# Patient Record
Sex: Male | Born: 1985 | Race: White | Hispanic: No | Marital: Single | Smoking: Never smoker
Health system: Southern US, Community
[De-identification: ages and names within clinical notes are randomized; demographics above are authoritative.]

## PROBLEM LIST (undated history)

## (undated) DIAGNOSIS — Z8619 Personal history of other infectious and parasitic diseases: Secondary | ICD-10-CM

## (undated) DIAGNOSIS — K921 Melena: Secondary | ICD-10-CM

## (undated) DIAGNOSIS — R1084 Generalized abdominal pain: Secondary | ICD-10-CM

## (undated) DIAGNOSIS — F419 Anxiety disorder, unspecified: Secondary | ICD-10-CM

## (undated) DIAGNOSIS — A0811 Acute gastroenteropathy due to Norwalk agent: Secondary | ICD-10-CM

## (undated) DIAGNOSIS — F329 Major depressive disorder, single episode, unspecified: Secondary | ICD-10-CM

## (undated) DIAGNOSIS — K604 Rectal fistula, unspecified: Secondary | ICD-10-CM

## (undated) DIAGNOSIS — D509 Iron deficiency anemia, unspecified: Secondary | ICD-10-CM

## (undated) DIAGNOSIS — K5090 Crohn's disease, unspecified, without complications: Secondary | ICD-10-CM

## (undated) DIAGNOSIS — A419 Sepsis, unspecified organism: Secondary | ICD-10-CM

## (undated) DIAGNOSIS — K8020 Calculus of gallbladder without cholecystitis without obstruction: Secondary | ICD-10-CM

## (undated) DIAGNOSIS — F32A Depression, unspecified: Secondary | ICD-10-CM

## (undated) DIAGNOSIS — J45909 Unspecified asthma, uncomplicated: Secondary | ICD-10-CM

## (undated) DIAGNOSIS — K824 Cholesterolosis of gallbladder: Secondary | ICD-10-CM

## (undated) DIAGNOSIS — K611 Rectal abscess: Secondary | ICD-10-CM

## (undated) HISTORY — DX: Crohn's disease, unspecified, without complications: K50.90

## (undated) HISTORY — DX: Iron deficiency anemia, unspecified: D50.9

## (undated) HISTORY — DX: Depression, unspecified: F32.A

## (undated) HISTORY — PX: COLON SURGERY: SHX602

## (undated) HISTORY — DX: Generalized abdominal pain: R10.84

## (undated) HISTORY — DX: Cholesterolosis of gallbladder: K82.4

## (undated) HISTORY — DX: Major depressive disorder, single episode, unspecified: F32.9

## (undated) HISTORY — DX: Personal history of other infectious and parasitic diseases: Z86.19

## (undated) HISTORY — DX: Acute gastroenteropathy due to Norwalk agent: A08.11

## (undated) HISTORY — PX: WRIST SURGERY: SHX841

## (undated) HISTORY — DX: Melena: K92.1

## (undated) HISTORY — PX: COLON RESECTION: SHX5231

## (undated) HISTORY — DX: Calculus of gallbladder without cholecystitis without obstruction: K80.20

## (undated) HISTORY — PX: OTHER SURGICAL HISTORY: SHX169

## (undated) HISTORY — DX: Rectal abscess: K61.1

## (undated) HISTORY — DX: Sepsis, unspecified organism: A41.9

---

## 2008-03-03 ENCOUNTER — Inpatient Hospital Stay: Payer: Self-pay | Admitting: Specialist

## 2008-03-20 ENCOUNTER — Ambulatory Visit: Payer: Self-pay | Admitting: Gastroenterology

## 2008-09-08 ENCOUNTER — Ambulatory Visit: Payer: Self-pay | Admitting: Internal Medicine

## 2008-09-13 ENCOUNTER — Ambulatory Visit: Payer: Self-pay | Admitting: Internal Medicine

## 2008-10-09 ENCOUNTER — Ambulatory Visit: Payer: Self-pay | Admitting: Internal Medicine

## 2008-11-08 ENCOUNTER — Ambulatory Visit: Payer: Self-pay | Admitting: Internal Medicine

## 2008-12-09 ENCOUNTER — Ambulatory Visit: Payer: Self-pay | Admitting: Internal Medicine

## 2009-01-14 ENCOUNTER — Ambulatory Visit: Payer: Self-pay | Admitting: Gastroenterology

## 2009-01-15 ENCOUNTER — Ambulatory Visit: Payer: Self-pay | Admitting: Gastroenterology

## 2009-01-27 ENCOUNTER — Ambulatory Visit: Payer: Self-pay | Admitting: Gastroenterology

## 2009-04-15 ENCOUNTER — Emergency Department: Payer: Self-pay | Admitting: Emergency Medicine

## 2009-04-25 ENCOUNTER — Emergency Department: Payer: Self-pay | Admitting: Emergency Medicine

## 2009-09-17 ENCOUNTER — Ambulatory Visit: Payer: Self-pay | Admitting: Gastroenterology

## 2009-09-19 LAB — PATHOLOGY REPORT

## 2009-10-16 ENCOUNTER — Inpatient Hospital Stay: Payer: Self-pay | Admitting: Internal Medicine

## 2009-12-02 ENCOUNTER — Ambulatory Visit: Payer: Self-pay | Admitting: Surgery

## 2009-12-08 ENCOUNTER — Inpatient Hospital Stay: Payer: Self-pay | Admitting: Surgery

## 2010-05-30 ENCOUNTER — Inpatient Hospital Stay: Payer: Self-pay | Admitting: Surgery

## 2010-06-29 ENCOUNTER — Ambulatory Visit: Payer: Self-pay | Admitting: Gastroenterology

## 2010-07-01 LAB — PATHOLOGY REPORT

## 2010-07-19 ENCOUNTER — Inpatient Hospital Stay: Payer: Self-pay | Admitting: Unknown Physician Specialty

## 2011-01-14 ENCOUNTER — Emergency Department: Payer: Self-pay | Admitting: Emergency Medicine

## 2011-04-20 ENCOUNTER — Emergency Department: Payer: Self-pay | Admitting: Emergency Medicine

## 2011-04-20 LAB — COMPREHENSIVE METABOLIC PANEL
Albumin: 4.6 g/dL (ref 3.4–5.0)
Anion Gap: 12 (ref 7–16)
Chloride: 106 mmol/L (ref 98–107)
Co2: 24 mmol/L (ref 21–32)
EGFR (African American): >60
EGFR (Non-African Amer.): >60
Osmolality: 283 (ref 275–301)
Potassium: 4.2 mmol/L (ref 3.5–5.1)
SGOT(AST): 21 U/L (ref 15–37)
SGPT (ALT): 17 U/L
Sodium: 142 mmol/L (ref 136–145)
Total Protein: 7.9 g/dL (ref 6.4–8.2)

## 2011-04-20 LAB — URINALYSIS, COMPLETE
Bacteria: NONE SEEN
Blood: NEGATIVE
Nitrite: NEGATIVE
Ph: 6 (ref 4.5–8.0)
Protein: NEGATIVE
RBC,UR: <1 /HPF (ref 0–5)
Specific Gravity: 1.029 (ref 1.003–1.030)
WBC UR: 1 /HPF (ref 0–5)

## 2011-04-20 LAB — CBC
HCT: 47.3 % (ref 40.0–52.0)
MCH: 31 pg (ref 26.0–34.0)
MCHC: 34.5 g/dL (ref 32.0–36.0)
MCV: 90 fL (ref 80–100)
Platelet: 187 10*3/uL (ref 150–440)
RBC: 5.27 10*6/uL (ref 4.40–5.90)
RDW: 11.9 % (ref 11.5–14.5)
WBC: 8.8 10*3/uL (ref 3.8–10.6)

## 2012-05-26 ENCOUNTER — Emergency Department: Payer: Self-pay | Admitting: Emergency Medicine

## 2012-05-26 LAB — CBC
HGB: 9 g/dL — ABNORMAL LOW (ref 13.0–18.0)
MCV: 69 fL — ABNORMAL LOW (ref 80–100)
RBC: 4.21 10*6/uL — ABNORMAL LOW (ref 4.40–5.90)
RDW: 15.4 % — ABNORMAL HIGH (ref 11.5–14.5)

## 2012-06-29 ENCOUNTER — Other Ambulatory Visit: Payer: Self-pay | Admitting: Family

## 2012-06-29 LAB — CBC WITH DIFFERENTIAL/PLATELET
Basophil #: 0.1 10*3/uL (ref 0.0–0.1)
Basophil %: 1.1 %
Eosinophil #: 0.3 10*3/uL (ref 0.0–0.7)
Eosinophil %: 5.7 %
HGB: 12.3 g/dL — ABNORMAL LOW (ref 13.0–18.0)
MCH: 24.1 pg — ABNORMAL LOW (ref 26.0–34.0)
MCHC: 31.8 g/dL — ABNORMAL LOW (ref 32.0–36.0)
MCV: 76 fL — ABNORMAL LOW (ref 80–100)
Monocyte #: 0.6 x10 3/mm (ref 0.2–1.0)
Neutrophil %: 70.9 %
Platelet: 235 10*3/uL (ref 150–440)
RDW: 26.7 % — ABNORMAL HIGH (ref 11.5–14.5)
WBC: 6 10*3/uL (ref 3.8–10.6)

## 2012-06-29 LAB — IRON AND TIBC
Iron Bind.Cap.(Total): 443 ug/dL (ref 250–450)
Iron Saturation: 62 %
Unbound Iron-Bind.Cap.: 167 ug/dL

## 2012-07-20 ENCOUNTER — Ambulatory Visit: Payer: Self-pay | Admitting: Gastroenterology

## 2013-03-14 ENCOUNTER — Emergency Department: Payer: Self-pay | Admitting: Emergency Medicine

## 2013-03-14 LAB — URINALYSIS, COMPLETE
Bilirubin,UR: NEGATIVE
Blood: NEGATIVE
GLUCOSE, UR: NEGATIVE mg/dL (ref 0–75)
LEUKOCYTE ESTERASE: NEGATIVE
NITRITE: NEGATIVE
PROTEIN: NEGATIVE
Ph: 5 (ref 4.5–8.0)
RBC,UR: 2 /HPF (ref 0–5)
SPECIFIC GRAVITY: 1.019 (ref 1.003–1.030)
Squamous Epithelial: NONE SEEN

## 2013-03-14 LAB — COMPREHENSIVE METABOLIC PANEL
ANION GAP: 4 — AB (ref 7–16)
AST: 27 U/L (ref 15–37)
Albumin: 4.7 g/dL (ref 3.4–5.0)
Alkaline Phosphatase: 107 U/L
BUN: 12 mg/dL (ref 7–18)
Bilirubin,Total: 1.5 mg/dL — ABNORMAL HIGH (ref 0.2–1.0)
CHLORIDE: 106 mmol/L (ref 98–107)
CO2: 26 mmol/L (ref 21–32)
Calcium, Total: 9.6 mg/dL (ref 8.5–10.1)
Creatinine: 0.94 mg/dL (ref 0.60–1.30)
Glucose: 115 mg/dL — ABNORMAL HIGH (ref 65–99)
OSMOLALITY: 273 (ref 275–301)
POTASSIUM: 3.6 mmol/L (ref 3.5–5.1)
SGPT (ALT): 24 U/L (ref 12–78)
Sodium: 136 mmol/L (ref 136–145)
Total Protein: 8.6 g/dL — ABNORMAL HIGH (ref 6.4–8.2)

## 2013-03-14 LAB — CBC WITH DIFFERENTIAL/PLATELET
Basophil #: 0.1 10*3/uL (ref 0.0–0.1)
Basophil %: 0.2 %
Eosinophil #: 0.2 10*3/uL (ref 0.0–0.7)
Eosinophil %: 0.7 %
HCT: 49.2 % (ref 40.0–52.0)
HGB: 17.1 g/dL (ref 13.0–18.0)
LYMPHS ABS: 0.1 10*3/uL — AB (ref 1.0–3.6)
LYMPHS PCT: 0.4 %
MCH: 30.6 pg (ref 26.0–34.0)
MCHC: 34.6 g/dL (ref 32.0–36.0)
MCV: 88 fL (ref 80–100)
Monocyte #: 1.2 x10 3/mm — ABNORMAL HIGH (ref 0.2–1.0)
Monocyte %: 5.2 %
Neutrophil #: 21.4 10*3/uL — ABNORMAL HIGH (ref 1.4–6.5)
Neutrophil %: 93.5 %
Platelet: 231 10*3/uL (ref 150–440)
RBC: 5.57 10*6/uL (ref 4.40–5.90)
RDW: 13.7 % (ref 11.5–14.5)
WBC: 22.9 10*3/uL — AB (ref 3.8–10.6)

## 2013-03-14 LAB — LIPASE, BLOOD: LIPASE: 140 U/L (ref 73–393)

## 2013-06-06 ENCOUNTER — Emergency Department: Payer: Self-pay | Admitting: Emergency Medicine

## 2013-06-08 LAB — BETA STREP CULTURE(ARMC)

## 2013-07-26 ENCOUNTER — Ambulatory Visit: Payer: Self-pay | Admitting: Gastroenterology

## 2013-07-27 LAB — PATHOLOGY REPORT

## 2013-12-24 ENCOUNTER — Emergency Department: Payer: Self-pay | Admitting: Emergency Medicine

## 2013-12-24 LAB — CBC
HCT: 43.3 % (ref 40.0–52.0)
HGB: 14.8 g/dL (ref 13.0–18.0)
MCH: 30.7 pg (ref 26.0–34.0)
MCHC: 34.2 g/dL (ref 32.0–36.0)
MCV: 90 fL (ref 80–100)
Platelet: 205 10*3/uL (ref 150–440)
RBC: 4.82 10*6/uL (ref 4.40–5.90)
RDW: 14.3 % (ref 11.5–14.5)
WBC: 9.2 10*3/uL (ref 3.8–10.6)

## 2013-12-24 LAB — COMPREHENSIVE METABOLIC PANEL
AST: 22 U/L (ref 15–37)
Albumin: 3.9 g/dL (ref 3.4–5.0)
Alkaline Phosphatase: 84 U/L
Anion Gap: 8 (ref 7–16)
BUN: 7 mg/dL (ref 7–18)
Bilirubin,Total: 1.6 mg/dL — ABNORMAL HIGH (ref 0.2–1.0)
CALCIUM: 8.2 mg/dL — AB (ref 8.5–10.1)
CHLORIDE: 106 mmol/L (ref 98–107)
CREATININE: 0.93 mg/dL (ref 0.60–1.30)
Co2: 26 mmol/L (ref 21–32)
EGFR (African American): >60
EGFR (Non-African Amer.): >60
Glucose: 84 mg/dL (ref 65–99)
Osmolality: 277 (ref 275–301)
Potassium: 3.6 mmol/L (ref 3.5–5.1)
SGPT (ALT): 21 U/L
Sodium: 140 mmol/L (ref 136–145)
TOTAL PROTEIN: 7.3 g/dL (ref 6.4–8.2)

## 2014-06-01 NOTE — Consult Note (Signed)
Brief Consult Note: Diagnosis: rt buttock fistula. crohn's disease.   Patient was seen by consultant.   Recommend further assessment or treatment.   Discussed with Attending MD.   Comments: No fluctuance, no erythema, min tenderness rt buttock over old fistula scar. Rec abx for now, may need I&D in 2-3 days if fails to resolve.  Electronic Signatures: Florene Glen (MD)  (Signed [id number] 16:01)  Authored: Brief Consult Note   Last Updated: 16-Nov-15 16:01 by Florene Glen (MD)

## 2014-06-21 ENCOUNTER — Emergency Department
Admission: EM | Admit: 2014-06-21 | Discharge: 2014-06-21 | Disposition: A | Discharge status: Home / Self Care | Payer: BLUE CROSS/BLUE SHIELD | Attending: Emergency Medicine | Admitting: Emergency Medicine

## 2014-06-21 ENCOUNTER — Encounter: Payer: Self-pay | Admitting: Emergency Medicine

## 2014-06-21 DIAGNOSIS — B029 Zoster without complications: Secondary | ICD-10-CM | POA: Diagnosis not present

## 2014-06-21 DIAGNOSIS — R21 Rash and other nonspecific skin eruption: Secondary | ICD-10-CM | POA: Diagnosis present

## 2014-06-21 HISTORY — DX: Crohn's disease, unspecified, without complications: K50.90

## 2014-06-21 MED ORDER — ACYCLOVIR 800 MG PO TABS: 800.0000 mg | ORAL_TABLET | Freq: Every day | ORAL | Status: DC

## 2014-06-21 MED ORDER — TRAMADOL HCL 50 MG PO TABS: 50.0000 mg | ORAL_TABLET | Freq: Three times a day (TID) | ORAL | Status: DC | PRN

## 2014-06-21 MED ORDER — PREDNISONE 10 MG (21) PO TBPK: 10.0000 mg | ORAL_TABLET | Freq: Every day | ORAL | Status: DC

## 2014-06-21 NOTE — ED Provider Notes (Signed)
[facility] Emergency Department Provider Note  ____________________________________________  Time seen: Approximately 1:21 PM  I have reviewed the triage vital signs and the nursing notes.   HISTORY  Chief Complaint Rash  HPI Leroy Hernandez is a 29 y.o. male presents to the ER with complaints of rash to right face. Patient states began approximate 4-5 days ago with small clustered area. States has gradually progressed and spread on face. Patient denies itching but states that it is painful. States pain at 4 out of 10 described as aching.  Described as "feels like a bruise" and it burns. Patient states that he came to the ER today as it has worsened and closer to his eye. States concerned it may be in his eye as right eye "feels irritated." Denies changes in vision, eye pain, eye discharge. Denies changes in lotions, chemicals, medications, detergents or other contacts. Denies potential for foreign body to right eye.   Denies fevers, sore throat, mouth soreness, shortness of breath, chest pain or abdominal pain.  Past Medical History  Diagnosis Date  . Crohn's disease   States no prednisone in last year.  There are no active problems to display for this patient.   History reviewed. No pertinent past surgical history.  No current outpatient medications to display for this patient. Allergies Review of patient's allergies indicates no known allergies.  History reviewed. No pertinent family history.  Social History History  Substance Use Topics  . Smoking status: Never Smoker   . Smokeless tobacco: Not on file  . Alcohol Use: No    Review of Systems Constitutional: No fever/chills Eyes: No visual changes. ENT: No sore throat. Cardiovascular: Denies chest pain. Respiratory: Denies shortness of breath. Gastrointestinal: No abdominal pain.  No nausea, no vomiting.  No diarrhea.  No constipation. Genitourinary: Negative for dysuria. Musculoskeletal:  Negative for back pain. Skin: Positive for rash Neurological: Negative for headaches, focal weakness or numbness.  10-point ROS otherwise negative.  ____________________________________________   PHYSICAL EXAM:  VITAL SIGNS: ED Triage Vitals  Enc Vitals Group     BP 06/21/14 1124 122/73 mmHg     Pulse Rate 06/21/14 1124 93     Resp 06/21/14 1124 18     Temp 06/21/14 1124 98.4 F (36.9 C)     Temp Source 06/21/14 1124 Oral     SpO2 06/21/14 1124 99 %     Weight 06/21/14 1124 130 lb (58.968 kg)     Height 06/21/14 1124 5' 5"  (1.651 m)     Head Cir --      Peak Flow --      Pain Score 06/21/14 1124 4     Pain Loc --      Pain Edu? --      Excl. in [place]? --    Visual acuity completed by RN:  Right 20/100 Left:20/50  Constitutional: Alert and oriented. Well appearing and in no acute distress. Eyes: Conjunctivae are normal. PERRL. EOMI. No drainage or erythema.  Head: Atraumatic. Nose: No congestion/rhinnorhea. Mouth/Throat: Mucous membranes are moist.  Oropharynx non-erythematous. Neck: No stridor.  No cervical spine tenderness to palpation Hematological/Lymphatic/Immunilogical: No cervical lymphadenopathy. Cardiovascular: Normal rate, regular rhythm. Grossly normal heart sounds.  Good peripheral circulation. Respiratory: Normal respiratory effort.  No retractions. Lungs CTAB. Gastrointestinal: Soft and nontender. No distention. No abdominal bruits. No CVA tenderness. Musculoskeletal: No lower extremity tenderness nor edema.  No joint effusions. Neurologic:  Normal speech and language. No gross focal neurologic deficits are appreciated. Speech  is normal. No gait instability. Skin:  Unilateral vesicular mildly erythematous clustered rash present to right forhead with scattered vesicles at right eyebrow. Minimally tender to palpation.   No lesions noted to eyelid or below right eye. No rash present to left face. No other rash noted. No surrounding erythema. No induration or  fluctuance. Psychiatric: Mood and affect are normal. Speech and behavior are normal.  _________________________________________   INITIAL IMPRESSION / ASSESSMENT AND PLAN / ED COURSE  Pertinent labs & imaging results that were available during my care of the patient were reviewed by me and considered in my medical decision making (see chart for details).  Patient presents to the ER with unilateral right facial rash present 4-5 days. Denies changes or recent exposures. Rash appearance consistent with shingles. No signs of secondary infection. Will treat with oral prednisone, acyclovir and tramadol as needed for pain. Discussed keeping area clean. Patient to follow up with his PCP next week, Pt agreed to plan.   Due to concern for potential ophthalmic herpes zoster, Discussed patient's care with Dr. Oval Linsey ophthalmology at 1320 pm. Dr. Oval Linsey said that she will see patient this afternoon and to send patient directly over to her office at this time. Patient agreed to plan and verbalizes he will go directly to ophthalmology office.   Discussed follow up and return parameters.  ___________________________________________   FINAL CLINICAL IMPRESSION(S) / ED DIAGNOSES  Final diagnoses:  Shingles  Right Face   Marylene Land, NP 06/21/14 1556  Harvest Dark, MD 06/23/14 1217

## 2014-06-21 NOTE — Discharge Instructions (Signed)
As discussed take medication as prescribed. Avoid rubbing or scratching as to prevent secondary infection. Keep hands clean and face clean.  Go directly to ophthalmology today see above for address and phone number. This is very important.  Follow-up the primary care physician next week for the above.  Return to the ER for new or worsening concerns.  Shingles Shingles is caused by the same virus that causes chickenpox. The first feelings may be pain or tingling. A rash will follow in a couple days. The rash may occur on any area of the body. Long-lasting pain is more likely in an elderly person. It can last months to years. There are medicines that can help prevent pain if you start taking them early. HOME CARE   Take cool baths or place cool cloths on the rash as told by your doctor.  Take medicine only as told by your doctor.  Rest as told by your doctor.  Keep your rash clean with mild soap and cool water or as told by your doctor.  Do not scratch your rash. You may use calamine lotion to relieve itchy skin as told by your doctor.  Keep your rash covered with a loose bandage (dressing).  Avoid touching:  Babies.  Pregnant women.  Children with inflamed skin (eczema).  People who have gotten organ transplants.  People with chronic illnesses, such as leukemia or AIDS.  Wear loose-fitting clothing.  If the rash is on the face, you may need to see a specialist. Keep all appointments. Shingles must be kept away from the eyes, if possible.  Keep all follow-up visits as told by your doctor. GET HELP RIGHT AWAY IF:   You have any pain on the face or eye.  You lose feeling on one side of your face.  You have ear pain or ringing in your ear.  You cannot taste as well.  Your medicines do not help the pain.  Your redness or puffiness (swelling) spreads.  You feel like you are getting worse.  You have a fever. MAKE SURE YOU:   Understand these instructions.  Will  watch your condition.  Will get help right away if you are not doing well or get worse. Document Released: 07/14/2007 Document Revised: 06/11/2013 Document Reviewed: 07/14/2007 [facility] Patient Information 2015 [place], [place]. This information is not intended to replace advice given to you by your health care provider. Make sure you discuss any questions you have with your health care provider.

## 2014-06-21 NOTE — ED Notes (Signed)
C/O painful raised rash to top of head that is moving down to right side of face, states he has had rash x 4 days, denies any itching

## 2014-09-15 ENCOUNTER — Encounter: Payer: Self-pay | Admitting: Emergency Medicine

## 2014-09-15 ENCOUNTER — Emergency Department
Admission: EM | Admit: 2014-09-15 | Discharge: 2014-09-15 | Disposition: A | Discharge status: Home / Self Care | Payer: BLUE CROSS/BLUE SHIELD | Attending: Emergency Medicine | Admitting: Emergency Medicine

## 2014-09-15 ENCOUNTER — Emergency Department: Payer: BLUE CROSS/BLUE SHIELD

## 2014-09-15 DIAGNOSIS — K5090 Crohn's disease, unspecified, without complications: Secondary | ICD-10-CM | POA: Diagnosis not present

## 2014-09-15 DIAGNOSIS — R195 Other fecal abnormalities: Secondary | ICD-10-CM | POA: Diagnosis present

## 2014-09-15 DIAGNOSIS — K50119 Crohn's disease of large intestine with unspecified complications: Secondary | ICD-10-CM

## 2014-09-15 DIAGNOSIS — Z79899 Other long term (current) drug therapy: Secondary | ICD-10-CM | POA: Insufficient documentation

## 2014-09-15 HISTORY — DX: Rectal fistula, unspecified: K60.40

## 2014-09-15 HISTORY — DX: Unspecified asthma, uncomplicated: J45.909

## 2014-09-15 HISTORY — DX: Rectal fistula: K60.4

## 2014-09-15 LAB — COMPREHENSIVE METABOLIC PANEL
ALBUMIN: 4 g/dL (ref 3.5–5.0)
ALT: 13 U/L — ABNORMAL LOW (ref 17–63)
ANION GAP: 4 — AB (ref 5–15)
AST: 17 U/L (ref 15–41)
Alkaline Phosphatase: 85 U/L (ref 38–126)
BUN: 5 mg/dL — ABNORMAL LOW (ref 6–20)
CO2: 28 mmol/L (ref 22–32)
Calcium: 8.8 mg/dL — ABNORMAL LOW (ref 8.9–10.3)
Chloride: 109 mmol/L (ref 101–111)
Creatinine, Ser: 0.79 mg/dL (ref 0.61–1.24)
GFR calc non Af Amer: >60 mL/min (ref >60)
Glucose, Bld: 84 mg/dL (ref 65–99)
Potassium: 3.8 mmol/L (ref 3.5–5.1)
Sodium: 141 mmol/L (ref 135–145)
Total Bilirubin: 0.6 mg/dL (ref 0.3–1.2)
Total Protein: 7 g/dL (ref 6.5–8.1)

## 2014-09-15 LAB — CBC
HCT: 42.5 % (ref 40.0–52.0)
HEMOGLOBIN: 15 g/dL (ref 13.0–18.0)
MCH: 30.3 pg (ref 26.0–34.0)
MCHC: 35.2 g/dL (ref 32.0–36.0)
MCV: 85.9 fL (ref 80.0–100.0)
PLATELETS: 232 10*3/uL (ref 150–440)
RBC: 4.95 MIL/uL (ref 4.40–5.90)
RDW: 12.4 % (ref 11.5–14.5)
WBC: 6.7 10*3/uL (ref 3.8–10.6)

## 2014-09-15 LAB — TYPE AND SCREEN
ABO/RH(D): O NEG
Antibody Screen: NEGATIVE

## 2014-09-15 LAB — ABO/RH: ABO/RH(D): O NEG

## 2014-09-15 MED ORDER — METRONIDAZOLE 500 MG PO TABS
500.0000 mg | ORAL_TABLET | Freq: Three times a day (TID) | ORAL | Status: DC
Start: 2014-09-15 — End: 2015-06-23

## 2014-09-15 MED ORDER — PREDNISONE 20 MG PO TABS: 40.0000 mg | ORAL_TABLET | Freq: Every day | ORAL | Status: DC

## 2014-09-15 MED ORDER — MORPHINE SULFATE 4 MG/ML IJ SOLN
4.0000 mg | Freq: Once | INTRAMUSCULAR | Status: AC
  Administered 2014-09-15: 4 mg via INTRAVENOUS
  Filled 2014-09-15: qty 1

## 2014-09-15 MED ORDER — ONDANSETRON HCL 4 MG/2ML IJ SOLN
4.0000 mg | Freq: Once | INTRAMUSCULAR | Status: AC
  Administered 2014-09-15: 4 mg via INTRAVENOUS
  Filled 2014-09-15: qty 2

## 2014-09-15 MED ORDER — SODIUM CHLORIDE 0.9 % IV BOLUS (SEPSIS)
1000.0000 mL | Freq: Once | INTRAVENOUS | Status: AC
  Administered 2014-09-15: 1000 mL via INTRAVENOUS

## 2014-09-15 MED ORDER — PREDNISONE 20 MG PO TABS
40.0000 mg | ORAL_TABLET | Freq: Once | ORAL | Status: AC
  Administered 2014-09-15: 40 mg via ORAL
  Filled 2014-09-15: qty 2

## 2014-09-15 MED ORDER — TRAMADOL HCL 50 MG PO TABS: 50.0000 mg | ORAL_TABLET | Freq: Four times a day (QID) | ORAL | Status: DC | PRN

## 2014-09-15 MED ORDER — IOHEXOL 240 MG/ML SOLN
50.0000 mL | Freq: Once | INTRAMUSCULAR | Status: AC | PRN
  Administered 2014-09-15: 50 mL via ORAL

## 2014-09-15 MED ORDER — IOHEXOL 350 MG/ML SOLN
100.0000 mL | Freq: Once | INTRAVENOUS | Status: AC | PRN
  Administered 2014-09-15: 100 mL via INTRAVENOUS

## 2014-09-15 MED ORDER — METRONIDAZOLE 500 MG PO TABS
500.0000 mg | ORAL_TABLET | Freq: Once | ORAL | Status: AC
  Administered 2014-09-15: 500 mg via ORAL
  Filled 2014-09-15: qty 1

## 2014-09-15 NOTE — ED Notes (Addendum)
Pt reports frequent defecation, reports episodes of diarrhea and bloody stools (bright red blood). Pt reports rectal pain. Pt reports abdomen is tender. Pt with hx of chron's disease, last attack was 2 years ago. 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

## 2014-09-15 NOTE — Discharge Instructions (Signed)
Crohn Disease Crohn disease is a long-term (chronic) soreness and redness (inflammation) of the intestines (bowel). It can affect any portion of the digestive tract, from the mouth to the anus. It can also cause problems outside the digestive tract. Crohn disease is closely related to a disease called ulcerative colitis (together, these two diseases are called inflammatory bowel disease).  CAUSES  The cause of Crohn disease is not known. One Link Snuffer is that, in an easily affected person, the immune system is triggered to attack the body's own digestive tissue. Crohn disease runs in families. It seems to be more common in certain geographic areas and amongst certain races. There are no clear-cut dietary causes.  SYMPTOMS  Crohn disease can cause many different symptoms since it can affect many different parts of the body. Symptoms include:  Fatigue.  Weight loss.  Chronic diarrhea, sometime bloody.  Abdominal pain and cramps.  Fever.  Ulcers or canker sores in the mouth or rectum.  Anemia (low red blood cells).  Arthritis, skin problems, and eye problems may occur. Complications of Crohn disease can include:  Series of holes (perforation) of the bowel.  Portions of the intestines sticking to each other (adhesions).  Obstruction of the bowel.  Fistula formation, typically in the rectal area but also in other areas. A fistula is an opening between the bowels and the outside, or between the bowels and another organ.  A painful crack in the mucous membrane of the anus (rectal fissure). DIAGNOSIS  Your caregiver may suspect Crohn disease based on your symptoms and an exam. Blood tests may confirm that there is a problem. You may be asked to submit a stool specimen for examination. X-rays and CT scans may be necessary. Ultimately, the diagnosis is usually made after a procedure that uses a flexible tube that is inserted via your mouth or your anus. This is done under sedation and is called  either an upper endoscopy or colonoscopy. With these tests, the specialist can take tiny tissue samples and remove them from the inside of the bowel (biopsy). Examination of this biopsy tissue under a microscope can reveal Crohn disease as the cause of your symptoms. Due to the many different forms that Crohn disease can take, symptoms may be present for several years before a diagnosis is made. TREATMENT  Medications are often used to decrease inflammation and control the immune system. These include medicines related to aspirin, steroid medications, and newer and stronger medications to slow down the immune system. Some medications may be used as suppositories or enemas. A number of other medications are used or have been studied. Your caregiver will make specific recommendations. HOME CARE INSTRUCTIONS   Symptoms such as diarrhea can be controlled with medications. Avoid foods that have a laxative effect such as fresh fruit, vegetables, and dairy products. During flare-ups, you can rest your bowel by refraining from solid foods. Drink clear liquids frequently during the day. (Electrolyte or rehydrating fluids are best. Your caregiver can help you with suggestions.) Drink often to prevent loss of body fluids (dehydration). When diarrhea has cleared, eat small meals and more frequently. Avoid food additives and stimulants such as caffeine (coffee, tea, or chocolate). Enzyme supplements may help if you develop intolerance to a sugar in dairy products (lactose). Ask your caregiver or dietitian about specific dietary instructions.  Try to maintain a positive attitude. Learn relaxation techniques such as self-hypnosis, mental imaging, and muscle relaxation.  If possible, avoid stresses which can aggravate your condition.  Exercise  regularly.  Follow your diet.  Always get plenty of rest. SEEK MEDICAL CARE IF:   Your symptoms fail to improve after a week or two of new treatment.  You experience  continued weight loss.  You have ongoing cramps or loose bowels.  You develop a new skin rash, skin sores, or eye problems. SEEK IMMEDIATE MEDICAL CARE IF:   You have worsening of your symptoms or develop new symptoms.  You have a fever.  You develop bloody diarrhea.  You develop severe abdominal pain. MAKE SURE YOU:   Understand these instructions.  Will watch your condition.  Will get help right away if you are not doing well or get worse. Document Released: 11/04/2004 Document Revised: 06/11/2013 Document Reviewed: 10/03/2006 [facility] Patient Information 2015 [place], [place]. This information is not intended to replace advice given to you by your health care provider. Make sure you discuss any questions you have with your health care provider.

## 2014-09-15 NOTE — ED Provider Notes (Signed)
[facility] Emergency Department Provider Note  ____________________________________________  Time seen: Approximately 3 PM  I have reviewed the triage vital signs and the nursing notes.   HISTORY  Chief Complaint Blood In Stools and Abscess    HPI Leroy Hernandez is a 29 y.o. male with a history of Crohn's disease with bowel resection presents today with 1 week of worsening abdominal pain with blood in his stool. He says that the abdominal pain is intermittent and cramping and over the umbilicus. He denies any nausea or vomiting. States he is having loose stools which are brown with blood mixed in with them. He also says that on his right buttock near the rectum. He feels like he is having re-collection of an abscess. He says that he has had this abscess drained multiple times. Says that he usually picks at it and drained it in the shower. However, he has not been able to drain at this time.Patient says supposed to be on Humira shots but has not been getting them over the past month. Sees Dr. Candace Cruise.   Past Medical History  Diagnosis Date  . Crohn's disease   . Rectal fistula   . Asthma     There are no active problems to display for this patient.   Past Surgical History  Procedure Laterality Date  . Rectal fistula surgery    . Wrist surgery    . Colon surgery    . Colon resection      Current Outpatient Rx  Name  Route  Sig  Dispense  Refill  . acyclovir (ZOVIRAX) 800 MG tablet   Oral   Take 1 tablet (800 mg total) by mouth 5 (five) times daily. For 10 days   50 tablet   0   . predniSONE (STERAPRED UNI-PAK 21 TAB) 10 MG (21) TBPK tablet   Oral   Take 1 tablet (10 mg total) by mouth daily. 6 pills day one, then taper down by 10 mg daily for 6 days. Day one 6, day two 5, day three 4, day four 3, day five, 2, day six 1   21 tablet   0   . traMADol (ULTRAM) 50 MG tablet   Oral   Take 1 tablet (50 mg total) by mouth every 8 (eight) hours as  needed (Do not drive or operate machinery while taking as can cause drowsiness.).   12 tablet   0     Allergies Review of patient's allergies indicates no known allergies.  No family history on file.  Social History History  Substance Use Topics  . Smoking status: Never Smoker   . Smokeless tobacco: Not on file  . Alcohol Use: No    Review of Systems Constitutional: No fever/chills Eyes: No visual changes. ENT: No sore throat. Cardiovascular: Denies chest pain. Respiratory: Denies shortness of breath. Gastrointestinal: No nausea, no vomiting.   No constipation. Genitourinary: Negative for dysuria. Musculoskeletal: Negative for back pain. Skin: Negative for rash. Neurological: Negative for headaches, focal weakness or numbness.  10-point ROS otherwise negative.  ____________________________________________   PHYSICAL EXAM:  VITAL SIGNS: ED Triage Vitals  Enc Vitals Group     BP 09/15/14 1149 129/76 mmHg     Pulse Rate 09/15/14 1149 94     Resp 09/15/14 1149 16     Temp 09/15/14 1149 98.1 F (36.7 C)     Temp Source 09/15/14 1149 Oral     SpO2 09/15/14 1149 99 %     Weight  09/15/14 1149 120 lb (54.432 kg)     Height 09/15/14 1149 5' 5"  (1.651 m)     Head Cir --      Peak Flow --      Pain Score 09/15/14 1151 4     Pain Loc --      Pain Edu? --      Excl. in [place]? --     Constitutional: Alert and oriented. Well appearing and in no acute distress. Eyes: Conjunctivae are normal. PERRL. EOMI. Head: Atraumatic. Nose: No congestion/rhinnorhea. Mouth/Throat: Mucous membranes are moist.  Oropharynx non-erythematous. Neck: No stridor.   Cardiovascular: Normal rate, regular rhythm. Grossly normal heart sounds.  Good peripheral circulation. Respiratory: Normal respiratory effort.  No retractions. Lungs CTAB. Gastrointestinal: Soft tenderness periumbilically. No rebound or guarding. No distention. No abdominal bruits. No CVA tenderness. Rectal exam with possible  early perianal abscess at 8:00. No induration or pus drainage. Rectal exam with grossly brown stool but weakly heme positive. Musculoskeletal: No lower extremity tenderness nor edema.  No joint effusions. Neurologic:  Normal speech and language. No gross focal neurologic deficits are appreciated. No gait instability. Skin:  Skin is warm, dry and intact. No rash noted. Psychiatric: Mood and affect are normal. Speech and behavior are normal.  ____________________________________________   LABS (all labs ordered are listed, but only abnormal results are displayed)  Labs Reviewed  COMPREHENSIVE METABOLIC PANEL - Abnormal; Notable for the following:    BUN 5 (*)    Calcium 8.8 (*)    ALT 13 (*)    Anion gap 4 (*)    All other components within normal limits  CBC  TYPE AND SCREEN  ABO/RH   ____________________________________________  EKG   ____________________________________________  RADIOLOGY  Prior right hemicolectomy. Bowel wall thickening and minimal adjacent mesenteric infiltration question active Crohn's disease. Fluid track with enhancing margins along the right dorsolateral aspect of the rectum. Sinus tract versus fistula. ____________________________________________   PROCEDURES    ____________________________________________   INITIAL IMPRESSION / ASSESSMENT AND PLAN / ED COURSE  Pertinent labs & imaging results that were available during my care of the patient were reviewed by me and considered in my medical decision making (see chart for details).  ----------------------------------------- 7:18 PM on 09/15/2014 -----------------------------------------  Discussed case with Dr. Gustavo Lah of the GI service. He recommends a steroid course as well as resuming of the patient's Humira. Also recommends Flagyl. Discussed further with the patient and he says that he is too busy to take his Humira. He also says that he is afraid of needles and doesn't like to stick  himself. I discussed this with him as well as his family, emphasizing how important it is that he take his medication to help prevent further flares. The patient says that he will resume taking his Humira. He says that he is a full supply home. He understands that he will also need to call for a follow point with with Dr. Servando Snare discharge to home. Pain controlled after IV medication. Patient resting comfortable upon the reevaluation. Discussed the CAT scan findings as well as lab results with the patient and his family. ____________________________________________   FINAL CLINICAL IMPRESSION(S) / ED DIAGNOSES  Acute Crohn's flare. Initial visit.    Orbie Pyo, MD 09/15/14 [phone]

## 2015-03-21 ENCOUNTER — Emergency Department: Payer: BC Managed Care – PPO

## 2015-03-21 ENCOUNTER — Encounter: Payer: Self-pay | Admitting: Emergency Medicine

## 2015-03-21 ENCOUNTER — Emergency Department
Admission: EM | Admit: 2015-03-21 | Discharge: 2015-03-21 | Disposition: A | Discharge status: Home / Self Care | Payer: BC Managed Care – PPO | Attending: Emergency Medicine | Admitting: Emergency Medicine

## 2015-03-21 DIAGNOSIS — R079 Chest pain, unspecified: Secondary | ICD-10-CM | POA: Diagnosis present

## 2015-03-21 DIAGNOSIS — F419 Anxiety disorder, unspecified: Secondary | ICD-10-CM | POA: Diagnosis not present

## 2015-03-21 DIAGNOSIS — F439 Reaction to severe stress, unspecified: Secondary | ICD-10-CM | POA: Insufficient documentation

## 2015-03-21 HISTORY — DX: Anxiety disorder, unspecified: F41.9

## 2015-03-21 LAB — BASIC METABOLIC PANEL
Anion gap: 6 (ref 5–15)
BUN: 9 mg/dL (ref 6–20)
CO2: 30 mmol/L (ref 22–32)
CREATININE: 1.02 mg/dL (ref 0.61–1.24)
Calcium: 9 mg/dL (ref 8.9–10.3)
Chloride: 104 mmol/L (ref 101–111)
GFR calc Af Amer: >60 mL/min (ref >60)
Glucose, Bld: 101 mg/dL — ABNORMAL HIGH (ref 65–99)
Potassium: 3.8 mmol/L (ref 3.5–5.1)
SODIUM: 140 mmol/L (ref 135–145)

## 2015-03-21 LAB — CBC
HCT: 44.4 % (ref 40.0–52.0)
Hemoglobin: 15.4 g/dL (ref 13.0–18.0)
MCH: 30 pg (ref 26.0–34.0)
MCHC: 34.6 g/dL (ref 32.0–36.0)
MCV: 86.5 fL (ref 80.0–100.0)
Platelets: 211 10*3/uL (ref 150–440)
RBC: 5.13 MIL/uL (ref 4.40–5.90)
RDW: 13.6 % (ref 11.5–14.5)
WBC: 6.5 10*3/uL (ref 3.8–10.6)

## 2015-03-21 LAB — TROPONIN I: Troponin I: <0.03 ng/mL (ref <0.031)

## 2015-03-21 MED ORDER — LORAZEPAM 0.5 MG PO TABS
0.5000 mg | ORAL_TABLET | Freq: Three times a day (TID) | ORAL | Status: DC | PRN
Start: 2015-03-21 — End: 2015-06-23

## 2015-03-21 MED ORDER — FAMOTIDINE 20 MG PO TABS: 20.0000 mg | ORAL_TABLET | Freq: Two times a day (BID) | ORAL | Status: DC

## 2015-03-21 NOTE — Discharge Instructions (Signed)
Nonspecific Chest Pain  Chest pain can be caused by many different conditions. There is always a chance that your pain could be related to something serious, such as a heart attack or a blood clot in your lungs. Chest pain can also be caused by conditions that are not life-threatening. If you have chest pain, it is very important to follow up with your health care provider. CAUSES  Chest pain can be caused by:  Heartburn.  Pneumonia or bronchitis.  Anxiety or stress.  Inflammation around your heart (pericarditis) or lung (pleuritis or pleurisy).  A blood clot in your lung.  A collapsed lung (pneumothorax). It can develop suddenly on its own (spontaneous pneumothorax) or from trauma to the chest.  Shingles infection (varicella-zoster virus).  Heart attack.  Damage to the bones, muscles, and cartilage that make up your chest wall. This can include:  Bruised bones due to injury.  Strained muscles or cartilage due to frequent or repeated coughing or overwork.  Fracture to one or more ribs.  Sore cartilage due to inflammation (costochondritis). RISK FACTORS  Risk factors for chest pain may include:  Activities that increase your risk for trauma or injury to your chest.  Respiratory infections or conditions that cause frequent coughing.  Medical conditions or overeating that can cause heartburn.  Heart disease or family history of heart disease.  Conditions or health behaviors that increase your risk of developing a blood clot.  Having had chicken pox (varicella zoster). SIGNS AND SYMPTOMS Chest pain can feel like:  Burning or tingling on the surface of your chest or deep in your chest.  Crushing, pressure, aching, or squeezing pain.  Dull or sharp pain that is worse when you move, cough, or take a deep breath.  Pain that is also felt in your back, neck, shoulder, or arm, or pain that spreads to any of these areas. Your chest pain may come and go, or it may stay  constant. DIAGNOSIS Lab tests or other studies may be needed to find the cause of your pain. Your health care provider may have you take a test called an ambulatory ECG (electrocardiogram). An ECG records your heartbeat patterns at the time the test is performed. You may also have other tests, such as:  Transthoracic echocardiogram (TTE). During echocardiography, sound waves are used to create a picture of all of the heart structures and to look at how blood flows through your heart.  Transesophageal echocardiogram (TEE).This is a more advanced imaging test that obtains images from inside your body. It allows your health care provider to see your heart in finer detail.  Cardiac monitoring. This allows your health care provider to monitor your heart rate and rhythm in real time.  Holter monitor. This is a portable device that records your heartbeat and can help to diagnose abnormal heartbeats. It allows your health care provider to track your heart activity for several days, if needed.  Stress tests. These can be done through exercise or by taking medicine that makes your heart beat more quickly.  Blood tests.  Imaging tests. TREATMENT  Your treatment depends on what is causing your chest pain. Treatment may include:  Medicines. These may include:  Acid blockers for heartburn.  Anti-inflammatory medicine.  Pain medicine for inflammatory conditions.  Antibiotic medicine, if an infection is present.  Medicines to dissolve blood clots.  Medicines to treat coronary artery disease.  Supportive care for conditions that do not require medicines. This may include:  Resting.  Applying heat  or cold packs to injured areas.  Limiting activities until pain decreases. HOME CARE INSTRUCTIONS  If you were prescribed an antibiotic medicine, finish it all even if you start to feel better.  Avoid any activities that bring on chest pain.  Do not use any tobacco products, including  cigarettes, chewing tobacco, or electronic cigarettes. If you need help quitting, ask your health care provider.  Do not drink alcohol.  Take medicines only as directed by your health care provider.  Keep all follow-up visits as directed by your health care provider. This is important. This includes any further testing if your chest pain does not go away.  If heartburn is the cause for your chest pain, you may be told to keep your head raised (elevated) while sleeping. This reduces the chance that acid will go from your stomach into your esophagus.  Make lifestyle changes as directed by your health care provider. These may include:  Getting regular exercise. Ask your health care provider to suggest some activities that are safe for you.  Eating a heart-healthy diet. A registered dietitian can help you to learn healthy eating options.  Maintaining a healthy weight.  Managing diabetes, if necessary.  Reducing stress. SEEK MEDICAL CARE IF:  Your chest pain does not go away after treatment.  You have a rash with blisters on your chest.  You have a fever. SEEK IMMEDIATE MEDICAL CARE IF:   Your chest pain is worse.  You have an increasing cough, or you cough up blood.  You have severe abdominal pain.  You have severe weakness.  You faint.  You have chills.  You have sudden, unexplained chest discomfort.  You have sudden, unexplained discomfort in your arms, back, neck, or jaw.  You have shortness of breath at any time.  You suddenly start to sweat, or your skin gets clammy.  You feel nauseous or you vomit.  You suddenly feel light-headed or dizzy.  Your heart begins to beat quickly, or it feels like it is skipping beats. These symptoms may represent a serious problem that is an emergency. Do not wait to see if the symptoms will go away. Get medical help right away. Call your local emergency services (911 in the [place] Do not drive yourself to the hospital.   This  information is not intended to replace advice given to you by your health care provider. Make sure you discuss any questions you have with your health care provider.   Document Released: 11/04/2004 Document Revised: 02/15/2014 Document Reviewed: 08/31/2013 Elsevier Interactive Patient Education 2016 Elsevier Inc.  Panic Attacks Panic attacks are sudden, short-livedsurges of severe anxiety, fear, or discomfort. They may occur for no reason when you are relaxed, when you are anxious, or when you are sleeping. Panic attacks may occur for a number of reasons:   Healthy people occasionally have panic attacks in extreme, life-threatening situations, such as war or natural disasters. Normal anxiety is a protective mechanism of the body that helps [place] react to danger (fight or flight response).  Panic attacks are often seen with anxiety disorders, such as panic disorder, social anxiety disorder, generalized anxiety disorder, and phobias. Anxiety disorders cause excessive or uncontrollable anxiety. They may interfere with your relationships or other life activities.  Panic attacks are sometimes seen with other mental illnesses, such as depression and posttraumatic stress disorder.  Certain medical conditions, prescription medicines, and drugs of abuse can cause panic attacks. SYMPTOMS  Panic attacks start suddenly, peak within 20 minutes, and are  accompanied by four or more of the following symptoms:  Pounding heart or fast heart rate (palpitations).  Sweating.  Trembling or shaking.  Shortness of breath or feeling smothered.  Feeling choked.  Chest pain or discomfort.  Nausea or strange feeling in your stomach.  Dizziness, light-headedness, or feeling like you will faint.  Chills or hot flushes.  Numbness or tingling in your lips or hands and feet.  Feeling that things are not real or feeling that you are not yourself.  Fear of losing control or going crazy.  Fear of dying. Some  of these symptoms can mimic serious medical conditions. For example, you may think you are having a heart attack. Although panic attacks can be very scary, they are not life threatening. DIAGNOSIS  Panic attacks are diagnosed through an assessment by your health care provider. Your health care provider will ask questions about your symptoms, such as where and when they occurred. Your health care provider will also ask about your medical history and use of alcohol and drugs, including prescription medicines. Your health care provider may order blood tests or other studies to rule out a serious medical condition. Your health care provider may refer you to a mental health professional for further evaluation. TREATMENT   Most healthy people who have one or two panic attacks in an extreme, life-threatening situation will not require treatment.  The treatment for panic attacks associated with anxiety disorders or other mental illness typically involves counseling with a mental health professional, medicine, or a combination of both. Your health care provider will help determine what treatment is best for you.  Panic attacks due to physical illness usually go away with treatment of the illness. If prescription medicine is causing panic attacks, talk with your health care provider about stopping the medicine, decreasing the dose, or substituting another medicine.  Panic attacks due to alcohol or drug abuse go away with abstinence. Some adults need professional help in order to stop drinking or using drugs. HOME CARE INSTRUCTIONS   Take all medicines as directed by your health care provider.   Schedule and attend follow-up visits as directed by your health care provider. It is important to keep all your appointments. SEEK MEDICAL CARE IF:  You are not able to take your medicines as prescribed.  Your symptoms do not improve or get worse. SEEK IMMEDIATE MEDICAL CARE IF:   You experience panic attack  symptoms that are different than your usual symptoms.  You have serious thoughts about hurting yourself or others.  You are taking medicine for panic attacks and have a serious side effect. MAKE SURE YOU:  Understand these instructions.  Will watch your condition.  Will get help right away if you are not doing well or get worse.   This information is not intended to replace advice given to you by your health care provider. Make sure you discuss any questions you have with your health care provider.   Document Released: 01/25/2005 Document Revised: 01/30/2013 Document Reviewed: 09/08/2012 Elsevier Interactive Patient Education [employer].

## 2015-03-21 NOTE — ED Provider Notes (Signed)
[facility] Emergency Department Provider Note     Time seen: ----------------------------------------- 3:04 PM on 03/21/2015 -----------------------------------------    I have reviewed the triage vital signs and the nursing notes.   HISTORY  Chief Complaint Chest Pain    HPI Leroy Hernandez is a 30 y.o. male who presents ER for left upper chest pain is radiating to his left shoulder and back. He describes pain as intermittent and dull is been going on for several weeks. Patient states she is under a lot of stress due to work and due to a relationship that is pending. Patient states she's had anxiety in the past and is taking medicines which she couldn't tolerate because the side effects.   Past Medical History  Diagnosis Date  . Crohn's disease ([place])   . Rectal fistula   . Asthma   . Anxiety     There are no active problems to display for this patient.   Past Surgical History  Procedure Laterality Date  . Rectal fistula surgery    . Wrist surgery    . Colon surgery    . Colon resection      Allergies Review of patient's allergies indicates no known allergies.  Social History Social History  Substance Use Topics  . Smoking status: Never Smoker   . Smokeless tobacco: None  . Alcohol Use: No    Review of Systems Constitutional: Negative for fever. Eyes: Negative for visual changes. ENT: Negative for sore throat. Cardiovascular: Positive for chest pain Respiratory: Negative for shortness of breath. Gastrointestinal: Negative for abdominal pain, vomiting and diarrhea. Genitourinary: Negative for dysuria. Musculoskeletal: Negative for back pain. Skin: Negative for rash. Neurological: Negative for headaches, focal weakness or numbness.  psychiatric: Positive for anxiety  10-point ROS otherwise negative.  ____________________________________________   PHYSICAL EXAM:  VITAL SIGNS: ED Triage Vitals  Enc Vitals Group     BP  03/21/15 1210 102/55 mmHg     Pulse Rate 03/21/15 1210 69     Resp 03/21/15 1210 18     Temp 03/21/15 1210 98.3 F (36.8 C)     Temp Source 03/21/15 1210 Oral     SpO2 03/21/15 1210 100 %     Weight 03/21/15 1210 128 lb (58.06 kg)     Height 03/21/15 1210 5' 5"  (1.651 m)     Head Cir --      Peak Flow --      Pain Score 03/21/15 1224 0     Pain Loc --      Pain Edu? --      Excl. in [place]? --     Constitutional: Alert and oriented. Well appearing and in no distress. Eyes: Conjunctivae are normal. PERRL. Normal extraocular movements. ENT   Head: Normocephalic and atraumatic.   Nose: No congestion/rhinnorhea.   Mouth/Throat: Mucous membranes are moist.   Neck: No stridor. Cardiovascular: Normal rate, regular rhythm. Normal and symmetric distal pulses are present in all extremities. No murmurs, rubs, or gallops. Respiratory: Normal respiratory effort without tachypnea nor retractions. Breath sounds are clear and equal bilaterally. No wheezes/rales/rhonchi. Gastrointestinal: Soft and nontender. No distention. No abdominal bruits.  Musculoskeletal: Nontender with normal range of motion in all extremities. No joint effusions.  No lower extremity tenderness nor edema. Neurologic:  Normal speech and language. No gross focal neurologic deficits are appreciated. Speech is normal. No gait instability. Skin:  Skin is warm, dry and intact. No rash noted. Psychiatric: Mood and affect are normal. Speech and  behavior are normal. Patient exhibits appropriate insight and judgment. ____________________________________________  EKG: Interpreted by me. Normal sinus rhythm with a rate of 71 bpm, normal PR interval, normal QRS, normal QT interval. Normal axis.  ____________________________________________  ED COURSE:  Pertinent labs & imaging results that were available during my care of the patient were reviewed by me and considered in my medical decision making (see chart for  details). Patient with symptoms likely secondary to anxiety. He is low risk for ACS. ____________________________________________    LABS (pertinent positives/negatives)  Labs Reviewed  BASIC METABOLIC PANEL - Abnormal; Notable for the following:    Glucose, Bld 101 (*)    All other components within normal limits  CBC  TROPONIN I    RADIOLOGY Chest x-ray  IMPRESSION: No active cardiopulmonary disease. ____________________________________________  FINAL ASSESSMENT AND PLAN  Chest pain, anxiety  Plan: Patient with labs and imaging as dictated above. Patient's labs and tests are normal. I will prescribe some Ativan he can take as needed. Otherwise he is stable for outpatient follow-up.   Earleen Newport, MD   Earleen Newport, MD 03/21/15 [phone]

## 2015-03-21 NOTE — ED Notes (Signed)
Pt complains of pain to left upper chest that is radiating to left shoulder and back. Pt describes pain as an intermittent dull pain that has been going on for several weeks. Pt complains of nausea with pain. Pt states that over the last three weeks his anxiety has been worse due to work.

## 2015-04-02 DIAGNOSIS — Z8659 Personal history of other mental and behavioral disorders: Secondary | ICD-10-CM | POA: Insufficient documentation

## 2015-04-02 DIAGNOSIS — F330 Major depressive disorder, recurrent, mild: Secondary | ICD-10-CM | POA: Insufficient documentation

## 2015-04-02 DIAGNOSIS — J4520 Mild intermittent asthma, uncomplicated: Secondary | ICD-10-CM | POA: Insufficient documentation

## 2015-04-03 ENCOUNTER — Encounter
Admission: RE | Disposition: A | Discharge status: Home / Self Care | Payer: Self-pay | Source: Ambulatory Visit | Attending: Gastroenterology

## 2015-04-03 ENCOUNTER — Ambulatory Visit: Payer: BC Managed Care – PPO | Admitting: Anesthesiology

## 2015-04-03 ENCOUNTER — Encounter: Payer: Self-pay | Admitting: *Deleted

## 2015-04-03 ENCOUNTER — Ambulatory Visit
Admission: RE | Admit: 2015-04-03 | Discharge: 2015-04-03 | Disposition: A | Discharge status: Home / Self Care | Payer: BC Managed Care – PPO | Source: Ambulatory Visit | Attending: Gastroenterology | Admitting: Gastroenterology

## 2015-04-03 DIAGNOSIS — K633 Ulcer of intestine: Secondary | ICD-10-CM | POA: Diagnosis not present

## 2015-04-03 DIAGNOSIS — K50819 Crohn's disease of both small and large intestine with unspecified complications: Secondary | ICD-10-CM | POA: Diagnosis present

## 2015-04-03 DIAGNOSIS — Z7952 Long term (current) use of systemic steroids: Secondary | ICD-10-CM | POA: Insufficient documentation

## 2015-04-03 DIAGNOSIS — K5669 Other intestinal obstruction: Secondary | ICD-10-CM | POA: Diagnosis not present

## 2015-04-03 DIAGNOSIS — F419 Anxiety disorder, unspecified: Secondary | ICD-10-CM | POA: Insufficient documentation

## 2015-04-03 DIAGNOSIS — Z9889 Other specified postprocedural states: Secondary | ICD-10-CM | POA: Insufficient documentation

## 2015-04-03 DIAGNOSIS — K529 Noninfective gastroenteritis and colitis, unspecified: Secondary | ICD-10-CM | POA: Diagnosis not present

## 2015-04-03 DIAGNOSIS — Z79899 Other long term (current) drug therapy: Secondary | ICD-10-CM | POA: Insufficient documentation

## 2015-04-03 DIAGNOSIS — J45909 Unspecified asthma, uncomplicated: Secondary | ICD-10-CM | POA: Diagnosis not present

## 2015-04-03 DIAGNOSIS — Z9049 Acquired absence of other specified parts of digestive tract: Secondary | ICD-10-CM | POA: Diagnosis not present

## 2015-04-03 HISTORY — PX: COLONOSCOPY WITH PROPOFOL: SHX5780

## 2015-04-03 SURGERY — COLONOSCOPY WITH PROPOFOL
Anesthesia: General

## 2015-04-03 MED ORDER — PROPOFOL 10 MG/ML IV BOLUS
INTRAVENOUS | Status: DC | PRN
  Administered 2015-04-03: 100 mg via INTRAVENOUS
  Administered 2015-04-03: 50 mg via INTRAVENOUS

## 2015-04-03 MED ORDER — SODIUM CHLORIDE 0.9 % IV SOLN
INTRAVENOUS | Status: DC
  Administered 2015-04-03: 1000 mL via INTRAVENOUS

## 2015-04-03 MED ORDER — PROPOFOL 500 MG/50ML IV EMUL
INTRAVENOUS | Status: DC | PRN
  Administered 2015-04-03: 140 ug/kg/min via INTRAVENOUS

## 2015-04-03 NOTE — Anesthesia Preprocedure Evaluation (Signed)
Anesthesia Evaluation  Patient identified by MRN, date of birth, ID band Patient awake    Reviewed: Allergy & Precautions, NPO status , Patient's Chart, lab work & pertinent test results  Airway Mallampati: II       Dental  (+) Teeth Intact   Pulmonary asthma ,    Pulmonary exam normal        Cardiovascular Exercise Tolerance: Good  Rhythm:Regular Rate:Normal     Neuro/Psych    GI/Hepatic negative GI ROS, Neg liver ROS, Crohn's disease   Endo/Other  negative endocrine ROS  Renal/GU negative Renal ROS     Musculoskeletal   Abdominal Normal abdominal exam  (+)   Peds  Hematology negative hematology ROS (+)   Anesthesia Other Findings   Reproductive/Obstetrics                             Anesthesia Physical Anesthesia Plan  ASA: II  Anesthesia Plan: General   Post-op Pain Management:    Induction: Intravenous  Airway Management Planned: Natural Airway and Nasal Cannula  Additional Equipment:   Intra-op Plan:   Post-operative Plan:   Informed Consent: I have reviewed the patients History and Physical, chart, labs and discussed the procedure including the risks, benefits and alternatives for the proposed anesthesia with the patient or authorized representative who has indicated his/her understanding and acceptance.     Plan Discussed with: CRNA  Anesthesia Plan Comments:         Anesthesia Quick Evaluation

## 2015-04-03 NOTE — Op Note (Signed)
[facility] Gastroenterology Patient Name: Leroy Hernandez Procedure Date: 04/03/2015 2:58 PM MRN: [id number] Account #: [id number] Date of Birth: [birth year] Admit Type: Outpatient Age: 30 Room: [facility] ENDO ROOM 4 Gender: Male Note Status: Finalized Procedure:            Colonoscopy Indications:          Crohn's disease of the small bowel and colon, Hx of                        anastomotic stricture. Providers:            Lupita Dawn. Candace Cruise, MD Referring MD:         [name]. Ouida Sills, MD (Referring MD) Medicines:            Monitored Anesthesia Care Complications:        No immediate complications. Procedure:            Pre-Anesthesia Assessment:                       - Prior to the procedure, a History and Physical was                        performed, and patient medications, allergies and                        sensitivities were reviewed. The patient's tolerance of                        previous anesthesia was reviewed.                       - The risks and benefits of the procedure and the                        sedation options and risks were discussed with the                        patient. All questions were answered and informed                        consent was obtained.                       - After reviewing the risks and benefits, the patient                        was deemed in satisfactory condition to undergo the                        procedure.                       After obtaining informed consent, the colonoscope was                        passed under direct vision. Throughout the procedure,                        the patient's blood pressure, pulse, and oxygen  saturations were monitored continuously. The Olympus                        CF-Q160AL colonoscope (S#. [id number]) was introduced                        through the anus and advanced to the the ileocolonic                        anastomosis. The colonoscopy was performed  without                        difficulty. The patient tolerated the procedure well.                        The quality of the bowel preparation was fair. Findings:      Multiple ulcers were found in the sigmoid colon. No bleeding was       present. Biopsies were taken with a cold forceps for histology. They       looked to be aphthous ulcers. Tight stricture again at anastomosis.       Unable to get scope t hrough. Dilated with 12 mm TTS balloon.      The terminal ileum appeared normal.      The exam was otherwise without abnormality. Impression:           - Preparation of the colon was fair.                       - Multiple ulcers in the sigmoid colon. Biopsied.                       - The examined portion of the ileum was normal.                       - The examination was otherwise normal. Recommendation:       - Discharge patient to home.                       - Await pathology results.                       - The findings and recommendations were discussed with                        the patient.                       - Continue humira injections and imuran. Procedure Code(s):    --- Professional ---                       [phone], Colonoscopy, flexible; with biopsy, single or                        multiple Diagnosis Code(s):    --- Professional ---                       K63.3, Ulcer of intestine                       K50.80, Crohn's disease of both small and large  intestine without complications CPT copyright 2016 American Medical Association. All rights reserved. The codes documented in this report are preliminary and upon coder review may  be revised to meet current compliance requirements. Hulen Luster, MD 04/03/2015 3:17:45 PM This report has been signed electronically. Number of Addenda: 0 Note Initiated On: 04/03/2015 2:58 PM Scope Withdrawal Time: 0 hours 6 minutes 44 seconds  Total Procedure Duration: 0 hours 9 minutes 49 seconds       [facility]

## 2015-04-03 NOTE — Transfer of Care (Signed)
Immediate Anesthesia Transfer of Care Note  Patient: Leroy Hernandez  Procedure(s) Performed: Procedure(s): COLONOSCOPY WITH PROPOFOL (N/A)  Patient Location: PACU  Anesthesia Type:General  Level of Consciousness: awake, alert  and oriented  Airway & Oxygen Therapy: Patient Spontanous Breathing and Patient connected to nasal cannula oxygen  Post-op Assessment: Report given to RN and Post -op Vital signs reviewed and stable  Post vital signs: Reviewed and stable  Last Vitals:  Filed Vitals:   04/03/15 1425  BP: 120/73  Pulse: 97  Temp: 74.0 C    Complications: No apparent anesthesia complications

## 2015-04-03 NOTE — H&P (Signed)
    Primary Care Physician:  [name]., MD Primary Gastroenterologist:  Dr. Candace Cruise  Pre-Procedure History & Physical: HPI:  Leroy Hernandez is a 30 y.o. male is here for an colonoscopy.   Past Medical History  Diagnosis Date  . Crohn's disease ([place])   . Rectal fistula   . Asthma   . Anxiety     Past Surgical History  Procedure Laterality Date  . Rectal fistula surgery    . Wrist surgery    . Colon surgery    . Colon resection      Prior to Admission medications   Medication Sig Start Date End Date Taking? Authorizing Provider  acyclovir (ZOVIRAX) 800 MG tablet Take 1 tablet (800 mg total) by mouth 5 (five) times daily. For 10 days 06/21/14   Marylene Land, NP  famotidine (PEPCID) 20 MG tablet Take 1 tablet (20 mg total) by mouth 2 (two) times daily. 03/21/15 03/20/16  Earleen Newport, MD  LORazepam (ATIVAN) 0.5 MG tablet Take 1 tablet (0.5 mg total) by mouth every 8 (eight) hours as needed for anxiety. 03/21/15 03/20/16  Earleen Newport, MD  metroNIDAZOLE (FLAGYL) 500 MG tablet Take 1 tablet (500 mg total) by mouth 3 (three) times daily. 09/15/14   Orbie Pyo, MD  predniSONE (DELTASONE) 20 MG tablet Take 2 tablets (40 mg total) by mouth daily. 09/15/14 09/15/15  Orbie Pyo, MD  traMADol (ULTRAM) 50 MG tablet Take 1 tablet (50 mg total) by mouth every 6 (six) hours as needed. 09/15/14 09/15/15  Orbie Pyo, MD    Allergies as of 04/01/2015  . (No Known Allergies)    History reviewed. No pertinent family history.  Social History   Social History  . Marital Status: Married    Spouse Name: N/A  . Number of Children: N/A  . Years of Education: N/A   Occupational History  . Not on file.   Social History Main Topics  . Smoking status: Never Smoker   . Smokeless tobacco: Former Systems developer  . Alcohol Use: Not on file  . Drug Use: Not on file  . Sexual Activity: Not on file   Other Topics Concern  . Not on file   Social History  Narrative    Review of Systems: See HPI, otherwise negative ROS  Physical Exam: BP 120/73 mmHg  Pulse 97  Temp(Src) 96.4 F (35.8 C) (Oral)  SpO2 98% General:   Alert,  pleasant and cooperative in NAD Head:  Normocephalic and atraumatic. Neck:  Supple; no masses or thyromegaly. Lungs:  Clear throughout to auscultation.    Heart:  Regular rate and rhythm. Abdomen:  Soft, nontender and nondistended. Normal bowel sounds, without guarding, and without rebound.   Neurologic:  Alert and  oriented x4;  grossly normal neurologically.  Impression/Plan: TARL CEPHAS is here for an colonoscopy to be performed for hx of Crohn's.  Risks, benefits, limitations, and alternatives regarding colonoscopy have been reviewed with the patient.  Questions have been answered.  All parties agreeable.   Semiyah Newgent, Lupita Dawn, MD  04/03/2015, 2:51 PM

## 2015-04-04 NOTE — Anesthesia Postprocedure Evaluation (Signed)
Anesthesia Post Note  Patient: Leroy Hernandez  Procedure(s) Performed: Procedure(s) (LRB): COLONOSCOPY WITH PROPOFOL (N/A)  Patient location during evaluation: PACU Anesthesia Type: General Level of consciousness: awake Pain management: pain level controlled Vital Signs Assessment: post-procedure vital signs reviewed and stable Respiratory status: spontaneous breathing Cardiovascular status: blood pressure returned to baseline Anesthetic complications: no    Last Vitals:  Filed Vitals:   04/03/15 1536 04/03/15 1546  BP: 113/65 103/51  Pulse: 87 75  Temp:    Resp: 16 17    Last Pain:  Filed Vitals:   04/04/15 0742  PainSc: 0-No pain                 VAN STAVEREN,Auden Tatar

## 2015-04-07 LAB — SURGICAL PATHOLOGY

## 2015-04-08 ENCOUNTER — Encounter: Payer: Self-pay | Admitting: Gastroenterology

## 2015-06-23 ENCOUNTER — Encounter: Payer: Self-pay | Admitting: Emergency Medicine

## 2015-06-23 ENCOUNTER — Emergency Department
Admission: EM | Admit: 2015-06-23 | Discharge: 2015-06-24 | Disposition: A | Discharge status: Home / Self Care | Payer: BC Managed Care – PPO | Attending: Emergency Medicine | Admitting: Emergency Medicine

## 2015-06-23 DIAGNOSIS — K5090 Crohn's disease, unspecified, without complications: Secondary | ICD-10-CM | POA: Diagnosis not present

## 2015-06-23 DIAGNOSIS — K625 Hemorrhage of anus and rectum: Secondary | ICD-10-CM | POA: Diagnosis present

## 2015-06-23 DIAGNOSIS — J45909 Unspecified asthma, uncomplicated: Secondary | ICD-10-CM | POA: Diagnosis not present

## 2015-06-23 DIAGNOSIS — Z79899 Other long term (current) drug therapy: Secondary | ICD-10-CM | POA: Diagnosis not present

## 2015-06-23 DIAGNOSIS — Z87891 Personal history of nicotine dependence: Secondary | ICD-10-CM | POA: Diagnosis not present

## 2015-06-23 LAB — URINALYSIS COMPLETE WITH MICROSCOPIC (ARMC ONLY)
BACTERIA UA: NONE SEEN
Bilirubin Urine: NEGATIVE
Glucose, UA: 50 mg/dL — AB
HGB URINE DIPSTICK: NEGATIVE
LEUKOCYTES UA: NEGATIVE
NITRITE: NEGATIVE
PH: 5 (ref 5.0–8.0)
PROTEIN: NEGATIVE mg/dL
SPECIFIC GRAVITY, URINE: 1.029 (ref 1.005–1.030)
Squamous Epithelial / LPF: NONE SEEN

## 2015-06-23 LAB — CBC
HEMATOCRIT: 44.4 % (ref 40.0–52.0)
HEMOGLOBIN: 15.7 g/dL (ref 13.0–18.0)
MCH: 30.8 pg (ref 26.0–34.0)
MCHC: 35.3 g/dL (ref 32.0–36.0)
MCV: 87.2 fL (ref 80.0–100.0)
Platelets: 200 10*3/uL (ref 150–440)
RBC: 5.1 MIL/uL (ref 4.40–5.90)
RDW: 13.4 % (ref 11.5–14.5)
WBC: 7.8 10*3/uL (ref 3.8–10.6)

## 2015-06-23 LAB — COMPREHENSIVE METABOLIC PANEL
ALBUMIN: 4.4 g/dL (ref 3.5–5.0)
ALT: 13 U/L — ABNORMAL LOW (ref 17–63)
ANION GAP: 6 (ref 5–15)
AST: 19 U/L (ref 15–41)
Alkaline Phosphatase: 87 U/L (ref 38–126)
BILIRUBIN TOTAL: 1.3 mg/dL — AB (ref 0.3–1.2)
BUN: 17 mg/dL (ref 6–20)
CO2: 27 mmol/L (ref 22–32)
Calcium: 9.2 mg/dL (ref 8.9–10.3)
Chloride: 105 mmol/L (ref 101–111)
Creatinine, Ser: 0.98 mg/dL (ref 0.61–1.24)
GFR calc Af Amer: >60 mL/min (ref >60)
GFR calc non Af Amer: >60 mL/min (ref >60)
GLUCOSE: 136 mg/dL — AB (ref 65–99)
POTASSIUM: 3.3 mmol/L — AB (ref 3.5–5.1)
Sodium: 138 mmol/L (ref 135–145)
TOTAL PROTEIN: 7.3 g/dL (ref 6.5–8.1)

## 2015-06-23 LAB — LIPASE, BLOOD: Lipase: 38 U/L (ref 11–51)

## 2015-06-23 MED ORDER — DICYCLOMINE HCL 10 MG/ML IM SOLN
20.0000 mg | Freq: Once | INTRAMUSCULAR | Status: AC
  Administered 2015-06-23: 20 mg via INTRAMUSCULAR
  Filled 2015-06-23: qty 2

## 2015-06-23 MED ORDER — PREDNISONE 20 MG PO TABS
60.0000 mg | ORAL_TABLET | Freq: Once | ORAL | Status: AC
  Administered 2015-06-23: 60 mg via ORAL
  Filled 2015-06-23: qty 3

## 2015-06-23 MED ORDER — PREDNISONE 20 MG PO TABS: 40.0000 mg | ORAL_TABLET | Freq: Every day | ORAL | Status: DC

## 2015-06-23 MED ORDER — DICYCLOMINE HCL 20 MG PO TABS: 20.0000 mg | ORAL_TABLET | Freq: Three times a day (TID) | ORAL | Status: DC | PRN

## 2015-06-23 NOTE — ED Provider Notes (Signed)
[facility] Emergency Department Provider Note    ____________________________________________  Time seen: ~2230  I have reviewed the triage vital signs and the nursing notes.   HISTORY  Chief Complaint Abdominal Pain and Rectal Bleeding   History limited by: Not Limited   HPI Leroy Hernandez is a 30 y.o. male with history of Crohn's disease status post right hemicolectomy who presents to the emergency department today because of concerns for abdominal cramping and rectal bleeding. He states that the symptoms started yesterday. He describes it as being red blood mixed in with stool. He has had 4-5 bloody bowel movements. He states this has been accompanied by some abdominal cramping. He describes it as being located in the upper abdomen. He denies any fevers. States he last had a Crohn's flare 2-3 years ago. Had a colonoscopy performed earlier this year which was concerning for ulcers.   Past Medical History  Diagnosis Date  . Crohn's disease ([place])   . Rectal fistula   . Asthma   . Anxiety     There are no active problems to display for this patient.   Past Surgical History  Procedure Laterality Date  . Rectal fistula surgery    . Wrist surgery    . Colon surgery    . Colon resection    . Colonoscopy with propofol N/A 04/03/2015    Procedure: COLONOSCOPY WITH PROPOFOL;  Surgeon: Hulen Luster, MD;  Location: [facility] ENDOSCOPY;  Service: Endoscopy;  Laterality: N/A;    Current Outpatient Rx  Name  Route  Sig  Dispense  Refill  . acyclovir (ZOVIRAX) 800 MG tablet   Oral   Take 1 tablet (800 mg total) by mouth 5 (five) times daily. For 10 days   50 tablet   0   . famotidine (PEPCID) 20 MG tablet   Oral   Take 1 tablet (20 mg total) by mouth 2 (two) times daily.   60 tablet   1   . LORazepam (ATIVAN) 0.5 MG tablet   Oral   Take 1 tablet (0.5 mg total) by mouth every 8 (eight) hours as needed for anxiety.   20 tablet   0   . metroNIDAZOLE  (FLAGYL) 500 MG tablet   Oral   Take 1 tablet (500 mg total) by mouth 3 (three) times daily.   30 tablet   0   . predniSONE (DELTASONE) 20 MG tablet   Oral   Take 2 tablets (40 mg total) by mouth daily.   10 tablet   0   . traMADol (ULTRAM) 50 MG tablet   Oral   Take 1 tablet (50 mg total) by mouth every 6 (six) hours as needed.   20 tablet   0     Allergies Review of patient's allergies indicates no known allergies.  No family history on file.  Social History Social History  Substance Use Topics  . Smoking status: Never Smoker   . Smokeless tobacco: Former Systems developer  . Alcohol Use: No    Review of Systems  Constitutional: Negative for fever. Cardiovascular: Negative for chest pain. Respiratory: Negative for shortness of breath. Gastrointestinal: Positive for abdominal pain. Positive for GI bleeding. Neurological: Negative for headaches, focal weakness or numbness.  10-point ROS otherwise negative.  ____________________________________________   PHYSICAL EXAM:  VITAL SIGNS: ED Triage Vitals  Enc Vitals Group     BP 06/23/15 2018 132/75 mmHg     Pulse Rate 06/23/15 2018 71     Resp  06/23/15 2018 18     Temp 06/23/15 2018 98.2 F (36.8 C)     Temp Source 06/23/15 2018 Oral     SpO2 06/23/15 2018 99 %     Weight 06/23/15 2018 135 lb (61.236 kg)     Height 06/23/15 2018 5' 5"  (1.651 m)     Head Cir --      Peak Flow --      Pain Score 06/23/15 2022 4   Constitutional: Alert and oriented. Well appearing and in no distress. Eyes: Conjunctivae are normal. PERRL. Normal extraocular movements. ENT   Head: Normocephalic and atraumatic.   Nose: No congestion/rhinnorhea.   Mouth/Throat: Mucous membranes are moist.   Neck: No stridor. Hematological/Lymphatic/Immunilogical: No cervical lymphadenopathy. Cardiovascular: Normal rate, regular rhythm.  No murmurs, rubs, or gallops. Respiratory: Normal respiratory effort without tachypnea nor retractions.  Breath sounds are clear and equal bilaterally. No wheezes/rales/rhonchi. Gastrointestinal: Soft and minimally tender to palpation in the upper abdomen. Genitourinary: Deferred Musculoskeletal: Normal range of motion in all extremities. No joint effusions.  No lower extremity tenderness nor edema. Neurologic:  Normal speech and language. No gross focal neurologic deficits are appreciated.  Skin:  Skin is warm, dry and intact. No rash noted. Psychiatric: Mood and affect are normal. Speech and behavior are normal. Patient exhibits appropriate insight and judgment.  ____________________________________________    LABS (pertinent positives/negatives)  Labs Reviewed  COMPREHENSIVE METABOLIC PANEL - Abnormal; Notable for the following:    Potassium 3.3 (*)    Glucose, Bld 136 (*)    ALT 13 (*)    Total Bilirubin 1.3 (*)    All other components within normal limits  URINALYSIS COMPLETEWITH MICROSCOPIC ([facility] ONLY) - Abnormal; Notable for the following:    Color, Urine YELLOW (*)    APPearance CLEAR (*)    Glucose, UA 50 (*)    Ketones, ur TRACE (*)    All other components within normal limits  LIPASE, BLOOD  CBC     ____________________________________________   EKG  None  ____________________________________________    RADIOLOGY  None  ____________________________________________   PROCEDURES  Procedure(s) performed: None  Critical Care performed: No  ____________________________________________   INITIAL IMPRESSION / ASSESSMENT AND PLAN / ED COURSE  Pertinent labs & imaging results that were available during my care of the patient were reviewed by me and considered in my medical decision making (see chart for details).  Patient presented to the emergency department today because of concerns for abdominal pain and GI bleeding. Blood work without any leukocytosis. On exam patient does appear to be any discomfort. Patient had minimal tenderness to the upper  abdomen however no rebound or guarding. I did discuss possibility of CT with patient however he would like to try symptomatic treatment initially.  ----------------------------------------- 11:52 PM on 06/23/2015 -----------------------------------------  Patient states he does feel better after medication. This point will discharge home with prescriptions for Bentyl and prednisone. Will have patient follow-up with GI doctors.  ____________________________________________   FINAL CLINICAL IMPRESSION(S) / ED DIAGNOSES  Final diagnoses:  Crohn's disease without complication, unspecified gastrointestinal tract location [facility])     Nance Pear, MD 06/23/15 [id number]

## 2015-06-23 NOTE — ED Notes (Signed)
Patient ambulatory to triage with steady gait, without difficulty or distress noted; pt reports hx chron's; st since yesterday having mid abd pain accomp by rectal bleeding

## 2015-06-23 NOTE — Discharge Instructions (Signed)
Please seek medical attention for any high fevers, chest pain, shortness of breath, change in behavior, persistent vomiting, bloody stool or any other new or concerning symptoms.   Crohn Disease Crohn disease is a long-lasting (chronic) disease that affects your gastrointestinal (GI) tract. It often causes irritation and swelling (inflammation) in your small intestine and the beginning of your large intestine. However, it can affect any part of your GI tract. Crohn disease is part of a group of illnesses that are known as inflammatory bowel disease (IBD). Crohn disease may start slowly and get worse over time. Symptoms may come and go. They may also disappear for months or even years at a time (remission). CAUSES The exact cause of Crohn disease is not known. It may be a response that causes your body's defense system (immune system) to mistakenly attack healthy cells and tissues (autoimmune response). Your genes and your environment may also play a role. RISK FACTORS You may be at greater risk for Crohn disease if you:  Have other family members with Crohn disease or another IBD.  Use any tobacco products, including cigarettes, chewing tobacco, or electronic cigarettes.  Are in your 48s.  Have [place] European ancestry. SIGNS AND SYMPTOMS The main signs and symptoms of Crohn disease involve your GI tract. These include:  Diarrhea.  Rectal bleeding.  An urgent need to move your bowels.  The feeling that you are not finished having a bowel movement.  Abdominal pain or cramping.  Constipation. General signs and symptoms of Crohn disease may also include:  Unexplained weight loss.  Fatigue.  Fever.  Nausea.  Loss of appetite.  Joint pain  Changes in vision.  Red bumps on your skin. DIAGNOSIS Your health care provider may suspect Crohn disease based on your symptoms and your medical history. Your health care provider will do a physical exam. You may need to see a health  care provider who specializes in diseases of the digestive tract (gastroenterologist). You may also have tests to help your health care providers make a diagnosis. These may include:  Blood tests.  Stool sample tests.  Imaging tests, such as X-rays and CT scans.  Tests to examine the inside of your intestines using a long, flexible tube that has a light and a camera on the end (endoscopy or colonoscopy).  A procedure to take tissue samples from inside your bowel (biopsy) to be examined under a microscope. TREATMENT  There is no cure for Crohn disease. Treatment will focus on managing your symptoms. Crohn disease affects each person differently. Your treatment may include:  Resting your bowels. Drinking only clear liquids or getting nutrition through an IV for a period of time gives your bowels a chance to heal because they are not passing stools.  Medicines. These may be used alone or in combination (combination therapy). These may include antibiotic medicines. You may be given medicines that help to:  Reduce inflammation.  Control your immune system activity.  Fight infections.  Relieve cramps and prevent diarrhea.  Control your pain.  Surgery. You may need surgery if:  Medicines and other treatments are no longer working.  You develop complications from severe Crohn disease.  A section of your intestine becomes so damaged that it needs to be removed. HOME CARE INSTRUCTIONS  Take medicines only as directed by your health care provider.  If you were prescribed an antibiotic medicine, finish it all even if you start to feel better.  Keep all follow-up visits as directed by your health  care provider. This is important.  Talk with your health care provider about changing your diet. This may help your symptoms. Your health care provide may recommend changes, such as:  Drinking more fluids.  Avoiding milk and other foods that contain lactose.  Eating a low-fat  diet.  Avoiding high-fiber foods, such as popcorn and nuts.  Avoiding carbonated beverages, such as soda.  Eating smaller meals more often rather than eating large meals.  Keeping a food diary to identify foods that make your symptoms better or worse.  Do not use any tobacco products, including cigarettes, chewing tobacco, or electronic cigarettes. If you need help quitting, ask your health care provider.  Limit alcohol intake to no more than 1 drink per day for nonpregnant women and 2 drinks per day for men. One drink equals 12 ounces of beer, 5 ounces of wine, or 1 ounces of hard liquor.  Exercise daily or as directed by your health care provider. SEEK MEDICAL CARE IF:  You have diarrhea, abdominal cramps, and other gastrointestinal problems that are present almost all of the time.  Your symptoms do not improve with treatment.  You continue to lose weight.  You develop a rash or sores on your skin.  You develop eye problems.  You have a fever.   Your symptoms get worse.  You develop new symptoms. SEEK IMMEDIATE MEDICAL CARE IF:  You have bloody diarrhea.  You develop severe abdominal pain.  You cannot pass stools.   This information is not intended to replace advice given to you by your health care provider. Make sure you discuss any questions you have with your health care provider.   Document Released: 11/04/2004 Document Revised: 02/15/2014 Document Reviewed: 09/12/2013 Elsevier Interactive Patient Education [employer].

## 2015-06-30 ENCOUNTER — Emergency Department
Admission: EM | Admit: 2015-06-30 | Discharge: 2015-07-01 | Disposition: A | Discharge status: Home / Self Care | Payer: BC Managed Care – PPO | Attending: Emergency Medicine | Admitting: Emergency Medicine

## 2015-06-30 ENCOUNTER — Emergency Department: Payer: BC Managed Care – PPO

## 2015-06-30 ENCOUNTER — Encounter: Payer: Self-pay | Admitting: Emergency Medicine

## 2015-06-30 DIAGNOSIS — R1030 Lower abdominal pain, unspecified: Secondary | ICD-10-CM

## 2015-06-30 DIAGNOSIS — R1031 Right lower quadrant pain: Secondary | ICD-10-CM | POA: Diagnosis present

## 2015-06-30 DIAGNOSIS — K5090 Crohn's disease, unspecified, without complications: Secondary | ICD-10-CM | POA: Insufficient documentation

## 2015-06-30 DIAGNOSIS — Z79899 Other long term (current) drug therapy: Secondary | ICD-10-CM | POA: Insufficient documentation

## 2015-06-30 DIAGNOSIS — Z720 Tobacco use: Secondary | ICD-10-CM | POA: Insufficient documentation

## 2015-06-30 DIAGNOSIS — J45909 Unspecified asthma, uncomplicated: Secondary | ICD-10-CM | POA: Insufficient documentation

## 2015-06-30 LAB — COMPREHENSIVE METABOLIC PANEL
ALBUMIN: 4 g/dL (ref 3.5–5.0)
ALK PHOS: 68 U/L (ref 38–126)
ALT: 15 U/L — ABNORMAL LOW (ref 17–63)
ANION GAP: 6 (ref 5–15)
AST: 16 U/L (ref 15–41)
BILIRUBIN TOTAL: 0.9 mg/dL (ref 0.3–1.2)
BUN: 11 mg/dL (ref 6–20)
CALCIUM: 8.4 mg/dL — AB (ref 8.9–10.3)
CO2: 25 mmol/L (ref 22–32)
Chloride: 107 mmol/L (ref 101–111)
Creatinine, Ser: 0.78 mg/dL (ref 0.61–1.24)
GFR calc Af Amer: >60 mL/min (ref >60)
GLUCOSE: 91 mg/dL (ref 65–99)
POTASSIUM: 3.5 mmol/L (ref 3.5–5.1)
Sodium: 138 mmol/L (ref 135–145)
TOTAL PROTEIN: 6.8 g/dL (ref 6.5–8.1)

## 2015-06-30 LAB — TYPE AND SCREEN
ABO/RH(D): O NEG
ANTIBODY SCREEN: NEGATIVE

## 2015-06-30 LAB — CBC
HEMATOCRIT: 44.2 % (ref 40.0–52.0)
HEMOGLOBIN: 15.2 g/dL (ref 13.0–18.0)
MCH: 30.9 pg (ref 26.0–34.0)
MCHC: 34.5 g/dL (ref 32.0–36.0)
MCV: 89.6 fL (ref 80.0–100.0)
Platelets: 197 10*3/uL (ref 150–440)
RBC: 4.93 MIL/uL (ref 4.40–5.90)
RDW: 12.9 % (ref 11.5–14.5)
WBC: 9.8 10*3/uL (ref 3.8–10.6)

## 2015-06-30 MED ORDER — OXYCODONE-ACETAMINOPHEN 5-325 MG PO TABS: 1.0000 | ORAL_TABLET | Freq: Four times a day (QID) | ORAL | Status: DC | PRN

## 2015-06-30 MED ORDER — PREDNISONE 10 MG PO TABS: 10.0000 mg | ORAL_TABLET | Freq: Every day | ORAL | Status: DC

## 2015-06-30 MED ORDER — ONDANSETRON HCL 4 MG/2ML IJ SOLN
4.0000 mg | Freq: Once | INTRAMUSCULAR | Status: AC
  Administered 2015-06-30: 4 mg via INTRAVENOUS
  Filled 2015-06-30: qty 2

## 2015-06-30 MED ORDER — MORPHINE SULFATE (PF) 4 MG/ML IV SOLN
INTRAVENOUS | Status: AC
  Administered 2015-06-30: 4 mg via INTRAVENOUS
  Filled 2015-06-30: qty 1

## 2015-06-30 MED ORDER — IOPAMIDOL (ISOVUE-300) INJECTION 61%
100.0000 mL | Freq: Once | INTRAVENOUS | Status: AC | PRN
  Administered 2015-06-30: 100 mL via INTRAVENOUS
  Filled 2015-06-30: qty 100

## 2015-06-30 MED ORDER — MORPHINE SULFATE (PF) 4 MG/ML IV SOLN
4.0000 mg | Freq: Once | INTRAVENOUS | Status: AC
  Administered 2015-06-30: 4 mg via INTRAVENOUS

## 2015-06-30 MED ORDER — SODIUM CHLORIDE 0.9 % IV BOLUS (SEPSIS)
1000.0000 mL | Freq: Once | INTRAVENOUS | Status: AC
  Administered 2015-06-30: 1000 mL via INTRAVENOUS

## 2015-06-30 MED ORDER — MORPHINE SULFATE (PF) 4 MG/ML IV SOLN
4.0000 mg | Freq: Once | INTRAVENOUS | Status: AC
  Administered 2015-06-30: 4 mg via INTRAVENOUS
  Filled 2015-06-30: qty 1

## 2015-06-30 MED ORDER — DIATRIZOATE MEGLUMINE & SODIUM 66-10 % PO SOLN
15.0000 mL | ORAL | Status: AC
  Administered 2015-06-30: 15 mL via ORAL

## 2015-06-30 NOTE — ED Notes (Signed)
Pt reports pain is "coming back." Pt states pain is 3/10. MD notified.

## 2015-06-30 NOTE — Discharge Instructions (Signed)

## 2015-06-30 NOTE — ED Notes (Signed)
Informed pt MD is waiting for CT results. Pt verbalized understanding. Pt comfortable laying in stretcher. Family at bedside.

## 2015-06-30 NOTE — ED Notes (Signed)
Pt presents to ED with c/o abdominal pain and rectal bleeding x 2 weeks. Reports was seen here last week for similar symptoms, states was prescribed "steroid which helped me but once I was out of them I started hurting again." Reports has an appointment with GI next week but reports is unable to wait. Pt alert and oriented, denies chest pain, nausea, vomiting. No increased work in breathing noted.

## 2015-06-30 NOTE — ED Provider Notes (Signed)
[facility] Emergency Department Provider Note  Time seen: 11:15 PM  I have reviewed the triage vital signs and the nursing notes.   HISTORY  Chief Complaint Rectal Bleeding    HPI Leroy Hernandez is a 30 y.o. male with a past medical history of Crohn's disease who presents the emergency department with lower abdominal discomfort and intermittent rectal bleeding. According to the patient for the past 2-3 weeks she has been having intermittent rectal bleeding which happens occasionally with his Crohn's disease per patient. He also states for the past 2 weeks he has been expressing lower abdominal pain. He states he cannot get in with his GI doctor until next week but states he came to the emergency department. The symptoms began worsening. Patient was seen in the ER last week and placed on prednisone. Patient states significant improvement with prednisone however after the prednisone ran out he states symptoms began to recur once again. Denies any nausea, vomiting or fever. Describes his abdominal pain is mild to moderate cramping located across the entire lower abdomen.     Past Medical History  Diagnosis Date  . Crohn's disease ([place])   . Rectal fistula   . Asthma   . Anxiety     There are no active problems to display for this patient.   Past Surgical History  Procedure Laterality Date  . Rectal fistula surgery    . Wrist surgery    . Colon surgery    . Colon resection    . Colonoscopy with propofol N/A 04/03/2015    Procedure: COLONOSCOPY WITH PROPOFOL;  Surgeon: Hulen Luster, MD;  Location: [facility] ENDOSCOPY;  Service: Endoscopy;  Laterality: N/A;    Current Outpatient Rx  Name  Route  Sig  Dispense  Refill  . azaTHIOprine (IMURAN) 50 MG tablet   Oral   Take 1 tablet by mouth daily.         Marland Kitchen dicyclomine (BENTYL) 20 MG tablet   Oral   Take 1 tablet (20 mg total) by mouth 3 (three) times daily as needed (abdominal pain).   30 tablet   0   .  escitalopram (LEXAPRO) 5 MG tablet   Oral   Take 1 tablet by mouth daily.         Marland Kitchen HUMIRA PEN 40 MG/0.8ML PNKT   Injection   Inject 1 Syringe as directed every 28 (twenty-eight) days.           Dispense as written.   Marland Kitchen oxyCODONE-acetaminophen (ROXICET) 5-325 MG tablet   Oral   Take 1 tablet by mouth every 6 (six) hours as needed.   20 tablet   0   . predniSONE (DELTASONE) 10 MG tablet   Oral   Take 1 tablet (10 mg total) by mouth daily.   22 tablet   0     Allergies Review of patient's allergies indicates no known allergies.  History reviewed. No pertinent family history.  Social History Social History  Substance Use Topics  . Smoking status: Never Smoker   . Smokeless tobacco: Former Systems developer  . Alcohol Use: No    Review of Systems Constitutional: Negative for fever. Cardiovascular: Negative for chest pain. Respiratory: Negative for shortness of breath. Gastrointestinal: Lower abdominal pain. Negative for nausea and vomiting. Positive for loose stool. Positive for intermittent bloody stool. Genitourinary: Negative for dysuria. Musculoskeletal: Negative for back pain Neurological: Negative for headache 10-point ROS otherwise negative.  ____________________________________________   PHYSICAL EXAM:  VITAL SIGNS: ED  Triage Vitals  Enc Vitals Group     BP 06/30/15 1856 117/72 mmHg     Pulse Rate 06/30/15 1856 79     Resp 06/30/15 1856 16     Temp 06/30/15 1856 98.4 F (36.9 C)     Temp Source 06/30/15 1856 Oral     SpO2 06/30/15 1856 99 %     Weight 06/30/15 1856 130 lb (58.968 kg)     Height 06/30/15 1856 5' 5"  (1.651 m)     Head Cir --      Peak Flow --      Pain Score 06/30/15 1856 5     Pain Loc --      Pain Edu? --      Excl. in [place]? --     Constitutional: Alert and oriented. Well appearing and in no distress. Eyes: Normal exam ENT   Head: Normocephalic and atraumatic.   Mouth/Throat: Mucous membranes are moist. Cardiovascular:  Normal rate, regular rhythm. No murmur Respiratory: Normal respiratory effort without tachypnea nor retractions. Breath sounds are clear  Gastrointestinal: Soft, mild to moderate lower abdominal tenderness to palpation. No rebound or guarding. No distention. Rectal exam shows a small fistula to the right of the patient's anus, mild tenderness to palpation of this area, no obvious erythema, drainage, or signs of abscess. Musculoskeletal: Nontender with normal range of motion in all extremities. Neurologic:  Normal speech and language. No gross focal neurologic deficits  Skin:  Skin is warm, dry and intact.  Psychiatric: Mood and affect are normal.  ____________________________________________   RADIOLOGY  CT pending  ____________________________________________   INITIAL IMPRESSION / ASSESSMENT AND PLAN / ED COURSE  Pertinent labs & imaging results that were available during my care of the patient were reviewed by me and considered in my medical decision making (see chart for details).  Patient presents with return of lower abdominal pain after finishing a course of prednisone. Has follow-up with GI medicine but until next week. Patient's labs are largely within normal limits. Hemoglobin is normal. White count is normal. Given the patient is moderate lower abdominal tenderness to palpation we'll obtain a CT abdomen/pelvis to rule out intra-abdominal pathology. We will likely place the patient on a taper of prednisone, and Percocet as needed for pain control.  CT pending, patient care signed out to oncoming physician.  ____________________________________________   FINAL CLINICAL IMPRESSION(S) / ED DIAGNOSES  Lower abdominal pain Crohn's disease   Harvest Dark, MD 07/01/15 1505

## 2015-06-30 NOTE — ED Notes (Signed)
Pt presents to ED c/o recurrent rectal bleeding. Pt was seen here last Tuesday for similar complaint. Per pt, PCP "can't get him in until next week".

## 2015-07-01 NOTE — ED Notes (Signed)

## 2015-07-01 NOTE — ED Provider Notes (Signed)
-----------------------------------------   12:24 AM on 07/01/2015 -----------------------------------------   Blood pressure 106/74, pulse 72, temperature 98.4 F (36.9 C), temperature source Oral, resp. rate 16, height 5' 5"  (1.651 m), weight 130 lb (58.968 kg), SpO2 97 %.  Assuming care from Dr. Kerman Passey.  In short, Leroy Hernandez is a 30 y.o. male with a chief complaint of Rectal Bleeding .  Refer to the original H&P for additional details.  The current plan of care is to follow up the results of the CT scan.   CT abdomen and pelvis: Fluid tract from the right posterior lateral rectum is again seen with minimal increase in size from exam 9 months prior. This may be a sinus tract or fistula. Edema distally appears to extend to skin surface. Equivocal mild bowel wall thickening at the ileocolonic anastomosis with surrounding edema. Suspect this is normal postoperative appearance less likely minimal active Crohn's. Otherwise no evidence to suggest active bowel inflammation. Stable enlarged mesenteric lymph node in the right lower quadrant.  As the patient does have some mild bowel wall thickening I feel that he is still having a mild Crohn's flare. He did have some medication written and ordered by Dr. Kerman Passey. The patient will be discharged home to follow-up with his GI physician.Loney Hering, MD 07/01/15 [phone]

## 2015-07-01 NOTE — ED Notes (Signed)
Dr. Dahlia Client in room to discuss results. Pt verbalized understanding of results, pt had no further questions.

## 2015-07-03 ENCOUNTER — Encounter: Payer: Self-pay | Admitting: Emergency Medicine

## 2015-07-03 ENCOUNTER — Emergency Department
Admission: EM | Admit: 2015-07-03 | Discharge: 2015-07-04 | Payer: BC Managed Care – PPO | Attending: Emergency Medicine | Admitting: Emergency Medicine

## 2015-07-03 DIAGNOSIS — Z87891 Personal history of nicotine dependence: Secondary | ICD-10-CM | POA: Insufficient documentation

## 2015-07-03 DIAGNOSIS — J45909 Unspecified asthma, uncomplicated: Secondary | ICD-10-CM | POA: Insufficient documentation

## 2015-07-03 DIAGNOSIS — K611 Rectal abscess: Secondary | ICD-10-CM | POA: Insufficient documentation

## 2015-07-03 LAB — CBC WITH DIFFERENTIAL/PLATELET
Basophils Absolute: 0 10*3/uL (ref 0–0.1)
Basophils Relative: 0 %
Eosinophils Absolute: 0 10*3/uL (ref 0–0.7)
HCT: 40.4 % (ref 40.0–52.0)
Hemoglobin: 14 g/dL (ref 13.0–18.0)
LYMPHS ABS: 0.6 10*3/uL — AB (ref 1.0–3.6)
MCH: 30.3 pg (ref 26.0–34.0)
MCHC: 34.7 g/dL (ref 32.0–36.0)
MCV: 87.4 fL (ref 80.0–100.0)
MONO ABS: 1 10*3/uL (ref 0.2–1.0)
Monocytes Relative: 8 %
NEUTROS ABS: 11.3 10*3/uL — AB (ref 1.4–6.5)
Neutrophils Relative %: 87 %
PLATELETS: 183 10*3/uL (ref 150–440)
RBC: 4.62 MIL/uL (ref 4.40–5.90)
RDW: 12.9 % (ref 11.5–14.5)
WBC: 13 10*3/uL — AB (ref 3.8–10.6)

## 2015-07-03 LAB — LIPASE, BLOOD: LIPASE: 40 U/L (ref 11–51)

## 2015-07-03 LAB — COMPREHENSIVE METABOLIC PANEL
ALT: 15 U/L — ABNORMAL LOW (ref 17–63)
AST: 19 U/L (ref 15–41)
Albumin: 3.8 g/dL (ref 3.5–5.0)
Alkaline Phosphatase: 60 U/L (ref 38–126)
Anion gap: 9 (ref 5–15)
BILIRUBIN TOTAL: 1 mg/dL (ref 0.3–1.2)
BUN: 11 mg/dL (ref 6–20)
CHLORIDE: 105 mmol/L (ref 101–111)
CO2: 24 mmol/L (ref 22–32)
CREATININE: 0.85 mg/dL (ref 0.61–1.24)
Calcium: 8.5 mg/dL — ABNORMAL LOW (ref 8.9–10.3)
Glucose, Bld: 157 mg/dL — ABNORMAL HIGH (ref 65–99)
POTASSIUM: 3.1 mmol/L — AB (ref 3.5–5.1)
Sodium: 138 mmol/L (ref 135–145)
TOTAL PROTEIN: 6.6 g/dL (ref 6.5–8.1)

## 2015-07-03 LAB — LACTIC ACID, PLASMA: Lactic Acid, Venous: 2.3 mmol/L (ref 0.5–2.0)

## 2015-07-03 MED ORDER — ONDANSETRON HCL 4 MG/2ML IJ SOLN
INTRAMUSCULAR | Status: AC
  Administered 2015-07-03: 4 mg via INTRAVENOUS
  Filled 2015-07-03: qty 2

## 2015-07-03 MED ORDER — ONDANSETRON HCL 4 MG/2ML IJ SOLN
4.0000 mg | Freq: Once | INTRAMUSCULAR | Status: AC
  Administered 2015-07-03: 4 mg via INTRAVENOUS

## 2015-07-03 MED ORDER — SODIUM CHLORIDE 0.9 % IV BOLUS (SEPSIS)
1000.0000 mL | Freq: Once | INTRAVENOUS | Status: AC
  Administered 2015-07-03: 1000 mL via INTRAVENOUS

## 2015-07-03 MED ORDER — MORPHINE SULFATE (PF) 4 MG/ML IV SOLN
4.0000 mg | Freq: Once | INTRAVENOUS | Status: AC
  Administered 2015-07-03: 4 mg via INTRAVENOUS

## 2015-07-03 MED ORDER — MORPHINE SULFATE (PF) 4 MG/ML IV SOLN
INTRAVENOUS | Status: AC
  Administered 2015-07-03: 4 mg via INTRAVENOUS
  Filled 2015-07-03: qty 1

## 2015-07-03 NOTE — ED Notes (Signed)
Awaiting room assignment

## 2015-07-03 NOTE — ED Notes (Signed)
Charge nurse made aware of lactic acid, awaiting room assignment.

## 2015-07-03 NOTE — ED Notes (Signed)
Pt presents to ED with c/o rectal pain x 2 days. 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 Pt reports vomiting x 1 and nauseous yesterday but denies symptoms today. Pt reports painful with ambulation, and sitting down. Pt alert and oriented x 4, respirations even and unlabored. Skin warm and dry.

## 2015-07-04 DIAGNOSIS — K611 Rectal abscess: Secondary | ICD-10-CM

## 2015-07-04 HISTORY — DX: Rectal abscess: K61.1

## 2015-07-04 MED ORDER — VANCOMYCIN HCL IN DEXTROSE 1-5 GM/200ML-% IV SOLN
1000.0000 mg | Freq: Once | INTRAVENOUS | Status: AC
  Administered 2015-07-04: 1000 mg via INTRAVENOUS
  Filled 2015-07-04: qty 200

## 2015-07-04 MED ORDER — MORPHINE SULFATE (PF) 4 MG/ML IV SOLN
4.0000 mg | Freq: Once | INTRAVENOUS | Status: AC
  Administered 2015-07-04: 4 mg via INTRAVENOUS
  Filled 2015-07-04: qty 1

## 2015-07-04 NOTE — ED Notes (Signed)
Received call from [facility] stating pt will be taken to [facility] by [facility] ETA 1 hr

## 2015-07-04 NOTE — ED Provider Notes (Signed)
[facility] Emergency Department Provider Note   ____________________________________________  Time seen: Approximately 2323 PM  I have reviewed the triage vital signs and the nursing notes.   HISTORY  Chief Complaint Rectal Pain    HPI Leroy Hernandez is a 30 y.o. male who comes into the hospital today with an abscess on his bottom. He reports that his fistula seems to have gotten bigger. He also reports that it feels worse and he is unable to walk. The patient had surgery done on this area a few years ago but reports that it never fully went away. The patient had some chills and vomiting yesterday and feels that he may have had a fever today. The patient did not check his temperature at home. He reports that he's had cold sweats and feels like he did not pass out. He has been taking his steroids and his pain medicine but has not taken any today. The patient rates his pain a 7 out of 10 in intensity. The patient is concerned so he decided to come into the hospital for evaluation.The patient is not having any abdominal pain nor is he having any diarrhea at this time.   Past Medical History  Diagnosis Date  . Crohn's disease ([place])   . Rectal fistula   . Asthma   . Anxiety     There are no active problems to display for this patient.   Past Surgical History  Procedure Laterality Date  . Rectal fistula surgery    . Wrist surgery    . Colon surgery    . Colon resection    . Colonoscopy with propofol N/A 04/03/2015    Procedure: COLONOSCOPY WITH PROPOFOL;  Surgeon: Hulen Luster, MD;  Location: [facility] ENDOSCOPY;  Service: Endoscopy;  Laterality: N/A;    Current Outpatient Rx  Name  Route  Sig  Dispense  Refill  . azaTHIOprine (IMURAN) 50 MG tablet   Oral   Take 1 tablet by mouth daily.         Marland Kitchen escitalopram (LEXAPRO) 5 MG tablet   Oral   Take 1 tablet by mouth daily.         Marland Kitchen HUMIRA PEN 40 MG/0.8ML PNKT   Injection   Inject 1 Syringe as  directed every 7 (seven) days.            Dispense as written.   Marland Kitchen oxyCODONE-acetaminophen (ROXICET) 5-325 MG tablet   Oral   Take 1 tablet by mouth every 6 (six) hours as needed.   20 tablet   0   . predniSONE (DELTASONE) 10 MG tablet   Oral   Take 1 tablet (10 mg total) by mouth daily. Day 1-3: take 5 tablets PO daily Day 4-6: Take 3 tablets PO daily Day 7-10: take 1 tablet PO daily Day 11: STOP   27 tablet   0   . dicyclomine (BENTYL) 20 MG tablet   Oral   Take 1 tablet (20 mg total) by mouth 3 (three) times daily as needed (abdominal pain).   30 tablet   0     Allergies Review of patient's allergies indicates no known allergies.  No family history on file.  Social History Social History  Substance Use Topics  . Smoking status: Never Smoker   . Smokeless tobacco: Former Systems developer  . Alcohol Use: No    Review of Systems Constitutional:  fever/chills Eyes: No visual changes. ENT: No sore throat. Cardiovascular: Denies chest pain. Respiratory: Denies  shortness of breath. Gastrointestinal: Vomiting with No abdominal pain  No diarrhea.  No constipation. Genitourinary: Negative for dysuria. Musculoskeletal: Negative for back pain. Skin: Rectal abscess Neurological: Negative for headaches, focal weakness or numbness.  10-point ROS otherwise negative.  ____________________________________________   PHYSICAL EXAM:  VITAL SIGNS: ED Triage Vitals  Enc Vitals Group     BP 07/03/15 2103 131/77 mmHg     Pulse Rate 07/03/15 2103 103     Resp 07/03/15 2103 18     Temp 07/03/15 2103 98.4 F (36.9 C)     Temp Source 07/03/15 2103 Oral     SpO2 07/03/15 2103 100 %     Weight 07/03/15 2103 135 lb (61.236 kg)     Height 07/03/15 2103 5' 5"  (1.651 m)     Head Cir --      Peak Flow --      Pain Score 07/03/15 2103 7     Pain Loc --      Pain Edu? --      Excl. in [place]? --     Constitutional: Alert and oriented. Well appearing and in Moderate distress Eyes:  Conjunctivae are normal. PERRL. EOMI. Head: Atraumatic. Nose: No congestion/rhinnorhea. Mouth/Throat: Mucous membranes are moist.  Oropharynx non-erythematous. Cardiovascular: Normal rate, regular rhythm. Grossly normal heart sounds.  Good peripheral circulation. Respiratory: Normal respiratory effort.  No retractions. Lungs CTAB. Gastrointestinal: Soft and nontender. No distention. Positive bowel sounds Rectal: Tenderness to palpation along the right gluteal fold extending to the rectum with some erythema and fluctuance. Musculoskeletal: No lower extremity tenderness nor edema.   Neurologic:  Normal speech and language.  Skin:  Skin is warm, dry and intact. Perirectal abscess noted Psychiatric: Mood and affect are normal.   ____________________________________________   LABS (all labs ordered are listed, but only abnormal results are displayed)  Labs Reviewed  COMPREHENSIVE METABOLIC PANEL - Abnormal; Notable for the following:    Potassium 3.1 (*)    Glucose, Bld 157 (*)    Calcium 8.5 (*)    ALT 15 (*)    All other components within normal limits  CBC WITH DIFFERENTIAL/PLATELET - Abnormal; Notable for the following:    WBC 13.0 (*)    Neutro Abs 11.3 (*)    Lymphs Abs 0.6 (*)    All other components within normal limits  LACTIC ACID, PLASMA - Abnormal; Notable for the following:    Lactic Acid, Venous 2.3 (*)    All other components within normal limits  CULTURE, BLOOD (ROUTINE X 2)  CULTURE, BLOOD (ROUTINE X 2)  LIPASE, BLOOD   ____________________________________________  EKG  None ____________________________________________  RADIOLOGY  None ____________________________________________   PROCEDURES  Procedure(s) performed: None  Critical Care performed: No  ____________________________________________   INITIAL IMPRESSION / ASSESSMENT AND PLAN / ED COURSE  Pertinent labs & imaging results that were available during my care of the patient were  reviewed by me and considered in my medical decision making (see chart for details).  This is a 30 year old male with a history of Crohn's disease who has a fistula that extends to his right rectum. The patient has some increased pain and swelling in this area. I feel that the patient has an abscess as well as a fistula. The patient has an increase in his white blood cell count to 13. He also has a mildly elevated lactic acid. I did contact the surgeon on-call Dr.Pabon who felt that this patient's case was too complicated to be performed here. He  feels that the patient needs to be transferred for colorectal surgery. I will contact [facility] to arrange transfer. The patient receive a liter of normal saline as well as vancomycin.  I contacted [facility] but they are unable to accept the transfer as they are on diversion at this time. I then contacted the acute care surgeon at [facility] and he accepted the patient to the emergency department for further evaluation of this perirectal abscess and fistula. The patient did receive a second dose of morphine due to pain. He will be transferred to [facility]. ____________________________________________   FINAL CLINICAL IMPRESSION(S) / ED DIAGNOSES  Final diagnoses:  Perirectal abscess      NEW MEDICATIONS STARTED DURING THIS VISIT:  New Prescriptions   No medications on file     Note:  This document was prepared using Dragon voice recognition software and may include unintentional dictation errors.    Loney Hering, MD 07/04/15 [phone]

## 2015-07-08 LAB — CULTURE, BLOOD (ROUTINE X 2)
CULTURE: NO GROWTH
Culture: NO GROWTH

## 2015-07-22 ENCOUNTER — Ambulatory Visit: Payer: Self-pay | Admitting: Gastroenterology

## 2015-08-13 ENCOUNTER — Encounter: Payer: Self-pay | Admitting: Gastroenterology

## 2015-08-13 ENCOUNTER — Ambulatory Visit (INDEPENDENT_AMBULATORY_CARE_PROVIDER_SITE_OTHER): Payer: BC Managed Care – PPO | Admitting: Gastroenterology

## 2015-08-13 ENCOUNTER — Other Ambulatory Visit: Payer: Self-pay

## 2015-08-13 VITALS — BP 132/75 | HR 67 | Temp 98.1°F | Ht 65.0 in | Wt 139.0 lb

## 2015-08-13 DIAGNOSIS — K50119 Crohn's disease of large intestine with unspecified complications: Secondary | ICD-10-CM

## 2015-08-13 DIAGNOSIS — K50819 Crohn's disease of both small and large intestine with unspecified complications: Secondary | ICD-10-CM

## 2015-08-13 NOTE — Progress Notes (Signed)
Gastroenterology Consultation  Referring Provider:     [name], MD Primary Care Physician:  [name]., MD Primary Gastroenterologist:  [name]     Reason for Consultation:     Crohn's disease        HPI:   Leroy Hernandez is a 30 y.o. y/o male referred for consultation & management of Crohn's disease by Dr. [name]., MD.  This patient comes here to establish care with me after his gastroenterologist has left town. The patient appears to see me back in 2010 when he was diagnosed with Crohn's disease. The patient has been followed with Dr. Candace Cruise since then. The patient had a colonoscopy in February that showed chronic active colitis. The patient also had a stricture of his anastomosis from his colonic resection. This was dilated with a 12 mm balloon. The patient is now on Imuran and Humira. He states he is under good control without any symptoms at the present time. He denies any joint pains nausea vomiting fevers chills black stools or bloody stools. He denies any medication was added or changed when he was found to have chronic active colitis at his last colonoscopy.  Past Medical History  Diagnosis Date  . Crohn's disease ([place])   . Rectal fistula   . Asthma   . Anxiety     Past Surgical History  Procedure Laterality Date  . Rectal fistula surgery    . Wrist surgery    . Colon surgery    . Colon resection    . Colonoscopy with propofol N/A 04/03/2015    Procedure: COLONOSCOPY WITH PROPOFOL;  Surgeon: Hulen Luster, MD;  Location: [facility] ENDOSCOPY;  Service: Endoscopy;  Laterality: N/A;    Prior to Admission medications   Medication Sig Start Date End Date Taking? Authorizing Provider  azaTHIOprine (IMURAN) 50 MG tablet Take 1 tablet by mouth daily. 06/23/15  Yes Historical Provider, MD  escitalopram (LEXAPRO) 5 MG tablet Take 1 tablet by mouth daily. 04/02/15  Yes Historical Provider, MD  HUMIRA PEN 40 MG/0.8ML PNKT Inject 1 Syringe as directed every 7  (seven) days.  05/29/15  Yes Historical Provider, MD  dicyclomine (BENTYL) 20 MG tablet Take 1 tablet (20 mg total) by mouth 3 (three) times daily as needed (abdominal pain). Patient not taking: Reported on 08/13/2015 06/23/15   Nance Pear, MD  oxyCODONE-acetaminophen (ROXICET) 5-325 MG tablet Take 1 tablet by mouth every 6 (six) hours as needed. Patient not taking: Reported on 08/13/2015 06/30/15   Harvest Dark, MD  predniSONE (DELTASONE) 10 MG tablet Take 1 tablet (10 mg total) by mouth daily. Day 1-3: take 5 tablets PO daily Day 4-6: Take 3 tablets PO daily Day 7-10: take 1 tablet PO daily Day 11: STOP Patient not taking: Reported on 08/13/2015 06/30/15   Harvest Dark, MD    Family History  Problem Relation Age of Onset  . Diabetes Paternal Grandfather   . Heart disease Paternal Grandfather      Social History  Substance Use Topics  . Smoking status: Never Smoker   . Smokeless tobacco: Former Systems developer  . Alcohol Use: No    Allergies as of 08/13/2015  . (No Known Allergies)    Review of Systems:    All systems reviewed and negative except where noted in HPI.   Physical Exam:  BP 132/75 mmHg  Pulse 67  Temp(Src) 98.1 F (36.7 C) (Oral)  Ht 5' 5"  (1.651 m)  Wt 139 lb (63.05 kg)  BMI 23.13 kg/m2 No LMP for male patient. Psych:  Alert and cooperative. Normal mood and affect. General:   Alert,  Well-developed, well-nourished, pleasant and cooperative in NAD Head:  Normocephalic and atraumatic. Eyes:  Sclera clear, no icterus.   Conjunctiva pink. Ears:  Normal auditory acuity. Nose:  No deformity, discharge, or lesions. Mouth:  No deformity or lesions,oropharynx pink & moist. Neck:  Supple; no masses or thyromegaly. Lungs:  Respirations even and unlabored.  Clear throughout to auscultation.   No wheezes, crackles, or rhonchi. No acute distress. Heart:  Regular rate and rhythm; no murmurs, clicks, rubs, or gallops. Abdomen:  Normal bowel sounds.  No bruits.  Soft,  non-tender and non-distended without masses, hepatosplenomegaly or hernias noted.  No guarding or rebound tenderness.  Negative Carnett sign.   Rectal:  Deferred.  Msk:  Symmetrical without gross deformities.  Good, equal movement & strength bilaterally. Pulses:  Normal pulses noted. Extremities:  No clubbing or edema.  No cyanosis. Neurologic:  Alert and oriented x3;  grossly normal neurologically. Skin:  Intact without significant lesions or rashes.  No jaundice. Lymph Nodes:  No significant cervical adenopathy. Psych:  Alert and cooperative. Normal mood and affect.  Imaging Studies: No results found.  Assessment and Plan:   Leroy Hernandez is a 30 y.o. y/o male with a history of Crohn's disease. The patient is now here to establish care with me after his gastrologist left town. The patient had chronic active colitis on his last colonoscopy. The patient will have his blood sent off for CRP and a CBC. The patient has had fistula disease in the past and has had surgery on this. If his CRP is elevated he may need to be started on  daily mesalamine. Otherwise he has been told to follow-up with ease having any further problems. The patient has been explained the plan and agrees with it.   Note: This dictation was prepared with Dragon dictation along with smaller phrase technology. Any transcriptional errors that result from this process are unintentional.

## 2015-08-14 LAB — CBC WITH DIFFERENTIAL/PLATELET
BASOS ABS: 0.1 10*3/uL (ref 0.0–0.2)
BASOS: 2 %
EOS (ABSOLUTE): 0.3 10*3/uL (ref 0.0–0.4)
Eos: 4 %
Hematocrit: 45.6 % (ref 37.5–51.0)
Hemoglobin: 15.6 g/dL (ref 12.6–17.7)
Immature Grans (Abs): 0 10*3/uL (ref 0.0–0.1)
Immature Granulocytes: 0 %
Lymphocytes Absolute: 1 10*3/uL (ref 0.7–3.1)
Lymphs: 17 %
MCH: 29.5 pg (ref 26.6–33.0)
MCHC: 34.2 g/dL (ref 31.5–35.7)
MCV: 86 fL (ref 79–97)
MONOS ABS: 0.5 10*3/uL (ref 0.1–0.9)
Monocytes: 9 %
NEUTROS ABS: 4 10*3/uL (ref 1.4–7.0)
NEUTROS PCT: 68 %
PLATELETS: 290 10*3/uL (ref 150–379)
RBC: 5.28 x10E6/uL (ref 4.14–5.80)
RDW: 14 % (ref 12.3–15.4)
WBC: 6 10*3/uL (ref 3.4–10.8)

## 2015-08-14 LAB — C-REACTIVE PROTEIN: CRP: 1.4 mg/L (ref 0.0–4.9)

## 2015-08-15 ENCOUNTER — Telehealth: Payer: Self-pay

## 2015-08-15 NOTE — Telephone Encounter (Signed)
-----   Message from [name], MD sent at 08/14/2015  2:49 PM EDT ----- Let the patient know that his labs are normal as well as his marker for active inflammation.

## 2015-08-15 NOTE — Telephone Encounter (Signed)
Tried contacting but vm was not set up yet.

## 2015-08-19 NOTE — Telephone Encounter (Signed)
Tried contacting pt again but vm not set up and can not leave message. Left vm on Elmyra Ricks (emergency contact) for her to have pt to return my call.

## 2015-08-20 NOTE — Telephone Encounter (Signed)
Pt notified of lab results

## 2015-08-22 ENCOUNTER — Other Ambulatory Visit: Payer: Self-pay

## 2015-08-22 DIAGNOSIS — K50919 Crohn's disease, unspecified, with unspecified complications: Secondary | ICD-10-CM

## 2015-08-22 MED ORDER — AZATHIOPRINE 50 MG PO TABS: ORAL_TABLET | ORAL | Status: DC

## 2015-10-06 ENCOUNTER — Encounter: Payer: Self-pay | Admitting: Emergency Medicine

## 2015-10-06 ENCOUNTER — Emergency Department
Admission: EM | Admit: 2015-10-06 | Discharge: 2015-10-06 | Disposition: A | Discharge status: Home / Self Care | Payer: BC Managed Care – PPO | Attending: Emergency Medicine | Admitting: Emergency Medicine

## 2015-10-06 DIAGNOSIS — R1032 Left lower quadrant pain: Secondary | ICD-10-CM

## 2015-10-06 DIAGNOSIS — J45909 Unspecified asthma, uncomplicated: Secondary | ICD-10-CM | POA: Insufficient documentation

## 2015-10-06 LAB — LIPASE, BLOOD: LIPASE: 41 U/L (ref 11–51)

## 2015-10-06 LAB — CBC
HEMATOCRIT: 46.3 % (ref 40.0–52.0)
HEMOGLOBIN: 15.8 g/dL (ref 13.0–18.0)
MCH: 30.1 pg (ref 26.0–34.0)
MCHC: 34.2 g/dL (ref 32.0–36.0)
MCV: 87.9 fL (ref 80.0–100.0)
Platelets: 238 10*3/uL (ref 150–440)
RBC: 5.26 MIL/uL (ref 4.40–5.90)
RDW: 13 % (ref 11.5–14.5)
WBC: 6.8 10*3/uL (ref 3.8–10.6)

## 2015-10-06 LAB — COMPREHENSIVE METABOLIC PANEL
ALBUMIN: 4.4 g/dL (ref 3.5–5.0)
ALT: 13 U/L — ABNORMAL LOW (ref 17–63)
ANION GAP: 9 (ref 5–15)
AST: 19 U/L (ref 15–41)
Alkaline Phosphatase: 68 U/L (ref 38–126)
BILIRUBIN TOTAL: 1.3 mg/dL — AB (ref 0.3–1.2)
BUN: 14 mg/dL (ref 6–20)
CHLORIDE: 108 mmol/L (ref 101–111)
CO2: 24 mmol/L (ref 22–32)
Calcium: 9.1 mg/dL (ref 8.9–10.3)
Creatinine, Ser: 0.79 mg/dL (ref 0.61–1.24)
GFR calc Af Amer: >60 mL/min (ref >60)
Glucose, Bld: 95 mg/dL (ref 65–99)
POTASSIUM: 3.5 mmol/L (ref 3.5–5.1)
Sodium: 141 mmol/L (ref 135–145)
TOTAL PROTEIN: 7.5 g/dL (ref 6.5–8.1)

## 2015-10-06 MED ORDER — OXYCODONE-ACETAMINOPHEN 5-325 MG PO TABS: 1.0000 | ORAL_TABLET | Freq: Four times a day (QID) | ORAL | 0 refills | Status: DC | PRN

## 2015-10-06 NOTE — Discharge Instructions (Signed)
Please follow-up with GI medicine tomorrow as scheduled. Please take your pain medication as needed, as written. Please return to the emergency department for any increased abdominal pain, increased bleeding, or any fever.

## 2015-10-06 NOTE — ED Provider Notes (Signed)
[facility] Emergency Department Provider Note  Time seen: 4:37 PM  I have reviewed the triage vital signs and the nursing notes.   HISTORY  Chief Complaint Abdominal Pain and Rectal Bleeding    HPI Leroy Hernandez is a 30 y.o. male with a past medical history of anxiety, Crohn's disease, who presents the emergency department for abdominal pain and rectal bleeding. According to the patient the past 2 months he has been having intermittent abdominal pain with intermittent rectal bleeding. Patient has an appointment with his GI doctor tomorrow, but states he had to stay home from work today so he needed to come to the hospital for evaluation for a work note. States mild abdominal pain currently states it comes and goes mostly located in left lower quadrant describes as a dull type pain. Denies any nausea, vomiting. States intermittent rectal bleeding but denies any today. Patient states he is taking his medications as prescribed by his GI doctor. The patient has a appointment with the GI doctor tomorrow morning (Dr. Roselyn Reef).    Past Medical History:  Diagnosis Date  . Anxiety   . Asthma   . Crohn's disease ([place])   . Rectal fistula     Patient Active Problem List   Diagnosis Date Noted  . Crohn's disease ([place]) 08/22/2015  . Mild episode of recurrent major depressive disorder ([place]) 04/02/2015  . Asthma, mild intermittent 04/02/2015  . Crohn's disease of colon ([place]) 02/06/2015    Past Surgical History:  Procedure Laterality Date  . COLON RESECTION    . COLON SURGERY    . COLONOSCOPY WITH PROPOFOL N/A 04/03/2015   Procedure: COLONOSCOPY WITH PROPOFOL;  Surgeon: Hulen Luster, MD;  Location: [facility] ENDOSCOPY;  Service: Endoscopy;  Laterality: N/A;  . rectal fistula surgery    . WRIST SURGERY      Prior to Admission medications   Medication Sig Start Date End Date Taking? Authorizing Provider  azaTHIOprine (IMURAN) 50 MG tablet Take three tablets by mouth daily  08/22/15   [name], MD  dicyclomine (BENTYL) 20 MG tablet Take 1 tablet (20 mg total) by mouth 3 (three) times daily as needed (abdominal pain). Patient not taking: Reported on 08/13/2015 06/23/15   Nance Pear, MD  escitalopram (LEXAPRO) 5 MG tablet Take 1 tablet by mouth daily. 04/02/15   Historical Provider, MD  HUMIRA PEN 40 MG/0.8ML PNKT Inject 1 Syringe as directed every 7 (seven) days.  05/29/15   Historical Provider, MD  oxyCODONE-acetaminophen (ROXICET) 5-325 MG tablet Take 1 tablet by mouth every 6 (six) hours as needed. Patient not taking: Reported on 08/13/2015 06/30/15   Harvest Dark, MD  predniSONE (DELTASONE) 10 MG tablet Take 1 tablet (10 mg total) by mouth daily. Day 1-3: take 5 tablets PO daily Day 4-6: Take 3 tablets PO daily Day 7-10: take 1 tablet PO daily Day 11: STOP Patient not taking: Reported on 08/13/2015 06/30/15   Harvest Dark, MD    No Known Allergies  Family History  Problem Relation Age of Onset  . Diabetes Paternal Grandfather   . Heart disease Paternal Grandfather     Social History Social History  Substance Use Topics  . Smoking status: Never Smoker  . Smokeless tobacco: Former Systems developer  . Alcohol use No    Review of Systems Constitutional: Negative for fever. Cardiovascular: Negative for chest pain. Respiratory: Negative for shortness of breath. Gastrointestinal: Intermittent left lower quadrant abdominal pain. Genitourinary: Negative for dysuria. Musculoskeletal: Negative for back pain. Neurological:  Negative for headache 10-point ROS otherwise negative.  ____________________________________________   PHYSICAL EXAM:  VITAL SIGNS: ED Triage Vitals  Enc Vitals Group     BP 10/06/15 1439 121/67     Pulse Rate 10/06/15 1439 79     Resp 10/06/15 1439 18     Temp 10/06/15 1439 98 F (36.7 C)     Temp Source 10/06/15 1439 Oral     SpO2 10/06/15 1439 100 %     Weight 10/06/15 1439 130 lb (59 kg)     Height 10/06/15 1439 5' 4"   (1.626 m)     Head Circumference --      Peak Flow --      Pain Score 10/06/15 1440 4     Pain Loc --      Pain Edu? --      Excl. in [place]? --     Constitutional: Alert and oriented. Well appearing and in no distress. Eyes: Normal exam ENT   Head: Normocephalic and atraumatic.   Mouth/Throat: Mucous membranes are moist. Cardiovascular: Normal rate, regular rhythm. No murmur Respiratory: Normal respiratory effort without tachypnea nor retractions. Breath sounds are clear Gastrointestinal: Soft and nontender. No distention.  Rectal exam shows no active fistulas, no drainage, no tender perianal locations. Musculoskeletal: Nontender with normal range of motion in all extremities. Neurologic:  Normal speech and language. No gross focal neurologic deficits Skin:  Skin is warm, dry and intact.  Psychiatric: Mood and affect are normal. Speech and behavior are normal.   ____________________________________________   INITIAL IMPRESSION / ASSESSMENT AND PLAN / ED COURSE  Pertinent labs & imaging results that were available during my care of the patient were reviewed by me and considered in my medical decision making (see chart for details).  The patient presents the emergency department with intermittent left lower quadrant abdominal pain with intermittent rectal bleeding over the past 2 months. Patient has a nontender abdominal exam. Has a history of rectal fistulas, no apparent active rectal fistula today. States intermittent bloody stools over the past 2 months which she states is somewhat normal for him. States his abdominal pain was worse this morning and he stayed home from work so he had to come in for evaluation. The patient states he has a follow up appointment with his GI doctor tomorrow morning. Patient is currently taking Humira as well as Imuran. The patient has normal labs including a normal white blood cell count, normal H&H. Nontender abdominal exam with no active rectal  fistulas. I do not believe the patient requires another abdominal CT at this time. As the patient has a follow up appointment tomorrow morning I will not make changes to his immune modulators, instead I will prescribe the patient short course of pain medication, and have him follow-up tomorrow morning. The patient is agreeable to this plan. I discussed return precautions, the patient is agreeable.  ____________________________________________   FINAL CLINICAL IMPRESSION(S) / ED DIAGNOSES  Abdominal pain   Harvest Dark, MD 10/06/15 1642

## 2015-10-06 NOTE — ED Triage Notes (Signed)
Pt presents with abd pain and rectal bleeding for one mth.  Pt hx of crohns disease and has appt with GI tomorrow.

## 2015-10-06 NOTE — ED Notes (Signed)
Pt comes in to ED w/ c/o L. Medial abdominal pain and rectal bleeding. Per pt rectal bleeding has been ongoing x1 month. Pt does have a hx of [place] and thinks it may be a flare up. Abdomen soft, tender, bowel sounds active. Pt AOx4. NAD noted.

## 2015-10-06 NOTE — ED Notes (Signed)
MD at bedside. 

## 2015-10-07 ENCOUNTER — Ambulatory Visit (INDEPENDENT_AMBULATORY_CARE_PROVIDER_SITE_OTHER): Payer: BC Managed Care – PPO | Admitting: Gastroenterology

## 2015-10-07 ENCOUNTER — Other Ambulatory Visit
Admission: RE | Admit: 2015-10-07 | Discharge: 2015-10-07 | Disposition: A | Discharge status: Home / Self Care | Payer: BC Managed Care – PPO | Source: Ambulatory Visit | Attending: Gastroenterology | Admitting: Gastroenterology

## 2015-10-07 ENCOUNTER — Other Ambulatory Visit: Payer: Self-pay

## 2015-10-07 ENCOUNTER — Encounter: Payer: Self-pay | Admitting: Gastroenterology

## 2015-10-07 VITALS — BP 105/67 | HR 86 | Temp 97.7°F | Ht 65.0 in | Wt 138.6 lb

## 2015-10-07 DIAGNOSIS — K50919 Crohn's disease, unspecified, with unspecified complications: Secondary | ICD-10-CM

## 2015-10-07 LAB — C-REACTIVE PROTEIN: CRP: 0.7 mg/dL (ref <1.0)

## 2015-10-07 NOTE — Progress Notes (Signed)
Primary Care Physician: [name]., MD  Primary Gastroenterologist:  Dr. [name]  Chief Complaint  Patient presents with  . Crohn's Disease    HPI: Leroy Hernandez is a 30 y.o. male here for follow-up as his Crohn's disease. The patient has a history of Crohn's disease with a resection done in the past. The patient also had a stricture at his anastomosis. The patient's last colonoscopy was back in February by Dr. Candace Cruise. The patient states he has been having some bright red blood per rectum and he reports he has to go to the bathroom right after eating. There is no report of any unexplained weight loss. The patient also reports that he has been having diarrhea. The patient has been on Humira but states that he has been taking it every week which has made him run out of it quicker. The patient is unaware that the dosing should be every other week. He denies any abdominal cramping or pain.  Current Outpatient Prescriptions  Medication Sig Dispense Refill  . LORazepam (ATIVAN) 0.5 MG tablet Take by mouth.    . azaTHIOprine (IMURAN) 50 MG tablet Take three tablets by mouth daily 90 tablet 11  . dicyclomine (BENTYL) 20 MG tablet Take 1 tablet (20 mg total) by mouth 3 (three) times daily as needed (abdominal pain). (Patient not taking: Reported on 08/13/2015) 30 tablet 0  . escitalopram (LEXAPRO) 5 MG tablet Take 1 tablet by mouth daily.    Marland Kitchen HUMIRA PEN 40 MG/0.8ML PNKT Inject 1 Syringe as directed every 7 (seven) days.     Marland Kitchen oxyCODONE-acetaminophen (ROXICET) 5-325 MG tablet Take 1 tablet by mouth every 6 (six) hours as needed. 10 tablet 0  . predniSONE (DELTASONE) 10 MG tablet Take 1 tablet (10 mg total) by mouth daily. Day 1-3: take 5 tablets PO daily Day 4-6: Take 3 tablets PO daily Day 7-10: take 1 tablet PO daily Day 11: STOP (Patient not taking: Reported on 08/13/2015) 27 tablet 0   No current facility-administered medications for this visit.     Allergies as of 10/07/2015  .  (No Known Allergies)    ROS:  General: Negative for anorexia, weight loss, fever, chills, fatigue, weakness. ENT: Negative for hoarseness, difficulty swallowing , nasal congestion. CV: Negative for chest pain, angina, palpitations, dyspnea on exertion, peripheral edema.  Respiratory: Negative for dyspnea at rest, dyspnea on exertion, cough, sputum, wheezing.  GI: See history of present illness. GU:  Negative for dysuria, hematuria, urinary incontinence, urinary frequency, nocturnal urination.  Endo: Negative for unusual weight change.    Physical Examination:   BP 105/67   Pulse 86   Temp 97.7 F (36.5 C) (Oral)   Ht 5' 5"  (1.651 m)   Wt 138 lb 9.6 oz (62.9 kg)   BMI 23.06 kg/m   General: Well-nourished, well-developed in no acute distress.  Eyes: No icterus. Conjunctivae pink. Mouth: Oropharyngeal mucosa moist and pink , no lesions erythema or exudate. Lungs: Clear to auscultation bilaterally. Non-labored. Heart: Regular rate and rhythm, no murmurs rubs or gallops.  Abdomen: Bowel sounds are normal, nontender, nondistended, no hepatosplenomegaly or masses, no abdominal bruits or hernia , no rebound or guarding.   Extremities: No lower extremity edema. No clubbing or deformities. Neuro: Alert and oriented x 3.  Grossly intact. Skin: Warm and dry, no jaundice.   Psych: Alert and cooperative, normal mood and affect.  Labs:    Imaging Studies: No results found.  Assessment and Plan:   Leroy Hernandez  Leroy Hernandez is a 30 y.o. y/o male who has a history of Crohn's disease with a history of intestinal resection surgery. The patient also has a history of a anastomotic stricture status post dilation in the past. He is now having rectal bleeding that he reports to be bright red blood per rectum which may be hemorrhoidal or may be a flare of his Crohn's. The patient also states that he has postprandial urgency. The patient has also been told that irritable bowel syndrome is not uncommon in  patients with inflammatory bowel disease. The patient will have his blood sent off for a CRP. The patient has been told to take his Humira every 2 weeks and his prescription has been refilled. If the patient's CRP is elevated then we may elect to start him on a tapering dose of steroids versus waiting for his Humira to start working in checking his CRP again in the future. The patient has been explained the plan and agrees with it.   Note: This dictation was prepared with Dragon dictation along with smaller phrase technology. Any transcriptional errors that result from this process are unintentional.

## 2015-10-08 ENCOUNTER — Telehealth: Payer: Self-pay

## 2015-10-08 NOTE — Telephone Encounter (Signed)
Pt notified of CRP results.

## 2015-10-08 NOTE — Telephone Encounter (Signed)
-----   Message from [name], MD sent at 10/08/2015  8:18 AM EDT ----- Let the patient know the CRP was normal. He should go back on his humira and see how he does.

## 2015-10-09 ENCOUNTER — Other Ambulatory Visit: Payer: Self-pay

## 2015-10-09 DIAGNOSIS — K50919 Crohn's disease, unspecified, with unspecified complications: Secondary | ICD-10-CM

## 2015-10-09 MED ORDER — HUMIRA PEN 40 MG/0.8ML ~~LOC~~ PNKT: 1.0000 | PEN_INJECTOR | SUBCUTANEOUS | 11 refills | Status: DC

## 2015-10-24 ENCOUNTER — Telehealth: Payer: Self-pay | Admitting: Gastroenterology

## 2015-10-24 NOTE — Telephone Encounter (Signed)
Patient is wondering when he will be starting the [place] shots?

## 2015-10-24 NOTE — Telephone Encounter (Signed)
Left vm letting pt know I was under the assumption from his ov with [name] he was already taking the Humira injection as I contacted his specialty pharmacy and was notified he had refills left. I advised him of this and he was suppose to call and schedule his delivery. Left this information on his voicemail.

## 2015-11-09 ENCOUNTER — Encounter: Payer: Self-pay | Admitting: Emergency Medicine

## 2015-11-09 ENCOUNTER — Emergency Department: Payer: BC Managed Care – PPO

## 2015-11-09 ENCOUNTER — Emergency Department
Admission: EM | Admit: 2015-11-09 | Discharge: 2015-11-09 | Disposition: A | Discharge status: Home / Self Care | Payer: BC Managed Care – PPO | Attending: Student | Admitting: Student

## 2015-11-09 DIAGNOSIS — S6992XA Unspecified injury of left wrist, hand and finger(s), initial encounter: Secondary | ICD-10-CM | POA: Diagnosis present

## 2015-11-09 DIAGNOSIS — Z79899 Other long term (current) drug therapy: Secondary | ICD-10-CM | POA: Insufficient documentation

## 2015-11-09 DIAGNOSIS — Y999 Unspecified external cause status: Secondary | ICD-10-CM | POA: Insufficient documentation

## 2015-11-09 DIAGNOSIS — J45909 Unspecified asthma, uncomplicated: Secondary | ICD-10-CM | POA: Insufficient documentation

## 2015-11-09 DIAGNOSIS — Y92410 Unspecified street and highway as the place of occurrence of the external cause: Secondary | ICD-10-CM | POA: Diagnosis not present

## 2015-11-09 DIAGNOSIS — S63502A Unspecified sprain of left wrist, initial encounter: Secondary | ICD-10-CM

## 2015-11-09 DIAGNOSIS — Y9389 Activity, other specified: Secondary | ICD-10-CM | POA: Insufficient documentation

## 2015-11-09 MED ORDER — NAPROXEN 500 MG PO TABS: 500.0000 mg | ORAL_TABLET | Freq: Two times a day (BID) | ORAL | 0 refills | Status: DC
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
## 2015-12-11 ENCOUNTER — Ambulatory Visit: Payer: BC Managed Care – PPO | Admitting: Gastroenterology

## 2015-12-16 ENCOUNTER — Encounter: Payer: Self-pay | Admitting: Gastroenterology

## 2015-12-16 ENCOUNTER — Ambulatory Visit (INDEPENDENT_AMBULATORY_CARE_PROVIDER_SITE_OTHER): Payer: BC Managed Care – PPO | Admitting: Gastroenterology

## 2015-12-16 ENCOUNTER — Other Ambulatory Visit
Admit: 2015-12-16 | Discharge: 2015-12-16 | Disposition: A | Discharge status: Home / Self Care | Payer: BC Managed Care – PPO | Attending: Gastroenterology | Admitting: Gastroenterology

## 2015-12-16 ENCOUNTER — Other Ambulatory Visit: Payer: Self-pay

## 2015-12-16 VITALS — BP 107/73 | HR 76 | Temp 98.0°F | Ht 65.0 in | Wt 133.4 lb

## 2015-12-16 DIAGNOSIS — K50911 Crohn's disease, unspecified, with rectal bleeding: Secondary | ICD-10-CM | POA: Diagnosis not present

## 2015-12-16 LAB — IRON AND TIBC
IRON: 66 ug/dL (ref 45–182)
SATURATION RATIOS: 16 % — AB (ref 17.9–39.5)
TIBC: 422 ug/dL (ref 250–450)
UIBC: 356 ug/dL

## 2015-12-16 LAB — FERRITIN: Ferritin: 15 ng/mL — ABNORMAL LOW (ref 24–336)

## 2015-12-16 NOTE — Progress Notes (Signed)
Primary Care Physician: [name]., MD  Primary Gastroenterologist:  Dr. [name]  Chief Complaint  Patient presents with  . Blood In Stools  . Diarrhea    HPI: Leroy Hernandez is a 30 y.o. male here for follow-up of his Crohn's disease.  The patient denies having a Crohn's flare at the present time but he states that he is having a lot of diarrhea with up to 8 bowel movements during the day and he reports that he has at least one bowel movement during the night.  The patient reports that when he has the symptoms he has urgency and has to run to the bathroom.  There is no report of any blood in the stools. He also states that he has been under a lot of stress recently at work.  Current Outpatient Prescriptions  Medication Sig Dispense Refill  . azaTHIOprine (IMURAN) 50 MG tablet Take three tablets by mouth daily 90 tablet 11  . escitalopram (LEXAPRO) 5 MG tablet Take 1 tablet by mouth daily.    Marland Kitchen HUMIRA PEN 40 MG/0.8ML PNKT Inject 1 Syringe as directed every 14 (fourteen) days. 2 each 11  . LORazepam (ATIVAN) 0.5 MG tablet Take by mouth.    . naproxen (NAPROSYN) 500 MG tablet Take 1 tablet (500 mg total) by mouth 2 (two) times daily with a meal. (Patient not taking: Reported on 12/16/2015) 14 tablet 0  . oxyCODONE-acetaminophen (ROXICET) 5-325 MG tablet Take 1 tablet by mouth every 6 (six) hours as needed. (Patient not taking: Reported on 12/16/2015) 10 tablet 0   No current facility-administered medications for this visit.     Allergies as of 12/16/2015  . (No Known Allergies)    ROS:  General: Negative for anorexia, weight loss, fever, chills, fatigue, weakness. ENT: Negative for hoarseness, difficulty swallowing , nasal congestion. CV: Negative for chest pain, angina, palpitations, dyspnea on exertion, peripheral edema.  Respiratory: Negative for dyspnea at rest, dyspnea on exertion, cough, sputum, wheezing.  GI: See history of present illness. GU:  Negative for  dysuria, hematuria, urinary incontinence, urinary frequency, nocturnal urination.  Endo: Negative for unusual weight change.    Physical Examination:   BP 107/73   Pulse 76   Temp 98 F (36.7 C) (Oral)   Ht 5' 5"  (1.651 m)   Wt 133 lb 6.4 oz (60.5 kg)   BMI 22.20 kg/m   General: Well-nourished, well-developed in no acute distress.  Eyes: No icterus. Conjunctivae pink. Mouth: Oropharyngeal mucosa moist and pink , no lesions erythema or exudate. Lungs: Clear to auscultation bilaterally. Non-labored. Heart: Regular rate and rhythm, no murmurs rubs or gallops.  Abdomen: Bowel sounds are normal, nontender, nondistended, no hepatosplenomegaly or masses, no abdominal bruits or hernia , no rebound or guarding.   Extremities: No lower extremity edema. No clubbing or deformities. Neuro: Alert and oriented x 3.  Grossly intact. Skin: Warm and dry, no jaundice.   Psych: Alert and cooperative, normal mood and affect.  Labs:    Imaging Studies: No results found.  Assessment and Plan:   Leroy Hernandez is a 30 y.o. y/o male with a history of Crohn's disease.  The patient has had rectal bleeding in the past with a negative CRP.  The patient now reports that he has some small amount of bleeding but his main concern is his chronic diarrhea.  The patient has been under a lot of stress and he may also have irritable bowel syndrome associated with his inflammatory bowel disease.  The patient will be set up for a colonoscopy.  The patient will also have his lead sent off for iron and ferritin with a hemochromatosis gene because the patient's wife states that this has been high in the past.  The patient has been explained the plan and agrees with it.    [name], MD. Marval Regal   Note: This dictation was prepared with Dragon dictation along with smaller phrase technology. Any transcriptional errors that result from this process are unintentional.

## 2015-12-19 ENCOUNTER — Other Ambulatory Visit: Payer: Self-pay

## 2015-12-19 ENCOUNTER — Encounter: Payer: Self-pay | Admitting: *Deleted

## 2015-12-19 MED ORDER — PEG 3350-KCL-NABCB-NACL-NASULF 236 G PO SOLR: ORAL | 0 refills | Status: DC

## 2015-12-22 ENCOUNTER — Ambulatory Visit
Admission: RE | Admit: 2015-12-22 | Discharge: 2015-12-22 | Disposition: A | Discharge status: Home / Self Care | Payer: BC Managed Care – PPO | Source: Ambulatory Visit | Attending: Gastroenterology | Admitting: Gastroenterology

## 2015-12-22 ENCOUNTER — Ambulatory Visit: Payer: BC Managed Care – PPO | Admitting: Anesthesiology

## 2015-12-22 ENCOUNTER — Encounter
Admission: RE | Disposition: A | Discharge status: Home / Self Care | Payer: Self-pay | Source: Ambulatory Visit | Attending: Gastroenterology

## 2015-12-22 DIAGNOSIS — Z980 Intestinal bypass and anastomosis status: Secondary | ICD-10-CM | POA: Diagnosis not present

## 2015-12-22 DIAGNOSIS — F419 Anxiety disorder, unspecified: Secondary | ICD-10-CM | POA: Insufficient documentation

## 2015-12-22 DIAGNOSIS — K50811 Crohn's disease of both small and large intestine with rectal bleeding: Secondary | ICD-10-CM | POA: Diagnosis not present

## 2015-12-22 DIAGNOSIS — K56609 Unspecified intestinal obstruction, unspecified as to partial versus complete obstruction: Secondary | ICD-10-CM | POA: Diagnosis not present

## 2015-12-22 DIAGNOSIS — Z8249 Family history of ischemic heart disease and other diseases of the circulatory system: Secondary | ICD-10-CM | POA: Insufficient documentation

## 2015-12-22 DIAGNOSIS — Z833 Family history of diabetes mellitus: Secondary | ICD-10-CM | POA: Diagnosis not present

## 2015-12-22 DIAGNOSIS — Z79899 Other long term (current) drug therapy: Secondary | ICD-10-CM | POA: Insufficient documentation

## 2015-12-22 DIAGNOSIS — Z87891 Personal history of nicotine dependence: Secondary | ICD-10-CM | POA: Diagnosis not present

## 2015-12-22 DIAGNOSIS — K921 Melena: Secondary | ICD-10-CM | POA: Diagnosis not present

## 2015-12-22 DIAGNOSIS — K5090 Crohn's disease, unspecified, without complications: Secondary | ICD-10-CM | POA: Diagnosis not present

## 2015-12-22 DIAGNOSIS — J45909 Unspecified asthma, uncomplicated: Secondary | ICD-10-CM | POA: Diagnosis not present

## 2015-12-22 HISTORY — PX: COLONOSCOPY WITH PROPOFOL: SHX5780

## 2015-12-22 LAB — HEMOCHROMATOSIS DNA-PCR(C282Y,H63D)

## 2015-12-22 SURGERY — COLONOSCOPY WITH PROPOFOL
Anesthesia: Monitor Anesthesia Care | Site: Rectum | Wound class: Contaminated

## 2015-12-22 MED ORDER — SODIUM CHLORIDE 0.9 % IV SOLN: INTRAVENOUS | Status: DC

## 2015-12-22 MED ORDER — PROPOFOL 10 MG/ML IV BOLUS
INTRAVENOUS | Status: DC | PRN
  Administered 2015-12-22 (×2): 20 mg via INTRAVENOUS
  Administered 2015-12-22: 30 mg via INTRAVENOUS
  Administered 2015-12-22: 40 mg via INTRAVENOUS
  Administered 2015-12-22: 20 mg via INTRAVENOUS
  Administered 2015-12-22: 30 mg via INTRAVENOUS
  Administered 2015-12-22: 60 mg via INTRAVENOUS
  Administered 2015-12-22: 30 mg via INTRAVENOUS
  Administered 2015-12-22: 20 mg via INTRAVENOUS
  Administered 2015-12-22: 30 mg via INTRAVENOUS

## 2015-12-22 MED ORDER — STERILE WATER FOR IRRIGATION IR SOLN
Status: DC | PRN
  Administered 2015-12-22: 11:00:00

## 2015-12-22 MED ORDER — LACTATED RINGERS IV SOLN
INTRAVENOUS | Status: DC
  Administered 2015-12-22: 11:00:00 via INTRAVENOUS

## 2015-12-22 MED ORDER — LIDOCAINE HCL (CARDIAC) 20 MG/ML IV SOLN
INTRAVENOUS | Status: DC | PRN
  Administered 2015-12-22: 40 mg via INTRAVENOUS

## 2015-12-22 SURGICAL SUPPLY — 23 items
CANISTER SUCT 1200ML W/VALVE (MISCELLANEOUS) ×2 IMPLANT
CLIP HMST 235XBRD CATH ROT (MISCELLANEOUS) IMPLANT
CLIP RESOLUTION 360 11X235 (MISCELLANEOUS)
FCP ESCP3.2XJMB 240X2.8X (MISCELLANEOUS)
FORCEPS BIOP RAD 4 LRG CAP 4 (CUTTING FORCEPS) ×2 IMPLANT
FORCEPS BIOP RJ4 240 W/NDL (MISCELLANEOUS)
FORCEPS ESCP3.2XJMB 240X2.8X (MISCELLANEOUS) IMPLANT
GOWN CVR UNV OPN BCK APRN NK (MISCELLANEOUS) ×2 IMPLANT
GOWN ISOL THUMB LOOP REG UNIV (MISCELLANEOUS) ×2
INJECTOR VARIJECT VIN23 (MISCELLANEOUS) IMPLANT
KIT DEFENDO VALVE AND CONN (KITS) IMPLANT
KIT ENDO PROCEDURE OLY (KITS) ×2 IMPLANT
MARKER SPOT ENDO TATTOO 5ML (MISCELLANEOUS) IMPLANT
PAD GROUND ADULT SPLIT (MISCELLANEOUS) IMPLANT
PROBE APC STR FIRE (PROBE) IMPLANT
RETRIEVER NET ROTH 2.5X230 LF (MISCELLANEOUS) IMPLANT
SNARE SHORT THROW 13M SML OVAL (MISCELLANEOUS) IMPLANT
SNARE SHORT THROW 30M LRG OVAL (MISCELLANEOUS) IMPLANT
SNARE SNG USE RND 15MM (INSTRUMENTS) IMPLANT
SPOT EX ENDOSCOPIC TATTOO (MISCELLANEOUS)
TRAP ETRAP POLY (MISCELLANEOUS) IMPLANT
VARIJECT INJECTOR VIN23 (MISCELLANEOUS)
WATER STERILE IRR 250ML POUR (IV SOLUTION) ×2 IMPLANT

## 2015-12-22 NOTE — Transfer of Care (Signed)
Immediate Anesthesia Transfer of Care Note  Patient: Leroy Hernandez  Procedure(s) Performed: Procedure(s): COLONOSCOPY WITH PROPOFOL (N/A)  Patient Location: PACU  Anesthesia Type: MAC  Level of Consciousness: awake, alert  and patient cooperative  Airway and Oxygen Therapy: Patient Spontanous Breathing and Patient connected to supplemental oxygen  Post-op Assessment: Post-op Vital signs reviewed, Patient's Cardiovascular Status Stable, Respiratory Function Stable, Patent Airway and No signs of Nausea or vomiting  Post-op Vital Signs: Reviewed and stable  Complications: No apparent anesthesia complications

## 2015-12-22 NOTE — H&P (Signed)
[name], MD [address] [place], [place] [address] Phone: [phone] Fax : [phone]  Primary Care Physician:  [name]., MD Primary Gastroenterologist:  [name]  Pre-Procedure History & Physical: HPI:  Leroy Hernandez is a 30 y.o. male is here for an colonoscopy.   Past Medical History:  Diagnosis Date  . Anxiety   . Asthma   . Crohn's disease ([place])   . Rectal fistula     Past Surgical History:  Procedure Laterality Date  . COLON RESECTION    . COLON SURGERY    . COLONOSCOPY WITH PROPOFOL N/A 04/03/2015   Procedure: COLONOSCOPY WITH PROPOFOL;  Surgeon: Hulen Luster, MD;  Location: [facility] ENDOSCOPY;  Service: Endoscopy;  Laterality: N/A;  . rectal fistula surgery    . WRIST SURGERY      Prior to Admission medications   Medication Sig Start Date End Date Taking? Authorizing Provider  escitalopram (LEXAPRO) 5 MG tablet Take 1 tablet by mouth daily. 04/02/15  Yes Historical Provider, MD  HUMIRA PEN 40 MG/0.8ML PNKT Inject 1 Syringe as directed every 14 (fourteen) days. 10/09/15  Yes [name], MD  LORazepam (ATIVAN) 0.5 MG tablet Take by mouth. 03/21/15 03/20/16 Yes Historical Provider, MD  polyethylene glycol (GOLYTELY) 236 g solution Drink one 8 oz glass every 20 mins until stools are clear. 12/19/15  Yes [name], MD  azaTHIOprine (IMURAN) 50 MG tablet Take three tablets by mouth daily Patient not taking: Reported on 12/19/2015 08/22/15   [name], MD  oxyCODONE-acetaminophen (ROXICET) 5-325 MG tablet Take 1 tablet by mouth every 6 (six) hours as needed. Patient not taking: Reported on 12/19/2015 10/06/15   Harvest Dark, MD    Allergies as of 12/16/2015  . (No Known Allergies)    Family History  Problem Relation Age of Onset  . Diabetes Paternal Grandfather   . Heart disease Paternal Grandfather     Social History   Social History  . Marital status: Married    Spouse name: N/A  . Number of children: N/A  . Years of  education: N/A   Occupational History  . Not on file.   Social History Main Topics  . Smoking status: Never Smoker  . Smokeless tobacco: Former Systems developer  . Alcohol use 1.2 oz/week    2 Cans of beer per week  . Drug use: No  . Sexual activity: Not on file   Other Topics Concern  . Not on file   Social History Narrative  . No narrative on file    Review of Systems: See HPI, otherwise negative ROS  Physical Exam: BP 113/79   Pulse 88   Temp 97.8 F (36.6 C) (Tympanic)   Resp 16   Ht 5' 5"  (1.651 m)   Wt 128 lb (58.1 kg)   SpO2 100%   BMI 21.30 kg/m  General:   Alert,  pleasant and cooperative in NAD Head:  Normocephalic and atraumatic. Neck:  Supple; no masses or thyromegaly. Lungs:  Clear throughout to auscultation.    Heart:  Regular rate and rhythm. Abdomen:  Soft, nontender and nondistended. Normal bowel sounds, without guarding, and without rebound.   Neurologic:  Alert and  oriented x4;  grossly normal neurologically.  Impression/Plan: Leroy Hernandez is here for an colonoscopy to be performed for hematochezia and crohn's  Risks, benefits, limitations, and alternatives regarding  colonoscopy have been reviewed with the patient.  Questions have been answered.  All parties agreeable.   [name],  MD  12/22/2015, 10:56 AM

## 2015-12-22 NOTE — Anesthesia Procedure Notes (Signed)
Procedure Name: MAC Performed by: Debby Clyne Pre-anesthesia Checklist: Patient identified, Emergency Drugs available, Suction available, Timeout performed and Patient being monitored Patient Re-evaluated:Patient Re-evaluated prior to inductionOxygen Delivery Method: Nasal cannula Placement Confirmation: positive ETCO2       

## 2015-12-22 NOTE — Discharge Instructions (Signed)

## 2015-12-22 NOTE — Op Note (Signed)
[facility] Patient Name: Leroy Hernandez Procedure Date: 12/22/2015 11:13 AM MRN: [id number] Account #: [id number] Date of Birth: 1985/06/23 Admit Type: Outpatient Age: 30 Room: [facility] OR ROOM 01 Gender: Male Note Status: Finalized Procedure:            Colonoscopy Indications:          Hematochezia, Personal history of Crohn's disease Providers:            [name] MD, MD Referring MD:         [name]. Ouida Sills MD, MD (Referring MD) Medicines:            Propofol per Anesthesia Complications:        No immediate complications. Procedure:            Pre-Anesthesia Assessment:                       - Prior to the procedure, a History and Physical was                        performed, and patient medications and allergies were                        reviewed. The patient's tolerance of previous                        anesthesia was also reviewed. The risks and benefits of                        the procedure and the sedation options and risks were                        discussed with the patient. All questions were                        answered, and informed consent was obtained. Prior                        Anticoagulants: The patient has taken no previous                        anticoagulant or antiplatelet agents. ASA Grade                        Assessment: II - A patient with mild systemic disease.                        After reviewing the risks and benefits, the patient was                        deemed in satisfactory condition to undergo the                        procedure.                       After obtaining informed consent, the colonoscope was                        passed under direct vision. Throughout the procedure,  the patient's blood pressure, pulse, and oxygen                        saturations were monitored continuously. The was                        introduced through the anus and advanced to  the the                        ileocolonic anastomosis. The colonoscopy was performed                        without difficulty. The patient tolerated the procedure                        well. The quality of the bowel preparation was                        excellent. Findings:      There was evidence of a prior end-to-end ileo-colonic anastomosis in the       transverse colon. This was characterized by mild stenosis. The       anastomosis was traversed.      Scattered inflammation, moderate in severity and characterized by       shallow ulcerations was found in the proximal ileum. Biopsies were taken       with a cold forceps for histology.      Inflammation characterized by shallow ulcerations was found. No sites       were spared. This was moderate in severity. Biopsies were taken with a       cold forceps for histology.      The perianal exam findings include perianal fistula. Impression:           - End-to-end ileo-colonic anastomosis, characterized by                        mild stenosis.                       - Crohn's disease. Biopsied.                       - Crohn's disease. Inflammation was found. This was                        moderate in severity. Biopsied.                       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 Recommendation:       - Await pathology results.                       - Discharge patient to home.                       - Resume previous diet.                       - Continue present medications. 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 All rights reserved. The codes documented in this report are preliminary and upon coder review may  be revised to meet current compliance requirements. [name] MD, MD 12/22/2015 11:38:03 AM This report has been signed electronically. Number of Addenda: 0 Note Initiated On: 12/22/2015 11:13 AM Total Procedure Duration: 0 hours 7 minutes 24 seconds       [facility]

## 2015-12-22 NOTE — Anesthesia Preprocedure Evaluation (Signed)
Anesthesia Evaluation  Patient identified by MRN, date of birth, ID band Patient awake    Reviewed: Allergy & Precautions, H&P , NPO status , Patient's Chart, lab work & pertinent test results, reviewed documented beta blocker date and time   Airway Mallampati: II  TM Distance: >3 FB Neck ROM: full    Dental no notable dental hx.    Pulmonary asthma ,    Pulmonary exam normal breath sounds clear to auscultation       Cardiovascular Exercise Tolerance: Good negative cardio ROS   Rhythm:regular Rate:Normal     Neuro/Psych PSYCHIATRIC DISORDERS (anxiety) negative neurological ROS     GI/Hepatic Neg liver ROS, Crohn's disease   Endo/Other  negative endocrine ROS  Renal/GU negative Renal ROS  negative genitourinary   Musculoskeletal   Abdominal   Peds  Hematology negative hematology ROS (+)   Anesthesia Other Findings   Reproductive/Obstetrics negative OB ROS                             Anesthesia Physical Anesthesia Plan  ASA: II  Anesthesia Plan: MAC   Post-op Pain Management:    Induction:   Airway Management Planned:   Additional Equipment:   Intra-op Plan:   Post-operative Plan:   Informed Consent: I have reviewed the patients History and Physical, chart, labs and discussed the procedure including the risks, benefits and alternatives for the proposed anesthesia with the patient or authorized representative who has indicated his/her understanding and acceptance.   Dental Advisory Given  Plan Discussed with: CRNA  Anesthesia Plan Comments:         Anesthesia Quick Evaluation

## 2015-12-22 NOTE — Anesthesia Postprocedure Evaluation (Signed)
Anesthesia Post Note  Patient: Leroy Hernandez  Procedure(s) Performed: Procedure(s) (LRB): COLONOSCOPY WITH PROPOFOL (N/A)  Patient location during evaluation: PACU Anesthesia Type: MAC Level of consciousness: awake and alert Pain management: pain level controlled Vital Signs Assessment: post-procedure vital signs reviewed and stable Respiratory status: spontaneous breathing, nonlabored ventilation, respiratory function stable and patient connected to nasal cannula oxygen Cardiovascular status: stable and blood pressure returned to baseline Anesthetic complications: no    Alisa Graff

## 2015-12-23 ENCOUNTER — Encounter: Payer: Self-pay | Admitting: *Deleted

## 2015-12-23 DIAGNOSIS — Z87891 Personal history of nicotine dependence: Secondary | ICD-10-CM | POA: Diagnosis not present

## 2015-12-23 DIAGNOSIS — Z79899 Other long term (current) drug therapy: Secondary | ICD-10-CM | POA: Diagnosis not present

## 2015-12-23 DIAGNOSIS — K8020 Calculus of gallbladder without cholecystitis without obstruction: Secondary | ICD-10-CM | POA: Insufficient documentation

## 2015-12-23 DIAGNOSIS — E860 Dehydration: Secondary | ICD-10-CM | POA: Insufficient documentation

## 2015-12-23 DIAGNOSIS — R1011 Right upper quadrant pain: Secondary | ICD-10-CM | POA: Diagnosis present

## 2015-12-23 DIAGNOSIS — J4520 Mild intermittent asthma, uncomplicated: Secondary | ICD-10-CM | POA: Insufficient documentation

## 2015-12-23 LAB — CBC
HCT: 43 % (ref 40.0–52.0)
Hemoglobin: 14.9 g/dL (ref 13.0–18.0)
MCH: 29.8 pg (ref 26.0–34.0)
MCHC: 34.6 g/dL (ref 32.0–36.0)
MCV: 86.3 fL (ref 80.0–100.0)
PLATELETS: 244 10*3/uL (ref 150–440)
RBC: 4.99 MIL/uL (ref 4.40–5.90)
RDW: 13.1 % (ref 11.5–14.5)
WBC: 6.4 10*3/uL (ref 3.8–10.6)

## 2015-12-23 LAB — URINALYSIS COMPLETE WITH MICROSCOPIC (ARMC ONLY)
Bilirubin Urine: NEGATIVE
GLUCOSE, UA: NEGATIVE mg/dL
HGB URINE DIPSTICK: NEGATIVE
Leukocytes, UA: NEGATIVE
Nitrite: NEGATIVE
PH: 5 (ref 5.0–8.0)
Protein, ur: 30 mg/dL — AB
Specific Gravity, Urine: 1.03 (ref 1.005–1.030)

## 2015-12-23 LAB — COMPREHENSIVE METABOLIC PANEL
ALBUMIN: 3.9 g/dL (ref 3.5–5.0)
ALT: 17 U/L (ref 17–63)
AST: 23 U/L (ref 15–41)
Alkaline Phosphatase: 84 U/L (ref 38–126)
Anion gap: 7 (ref 5–15)
BUN: 7 mg/dL (ref 6–20)
CHLORIDE: 101 mmol/L (ref 101–111)
CO2: 28 mmol/L (ref 22–32)
CREATININE: 1.12 mg/dL (ref 0.61–1.24)
Calcium: 8.7 mg/dL — ABNORMAL LOW (ref 8.9–10.3)
GFR calc non Af Amer: >60 mL/min (ref >60)
GLUCOSE: 169 mg/dL — AB (ref 65–99)
Potassium: 3.2 mmol/L — ABNORMAL LOW (ref 3.5–5.1)
SODIUM: 136 mmol/L (ref 135–145)
Total Bilirubin: 1.3 mg/dL — ABNORMAL HIGH (ref 0.3–1.2)
Total Protein: 7.2 g/dL (ref 6.5–8.1)

## 2015-12-23 LAB — LIPASE, BLOOD: LIPASE: 25 U/L (ref 11–51)

## 2015-12-23 NOTE — ED Triage Notes (Addendum)
Pt had a colonoscopy yesterday, today pt is having dizziness, chills and abd pain.  Diarrhea today.  No vomiting  Pt has crohn's disease.

## 2015-12-24 ENCOUNTER — Encounter: Payer: Self-pay | Admitting: Gastroenterology

## 2015-12-24 ENCOUNTER — Emergency Department
Admission: EM | Admit: 2015-12-24 | Discharge: 2015-12-24 | Disposition: A | Discharge status: Home / Self Care | Payer: BC Managed Care – PPO | Attending: Emergency Medicine | Admitting: Emergency Medicine

## 2015-12-24 ENCOUNTER — Emergency Department: Payer: BC Managed Care – PPO

## 2015-12-24 DIAGNOSIS — E860 Dehydration: Secondary | ICD-10-CM

## 2015-12-24 DIAGNOSIS — R109 Unspecified abdominal pain: Secondary | ICD-10-CM

## 2015-12-24 DIAGNOSIS — R42 Dizziness and giddiness: Secondary | ICD-10-CM

## 2015-12-24 DIAGNOSIS — K8020 Calculus of gallbladder without cholecystitis without obstruction: Secondary | ICD-10-CM

## 2015-12-24 MED ORDER — SODIUM CHLORIDE 0.9 % IV BOLUS (SEPSIS)
1000.0000 mL | Freq: Once | INTRAVENOUS | Status: AC
  Administered 2015-12-24: 1000 mL via INTRAVENOUS

## 2015-12-24 MED ORDER — OXYCODONE-ACETAMINOPHEN 5-325 MG PO TABS
ORAL_TABLET | ORAL | Status: AC
  Administered 2015-12-24: 1 via ORAL
  Filled 2015-12-24: qty 1

## 2015-12-24 MED ORDER — TRAMADOL HCL 50 MG PO TABS: 50.0000 mg | ORAL_TABLET | Freq: Four times a day (QID) | ORAL | 0 refills | Status: DC | PRN

## 2015-12-24 MED ORDER — OXYCODONE-ACETAMINOPHEN 5-325 MG PO TABS
1.0000 | ORAL_TABLET | Freq: Once | ORAL | Status: AC
  Administered 2015-12-24: 1 via ORAL

## 2015-12-24 MED ORDER — ONDANSETRON HCL 4 MG/2ML IJ SOLN
4.0000 mg | Freq: Once | INTRAMUSCULAR | Status: AC
  Administered 2015-12-24: 4 mg via INTRAVENOUS
  Filled 2015-12-24: qty 2

## 2015-12-24 MED ORDER — MORPHINE SULFATE (PF) 4 MG/ML IV SOLN
4.0000 mg | Freq: Once | INTRAVENOUS | Status: AC
  Administered 2015-12-24: 4 mg via INTRAVENOUS
  Filled 2015-12-24: qty 1

## 2015-12-24 NOTE — ED Provider Notes (Signed)
[facility] Emergency Department Provider Note   ____________________________________________   First MD Initiated Contact with Patient 12/24/15 0010     (approximate)  I have reviewed the triage vital signs and the nursing notes.   HISTORY  Chief Complaint Dizziness and Abdominal Pain    HPI Leroy Hernandez is a 30 y.o. male who comes into the hospital today with dizziness and abdominal pain. The patient has a history of Crohn's disease. He had a colonoscopy yesterday and reports that he went back to work today. He reports that all day he felt lightheaded and weak in his legs. The patient is also had some right upper quadrant abdominal pain. He's had intense rectal pain but reports that that is not abnormal due to his Crohn's. The patient reports he has not had much to drink today and hasn't eaten much either. He reports that anytime he eats he feels as though it goes right through him. He reports that his dizziness is worse when he stands. He's had 8 episodes of bloody bowel movement and he reports it is a little more than it had been. The patient rates his pain a 5 out of 10 in intensity. He's had some chills with no fevers. The patient did feel hot at home. He reports he took some slight on and off chest pain but denies any right now. The patient has also had nausea which is also gone at this time but no vomiting. He denies problems with urination. The patient is here for evaluation today.   Past Medical History:  Diagnosis Date  . Anxiety   . Asthma   . Crohn's disease ([place])   . Rectal fistula     Patient Active Problem List   Diagnosis Date Noted  . Blood in stool   . Intestinal bypass or anastomosis status   . Crohn's disease of both small and large intestine with rectal bleeding ([place])   . Crohn's disease ([place]) 08/22/2015  . Mild episode of recurrent major depressive disorder ([place]) 04/02/2015  . Asthma, mild intermittent 04/02/2015  . Crohn's  disease of colon ([place]) 02/06/2015    Past Surgical History:  Procedure Laterality Date  . COLON RESECTION    . COLON SURGERY    . COLONOSCOPY WITH PROPOFOL N/A 04/03/2015   Procedure: COLONOSCOPY WITH PROPOFOL;  Surgeon: Hulen Luster, MD;  Location: [facility] ENDOSCOPY;  Service: Endoscopy;  Laterality: N/A;  . rectal fistula surgery    . WRIST SURGERY      Prior to Admission medications   Medication Sig Start Date End Date Taking? Authorizing Provider  azaTHIOprine (IMURAN) 50 MG tablet Take three tablets by mouth daily Patient not taking: Reported on 12/19/2015 08/22/15   [name], MD  escitalopram (LEXAPRO) 5 MG tablet Take 1 tablet by mouth daily. 04/02/15   Historical Provider, MD  HUMIRA PEN 40 MG/0.8ML PNKT Inject 1 Syringe as directed every 14 (fourteen) days. 10/09/15   [name], MD  LORazepam (ATIVAN) 0.5 MG tablet Take by mouth. 03/21/15 03/20/16  Historical Provider, MD  oxyCODONE-acetaminophen (ROXICET) 5-325 MG tablet Take 1 tablet by mouth every 6 (six) hours as needed. Patient not taking: Reported on 12/19/2015 10/06/15   Harvest Dark, MD  polyethylene glycol (GOLYTELY) 236 g solution Drink one 8 oz glass every 20 mins until stools are clear. 12/19/15   [name], MD  traMADol (ULTRAM) 50 MG tablet Take 1 tablet (50 mg total) by mouth every 6 (six) hours as needed. 12/24/15  Loney Hering, MD    Allergies Patient has no known allergies.  Family History  Problem Relation Age of Onset  . Diabetes Paternal Grandfather   . Heart disease Paternal Grandfather     Social History Social History  Substance Use Topics  . Smoking status: Never Smoker  . Smokeless tobacco: Former Systems developer  . Alcohol use 1.2 oz/week    2 Cans of beer per week    Review of Systems Constitutional: chills Eyes: No visual changes. ENT: No sore throat. Cardiovascular: Denies chest pain. Respiratory: Denies shortness of breath. Gastrointestinal:  abdominal pain,  nausea, no vomiting.  No  diarrhea.  No constipation. Genitourinary: Negative for dysuria. Musculoskeletal: Negative for back pain. Skin: Negative for rash. Neurological: dizziness  10-point ROS otherwise negative.  ____________________________________________   PHYSICAL EXAM:  VITAL SIGNS: ED Triage Vitals  Enc Vitals Group     BP 12/23/15 2232 120/70     Pulse Rate 12/23/15 2232 95     Resp 12/23/15 2232 20     Temp 12/23/15 2232 98.5 F (36.9 C)     Temp Source 12/23/15 2232 Oral     SpO2 12/23/15 2232 99 %     Weight 12/23/15 2233 135 lb (61.2 kg)     Height 12/23/15 2233 5' 5"  (1.651 m)     Head Circumference --      Peak Flow --      Pain Score 12/23/15 2233 5     Pain Loc --      Pain Edu? --      Excl. in [place]? --     Constitutional: Alert and oriented. Well appearing and in moderate distress. Eyes: Conjunctivae are normal. PERRL. EOMI. Head: Atraumatic. Nose: No congestion/rhinnorhea. Mouth/Throat: Mucous membranes are moist.  Oropharynx non-erythematous. Cardiovascular: Normal rate, regular rhythm. Grossly normal heart sounds.  Good peripheral circulation. Respiratory: Normal respiratory effort.  No retractions. Lungs CTAB. Gastrointestinal: Soft with some right upper quadrant tenderness to palpation. No distention. Positive bowel sounds Musculoskeletal: No lower extremity tenderness nor edema.   Neurologic:  Normal speech and language.  Skin:  Skin is warm, dry and intact.  Psychiatric: Mood and affect are normal.   ____________________________________________   LABS (all labs ordered are listed, but only abnormal results are displayed)  Labs Reviewed  COMPREHENSIVE METABOLIC PANEL - Abnormal; Notable for the following:       Result Value   Potassium 3.2 (*)    Glucose, Bld 169 (*)    Calcium 8.7 (*)    Total Bilirubin 1.3 (*)    All other components within normal limits  URINALYSIS COMPLETEWITH MICROSCOPIC ([facility] ONLY) - Abnormal; Notable for the following:    Color, Urine  AMBER (*)    APPearance CLEAR (*)    Ketones, ur TRACE (*)    Protein, ur 30 (*)    Bacteria, UA RARE (*)    Squamous Epithelial / LPF 0-5 (*)    All other components within normal limits  LIPASE, BLOOD  CBC   ____________________________________________  EKG  none ____________________________________________  RADIOLOGY  KUB [place] abd ruq ____________________________________________   PROCEDURES  Procedure(s) performed: None  Procedures  Critical Care performed: No  ____________________________________________   INITIAL IMPRESSION / ASSESSMENT AND PLAN / ED COURSE  Pertinent labs & imaging results that were available during my care of the patient were reviewed by me and considered in my medical decision making (see chart for details).  30 30 year old male who comes into the hospital today  with some abdominal pain. The patient had a colonoscopy yesterday and has been feeling dizzy with this pain. The concern is that the patient has some perforated viscus. I will perform an upright KUB looking for free air. The patient reports though that his pain is in the right upper quadrant so I will also send him for a right upper quadrant ultrasound. I will check some orthostatics and determine the patient's having low blood pressure causing his dizziness. I will give the patient a liter of normal saline. The patient will be reassessed.  Clinical Course as of Dec 24 303  Wed Dec 24, 2015  0115 1. No free air identified status post colonoscopy. 2. Nonobstructive bowel gas pattern. No radiographic evidence for acute intra-abdominal process.   DG Abdomen 1 View [AW]  0145 Gallbladder polyps. Probable gallbladder calculi measuring up to 4 mm. No sonographic evidence of acute cholecystitis. Liver and bile ducts appear normal.   US Abdomen Limited RUQ [AW]    Clinical Course User Index [AW] Loney Hering, MD   The patient's pain is improved at this Time. His ultrasound shows  some gallbladder polyps as well as up probable calculi. His x-ray does not show any free air. His blood work is also unremarkable and his pain is in his right upper quadrant. I do not feel that a CT scan is indicated at this time especially given the patient's history of multiple CT scans. I informed him that he should follow-up with surgery and should return to the ED with worsened pain or symptoms. He understands the plan and will follow up as discussed.   ____________________________________________   FINAL CLINICAL IMPRESSION(S) / ED DIAGNOSES  Final diagnoses:  Abdominal pain  Calculus of gallbladder without cholecystitis without obstruction  Dehydration  Dizziness      NEW MEDICATIONS STARTED DURING THIS VISIT:  New Prescriptions   TRAMADOL (ULTRAM) 50 MG TABLET    Take 1 tablet (50 mg total) by mouth every 6 (six) hours as needed.     Note:  This document was prepared using Dragon voice recognition software and may include unintentional dictation errors.    Loney Hering, MD 12/24/15 [phone]

## 2015-12-24 NOTE — ED Notes (Signed)
Pt has abd pain with diarrhea.  Pt had colonoscopy yesterday and feels weak today.  No vomiting.  Pt alert.

## 2015-12-25 ENCOUNTER — Encounter: Payer: Self-pay | Admitting: Gastroenterology

## 2015-12-25 ENCOUNTER — Ambulatory Visit (INDEPENDENT_AMBULATORY_CARE_PROVIDER_SITE_OTHER): Payer: BC Managed Care – PPO | Admitting: Surgery

## 2015-12-25 ENCOUNTER — Encounter: Payer: Self-pay | Admitting: Surgery

## 2015-12-25 VITALS — BP 131/76 | HR 76 | Temp 98.3°F | Ht 65.0 in | Wt 133.0 lb

## 2015-12-25 DIAGNOSIS — K824 Cholesterolosis of gallbladder: Secondary | ICD-10-CM

## 2015-12-25 DIAGNOSIS — K8020 Calculus of gallbladder without cholecystitis without obstruction: Secondary | ICD-10-CM

## 2015-12-25 HISTORY — DX: Calculus of gallbladder without cholecystitis without obstruction: K80.20

## 2015-12-25 HISTORY — DX: Cholesterolosis of gallbladder: K82.4

## 2015-12-25 NOTE — Patient Instructions (Signed)
Please keep a diary of your symptoms and bring back with you to your follow up appointment listed below. We have provided a Low fat diet information.    Low-Fat Diet for Pancreatitis or Gallbladder Conditions A low-fat diet can be helpful if you have pancreatitis or a gallbladder condition. With these conditions, your pancreas and gallbladder have trouble digesting fats. A healthy eating plan with less fat will help rest your pancreas and gallbladder and reduce your symptoms. What do I need to know about this diet?  Eat a low-fat diet.  Reduce your fat intake to less than 20-30% of your total daily calories. This is less than 50-60 g of fat per day.  Remember that you need some fat in your diet. Ask your dietician what your daily goal should be.  Choose nonfat and low-fat healthy foods. Look for the words "nonfat," "low fat," or "fat free."  As a guide, look on the label and choose foods with less than 3 g of fat per serving. Eat only one serving.  Avoid alcohol.  Do not smoke. If you need help quitting, talk with your health care provider.  Eat small frequent meals instead of three large heavy meals. What foods can I eat? Grains  Include healthy grains and starches such as potatoes, wheat bread, fiber-rich cereal, and brown rice. Choose whole grain options whenever possible. In adults, whole grains should account for 45-65% of your daily calories. Fruits and Vegetables  Eat plenty of fruits and vegetables. Fresh fruits and vegetables add fiber to your diet. Meats and Other Protein Sources  Eat lean meat such as chicken and pork. Trim any fat off of meat before cooking it. Eggs, fish, and beans are other sources of protein. In adults, these foods should account for 10-35% of your daily calories. Dairy  Choose low-fat milk and dairy options. Dairy includes fat and protein, as well as calcium. Fats and Oils  Limit high-fat foods such as fried foods, sweets, baked goods, sugary  drinks. Other  Creamy sauces and condiments, such as mayonnaise, can add extra fat. Think about whether or not you need to use them, or use smaller amounts or low fat options. What foods are not recommended?  High fat foods, such as:  [employer].  Ice cream.  [place] toast.  Sweet rolls.  Pizza.  Cheese bread.  Foods covered with batter, butter, creamy sauces, or cheese.  Fried foods.  Sugary drinks and desserts.  Foods that cause gas or bloating This information is not intended to replace advice given to you by your health care provider. Make sure you discuss any questions you have with your health care provider. Document Released: 01/30/2013 Document Revised: 07/03/2015 Document Reviewed: 01/08/2013 Elsevier Interactive Patient Education  2017 [employer].

## 2015-12-25 NOTE — Progress Notes (Signed)
12/25/2015  Reason for Visit:  Cholelithiasis and gallbladder polyps  History of Present Illness: Leroy Hernandez is a 30 y.o. male with a history of Crohn's disease who presented to the emergency room on 11/15 with abdominal pain and dizziness. He had a colonoscopy on 11/13 with [name]. The colonoscopy showed mild stenosis of his ileocolonic anastomosis with moderate scattered inflammation of the colon consistent with his Crohn's disease.  In the emergency department he reported feeling dizzy and weak and having abdominal pain on the right upper side. He had laboratory workup which showed a white blood cell count 6.4 a total bilirubin of 1.3 which was stable from 3 months ago, normal LFTs and mildly elevated creatinine of 1.12 compared to his baseline of 0.79.  Ultrasound of the abdomen showed a gallbladder with very small stones measuring up to 4 mm and gallbladder polyps measuring up to 2.5 mm. There was no gallbladder wall thickening or pericholecystic fluid and patient had a negative Murphy sign. He presents today for further evaluation  Today he reports that he has been having abdominal pain for a very long time and he is used to the pain from his Crohn's disease so he is unsure as to how long and how often he has episodes of pain. He is unable to discern where the pain truly is when he has these episodes. He is also not sure of whether there is any association of the pain with food. He does describe that after he eats he has to go to the bathroom for a bowel movement soon after and has multiple bowel movements a day. He denies any radiation of the pain, fevers, chills, nausea, vomiting.  Of note he has not been very compliant with his Crohn's medications and is awaiting biopsy results for possible steroid taper.  Past Medical History: Past Medical History:  Diagnosis Date  . Anxiety   . Asthma   . Crohn's disease ([place])   . Rectal fistula      Past Surgical History: Past Surgical  History:  Procedure Laterality Date  . COLON RESECTION    . COLON SURGERY    . COLONOSCOPY WITH PROPOFOL N/A 04/03/2015   Procedure: COLONOSCOPY WITH PROPOFOL;  Surgeon: Hulen Luster, MD;  Location: [facility] ENDOSCOPY;  Service: Endoscopy;  Laterality: N/A;  . COLONOSCOPY WITH PROPOFOL N/A 12/22/2015   Procedure: COLONOSCOPY WITH PROPOFOL;  Surgeon: [name], MD;  Location: [place];  Service: Endoscopy;  Laterality: N/A;  . rectal fistula surgery    . WRIST SURGERY      Home Medications: Prior to Admission medications   Medication Sig Start Date End Date Taking? Authorizing Provider  azaTHIOprine (IMURAN) 50 MG tablet Take three tablets by mouth daily 08/22/15  Yes [name], MD  escitalopram (LEXAPRO) 5 MG tablet Take 1 tablet by mouth daily. 04/02/15  Yes Historical Provider, MD  HUMIRA PEN 40 MG/0.8ML PNKT Inject 1 Syringe as directed every 14 (fourteen) days. 10/09/15  Yes [name], MD  LORazepam (ATIVAN) 0.5 MG tablet Take by mouth. 03/21/15 03/20/16 Yes Historical Provider, MD  polyethylene glycol (GOLYTELY) 236 g solution Drink one 8 oz glass every 20 mins until stools are clear. 12/19/15  Yes [name], MD  traMADol (ULTRAM) 50 MG tablet Take 1 tablet (50 mg total) by mouth every 6 (six) hours as needed. 12/24/15  Yes Loney Hering, MD    Allergies: No Known Allergies  Social History:  reports that he has never smoked. He  has quit using smokeless tobacco. He reports that he drinks about 1.2 oz of alcohol per week . He reports that he does not use drugs.   Family History: Family History  Problem Relation Age of Onset  . Diabetes Paternal Grandfather   . Heart disease Paternal Grandfather     Review of Systems: Review of Systems  Constitutional: Negative for chills and fever.  HENT: Negative for hearing loss.   Eyes: Negative for blurred vision.  Respiratory: Negative for cough and shortness of breath.   Cardiovascular: Negative for chest pain and leg  swelling.  Gastrointestinal: Positive for abdominal pain, blood in stool and diarrhea. Negative for constipation, heartburn, nausea and vomiting.  Genitourinary: Negative for dysuria and hematuria.  Musculoskeletal: Negative for myalgias.  Skin: Negative for rash.  Neurological: Negative for dizziness.  Psychiatric/Behavioral: Negative for depression.  All other systems reviewed and are negative.   Physical Exam BP 131/76   Pulse 76   Temp 98.3 F (36.8 C) (Oral)   Ht 5' 5"  (1.651 m)   Wt 60.3 kg (133 lb)   BMI 22.13 kg/m  CONSTITUTIONAL: No acute distress HEENT:  Normocephalic, atraumatic, extraocular motion intact. NECK: Trachea is midline, and there is no jugular venous distension.  RESPIRATORY:  Lungs are clear, and breath sounds are equal bilaterally. Normal respiratory effort without pathologic use of accessory muscles. CARDIOVASCULAR: Heart is regular without murmurs, gallops, or rubs. GI: The abdomen is soft, nondistended, with only mild discomfort to palpation to the right upper quadrant, specifically to the right of midline in the upper quadrant. Negative Murphy sign. There were no palpable masses. Patient has a previous midline incision that has healed well MUSCULOSKELETAL:  Normal muscle strength and tone in all four extremities.  No peripheral edema or cyanosis. SKIN: Skin turgor is normal. There are no pathologic skin lesions.  NEUROLOGIC:  Motor and sensation is grossly normal.  Cranial nerves are grossly intact. PSYCH:  Alert and oriented to person, place and time. Affect is normal.  Laboratory Analysis: WBC 6.4, HCT 43, Cr 1.12, total bilirubin 1.3, ALT 17, AST 23, alkaline phosphatase 84, lipase 25.  Imaging: Ultrasound showing probable gallbladder calculi measuring up to 4 mm and polyps measuring up to 2.5 mm. No gallbladder wall thickening or pericholecystic fluid.  Assessment and Plan: This is a 30 y.o. male who presents with ultrasound findings of gallstones  and polyps which are very small in size in the setting of abdominal pain with dizziness following colonoscopy for Crohn's disease.  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 Looking back at previous records he also had on a CT scan in 2013 antral and proximal duodenal inflammation which could also contribute to his pain symptoms. -Have discussed with the patient that given that his symptoms are not fully clear, there is no urgency for cholecystectomy at this point. In order to better distinguish his symptoms, first he needs to be more compliant with his Crohn's medication regimen in order to help improve the inflammation noted on colonoscopy. This may contribute to some pain relief. He should also drink more fluids to stay better hydrated given his diarrhea and multiple bowel movements per day so there is no further dizziness or possible nausea. He will also start keeping track of the pain  episodes or symptoms that he's having so that we can better correlate them to timing during the day and any association with food or radiation to other parts of the body. -Patient will follow-up in one month so that we can discuss his symptoms in more detail to further elucidate if this is truly gallbladder pathology. In the meantime the patient understands that if he were to have worsening right upper quadrant pain that is not improving and persistent associated with any nausea or vomiting or fevers that he should present to the emergency room for further evaluation.  Face-to-face time spent with the patient and care providers was 45 minutes, with more than 50% of the time spent counseling, educating, and coordinating care of the patient.     Melvyn Neth, [place]

## 2015-12-29 ENCOUNTER — Telehealth: Payer: Self-pay

## 2015-12-29 ENCOUNTER — Ambulatory Visit: Payer: BC Managed Care – PPO | Admitting: Gastroenterology

## 2015-12-29 MED ORDER — PREDNISONE 20 MG PO TABS: ORAL_TABLET | ORAL | 0 refills | Status: DC

## 2015-12-29 NOTE — Telephone Encounter (Signed)
Pt notified of colonoscopy results, lab results and rx that was sent to pt's pharmacy. Pt has a follow up appt scheduled with [name] on Dec 18th.

## 2015-12-29 NOTE — Telephone Encounter (Signed)
-----   Message from [name], MD sent at 12/28/2015  3:46 PM EST ----- This patient needs to be started on a steroid taper with 40 mg per day for 2 weeks then decrease by 10.  The patient should also contact the office to discuss further treatment of his Crohn's disease.

## 2016-01-26 ENCOUNTER — Encounter: Payer: Self-pay | Admitting: Gastroenterology

## 2016-01-26 ENCOUNTER — Ambulatory Visit: Payer: Self-pay | Admitting: Surgery

## 2016-01-26 ENCOUNTER — Ambulatory Visit (INDEPENDENT_AMBULATORY_CARE_PROVIDER_SITE_OTHER): Payer: BC Managed Care – PPO | Admitting: Gastroenterology

## 2016-01-26 ENCOUNTER — Other Ambulatory Visit: Payer: Self-pay

## 2016-01-26 VITALS — BP 120/70 | HR 111 | Temp 98.0°F | Ht 65.0 in | Wt 138.0 lb

## 2016-01-26 DIAGNOSIS — K50919 Crohn's disease, unspecified, with unspecified complications: Secondary | ICD-10-CM

## 2016-01-26 NOTE — Progress Notes (Signed)
   Primary Care Physician: [name]., MD  Primary Gastroenterologist:  Dr. [name]  Chief Complaint  Patient presents with  . Crohn's Disease    HPI: Leroy Hernandez is a 30 y.o. male here For follow-up of his Crohn's disease. The patient was on Humira and continued to have flares. The patient also has been on steroids recently and states he feels better when he is on steroids. The patient was having diarrhea prior to starting the steroids. He also reports that he has been feeling weak.  Current Outpatient Prescriptions  Medication Sig Dispense Refill  . azaTHIOprine (IMURAN) 50 MG tablet Take three tablets by mouth daily 90 tablet 11  . escitalopram (LEXAPRO) 5 MG tablet Take 1 tablet by mouth daily.    Marland Kitchen HUMIRA PEN 40 MG/0.8ML PNKT Inject 1 Syringe as directed every 14 (fourteen) days. 2 each 11  . LORazepam (ATIVAN) 0.5 MG tablet Take by mouth.    . polyethylene glycol (GOLYTELY) 236 g solution Drink one 8 oz glass every 20 mins until stools are clear. 4000 mL 0  . predniSONE (DELTASONE) 20 MG tablet Take 75m daily x 2 weeks, then 397mdaily x 2 weeks, then 2047maily x 2 weeks, then 92m20mily x 2 weeks. 1 tablet 0  . traMADol (ULTRAM) 50 MG tablet Take 1 tablet (50 mg total) by mouth every 6 (six) hours as needed. 12 tablet 0   No current facility-administered medications for this visit.     Allergies as of 01/26/2016  . (No Known Allergies)    ROS:  General: Negative for anorexia, weight loss, fever, chills, fatigue, weakness. ENT: Negative for hoarseness, difficulty swallowing , nasal congestion. CV: Negative for chest pain, angina, palpitations, dyspnea on exertion, peripheral edema.  Respiratory: Negative for dyspnea at rest, dyspnea on exertion, cough, sputum, wheezing.  GI: See history of present illness. GU:  Negative for dysuria, hematuria, urinary incontinence, urinary frequency, nocturnal urination.  Endo: Negative for unusual weight change.      Physical Examination:   BP 120/70   Pulse (!) 111   Temp 98 F (36.7 C) (Oral)   Ht 5' 5"  (1.651 m)   Wt 138 lb (62.6 kg)   BMI 22.96 kg/m   General: Well-nourished, well-developed in no acute distress.  Neuro: Alert and oriented x 3.  Grossly intact. Skin: Warm and dry, no jaundice.   Psych: Alert and cooperative, normal mood and affect.  Labs:    Imaging Studies: No results found.  Assessment and Plan:   [name] 30 y52. y/[age] male comes in today with a history of Crohn's disease. The patient has not responded to his Humira and is now having a flare. The patient was started on steroids and states he is feeling better. The patient will be set up to start Entyvio. The patient will also have his blood sent off for TPMT with small starting of Imuran if this comes back to be normal.    DarrLucilla Lame. FACGMarval Regalote: This dictation was prepared with Dragon dictation along with smaller phrase technology. Any transcriptional errors that result from this process are unintentional.

## 2016-02-01 LAB — THIOPURINE METHYLTRANSFERASE (TPMT), RBC: TPMT ACTIVITY: 25.4 U/mL{RBCs}

## 2016-02-04 ENCOUNTER — Telehealth: Payer: Self-pay

## 2016-02-04 NOTE — Telephone Encounter (Signed)
Left vm with normal results. Advised we will send new rx request to specialty pharmacy.

## 2016-02-04 NOTE — Telephone Encounter (Signed)
-----   Message from [name], MD sent at 02/03/2016  7:55 AM EST ----- Let the patient know that his blood test showed him to have a normal level of the enzyme so that if we need to weaken use Imuran to treat him in the future

## 2016-02-12 ENCOUNTER — Telehealth: Payer: Self-pay

## 2016-02-12 NOTE — Telephone Encounter (Signed)
Elmyra Ricks (patient's girlfriend) called stating that her boyfriend started to take Humira for his Crohn's disease. However, according to [place], he needs to take another medication.  I told Elmyra Ricks that I didn't see an additional medication on his chart needing another medication but that I would ask Dr. Dorothey Baseman nurse to check. Elmyra Ricks agreed and had no further questions.

## 2016-02-13 NOTE — Telephone Encounter (Signed)
Spoke with pt regarding his new medication. Advised pt we are waiting on approval of medication.

## 2016-02-13 NOTE — Telephone Encounter (Signed)
LVM for pt to return my call.

## 2016-03-15 ENCOUNTER — Emergency Department: Payer: BC Managed Care – PPO

## 2016-03-15 ENCOUNTER — Emergency Department
Admission: EM | Admit: 2016-03-15 | Discharge: 2016-03-15 | Disposition: A | Discharge status: Home / Self Care | Payer: BC Managed Care – PPO | Attending: Emergency Medicine | Admitting: Emergency Medicine

## 2016-03-15 DIAGNOSIS — J4520 Mild intermittent asthma, uncomplicated: Secondary | ICD-10-CM | POA: Diagnosis not present

## 2016-03-15 DIAGNOSIS — S6991XA Unspecified injury of right wrist, hand and finger(s), initial encounter: Secondary | ICD-10-CM | POA: Diagnosis present

## 2016-03-15 DIAGNOSIS — Y9389 Activity, other specified: Secondary | ICD-10-CM | POA: Insufficient documentation

## 2016-03-15 DIAGNOSIS — Y929 Unspecified place or not applicable: Secondary | ICD-10-CM | POA: Insufficient documentation

## 2016-03-15 DIAGNOSIS — W109XXA Fall (on) (from) unspecified stairs and steps, initial encounter: Secondary | ICD-10-CM | POA: Insufficient documentation

## 2016-03-15 DIAGNOSIS — S60221A Contusion of right hand, initial encounter: Secondary | ICD-10-CM | POA: Diagnosis not present

## 2016-03-15 DIAGNOSIS — Y999 Unspecified external cause status: Secondary | ICD-10-CM | POA: Diagnosis not present

## 2016-03-15 MED ORDER — NAPROXEN 500 MG PO TABS: 500.0000 mg | ORAL_TABLET | Freq: Two times a day (BID) | ORAL | 0 refills | Status: DC

## 2016-03-15 NOTE — ED Provider Notes (Signed)
[facility] Emergency Department Provider Note ____________________________________________  Time seen: Approximately 7:42 PM  I have reviewed the triage vital signs and the nursing notes.   HISTORY  Chief Complaint Hand Injury    HPI Leroy Hernandez is a 31 y.o. male presents to the emergency department for evaluation of right hand pain. He had a mechanical, non-syncopal fall last night and fell on his right outstretched hand. He states that it wasn't very painful initially, however overnight developed a large bruise and tenderness. He has not taken anything for pain.  Past Medical History:  Diagnosis Date  . Anxiety   . Asthma   . Crohn's disease ([place])   . Rectal fistula     Patient Active Problem List   Diagnosis Date Noted  . Calculus of gallbladder without cholecystitis without obstruction 12/25/2015  . Gallbladder polyp 12/25/2015  . Blood in stool   . Intestinal bypass or anastomosis status   . Crohn's disease of both small and large intestine with rectal bleeding ([place])   . Crohn's disease ([place]) 08/22/2015  . Mild episode of recurrent major depressive disorder ([place]) 04/02/2015  . Asthma, mild intermittent 04/02/2015  . Crohn's disease of colon ([place]) 02/06/2015    Past Surgical History:  Procedure Laterality Date  . COLON RESECTION    . COLON SURGERY    . COLONOSCOPY WITH PROPOFOL N/A 04/03/2015   Procedure: COLONOSCOPY WITH PROPOFOL;  Surgeon: Hulen Luster, MD;  Location[facility] ENDOSCOPY;  Service: Endoscopy;  Laterality: N/A;  . COLONOSCOPY WITH PROPOFOL N/A 12/22/2015   Procedure: COLONOSCOPY WITH PROPOFOL;  Surgeon: [name], MD;  Location: [place];  Service: Endoscopy;  Laterality: N/A;  . rectal fistula surgery    . WRIST SURGERY      Prior to Admission medications   Medication Sig Start Date End Date Taking? Authorizing Provider  azaTHIOprine (IMURAN) 50 MG tablet Take three tablets by mouth daily 08/22/15   [name], MD  escitalopram (LEXAPRO) 5 MG tablet Take 1 tablet by mouth daily. 04/02/15   Historical Provider, MD  HUMIRA PEN 40 MG/0.8ML PNKT Inject 1 Syringe as directed every 14 (fourteen) days. 10/09/15   [name], MD  LORazepam (ATIVAN) 0.5 MG tablet Take by mouth. 03/21/15 03/20/16  Historical Provider, MD  naproxen (NAPROSYN) 500 MG tablet Take 1 tablet (500 mg total) by mouth 2 (two) times daily with a meal. 03/15/16   Sherby Moncayo B Monita Swier, FNP  polyethylene glycol (GOLYTELY) 236 g solution Drink one 8 oz glass every 20 mins until stools are clear. 12/19/15   [name], MD  predniSONE (DELTASONE) 20 MG tablet Take 54m daily x 2 weeks, then 356mdaily x 2 weeks, then 2011maily x 2 weeks, then 53m64mily x 2 weeks. 12/29/15   DarrLucilla Lame  traMADol (ULTRAM) 50 MG tablet Take 1 tablet (50 mg total) by mouth every 6 (six) hours as needed. 12/24/15   AlliLoney Hering    Allergies Patient has no known allergies.  Family History  Problem Relation Age of Onset  . Diabetes Paternal Grandfather   . Heart disease Paternal Grandfather     Social History Social History  Substance Use Topics  . Smoking status: Never Smoker  . Smokeless tobacco: Former UserSystems developerAlcohol use 1.2 oz/week    2 Cans of beer per week    Review of Systems Constitutional: No recent illness. Cardiovascular: Denies chest pain or palpitations. Respiratory: Denies shortness of breath. Musculoskeletal:  Pain in The hand Skin: Positive for superficial abrasions to the right hand  Neurological: Negative for focal weakness or numbness.  ____________________________________________   PHYSICAL EXAM:  VITAL SIGNS: ED Triage Vitals  Enc Vitals Group     BP 03/15/16 1807 130/81     Pulse Rate 03/15/16 1807 85     Resp 03/15/16 1807 16     Temp 03/15/16 1807 98.1 F (36.7 C)     Temp Source 03/15/16 1807 Oral     SpO2 03/15/16 1807 100 %     Weight 03/15/16 1807 135 lb (61.2 kg)     Height 03/15/16 1807 5' 5"   (1.651 m)     Head Circumference --      Peak Flow --      Pain Score 03/15/16 1821 5     Pain Loc --      Pain Edu? --      Excl. in [place]? --     Constitutional: Alert and oriented. Well appearing and in no acute distress. Eyes: Conjunctivae are normal. EOMI. Head: Atraumatic. Neck: No stridor.  Respiratory: Normal respiratory effort.   Musculoskeletal: Palmar aspect of the right hand with mild edema and contusion. Full range of motion of the fingers of the right hand. No snuffbox tenderness noted on exam. No tenderness noted over the radial head. Neurologic:  Normal speech and language. No gross focal neurologic deficits are appreciated. Speech is normal. No gait instability. Skin:  Superficial abrasions noted scattered on the right hand. Psychiatric: Mood and affect are normal. Speech and behavior are normal.  ____________________________________________   LABS (all labs ordered are listed, but only abnormal results are displayed)  Labs Reviewed - No data to display ____________________________________________  RADIOLOGY  Right hand negative for acute bony abnormality per radiology. ____________________________________________   PROCEDURES  Procedure(s) performed: Velcro splint applied for protection of the right hand   ____________________________________________   INITIAL IMPRESSION / ASSESSMENT AND PLAN / ED COURSE     Pertinent labs & imaging results that were available during my care of the patient were reviewed by me and considered in my medical decision making (see chart for details).  31 year old male presenting to the emergency department for evaluation of right hand pain. X-ray and exam consistent with contusion. Patient is to follow-up with the orthopedist for symptoms that are not improving over the week. He was given a prescription for Naprosyn. He was advised to return to the emergency department for symptoms that change or worsen if he is unable  schedule an appointment for primary care provider or orthopedist. ____________________________________________   FINAL CLINICAL IMPRESSION(S) / ED DIAGNOSES  Final diagnoses:  Contusion of right hand, initial encounter       Victorino Dike, FNP 03/15/16 1946    Nance Pear, MD 03/15/16 2033

## 2016-03-15 NOTE — ED Notes (Signed)
Patient presents to the ED with right hand pain.  Patient was outside going down steps after drinking, "a few beers" and he fell and used his right hand to break his fall.  Hand is now bruised and slightly swollen.  Patient is in no obvious distress at this time.

## 2016-03-15 NOTE — ED Triage Notes (Signed)
Pt reports falling last night injuring right hand.

## 2016-03-15 NOTE — Discharge Instructions (Signed)
Follow up with the orthopedic doctor for symptoms that are not improving over the week. Return to the ER for symptoms that change or worsen if unable to see the PCP or specialist.

## 2016-03-22 ENCOUNTER — Telehealth: Payer: Self-pay

## 2016-03-22 NOTE — Telephone Encounter (Signed)
Patient needs instructions on how to take his medication. It's a syringe method. Please call patient and advice.

## 2016-03-29 NOTE — Telephone Encounter (Signed)
Tried contacting pt but no vm set up. 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 I advised her to make sure he checks to see if he has any missed calls as the nurse will be calling him to schedule a time for injection training.

## 2016-04-29 ENCOUNTER — Emergency Department
Admission: EM | Admit: 2016-04-29 | Discharge: 2016-04-30 | Disposition: A | Discharge status: Home / Self Care | Payer: BC Managed Care – PPO | Attending: Student in an Organized Health Care Education/Training Program | Admitting: Student in an Organized Health Care Education/Training Program

## 2016-04-29 ENCOUNTER — Encounter: Payer: Self-pay | Admitting: Emergency Medicine

## 2016-04-29 DIAGNOSIS — J45909 Unspecified asthma, uncomplicated: Secondary | ICD-10-CM | POA: Insufficient documentation

## 2016-04-29 DIAGNOSIS — R1084 Generalized abdominal pain: Secondary | ICD-10-CM

## 2016-04-29 DIAGNOSIS — Z87891 Personal history of nicotine dependence: Secondary | ICD-10-CM | POA: Diagnosis not present

## 2016-04-29 DIAGNOSIS — K921 Melena: Secondary | ICD-10-CM

## 2016-04-29 DIAGNOSIS — Z79899 Other long term (current) drug therapy: Secondary | ICD-10-CM | POA: Diagnosis not present

## 2016-04-29 DIAGNOSIS — K50111 Crohn's disease of large intestine with rectal bleeding: Secondary | ICD-10-CM | POA: Insufficient documentation

## 2016-04-29 LAB — COMPREHENSIVE METABOLIC PANEL
ALBUMIN: 4.1 g/dL (ref 3.5–5.0)
ALT: 11 U/L — ABNORMAL LOW (ref 17–63)
ANION GAP: 8 (ref 5–15)
AST: 17 U/L (ref 15–41)
Alkaline Phosphatase: 79 U/L (ref 38–126)
BUN: 10 mg/dL (ref 6–20)
CHLORIDE: 108 mmol/L (ref 101–111)
CO2: 24 mmol/L (ref 22–32)
Calcium: 8.9 mg/dL (ref 8.9–10.3)
Creatinine, Ser: 0.88 mg/dL (ref 0.61–1.24)
GFR calc Af Amer: >60 mL/min (ref >60)
GFR calc non Af Amer: >60 mL/min (ref >60)
GLUCOSE: 143 mg/dL — AB (ref 65–99)
POTASSIUM: 3.4 mmol/L — AB (ref 3.5–5.1)
SODIUM: 140 mmol/L (ref 135–145)
Total Bilirubin: 0.8 mg/dL (ref 0.3–1.2)
Total Protein: 7.1 g/dL (ref 6.5–8.1)

## 2016-04-29 LAB — CBC
HEMATOCRIT: 43.6 % (ref 40.0–52.0)
HEMOGLOBIN: 15 g/dL (ref 13.0–18.0)
MCH: 28.9 pg (ref 26.0–34.0)
MCHC: 34.3 g/dL (ref 32.0–36.0)
MCV: 84.5 fL (ref 80.0–100.0)
Platelets: 256 10*3/uL (ref 150–440)
RBC: 5.17 MIL/uL (ref 4.40–5.90)
RDW: 16.4 % — ABNORMAL HIGH (ref 11.5–14.5)
WBC: 5.2 10*3/uL (ref 3.8–10.6)

## 2016-04-29 LAB — LIPASE, BLOOD: Lipase: 32 U/L (ref 11–51)

## 2016-04-29 LAB — URINALYSIS, COMPLETE (UACMP) WITH MICROSCOPIC
BACTERIA UA: NONE SEEN
Bilirubin Urine: NEGATIVE
Glucose, UA: NEGATIVE mg/dL
HGB URINE DIPSTICK: NEGATIVE
Ketones, ur: NEGATIVE mg/dL
Leukocytes, UA: NEGATIVE
NITRITE: NEGATIVE
PROTEIN: NEGATIVE mg/dL
RBC / HPF: NONE SEEN RBC/hpf (ref 0–5)
Specific Gravity, Urine: 1.025 (ref 1.005–1.030)
pH: 5 (ref 5.0–8.0)

## 2016-04-29 MED ORDER — SODIUM CHLORIDE 0.9 % IV BOLUS (SEPSIS)
1000.0000 mL | Freq: Once | INTRAVENOUS | Status: AC
  Administered 2016-04-29: 1000 mL via INTRAVENOUS

## 2016-04-29 MED ORDER — KETOROLAC TROMETHAMINE 30 MG/ML IJ SOLN
10.0000 mg | Freq: Once | INTRAMUSCULAR | Status: AC
  Administered 2016-04-29: 9.9 mg via INTRAVENOUS
  Filled 2016-04-29: qty 1

## 2016-04-29 MED ORDER — PREDNISONE 20 MG PO TABS
60.0000 mg | ORAL_TABLET | Freq: Once | ORAL | Status: AC
  Administered 2016-04-29: 60 mg via ORAL
  Filled 2016-04-29: qty 3

## 2016-04-29 MED ORDER — PREDNISONE 10 MG PO TABS: ORAL_TABLET | ORAL | 0 refills | Status: DC

## 2016-04-29 MED ORDER — MORPHINE SULFATE (PF) 4 MG/ML IV SOLN
4.0000 mg | Freq: Once | INTRAVENOUS | Status: AC
  Administered 2016-04-29: 4 mg via INTRAVENOUS
  Filled 2016-04-29: qty 1

## 2016-04-29 MED ORDER — IOPAMIDOL (ISOVUE-300) INJECTION 61%
30.0000 mL | Freq: Once | INTRAVENOUS | Status: AC
  Administered 2016-04-29: 30 mL via ORAL

## 2016-04-29 NOTE — ED Triage Notes (Signed)
Patient presents to ED via POV with c/o abdominal pain x 1 week. Hx of crohn's disease.

## 2016-04-29 NOTE — ED Provider Notes (Signed)
[facility] Emergency Department Provider Note    First MD Initiated Contact with Patient 04/29/16 2245     (approximate)  I have reviewed the triage vital signs and the nursing notes.   HISTORY  Chief Complaint Abdominal Pain    HPI Leroy Hernandez is a 31 y.o. male with history of Crohn's disease with recent treatmentchanged to sell tach from Humira. Not currently on any prednisone. Patient started having crampy abdominal pain as diffuse as well as large amounts of bloody diarrhea for the past 5 days. No measured fevers. Denies any nausea or vomiting.   Past Medical History:  Diagnosis Date  . Anxiety   . Asthma   . Crohn's disease ([place])   . Rectal fistula    Family History  Problem Relation Age of Onset  . Diabetes Paternal Grandfather   . Heart disease Paternal Grandfather    Past Surgical History:  Procedure Laterality Date  . COLON RESECTION    . COLON SURGERY    . COLONOSCOPY WITH PROPOFOL N/A 04/03/2015   Procedure: COLONOSCOPY WITH PROPOFOL;  Surgeon: Hulen Luster, MD;  Location: [facility] ENDOSCOPY;  Service: Endoscopy;  Laterality: N/A;  . COLONOSCOPY WITH PROPOFOL N/A 12/22/2015   Procedure: COLONOSCOPY WITH PROPOFOL;  Surgeon: [name], MD;  Location: [place];  Service: Endoscopy;  Laterality: N/A;  . rectal fistula surgery    . WRIST SURGERY     Patient Active Problem List   Diagnosis Date Noted  . Calculus of gallbladder without cholecystitis without obstruction 12/25/2015  . Gallbladder polyp 12/25/2015  . Blood in stool   . Intestinal bypass or anastomosis status   . Crohn's disease of both small and large intestine with rectal bleeding ([place])   . Crohn's disease ([place]) 08/22/2015  . Mild episode of recurrent major depressive disorder ([place]) 04/02/2015  . Asthma, mild intermittent 04/02/2015  . Crohn's disease of colon ([place]) 02/06/2015      Prior to Admission medications   Medication Sig Start Date End Date  Taking? Authorizing Provider  azaTHIOprine (IMURAN) 50 MG tablet Take three tablets by mouth daily 08/22/15   [name], MD  escitalopram (LEXAPRO) 5 MG tablet Take 1 tablet by mouth daily. 04/02/15   Historical Provider, MD  HUMIRA PEN 40 MG/0.8ML PNKT Inject 1 Syringe as directed every 14 (fourteen) days. 10/09/15   [name], MD  naproxen (NAPROSYN) 500 MG tablet Take 1 tablet (500 mg total) by mouth 2 (two) times daily with a meal. 03/15/16   Cari B Triplett, FNP  polyethylene glycol (GOLYTELY) 236 g solution Drink one 8 oz glass every 20 mins until stools are clear. 12/19/15   [name], MD  predniSONE (DELTASONE) 10 MG tablet Take 18m daily x 2 weeks, then 379mdaily x 2 weeks, then 2066maily x 2 weeks, then 62m27mily x 2 weeks. 04/29/16   PatrMerlyn Lot  traMADol (ULTRAM) 50 MG tablet Take 1 tablet (50 mg total) by mouth every 6 (six) hours as needed. 12/24/15   AlliLoney Hering    Allergies Patient has no known allergies.    Social History Social History  Substance Use Topics  . Smoking status: Never Smoker  . Smokeless tobacco: Former UserSystems developerAlcohol use 1.2 oz/week    2 Cans of beer per week    Review of Systems Patient denies headaches, rhinorrhea, blurry vision, numbness, shortness of breath, chest pain, edema, cough, abdominal pain, nausea, vomiting, diarrhea, dysuria, fevers,  rashes or hallucinations unless otherwise stated above in HPI. ____________________________________________   PHYSICAL EXAM:  VITAL SIGNS: Vitals:   04/29/16 2110  BP: 133/73  Pulse: 90  Resp: 16  Temp: 98.1 F (36.7 C)    Constitutional: Alert and oriented. Well appearing and in no acute distress. Eyes: Conjunctivae are normal. PERRL. EOMI. Head: Atraumatic. Nose: No congestion/rhinnorhea. Mouth/Throat: Mucous membranes are moist.  Oropharynx non-erythematous. Neck: No stridor. Painless ROM. No cervical spine tenderness to  palpation Hematological/Lymphatic/Immunilogical: No cervical lymphadenopathy. Cardiovascular: Normal rate, regular rhythm. Grossly normal heart sounds.  Good peripheral circulation. Respiratory: Normal respiratory effort.  No retractions. Lungs CTAB. Gastrointestinal: Soft and nontender. No distention. No abdominal bruits. No CVA tenderness. Genitourinary: no perirectal abscess. No proctitis Musculoskeletal: No lower extremity tenderness nor edema.  No joint effusions. Neurologic:  Normal speech and language. No gross focal neurologic deficits are appreciated. No gait instability. Skin:  Skin is warm, dry and intact. No rash noted. Psychiatric: Mood and affect are normal. Speech and behavior are normal.  ____________________________________________   LABS (all labs ordered are listed, but only abnormal results are displayed)  Results for orders placed or performed during the hospital encounter of 04/29/16 (from the past 24 hour(s))  Lipase, blood     Status: None   Collection Time: 04/29/16  9:08 PM  Result Value Ref Range   Lipase 32 11 - 51 U/L  Comprehensive metabolic panel     Status: Abnormal   Collection Time: 04/29/16  9:08 PM  Result Value Ref Range   Sodium 140 135 - 145 mmol/L   Potassium 3.4 (L) 3.5 - 5.1 mmol/L   Chloride 108 101 - 111 mmol/L   CO2 24 22 - 32 mmol/L   Glucose, Bld 143 (H) 65 - 99 mg/dL   BUN 10 6 - 20 mg/dL   Creatinine, Ser 0.88 0.61 - 1.24 mg/dL   Calcium 8.9 8.9 - 10.3 mg/dL   Total Protein 7.1 6.5 - 8.1 g/dL   Albumin 4.1 3.5 - 5.0 g/dL   AST 17 15 - 41 U/L   ALT 11 (L) 17 - 63 U/L   Alkaline Phosphatase 79 38 - 126 U/L   Total Bilirubin 0.8 0.3 - 1.2 mg/dL   GFR calc non Af Amer >60 >60 mL/min   GFR calc Af Amer >60 >60 mL/min   Anion gap 8 5 - 15  CBC     Status: Abnormal   Collection Time: 04/29/16  9:08 PM  Result Value Ref Range   WBC 5.2 3.8 - 10.6 K/uL   RBC 5.17 4.40 - 5.90 MIL/uL   Hemoglobin 15.0 13.0 - 18.0 g/dL   HCT 43.6  40.0 - 52.0 %   MCV 84.5 80.0 - 100.0 fL   MCH 28.9 26.0 - 34.0 pg   MCHC 34.3 32.0 - 36.0 g/dL   RDW 16.4 (H) 11.5 - 14.5 %   Platelets 256 150 - 440 K/uL  Urinalysis, Complete w Microscopic     Status: Abnormal   Collection Time: 04/29/16  9:08 PM  Result Value Ref Range   Color, Urine YELLOW (A) YELLOW   APPearance CLEAR (A) CLEAR   Specific Gravity, Urine 1.025 1.005 - 1.030   pH 5.0 5.0 - 8.0   Glucose, UA NEGATIVE NEGATIVE mg/dL   Hgb urine dipstick NEGATIVE NEGATIVE   Bilirubin Urine NEGATIVE NEGATIVE   Ketones, ur NEGATIVE NEGATIVE mg/dL   Protein, ur NEGATIVE NEGATIVE mg/dL   Nitrite NEGATIVE NEGATIVE   Leukocytes, UA  NEGATIVE NEGATIVE   RBC / HPF NONE SEEN 0 - 5 RBC/hpf   WBC, UA 0-5 0 - 5 WBC/hpf   Bacteria, UA NONE SEEN NONE SEEN   Squamous Epithelial / LPF 0-5 (A) NONE SEEN   Mucous PRESENT    ____________________________________________  EKG____________________________________________  RADIOLOGY  I personally reviewed all radiographic images ordered to evaluate for the above acute complaints and reviewed radiology reports and findings.  These findings were personally discussed with the patient.  Please see medical record for radiology report.  ____________________________________________   PROCEDURES  Procedure(s) performed:  Procedures    Critical Care performed: no ____________________________________________   INITIAL IMPRESSION / ASSESSMENT AND PLAN / ED COURSE  Pertinent labs & imaging results that were available during my care of the patient were reviewed by me and considered in my medical decision making (see chart for details).  DDX: crohns colitis, abscess, fistula, proctitis  Leroy Hernandez is a 31 y.o. who presents to the ED with crampy abdominal pain with hematochezia in the setting of his Crohn's disease. Patient afebrile hemodynamic stable. Blood work is reassuring however the patient is on immunomodulating toward medication. Based  on his presentation I do feel CT imaging clinically indicated at this time. We'll provide IV fluids as well as pain medication. Patient will be signed out Dr. Owens Shark pending results of CT imaging.  I do anticipate the patient will be stable for discharge pending no evidence of surgical complication and the patient is able to tolerate oral hydration.      ____________________________________________   FINAL CLINICAL IMPRESSION(S) / ED DIAGNOSES  Final diagnoses:  Generalized abdominal pain  Hematochezia  Crohn's disease of colon with rectal bleeding ([place])      NEW MEDICATIONS STARTED DURING THIS VISIT:  Current Discharge Medication List       Note:  This document was prepared using Dragon voice recognition software and may include unintentional dictation errors.    Merlyn Lot, MD 04/29/16 2322

## 2016-04-30 ENCOUNTER — Emergency Department: Payer: BC Managed Care – PPO

## 2016-04-30 ENCOUNTER — Encounter: Payer: Self-pay | Admitting: Radiology

## 2016-04-30 MED ORDER — IOPAMIDOL (ISOVUE-300) INJECTION 61%
100.0000 mL | Freq: Once | INTRAVENOUS | Status: AC | PRN
  Administered 2016-04-30: 100 mL via INTRAVENOUS

## 2016-05-26 ENCOUNTER — Emergency Department
Admission: EM | Admit: 2016-05-26 | Discharge: 2016-05-27 | Disposition: A | Discharge status: Home / Self Care | Payer: BC Managed Care – PPO | Attending: Emergency Medicine | Admitting: Emergency Medicine

## 2016-05-26 ENCOUNTER — Encounter: Payer: Self-pay | Admitting: Emergency Medicine

## 2016-05-26 ENCOUNTER — Emergency Department: Payer: BC Managed Care – PPO

## 2016-05-26 DIAGNOSIS — R456 Violent behavior: Secondary | ICD-10-CM | POA: Diagnosis not present

## 2016-05-26 DIAGNOSIS — S6992XA Unspecified injury of left wrist, hand and finger(s), initial encounter: Secondary | ICD-10-CM | POA: Diagnosis present

## 2016-05-26 DIAGNOSIS — Y929 Unspecified place or not applicable: Secondary | ICD-10-CM | POA: Diagnosis not present

## 2016-05-26 DIAGNOSIS — Y999 Unspecified external cause status: Secondary | ICD-10-CM | POA: Diagnosis not present

## 2016-05-26 DIAGNOSIS — F419 Anxiety disorder, unspecified: Secondary | ICD-10-CM

## 2016-05-26 DIAGNOSIS — J45909 Unspecified asthma, uncomplicated: Secondary | ICD-10-CM | POA: Insufficient documentation

## 2016-05-26 DIAGNOSIS — Y9389 Activity, other specified: Secondary | ICD-10-CM | POA: Diagnosis not present

## 2016-05-26 DIAGNOSIS — Z79899 Other long term (current) drug therapy: Secondary | ICD-10-CM | POA: Diagnosis not present

## 2016-05-26 DIAGNOSIS — Z87891 Personal history of nicotine dependence: Secondary | ICD-10-CM | POA: Diagnosis not present

## 2016-05-26 DIAGNOSIS — S62327A Displaced fracture of shaft of fifth metacarpal bone, left hand, initial encounter for closed fracture: Secondary | ICD-10-CM

## 2016-05-26 DIAGNOSIS — W2203XA Walked into furniture, initial encounter: Secondary | ICD-10-CM | POA: Insufficient documentation

## 2016-05-26 LAB — URINE DRUG SCREEN, QUALITATIVE (ARMC ONLY)
AMPHETAMINES, UR SCREEN: NOT DETECTED
Barbiturates, Ur Screen: NOT DETECTED
Benzodiazepine, Ur Scrn: NOT DETECTED
CANNABINOID 50 NG, UR ~~LOC~~: NOT DETECTED
COCAINE METABOLITE, UR ~~LOC~~: NOT DETECTED
MDMA (ECSTASY) UR SCREEN: NOT DETECTED
Methadone Scn, Ur: NOT DETECTED
Opiate, Ur Screen: NOT DETECTED
PHENCYCLIDINE (PCP) UR S: NOT DETECTED
Tricyclic, Ur Screen: NOT DETECTED

## 2016-05-26 LAB — CBC
HEMATOCRIT: 44.4 % (ref 40.0–52.0)
HEMOGLOBIN: 14.7 g/dL (ref 13.0–18.0)
MCH: 28.3 pg (ref 26.0–34.0)
MCHC: 33.2 g/dL (ref 32.0–36.0)
MCV: 85.2 fL (ref 80.0–100.0)
PLATELETS: 219 10*3/uL (ref 150–440)
RBC: 5.21 MIL/uL (ref 4.40–5.90)
RDW: 15.9 % — ABNORMAL HIGH (ref 11.5–14.5)
WBC: 12.7 10*3/uL — AB (ref 3.8–10.6)

## 2016-05-26 LAB — SALICYLATE LEVEL: Salicylate Lvl: <7 mg/dL (ref 2.8–30.0)

## 2016-05-26 LAB — COMPREHENSIVE METABOLIC PANEL
ALBUMIN: 4.3 g/dL (ref 3.5–5.0)
ALT: 22 U/L (ref 17–63)
AST: 26 U/L (ref 15–41)
Alkaline Phosphatase: 65 U/L (ref 38–126)
Anion gap: 9 (ref 5–15)
BUN: 12 mg/dL (ref 6–20)
CHLORIDE: 110 mmol/L (ref 101–111)
CO2: 23 mmol/L (ref 22–32)
CREATININE: 0.79 mg/dL (ref 0.61–1.24)
Calcium: 9.4 mg/dL (ref 8.9–10.3)
GFR calc Af Amer: >60 mL/min (ref >60)
GLUCOSE: 116 mg/dL — AB (ref 65–99)
POTASSIUM: 3.7 mmol/L (ref 3.5–5.1)
Sodium: 142 mmol/L (ref 135–145)
Total Bilirubin: 1 mg/dL (ref 0.3–1.2)
Total Protein: 7.5 g/dL (ref 6.5–8.1)

## 2016-05-26 LAB — ACETAMINOPHEN LEVEL: Acetaminophen (Tylenol), Serum: <10 ug/mL — ABNORMAL LOW (ref 10–30)

## 2016-05-26 LAB — ETHANOL: Alcohol, Ethyl (B): 32 mg/dL — ABNORMAL HIGH (ref <5)
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
## 2016-05-27 MED ORDER — BUSPIRONE HCL 5 MG PO TABS: 5.0000 mg | ORAL_TABLET | Freq: Two times a day (BID) | ORAL | 0 refills | Status: DC

## 2016-05-27 NOTE — Discharge Instructions (Addendum)
You have been seen in the Emergency Department (ED) today for a psychiatric complaint.  You have been evaluated by psychiatry and we believe you are safe to be discharged from the hospital.    Please return to the ED immediately if you have ANY thoughts of hurting yourself or anyone else, so that we may help you.  Please avoid alcohol and drug use.  Follow up with your doctor and/or therapist as soon as possible regarding today's ED visit.   **REFER TO THE INCLUDED INFORMATION ABOUT [facility]**:  [address], [place] [address] Phone:  [phone] or [phone]  Open Access:   Walk-in ASSESSMENT hours, M-W-F, 8:00am - 3:00pm Advanced Acess CRISIS:  M-F, 8:00am - 8:00pm Outpatient Services Office Hours:  M-F, 8:00am - 5:00pm   Additionally, you need to follow up with Dr. Jackqulyn Livings or one of her colleagues in about a week regarding your hand fracture.  Use ice packs on both hands to reduce swelling and use any pain medication you have at home.

## 2016-06-09 DIAGNOSIS — S62309A Unspecified fracture of unspecified metacarpal bone, initial encounter for closed fracture: Secondary | ICD-10-CM | POA: Insufficient documentation

## 2016-06-22 ENCOUNTER — Emergency Department: Payer: BC Managed Care – PPO

## 2016-06-22 ENCOUNTER — Inpatient Hospital Stay: Payer: BC Managed Care – PPO

## 2016-06-22 ENCOUNTER — Inpatient Hospital Stay
Admission: EM | Admit: 2016-06-22 | Discharge: 2016-06-25 | DRG: 872 | Disposition: A | Discharge status: Home / Self Care | Payer: BC Managed Care – PPO | Attending: Internal Medicine | Admitting: Internal Medicine

## 2016-06-22 ENCOUNTER — Encounter: Payer: Self-pay | Admitting: Emergency Medicine

## 2016-06-22 DIAGNOSIS — E876 Hypokalemia: Secondary | ICD-10-CM | POA: Diagnosis present

## 2016-06-22 DIAGNOSIS — F329 Major depressive disorder, single episode, unspecified: Secondary | ICD-10-CM | POA: Diagnosis present

## 2016-06-22 DIAGNOSIS — J45909 Unspecified asthma, uncomplicated: Secondary | ICD-10-CM | POA: Diagnosis present

## 2016-06-22 DIAGNOSIS — R000 Tachycardia, unspecified: Secondary | ICD-10-CM

## 2016-06-22 DIAGNOSIS — K611 Rectal abscess: Secondary | ICD-10-CM | POA: Diagnosis present

## 2016-06-22 DIAGNOSIS — A419 Sepsis, unspecified organism: Secondary | ICD-10-CM | POA: Diagnosis not present

## 2016-06-22 DIAGNOSIS — Z87891 Personal history of nicotine dependence: Secondary | ICD-10-CM | POA: Diagnosis not present

## 2016-06-22 DIAGNOSIS — Z79899 Other long term (current) drug therapy: Secondary | ICD-10-CM

## 2016-06-22 DIAGNOSIS — K5090 Crohn's disease, unspecified, without complications: Secondary | ICD-10-CM | POA: Diagnosis present

## 2016-06-22 DIAGNOSIS — K8020 Calculus of gallbladder without cholecystitis without obstruction: Secondary | ICD-10-CM | POA: Diagnosis present

## 2016-06-22 DIAGNOSIS — R740 Nonspecific elevation of levels of transaminase and lactic acid dehydrogenase [LDH]: Secondary | ICD-10-CM | POA: Diagnosis present

## 2016-06-22 DIAGNOSIS — K50919 Crohn's disease, unspecified, with unspecified complications: Secondary | ICD-10-CM | POA: Diagnosis not present

## 2016-06-22 DIAGNOSIS — K604 Rectal fistula, unspecified: Secondary | ICD-10-CM

## 2016-06-22 DIAGNOSIS — R1084 Generalized abdominal pain: Secondary | ICD-10-CM

## 2016-06-22 DIAGNOSIS — M549 Dorsalgia, unspecified: Secondary | ICD-10-CM | POA: Diagnosis present

## 2016-06-22 DIAGNOSIS — F419 Anxiety disorder, unspecified: Secondary | ICD-10-CM | POA: Diagnosis present

## 2016-06-22 DIAGNOSIS — R7989 Other specified abnormal findings of blood chemistry: Secondary | ICD-10-CM

## 2016-06-22 HISTORY — DX: Sepsis, unspecified organism: A41.9

## 2016-06-22 LAB — CBC WITH DIFFERENTIAL/PLATELET
BASOS ABS: 0 10*3/uL (ref 0–0.1)
Basophils Relative: 0 %
EOS ABS: 0 10*3/uL (ref 0–0.7)
EOS PCT: 0 %
HCT: 44.1 % (ref 40.0–52.0)
Hemoglobin: 14.9 g/dL (ref 13.0–18.0)
LYMPHS ABS: 0.6 10*3/uL — AB (ref 1.0–3.6)
Lymphocytes Relative: 4 %
MCH: 28.6 pg (ref 26.0–34.0)
MCHC: 33.8 g/dL (ref 32.0–36.0)
MCV: 84.5 fL (ref 80.0–100.0)
MONO ABS: 1.6 10*3/uL — AB (ref 0.2–1.0)
Monocytes Relative: 11 %
Neutro Abs: 12.9 10*3/uL — ABNORMAL HIGH (ref 1.4–6.5)
Neutrophils Relative %: 85 %
PLATELETS: 234 10*3/uL (ref 150–440)
RBC: 5.22 MIL/uL (ref 4.40–5.90)
RDW: 14.8 % — AB (ref 11.5–14.5)
WBC: 15.2 10*3/uL — AB (ref 3.8–10.6)

## 2016-06-22 LAB — COMPREHENSIVE METABOLIC PANEL
ALK PHOS: 78 U/L (ref 38–126)
ALT: 15 U/L — ABNORMAL LOW (ref 17–63)
ANION GAP: 10 (ref 5–15)
AST: 30 U/L (ref 15–41)
Albumin: 4.5 g/dL (ref 3.5–5.0)
BILIRUBIN TOTAL: 1.4 mg/dL — AB (ref 0.3–1.2)
BUN: 10 mg/dL (ref 6–20)
CALCIUM: 9.2 mg/dL (ref 8.9–10.3)
CO2: 23 mmol/L (ref 22–32)
CREATININE: 0.96 mg/dL (ref 0.61–1.24)
Chloride: 101 mmol/L (ref 101–111)
GFR calc non Af Amer: >60 mL/min (ref >60)
Glucose, Bld: 125 mg/dL — ABNORMAL HIGH (ref 65–99)
Potassium: 2.9 mmol/L — ABNORMAL LOW (ref 3.5–5.1)
Sodium: 134 mmol/L — ABNORMAL LOW (ref 135–145)
TOTAL PROTEIN: 7.7 g/dL (ref 6.5–8.1)

## 2016-06-22 LAB — LACTIC ACID, PLASMA
LACTIC ACID, VENOUS: 1 mmol/L (ref 0.5–1.9)
Lactic Acid, Venous: 2.8 mmol/L (ref 0.5–1.9)

## 2016-06-22 LAB — URINALYSIS, COMPLETE (UACMP) WITH MICROSCOPIC
Bacteria, UA: NONE SEEN
Bilirubin Urine: NEGATIVE
GLUCOSE, UA: NEGATIVE mg/dL
HGB URINE DIPSTICK: NEGATIVE
Ketones, ur: 5 mg/dL — AB
Leukocytes, UA: NEGATIVE
Nitrite: NEGATIVE
Protein, ur: NEGATIVE mg/dL
RBC / HPF: NONE SEEN RBC/hpf (ref 0–5)
SPECIFIC GRAVITY, URINE: 1.008 (ref 1.005–1.030)
SQUAMOUS EPITHELIAL / LPF: NONE SEEN
WBC, UA: NONE SEEN WBC/hpf (ref 0–5)
pH: 5 (ref 5.0–8.0)

## 2016-06-22 LAB — TYPE AND SCREEN
ABO/RH(D): O NEG
Antibody Screen: NEGATIVE

## 2016-06-22 LAB — PROTIME-INR
INR: 0.96
Prothrombin Time: 12.8 seconds (ref 11.4–15.2)

## 2016-06-22 MED ORDER — PREDNISONE 20 MG PO TABS
40.0000 mg | ORAL_TABLET | Freq: Every day | ORAL | Status: DC
  Administered 2016-06-24 – 2016-06-25 (×2): 40 mg via ORAL
  Filled 2016-06-22 (×2): qty 2

## 2016-06-22 MED ORDER — PIPERACILLIN-TAZOBACTAM 4.5 G IVPB
4.5000 g | Freq: Three times a day (TID) | INTRAVENOUS | Status: DC
Start: 2016-06-23 — End: 2016-06-23
  Administered 2016-06-23: 4.5 g via INTRAVENOUS
  Filled 2016-06-22 (×4): qty 100

## 2016-06-22 MED ORDER — MORPHINE SULFATE (PF) 4 MG/ML IV SOLN
4.0000 mg | Freq: Once | INTRAVENOUS | Status: AC
  Administered 2016-06-22: 4 mg via INTRAVENOUS

## 2016-06-22 MED ORDER — MORPHINE SULFATE (PF) 4 MG/ML IV SOLN
INTRAVENOUS | Status: AC
  Administered 2016-06-22: 4 mg via INTRAVENOUS
  Filled 2016-06-22: qty 1

## 2016-06-22 MED ORDER — IOPAMIDOL (ISOVUE-300) INJECTION 61%
30.0000 mL | Freq: Once | INTRAVENOUS | Status: AC | PRN
  Administered 2016-06-22: 30 mL via ORAL

## 2016-06-22 MED ORDER — VANCOMYCIN HCL IN DEXTROSE 1-5 GM/200ML-% IV SOLN
1000.0000 mg | Freq: Once | INTRAVENOUS | Status: AC
  Administered 2016-06-22: 1000 mg via INTRAVENOUS
  Filled 2016-06-22: qty 200

## 2016-06-22 MED ORDER — BUSPIRONE HCL 10 MG PO TABS
5.0000 mg | ORAL_TABLET | Freq: Two times a day (BID) | ORAL | Status: DC
  Administered 2016-06-23 – 2016-06-25 (×6): 5 mg via ORAL
  Filled 2016-06-22 (×6): qty 1

## 2016-06-22 MED ORDER — ONDANSETRON HCL 4 MG PO TABS: 4.0000 mg | ORAL_TABLET | Freq: Four times a day (QID) | ORAL | Status: DC | PRN

## 2016-06-22 MED ORDER — AZATHIOPRINE 50 MG PO TABS
150.0000 mg | ORAL_TABLET | Freq: Every day | ORAL | Status: DC
  Administered 2016-06-23 – 2016-06-25 (×3): 150 mg via ORAL
  Filled 2016-06-22 (×3): qty 3

## 2016-06-22 MED ORDER — SODIUM CHLORIDE 0.9 % IV SOLN
INTRAVENOUS | Status: DC
  Administered 2016-06-23 – 2016-06-25 (×6): via INTRAVENOUS

## 2016-06-22 MED ORDER — SENNOSIDES-DOCUSATE SODIUM 8.6-50 MG PO TABS: 1.0000 | ORAL_TABLET | Freq: Every evening | ORAL | Status: DC | PRN

## 2016-06-22 MED ORDER — ACETAMINOPHEN 325 MG PO TABS
650.0000 mg | ORAL_TABLET | Freq: Four times a day (QID) | ORAL | Status: DC | PRN
  Administered 2016-06-23 (×2): 650 mg via ORAL
  Filled 2016-06-22 (×2): qty 2

## 2016-06-22 MED ORDER — ONDANSETRON HCL 4 MG/2ML IJ SOLN
4.0000 mg | Freq: Four times a day (QID) | INTRAMUSCULAR | Status: DC | PRN
  Filled 2016-06-22: qty 2

## 2016-06-22 MED ORDER — OXYCODONE HCL 5 MG PO TABS
5.0000 mg | ORAL_TABLET | ORAL | Status: DC | PRN
  Administered 2016-06-23 – 2016-06-25 (×6): 5 mg via ORAL
  Filled 2016-06-22 (×6): qty 1

## 2016-06-22 MED ORDER — IOPAMIDOL (ISOVUE-300) INJECTION 61%
100.0000 mL | Freq: Once | INTRAVENOUS | Status: AC | PRN
  Administered 2016-06-22: 100 mL via INTRAVENOUS

## 2016-06-22 MED ORDER — POTASSIUM CHLORIDE CRYS ER 20 MEQ PO TBCR
40.0000 meq | EXTENDED_RELEASE_TABLET | Freq: Once | ORAL | Status: AC
  Administered 2016-06-22: 40 meq via ORAL
  Filled 2016-06-22: qty 2

## 2016-06-22 MED ORDER — IPRATROPIUM BROMIDE 0.02 % IN SOLN: 0.5000 mg | Freq: Four times a day (QID) | RESPIRATORY_TRACT | Status: DC | PRN

## 2016-06-22 MED ORDER — ONDANSETRON HCL 4 MG/2ML IJ SOLN
4.0000 mg | Freq: Once | INTRAMUSCULAR | Status: AC
  Administered 2016-06-22: 4 mg via INTRAVENOUS

## 2016-06-22 MED ORDER — PIPERACILLIN-TAZOBACTAM 3.375 G IVPB 30 MIN: 3.3750 g | Freq: Once | INTRAVENOUS | Status: DC

## 2016-06-22 MED ORDER — BISACODYL 5 MG PO TBEC: 5.0000 mg | DELAYED_RELEASE_TABLET | Freq: Every day | ORAL | Status: DC | PRN

## 2016-06-22 MED ORDER — MORPHINE SULFATE (PF) 2 MG/ML IV SOLN
1.0000 mg | INTRAVENOUS | Status: DC | PRN
  Administered 2016-06-23 – 2016-06-24 (×6): 1 mg via INTRAVENOUS
  Filled 2016-06-22 (×6): qty 1

## 2016-06-22 MED ORDER — PIPERACILLIN-TAZOBACTAM 3.375 G IVPB 30 MIN
3.3750 g | Freq: Once | INTRAVENOUS | Status: AC
  Administered 2016-06-22: 3.375 g via INTRAVENOUS
  Filled 2016-06-22: qty 50

## 2016-06-22 MED ORDER — ACETAMINOPHEN 650 MG RE SUPP: 650.0000 mg | Freq: Four times a day (QID) | RECTAL | Status: DC | PRN

## 2016-06-22 MED ORDER — MAGNESIUM CITRATE PO SOLN
1.0000 | Freq: Once | ORAL | Status: DC | PRN
  Filled 2016-06-22: qty 296

## 2016-06-22 MED ORDER — ALBUTEROL SULFATE (2.5 MG/3ML) 0.083% IN NEBU: 2.5000 mg | INHALATION_SOLUTION | Freq: Four times a day (QID) | RESPIRATORY_TRACT | Status: DC | PRN

## 2016-06-22 MED ORDER — ESCITALOPRAM OXALATE 10 MG PO TABS
10.0000 mg | ORAL_TABLET | Freq: Every day | ORAL | Status: DC
Start: 2016-06-23 — End: 2016-06-25
  Administered 2016-06-23 – 2016-06-25 (×3): 10 mg via ORAL
  Filled 2016-06-22 (×3): qty 1

## 2016-06-22 MED ORDER — SODIUM CHLORIDE 0.9 % IV BOLUS (SEPSIS)
1000.0000 mL | Freq: Once | INTRAVENOUS | Status: AC
  Administered 2016-06-22: 1000 mL via INTRAVENOUS

## 2016-06-22 MED ORDER — ONDANSETRON HCL 4 MG/2ML IJ SOLN
INTRAMUSCULAR | Status: AC
  Administered 2016-06-22: 4 mg via INTRAVENOUS
  Filled 2016-06-22: qty 2

## 2016-06-22 MED ORDER — ZOLPIDEM TARTRATE 5 MG PO TABS: 5.0000 mg | ORAL_TABLET | Freq: Every evening | ORAL | Status: DC | PRN
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
## 2016-06-22 NOTE — ED Notes (Signed)
Patient transported to CT 

## 2016-06-22 NOTE — ED Provider Notes (Signed)
[facility] Emergency Department Provider Note  ____________________________________________   I have reviewed the triage vital signs and the nursing notes.   HISTORY  Chief Complaint Diarrhea and Back Pain   History limited by: Not Limited   HPI Leroy Hernandez is a 31 y.o. male who presents to the emergency department today because of concerns for right-sided abdominal pain, fever and bloody diarrhea. He states he has been having the diarrhea for the past few days. Has multiple episodes a day. This has been accompanied by lower and right-sided abdominal pain. The patient has a history of Crohn's disease. States it has been fairly well controlled over the past couple of month.   Past Medical History:  Diagnosis Date  . Anxiety   . Asthma   . Crohn's disease ([place])   . Rectal fistula     Patient Active Problem List   Diagnosis Date Noted  . Calculus of gallbladder without cholecystitis without obstruction 12/25/2015  . Gallbladder polyp 12/25/2015  . Blood in stool   . Intestinal bypass or anastomosis status   . Crohn's disease of both small and large intestine with rectal bleeding ([place])   . Crohn's disease ([place]) 08/22/2015  . Mild episode of recurrent major depressive disorder ([place]) 04/02/2015  . Asthma, mild intermittent 04/02/2015  . Crohn's disease of colon ([place]) 02/06/2015    Past Surgical History:  Procedure Laterality Date  . COLON RESECTION    . COLON SURGERY    . COLONOSCOPY WITH PROPOFOL N/A 04/03/2015   Procedure: COLONOSCOPY WITH PROPOFOL;  Surgeon: Hulen Luster, MD;  Location: [facility] ENDOSCOPY;  Service: Endoscopy;  Laterality: N/A;  . COLONOSCOPY WITH PROPOFOL N/A 12/22/2015   Procedure: COLONOSCOPY WITH PROPOFOL;  Surgeon: [name], MD;  Location: [place];  Service: Endoscopy;  Laterality: N/A;  . rectal fistula surgery    . WRIST SURGERY      Prior to Admission medications   Medication Sig Start Date End Date  Taking? Authorizing Provider  azaTHIOprine (IMURAN) 50 MG tablet Take three tablets by mouth daily 08/22/15  Yes [name], MD  busPIRone (BUSPAR) 5 MG tablet Take 1 tablet (5 mg total) by mouth 2 (two) times daily. 05/27/16  Yes Hinda Kehr, MD  PROAIR HFA 108 [phone]) MCG/ACT inhaler Inhale 2 puffs into the lungs every 4 (four) hours as needed. 02/27/16  Yes [provider]  CIMZIA PREFILLED 2 X 200 MG/ML KIT Inject 1 Dose into the muscle every 28 (twenty-eight) days. 05/25/16   [provider]  escitalopram (LEXAPRO) 5 MG tablet Take 10 mg by mouth daily.  04/02/15   [provider]  metaxalone (SKELAXIN) 800 MG tablet Take 1 tablet by mouth 3 (three) times daily. 05/26/16   [provider]  predniSONE (DELTASONE) 10 MG tablet Take 67m daily x 2 weeks, then 343mdaily x 2 weeks, then 2055maily x 2 weeks, then 47m15mily x 2 weeks. 04/29/16   RobiMerlyn Lot  traMADol (ULTRAM) 50 MG tablet Take 1 tablet (50 mg total) by mouth every 6 (six) hours as needed. Patient not taking: Reported on 06/22/2016 12/24/15   WebsLoney Hering    Allergies Patient has no known allergies.  Family History  Problem Relation Age of Onset  . Diabetes Paternal Grandfather   . Heart disease Paternal Grandfather     Social History Social History  Substance Use Topics  . Smoking status: Never Smoker  . 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 Eyes: No visual changes. ENT: No sore throat. Cardiovascular: Denies chest pain. Respiratory: Denies shortness of breath. Gastrointestinal: Positive for abdominal pain and bloody diarrhea.  Genitourinary: Negative for dysuria. Musculoskeletal: Negative for back pain. Skin: Negative for rash. Neurological: Negative for headaches, focal weakness or  numbness.  ____________________________________________   PHYSICAL EXAM:  VITAL SIGNS: ED Triage Vitals  Enc Vitals Group     BP 06/22/16 1638 119/68     Pulse Rate 06/22/16 1638 (!) 133     Resp 06/22/16 1638 20     Temp 06/22/16 1638 (!) 100.7 F (38.2 C)     Temp Source 06/22/16 1638 Oral     SpO2 06/22/16 1638 100 %     Weight 06/22/16 1638 135 lb (61.2 kg)     Height 06/22/16 1638 5' 5"  (1.651 m)     Head Circumference --      Peak Flow --      Pain Score 06/22/16 1637 6   Constitutional: Alert and oriented. Well appearing and in no distress. Eyes: Conjunctivae are normal.  ENT   Head: Normocephalic and atraumatic.   Nose: No congestion/rhinnorhea.   Mouth/Throat: Mucous membranes are moist.   Neck: No stridor. Hematological/Lymphatic/Immunilogical: No cervical lymphadenopathy. Cardiovascular: Tachycardic, regular rhythm.  No murmurs, rubs, or gallops.  Respiratory: Normal respiratory effort without tachypnea nor retractions. Breath sounds are clear and equal bilaterally. No wheezes/rales/rhonchi. Gastrointestinal: Soft and non tender. No rebound. No guarding.  Genitourinary: Deferred Musculoskeletal: Normal range of motion in all extremities. No lower extremity edema. Neurologic:  Normal speech and language. No gross focal neurologic deficits are appreciated.  Skin:  No fluctuance or erythema over the area of the fistula.  Psychiatric: Mood and affect are normal. Speech and behavior are normal. Patient exhibits appropriate insight and judgment.  ____________________________________________    LABS (pertinent positives/negatives)  Labs Reviewed  COMPREHENSIVE METABOLIC PANEL - Abnormal; Notable for the following:       Result Value   Sodium 134 (*)    Potassium 2.9 (*)    Glucose, Bld 125 (*)    ALT 15 (*)    Total Bilirubin 1.4 (*)    All other components within normal limits  LACTIC ACID, PLASMA - Abnormal; Notable for the following:    Lactic  Acid, Venous 2.8 (*)    All other components within normal limits  CBC WITH DIFFERENTIAL/PLATELET - Abnormal; Notable for the following:    WBC 15.2 (*)    RDW 14.8 (*)    Neutro Abs 12.9 (*)    Lymphs Abs 0.6 (*)    Monocytes Absolute 1.6 (*)    All other components within normal limits  URINALYSIS, COMPLETE (UACMP) WITH MICROSCOPIC - Abnormal; Notable for the following:    Color, Urine STRAW (*)    APPearance CLEAR (*)    Ketones, ur 5 (*)    All other components within normal limits  CULTURE, BLOOD (ROUTINE X 2)  CULTURE, BLOOD (ROUTINE X 2)  LACTIC ACID, PLASMA  PROTIME-INR  URINALYSIS, ROUTINE W REFLEX MICROSCOPIC  TYPE AND SCREEN     ____________________________________________   EKG  I, Nance Pear, attending physician, personally viewed and interpreted this EKG  EKG Time: 1948 Rate: 99 Rhythm: sinus rhythm Axis: normal Intervals: qtc 453 QRS: narrow ST changes: no st elevation Impression: normal ekg   ____________________________________________    RADIOLOGY  CT abd/pel IMPRESSION:  1. The fistula between the right lateral aspect of the rectum and  the skin is again identified. The portion of the fistula immediately  beneath the skin is more prominent in caliber in the interval with  central decreased attenuation worrisome for a developing fluid  collection in the distal fistula immediately beneath the cutaneous  surface. A developing abscess cannot be excluded. Recommend clinical  correlation.  2. Fluid throughout the remaining colon consistent with history of  diarrhea. No colonic or small bowel inflammation identified. The  rectum is poorly evaluated due to lack of distention.  3. 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      ______________________________________   PROCEDURES  Procedures  ____________________________________________   INITIAL IMPRESSION / ASSESSMENT AND PLAN / ED  COURSE  Pertinent labs & imaging results that were available during my care of the patient were reviewed by me and considered in my medical decision making (see chart for details).  Patient was admitted to the emergency department today because of concerns for abdominal pain, GI bleeding fevers. Initial vital signs were concerning for possible sepsis given tachycardia and fever. Was called a code sepsis. Patient was given multiple broad spectrum antibiotics after lactic acid was elevated. Patient was tender in the abdomen. CT abdomen and pelvis was performed which shows a possible developing abscess and a known rectal cutaneous fistula. Dizziness with patient he states that he did have some bleeding from that area a few days before the symptoms started. However on exam no obvious abscess. Discussed with medicine team for admission. Also discussed briefly with surgery. This point felt given lack of true abscess patient would have no surgical need. Patient was given multiple broad-spectrum antibiotics.  ____________________________________________   FINAL CLINICAL IMPRESSION(S) / ED DIAGNOSES  Final diagnoses:  Generalized abdominal pain     Note: This dictation was prepared with Dragon dictation. Any transcriptional errors that result from this process are unintentional     Nance Pear, MD 06/22/16 2059

## 2016-06-22 NOTE — H&P (Signed)
History and Physical   [facility] - [place] @ [facility] Admission History and Physical [employer], D.O.    Patient Name: Leroy Hernandez MR#: [id number] Date of Birth: [birth year] Date of Admission: 06/22/2016  Referring MD/NP/PA: Dr. Archie Balboa  Primary Care Physician: [name], MD Patient coming from: Home Outpatient Specialists: [name]   Chief Complaint:  Chief Complaint  Patient presents with  . Diarrhea  . Back Pain    HPI: Leroy Hernandez is a 31 y.o. male with a known history of anxiety/depression, asthma, Crohn's with rectal fistula, cholelithiasis,  presents to the emergency department for evaluation of diarrhea and abdominal pain.  Patient was in a usual state of health until several days ago when he developed RLQ abdominal pain associated with multiple episodes of loose bloody stools per day.  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 He has been on prednisone for the past month or so from his last Crohn's flare..  Patient denies fevers/chills, weakness, dizziness, chest pain, shortness of breath, nausea vomiting, dysuria/frequency, changes in mental status.    Otherwise there has been no change in status. Patient has been taking medication as prescribed and there has been no recent change in medication or diet.  No recent antibiotics.  There has been no recent illness, hospitalizations, travel or sick contacts.    EMS/ED Course: Patient received morphine, Zofran, Zosyn, Vanco, NS, KCL.  Review of Systems:  CONSTITUTIONAL: No fever/chills, fatigue, weakness, weight gain/loss, headache. EYES: No blurry or double vision. ENT: No tinnitus, postnasal drip, redness or soreness of the oropharynx. RESPIRATORY: No cough, dyspnea, wheeze.  No hemoptysis.  CARDIOVASCULAR: No chest pain, palpitations, syncope, orthopnea. No lower extremity edema.  GASTROINTESTINAL:  Positive  abdominal pain, diarrhea, negative constipation.  No hematemesis, melena or hematochezia.No nausea, vomiting, GENITOURINARY: No dysuria, frequency, hematuria. ENDOCRINE: No polyuria or nocturia. No heat or cold intolerance. HEMATOLOGY: No anemia, bruising, bleeding. INTEGUMENTARY: No rashes, ulcers, lesions. MUSCULOSKELETAL: No arthritis, gout, dyspnea. NEUROLOGIC: No numbness, tingling, ataxia, seizure-type activity, weakness. PSYCHIATRIC: No anxiety, depression, insomnia.   Past Medical History:  Diagnosis Date  . Anxiety   . Asthma   . Crohn's disease ([place])   . Rectal fistula     Past Surgical History:  Procedure Laterality Date  . COLON RESECTION    . COLON SURGERY    . COLONOSCOPY WITH PROPOFOL N/A 04/03/2015   Procedure: COLONOSCOPY WITH PROPOFOL;  Surgeon: Hulen Luster, MD;  [facility]: [facility] ENDOSCOPY;  Service: Endoscopy;  Laterality: N/A;  . COLONOSCOPY WITH PROPOFOL N/A 12/22/2015   Procedure: COLONOSCOPY WITH PROPOFOL;  Surgeon: [name], MD;  Location: [facility];  Service: Endoscopy;  Laterality: N/A;  . rectal fistula surgery    . WRIST SURGERY       reports that he has never smoked. He has quit using smokeless tobacco. He reports that he drinks about 1.2 oz of alcohol per week . He reports that he does not use drugs.  No Known Allergies  Family History  Problem Relation Age of Onset  . Diabetes Paternal Grandfather   . Heart disease Paternal Grandfather     Prior to Admission medications   Medication Sig Start Date End Date Taking? Authorizing Provider  azaTHIOprine (IMURAN) 50 MG tablet Take three tablets by mouth daily 08/22/15  Yes [name], MD  busPIRone (BUSPAR) 5 MG tablet Take 1 tablet (5 mg total) by mouth 2 (  two) times daily. 05/27/16  Yes Hinda Kehr, MD  CIMZIA PREFILLED 2 X 200 MG/ML KIT Inject 1 Dose into the muscle every 14 (fourteen) days.  05/25/16  Yes [provider]  escitalopram (LEXAPRO) 10 MG tablet Take 10 mg by  mouth daily.  04/02/15  Yes [provider]  predniSONE (DELTASONE) 10 MG tablet Take 30m daily x 2 weeks, then 315mdaily x 2 weeks, then 2052maily x 2 weeks, then 40m80mily x 2 weeks. 04/29/16  Yes RobiMerlyn Lot  PROAIR HFA 108 ([phone]) MCG/ACT inhaler Inhale 2 puffs into the lungs every 4 (four) hours as needed. 02/27/16  Yes [provider]  traMADol (ULTRAM) 50 MG tablet Take 1 tablet (50 mg total) by mouth every 6 (six) hours as needed. Patient not taking: Reported on 06/22/2016 12/24/15   WebsLoney Hering    Physical Exam: Vitals:   06/22/16 1900 06/22/16 1939 06/22/16 2000 06/22/16 2030  BP: 126/79 129/79 117/78 120/82  Pulse: (!) 107 100 96 93  Resp: 16 18 19 14   Temp:      TempSrc:      SpO2: 100% 100% 98% 100%  Weight:      Height:        GENERAL: 30 y44.-year-old male patient, well-developed, well-nourished lying in the bed in no acute distress.  Pleasant and cooperative.   HEENT: Head atraumatic, normocephalic. Pupils equal, round, reactive to light and accommodation. No scleral icterus. Extraocular muscles intact. Nares are patent. Oropharynx is clear. Mucus membranes moist. NECK: Supple, full range of motion. No JVD, no bruit heard. No thyroid enlargement, no tenderness, no cervical lymphadenopathy. CHEST: Normal breath sounds bilaterally. No wheezing, rales, rhonchi or crackles. No use of accessory muscles of respiration.  No reproducible chest wall tenderness.  CARDIOVASCULAR: S1, S2 normal. No murmurs, rubs, or gallops. Cap refill <2 seconds. Pulses intact distally.  ABDOMEN: Soft, nondistended, nontender. No rebound, guarding, rigidity. Normoactive bowel sounds present in all four quadrants. No organomegaly or mass. EXTREMITIES: No pedal edema, cyanosis, or clubbing. No calf tenderness or Homan's sign.  NEUROLOGIC: The patient is alert and oriented x 3. Cranial nerves II through XII are grossly intact with no focal sensorimotor deficit.  Muscle strength 5/5 in all extremities. Sensation intact. Gait not checked. PSYCHIATRIC:  Normal affect, mood, thought content. SKIN: Warm, dry, and intact without obvious rash, lesion, or ulcer.    Labs on Admission:  CBC:  Recent Labs Lab 06/22/16 1648  WBC 15.2*  NEUTROABS 12.9*  HGB 14.9  HCT 44.1  MCV 84.5  PLT 234 916asic Metabolic Panel:  Recent Labs Lab 06/22/16 1648  NA 134*  K 2.9*  CL 101  CO2 23  GLUCOSE 125*  BUN 10  CREATININE 0.96  CALCIUM 9.2   GFR: Estimated Creatinine Clearance: 96.5 mL/min (by C-G formula based on SCr of 0.96 mg/dL). Liver Function Tests:  Recent Labs Lab 06/22/16 1648  AST 30  ALT 15*  ALKPHOS 78  BILITOT 1.4*  PROT 7.7  ALBUMIN 4.5   No results for input(s): LIPASE, AMYLASE in the last 168 hours. No results for input(s): AMMONIA in the last 168 hours. Coagulation Profile:  Recent Labs Lab 06/22/16 1648  INR 0.96   Cardiac Enzymes: No results for input(s): CKTOTAL, CKMB, CKMBINDEX, TROPONINI in the last 168 hours. BNP (last 3 results) No results for input(s): PROBNP in the last 8760 hours. HbA1C: No results for input(s): HGBA1C in the last 72 hours. CBG:  No results for input(s): GLUCAP in the last 168 hours. Lipid Profile: No results for input(s): CHOL, HDL, LDLCALC, TRIG, CHOLHDL, LDLDIRECT in the last 72 hours. Thyroid Function Tests: No results for input(s): TSH, T4TOTAL, FREET4, T3FREE, THYROIDAB in the last 72 hours. Anemia Panel: No results for input(s): VITAMINB12, FOLATE, FERRITIN, TIBC, IRON, RETICCTPCT in the last 72 hours. Urine analysis:    Component Value Date/Time   COLORURINE STRAW (A) 06/22/2016 1648   APPEARANCEUR CLEAR (A) 06/22/2016 1648   APPEARANCEUR Hazy 03/14/2013 1455   LABSPEC 1.008 06/22/2016 1648   LABSPEC 1.019 03/14/2013 1455   PHURINE 5.0 06/22/2016 1648   GLUCOSEU NEGATIVE 06/22/2016 1648   GLUCOSEU Negative 03/14/2013 1455   HGBUR NEGATIVE 06/22/2016 [place] 06/22/2016 1648   BILIRUBINUR Negative 03/14/2013 1455   KETONESUR 5 (A) 06/22/2016 1648   PROTEINUR NEGATIVE 06/22/2016 1648   NITRITE NEGATIVE 06/22/2016 1648   LEUKOCYTESUR NEGATIVE 06/22/2016 1648   LEUKOCYTESUR Negative 03/14/2013 1455   Sepsis Labs: @LABRCNTIP (procalcitonin:4,lacticidven:4) )No results found for this or any previous visit (from the past 240 hour(s)).   Radiological Exams on Admission: Ct Abdomen Pelvis W Contrast  Result Date: 06/22/2016 CLINICAL DATA:  Bloody diarrhea for 2 or 3 days. History of Crohn's disease. Fever. EXAM: CT ABDOMEN AND PELVIS WITH CONTRAST TECHNIQUE: Multidetector CT imaging of the abdomen and pelvis was performed using the standard protocol following bolus administration of intravenous contrast. CONTRAST:  110m ISOVUE-300 IOPAMIDOL (ISOVUE-300) INJECTION 61% COMPARISON:  Multiple CT scans since September 15, 2014 FINDINGS: Lower chest: No acute abnormality. Hepatobiliary: 2 small calcified stones are seen in the gallbladder with no wall thickening or pericholecystic fluid. The liver and portal vein are normal in appearance. Pancreas: Unremarkable. No pancreatic ductal dilatation or surrounding inflammatory changes. Spleen: Normal in size without focal abnormality. Adrenals/Urinary Tract: The adrenal glands are normal. Probable tiny cysts in the kidneys. The kidneys are otherwise normal. No ureterectasis or ureteral stones. The bladder is within normal limits. Stomach/Bowel: The stomach is normal. The small bowel is normal as well with no inflammation identified. The patient is status post partial colectomy. The rectum is decompressed limiting evaluation but there is no adjacent fat stranding. There is fluid in the remaining colon without wall thickening. Vascular/Lymphatic: No significant vascular findings are present. No enlarged abdominal or pelvic lymph nodes. Reproductive: Prostate is unremarkable. Other: Again noted is a tract  extending from the right lateral aspect of the rectum to the skin surface. The most distal aspect of this a tract appears slightly expanded on today's study compared to the previous with some slight central decreased attenuation. Musculoskeletal: Mild irregularity in the inferior SI joints is identified. No acute bony changes. IMPRESSION: 1. The fistula between the right lateral aspect of the rectum and the skin is again identified. The portion of the fistula immediately beneath the skin is more prominent in caliber in the interval with central decreased attenuation worrisome for a developing fluid collection in the distal fistula immediately beneath the cutaneous surface. A developing abscess cannot be excluded. Recommend clinical correlation. 2. Fluid throughout the remaining colon consistent with history of diarrhea. No colonic or small bowel inflammation identified. The rectum is poorly evaluated due to lack of distention. 3. 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 Electronically Signed   By: DDorise BullionIII M.D   On: 06/22/2016 19:56     Assessment/Plan  This is a 31y.o. male with a history of anxiety/depression, asthma, Crohn's with  rectal fistula, cholelithiasis, now being admitted with:  #. Sepsis - Admit to inpatient with telemetry monitoring - IV antibiotics: Cipro, Flagyl - IV fluid hydration - Follow up blood and stool cultures - Repeat CBC in am.   # Crohn's exacerbation - Cipro, Flagyl - Resume prednisone - Continue Imuran - GI consult requested. Consider surgical consultation regarding rectocutaneous fistula  #. Hypokalemia - Replace IV  #. History of depression - Continue Buspar, Lexapro  #. History of asthma - O2 and mednebs as needed  Admission status: Inpatient IV Fluids: NS Diet/Nutrition: NPO Consults called: GI  DVT Px: SCDs and early ambulation. Code Status: Full Code  Disposition Plan: To home in 1-2 days  All the  records are reviewed and case discussed with ED provider. Management plans discussed with the patient and/or family who express understanding and agree with plan of care.  Sundeep Destin D.O. on 06/22/2016 at 9:07 PM Between 7am to 6pm - Pager - [phone] After 6pm go to [email or web address] - [employer] [facility] Office [phone] CC: Primary care physician; [name], MD   06/22/2016, 9:07 PM

## 2016-06-22 NOTE — Progress Notes (Signed)
Pharmacy Antibiotic Note  JAVONTAY VANDAM is a 31 y.o. male admitted on 06/22/2016 with intra-abdominal infection.  Pharmacy has been consulted for Zosyn dosing.  Plan: Zosyn 4.5 grams q 8 hours ordered for Pseudomonas risk of patient on immunosuppressive therapy.  Height: 5' 5"  (165.1 cm) Weight: 141 lb 4.8 oz (64.1 kg) IBW/kg (Calculated) : 61.5  Temp (24hrs), Avg:99.9 F (37.7 C), Min:99 F (37.2 C), Max:100.7 F (38.2 C)   Recent Labs Lab 06/22/16 1648 06/22/16 1932  WBC 15.2*  --   CREATININE 0.96  --   LATICACIDVEN 2.8* 1.0    Estimated Creatinine Clearance: 97 mL/min (by C-G formula based on SCr of 0.96 mg/dL).    No Known Allergies  Antimicrobials this admission: Vancomycin x1, Zosyn 5/15 >>    >>   Dose adjustments this admission:   Microbiology results: 5/15 BCx: pending       5/15 UA: (-)  Thank you for allowing pharmacy to be a part of this patient's care.  Jemuel Laursen S 06/22/2016 11:55 PM
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
## 2016-06-22 NOTE — ED Notes (Signed)
Code  Sepsis  Called  To  carelink

## 2016-06-23 DIAGNOSIS — K604 Rectal fistula: Secondary | ICD-10-CM

## 2016-06-23 LAB — GASTROINTESTINAL PANEL BY PCR, STOOL (REPLACES STOOL CULTURE)

## 2016-06-23 LAB — COMPREHENSIVE METABOLIC PANEL
ALBUMIN: 3.8 g/dL (ref 3.5–5.0)
ALK PHOS: 68 U/L (ref 38–126)
ALT: 12 U/L — ABNORMAL LOW (ref 17–63)
AST: 26 U/L (ref 15–41)
Anion gap: 6 (ref 5–15)
BILIRUBIN TOTAL: 1.4 mg/dL — AB (ref 0.3–1.2)
BUN: 7 mg/dL (ref 6–20)
CALCIUM: 8.4 mg/dL — AB (ref 8.9–10.3)
CO2: 27 mmol/L (ref 22–32)
Chloride: 105 mmol/L (ref 101–111)
Creatinine, Ser: 0.95 mg/dL (ref 0.61–1.24)
GFR calc Af Amer: >60 mL/min (ref >60)
GLUCOSE: 98 mg/dL (ref 65–99)
Potassium: 3.8 mmol/L (ref 3.5–5.1)
Sodium: 138 mmol/L (ref 135–145)
TOTAL PROTEIN: 7 g/dL (ref 6.5–8.1)

## 2016-06-23 LAB — CBC
HEMATOCRIT: 40.2 % (ref 40.0–52.0)
HEMOGLOBIN: 13.8 g/dL (ref 13.0–18.0)
MCH: 29 pg (ref 26.0–34.0)
MCHC: 34.4 g/dL (ref 32.0–36.0)
MCV: 84.4 fL (ref 80.0–100.0)
Platelets: 213 10*3/uL (ref 150–440)
RBC: 4.77 MIL/uL (ref 4.40–5.90)
RDW: 14.7 % — ABNORMAL HIGH (ref 11.5–14.5)
WBC: 11.4 10*3/uL — AB (ref 3.8–10.6)

## 2016-06-23 LAB — MAGNESIUM: Magnesium: 1.8 mg/dL (ref 1.7–2.4)

## 2016-06-23 LAB — PROTIME-INR
INR: 1.07
Prothrombin Time: 13.9 seconds (ref 11.4–15.2)

## 2016-06-23 LAB — LACTIC ACID, PLASMA: Lactic Acid, Venous: 1.8 mmol/L (ref 0.5–1.9)

## 2016-06-23 LAB — PROCALCITONIN: PROCALCITONIN: 2.52 ng/mL

## 2016-06-23 LAB — APTT: aPTT: 35 seconds (ref 24–36)

## 2016-06-23 LAB — PHOSPHORUS: PHOSPHORUS: 3.7 mg/dL (ref 2.5–4.6)

## 2016-06-23 MED ORDER — PIPERACILLIN-TAZOBACTAM 3.375 G IVPB
3.3750 g | Freq: Three times a day (TID) | INTRAVENOUS | Status: DC
  Administered 2016-06-23 – 2016-06-25 (×6): 3.375 g via INTRAVENOUS
  Filled 2016-06-23 (×9): qty 50

## 2016-06-23 MED ORDER — ENOXAPARIN SODIUM 40 MG/0.4ML ~~LOC~~ SOLN
40.0000 mg | Freq: Every day | SUBCUTANEOUS | Status: DC
  Administered 2016-06-23 – 2016-06-25 (×3): 40 mg via SUBCUTANEOUS
  Filled 2016-06-23 (×3): qty 0.4

## 2016-06-23 NOTE — Progress Notes (Signed)
Pharmacy Antibiotic Note  Leroy Hernandez is a 31 y.o. male admitted on 06/22/2016 with intra-abdominal infection.  Pharmacy has been consulted for Zosyn dosing.  Plan: Patient being treated for intra-abdominal infection, will transition patient to Zosyn 3.375g EI IV Q8hr.    Height: 5' 5"  (165.1 cm) Weight: 141 lb 4.8 oz (64.1 kg) IBW/kg (Calculated) : 61.5  Temp (24hrs), Avg:99.7 F (37.6 C), Min:99 F (37.2 C), Max:100.7 F (38.2 C)   Recent Labs Lab 06/22/16 1648 06/22/16 1932 06/23/16 0029  WBC 15.2*  --  11.4*  CREATININE 0.96  --  0.95  LATICACIDVEN 2.8* 1.0 1.8    Estimated Creatinine Clearance: 98 mL/min (by C-G formula based on SCr of 0.95 mg/dL).    No Known Allergies  Antimicrobials this admission: Vancomycin 5/15 >> 5/15 Zosyn 5/15 >>   Dose adjustments this admission: 4/16 Zosyn transitioned to 3.375g  Microbiology results: 5/15 BCx: pending  Pharmacy will continue to monitor and adjust per consult.   Leroy Hernandez L 06/23/2016 8:55 AM

## 2016-06-23 NOTE — Consult Note (Signed)
Surgical Consultation  06/23/2016  Leroy Hernandez is an 31 y.o. male.   CC: Rt flank pain  HPI: This a patient admitted the hospital with known Crohn's disease and a known perianal fistula who presented to the hospital with signs of sepsis and right flank pain. He points to the right flank and not to his abdomen. He had no nausea vomiting but did have fevers  Concerning his perirectal fistula he has had this drained in the past and he noticed over the last week some slight increase in pain and some drainage that occurred 2 days ago it is better now than it had been.  Patient is known to me from previous admission years ago. He is currently on prednisone for his Crohn's disease.  Past Medical History:  Diagnosis Date  . Anxiety   . Asthma   . Crohn's disease ([place])   . Rectal fistula     Past Surgical History:  Procedure Laterality Date  . COLON RESECTION    . COLON SURGERY    . COLONOSCOPY WITH PROPOFOL N/A 04/03/2015   Procedure: COLONOSCOPY WITH PROPOFOL;  Surgeon: Hulen Luster, MD;  Location: [facility] ENDOSCOPY;  Service: Endoscopy;  Laterality: N/A;  . COLONOSCOPY WITH PROPOFOL N/A 12/22/2015   Procedure: COLONOSCOPY WITH PROPOFOL;  Surgeon: [name], MD;  Location: [facility];  Service: Endoscopy;  Laterality: N/A;  . rectal fistula surgery    . WRIST SURGERY      Family History  Problem Relation Age of Onset  . Diabetes Paternal Grandfather   . Heart disease Paternal Grandfather     Social History:  reports that he has never smoked. He has quit using smokeless tobacco. He reports that he drinks about 1.2 oz of alcohol per week . He reports that he does not use drugs.  Allergies: No Known Allergies  Medications reviewed.   Review of Systems:   Review of Systems  Constitutional: Positive for fever. Negative for chills and weight loss.  HENT: Negative.   Eyes: Negative.   Respiratory: Negative.   Cardiovascular: Negative.   Gastrointestinal: Negative for  abdominal pain, blood in stool, constipation, diarrhea, heartburn, melena, nausea and vomiting.  Genitourinary: Negative.   Musculoskeletal: Negative.   Skin: Negative.   Neurological: Negative.   Endo/Heme/Allergies: Negative.   Psychiatric/Behavioral: Negative.      Physical Exam:  BP (!) 100/54 (BP Location: Left Arm)   Pulse (!) 109   Temp 99.3 F (37.4 C) (Oral)   Resp 16   Ht _0  (1.651 m)   Wt 141 lb 4.8 oz (64.1 kg)   SpO2 97%   BMI 23.51 kg/m   Physical Exam  Constitutional: He is oriented to person, place, and time and well-developed, well-nourished, and in no distress. No distress.  HENT:  Head: Normocephalic and atraumatic.  Eyes: Pupils are equal, round, and reactive to light. Right eye exhibits no discharge. Left eye exhibits no discharge. No scleral icterus.  Neck: Normal range of motion.  Cardiovascular: Normal rate, regular rhythm and normal heart sounds.   Pulmonary/Chest: Effort normal and breath sounds normal. No respiratory distress. He has no wheezes.  Abdominal: Soft. He exhibits no distension. There is no tenderness. There is no rebound and no guarding.  No right flank tenderness no CVA tenderness  Genitourinary:  Genitourinary Comments: Minimal induration without erythema and no tenderness of the right buttock near fistula. No purulent drainage noted  Musculoskeletal: Normal range of motion. He exhibits no edema.  Lymphadenopathy:  He has no cervical adenopathy.  Neurological: He is alert and oriented to person, place, and time.  Skin: Skin is warm and dry. No rash noted. He is not diaphoretic. No erythema.  Psychiatric: Mood and affect normal.  Vitals reviewed.     Results for orders placed or performed during the hospital encounter of 06/22/16 (from the past 48 hour(s))  Comprehensive metabolic panel     Status: Abnormal   Collection Time: 06/22/16  4:48 PM  Result Value Ref Range   Sodium 134 (L) 135 - 145 mmol/L   Potassium 2.9 (L)  3.5 - 5.1 mmol/L   Chloride 101 101 - 111 mmol/L   CO2 23 22 - 32 mmol/L   Glucose, Bld 125 (H) 65 - 99 mg/dL   BUN 10 6 - 20 mg/dL   Creatinine, Ser 0.96 0.61 - 1.24 mg/dL   Calcium 9.2 8.9 - 10.3 mg/dL   Total Protein 7.7 6.5 - 8.1 g/dL   Albumin 4.5 3.5 - 5.0 g/dL   AST 30 15 - 41 U/L   ALT 15 (L) 17 - 63 U/L   Alkaline Phosphatase 78 38 - 126 U/L   Total Bilirubin 1.4 (H) 0.3 - 1.2 mg/dL   GFR calc non Af Amer >60 >60 mL/min   GFR calc Af Amer >60 >60 mL/min    Comment: (NOTE) The eGFR has been calculated using the CKD EPI equation. This calculation has not been validated in all clinical situations. eGFR's persistently <60 mL/min signify possible Chronic Kidney Disease.    Anion gap 10 5 - 15  Lactic acid, plasma     Status: Abnormal   Collection Time: 06/22/16  4:48 PM  Result Value Ref Range   Lactic Acid, Venous 2.8 (HH) 0.5 - 1.9 mmol/L    Comment: CRITICAL RESULT CALLED TO, READ BACK BY AND VERIFIED WITH CASEY ROBERTS 06/22/16 1739 KLW   CBC with Differential     Status: Abnormal   Collection Time: 06/22/16  4:48 PM  Result Value Ref Range   WBC 15.2 (H) 3.8 - 10.6 K/uL   RBC 5.22 4.40 - 5.90 MIL/uL   Hemoglobin 14.9 13.0 - 18.0 g/dL   HCT 44.1 40.0 - 52.0 %   MCV 84.5 80.0 - 100.0 fL   MCH 28.6 26.0 - 34.0 pg   MCHC 33.8 32.0 - 36.0 g/dL   RDW 14.8 (H) 11.5 - 14.5 %   Platelets 234 150 - 440 K/uL   Neutrophils Relative % 85 %   Neutro Abs 12.9 (H) 1.4 - 6.5 K/uL   Lymphocytes Relative 4 %   Lymphs Abs 0.6 (L) 1.0 - 3.6 K/uL   Monocytes Relative 11 %   Monocytes Absolute 1.6 (H) 0.2 - 1.0 K/uL   Eosinophils Relative 0 %   Eosinophils Absolute 0.0 0 - 0.7 K/uL   Basophils Relative 0 %   Basophils Absolute 0.0 0 - 0.1 K/uL  Protime-INR     Status: None   Collection Time: 06/22/16  4:48 PM  Result Value Ref Range   Prothrombin Time 12.8 11.4 - 15.2 seconds   INR 0.96   Culture, blood (Routine x 2)     Status: None (Preliminary result)   Collection Time:  06/22/16  4:48 PM  Result Value Ref Range   Specimen Description BLOOD R FA    Special Requests Blood Culture adequate volume    Culture NO GROWTH < 24 HOURS    Report Status PENDING   Urinalysis, Complete w  Microscopic     Status: Abnormal   Collection Time: 06/22/16  4:48 PM  Result Value Ref Range   Color, Urine STRAW (A) YELLOW   APPearance CLEAR (A) CLEAR   Specific Gravity, Urine 1.008 1.005 - 1.030   pH 5.0 5.0 - 8.0   Glucose, UA NEGATIVE NEGATIVE mg/dL   Hgb urine dipstick NEGATIVE NEGATIVE   Bilirubin Urine NEGATIVE NEGATIVE   Ketones, ur 5 (A) NEGATIVE mg/dL   Protein, ur NEGATIVE NEGATIVE mg/dL   Nitrite NEGATIVE NEGATIVE   Leukocytes, UA NEGATIVE NEGATIVE   RBC / HPF NONE SEEN 0 - 5 RBC/hpf   WBC, UA NONE SEEN 0 - 5 WBC/hpf   Bacteria, UA NONE SEEN NONE SEEN   Squamous Epithelial / LPF NONE SEEN NONE SEEN  Type and screen [facility]     Status: None   Collection Time: 06/22/16  4:48 PM  Result Value Ref Range   ABO/RH(D) O NEG    Antibody Screen NEG    Sample Expiration 06/25/2016   Culture, blood (Routine x 2)     Status: None (Preliminary result)   Collection Time: 06/22/16  5:16 PM  Result Value Ref Range   Specimen Description BLOOD Blood Culture adequate volume    Special Requests      BOTTLES DRAWN AEROBIC AND ANAEROBIC LEFT ANTECUBITAL   Culture NO GROWTH < 24 HOURS    Report Status PENDING   Lactic acid, plasma     Status: None   Collection Time: 06/22/16  7:32 PM  Result Value Ref Range   Lactic Acid, Venous 1.0 0.5 - 1.9 mmol/L  APTT     Status: None   Collection Time: 06/23/16 12:29 AM  Result Value Ref Range   aPTT 35 24 - 36 seconds  Magnesium     Status: None   Collection Time: 06/23/16 12:29 AM  Result Value Ref Range   Magnesium 1.8 1.7 - 2.4 mg/dL  Phosphorus     Status: None   Collection Time: 06/23/16 12:29 AM  Result Value Ref Range   Phosphorus 3.7 2.5 - 4.6 mg/dL  CBC     Status: Abnormal   Collection  Time: 06/23/16 12:29 AM  Result Value Ref Range   WBC 11.4 (H) 3.8 - 10.6 K/uL   RBC 4.77 4.40 - 5.90 MIL/uL   Hemoglobin 13.8 13.0 - 18.0 g/dL   HCT 40.2 40.0 - 52.0 %   MCV 84.4 80.0 - 100.0 fL   MCH 29.0 26.0 - 34.0 pg   MCHC 34.4 32.0 - 36.0 g/dL   RDW 14.7 (H) 11.5 - 14.5 %   Platelets 213 150 - 440 K/uL  Lactic acid, plasma     Status: None   Collection Time: 06/23/16 12:29 AM  Result Value Ref Range   Lactic Acid, Venous 1.8 0.5 - 1.9 mmol/L  Procalcitonin     Status: None   Collection Time: 06/23/16 12:29 AM  Result Value Ref Range   Procalcitonin 2.52 ng/mL    Comment:        Interpretation: PCT > 2 ng/mL: Systemic infection (sepsis) is likely, unless other causes are known. (NOTE)         ICU PCT Algorithm               Non ICU PCT Algorithm    ----------------------------     ------------------------------         PCT < 0.25 ng/mL  PCT < 0.1 ng/mL     Stopping of antibiotics            Stopping of antibiotics       strongly encouraged.               strongly encouraged.    ----------------------------     ------------------------------       PCT level decrease by               PCT < 0.25 ng/mL       >= 80% from peak PCT       OR PCT 0.25 - 0.5 ng/mL          Stopping of antibiotics                                             encouraged.     Stopping of antibiotics           encouraged.    ----------------------------     ------------------------------       PCT level decrease by              PCT >= 0.25 ng/mL       < 80% from peak PCT        AND PCT >= 0.5 ng/mL            Continuing antibiotics                                               encouraged.       Continuing antibiotics            encouraged.    ----------------------------     ------------------------------     PCT level increase compared          PCT > 0.5 ng/mL         with peak PCT AND          PCT >= 0.5 ng/mL             Escalation of antibiotics                                           strongly encouraged.      Escalation of antibiotics        strongly encouraged.   Protime-INR     Status: None   Collection Time: 06/23/16 12:29 AM  Result Value Ref Range   Prothrombin Time 13.9 11.4 - 15.2 seconds   INR 1.07   Comprehensive metabolic panel     Status: Abnormal   Collection Time: 06/23/16 12:29 AM  Result Value Ref Range   Sodium 138 135 - 145 mmol/L   Potassium 3.8 3.5 - 5.1 mmol/L   Chloride 105 101 - 111 mmol/L   CO2 27 22 - 32 mmol/L   Glucose, Bld 98 65 - 99 mg/dL   BUN 7 6 - 20 mg/dL   Creatinine, Ser 0.95 0.61 - 1.24 mg/dL   Calcium 8.4 (L) 8.9 - 10.3 mg/dL   Total Protein 7.0 6.5 - 8.1 g/dL   Albumin 3.8 3.5 - 5.0 g/dL   AST 26 15 -  41 U/L   ALT 12 (L) 17 - 63 U/L   Alkaline Phosphatase 68 38 - 126 U/L   Total Bilirubin 1.4 (H) 0.3 - 1.2 mg/dL   GFR calc non Af Amer >60 >60 mL/min   GFR calc Af Amer >60 >60 mL/min    Comment: (NOTE) The eGFR has been calculated using the CKD EPI equation. This calculation has not been validated in all clinical situations. eGFR's persistently <60 mL/min signify possible Chronic Kidney Disease.    Anion gap 6 5 - 15   Ct Abdomen Pelvis W Contrast  Result Date: 06/22/2016 CLINICAL DATA:  Bloody diarrhea for 2 or 3 days. History of Crohn's disease. Fever. EXAM: CT ABDOMEN AND PELVIS WITH CONTRAST TECHNIQUE: Multidetector CT imaging of the abdomen and pelvis was performed using the standard protocol following bolus administration of intravenous contrast. CONTRAST:  172m ISOVUE-300 IOPAMIDOL (ISOVUE-300) INJECTION 61% COMPARISON:  Multiple CT scans since September 15, 2014 FINDINGS: Lower chest: No acute abnormality. Hepatobiliary: 2 small calcified stones are seen in the gallbladder with no wall thickening or pericholecystic fluid. The liver and portal vein are normal in appearance. Pancreas: Unremarkable. No pancreatic ductal dilatation or surrounding inflammatory changes. Spleen: Normal in size without focal  abnormality. Adrenals/Urinary Tract: The adrenal glands are normal. Probable tiny cysts in the kidneys. The kidneys are otherwise normal. No ureterectasis or ureteral stones. The bladder is within normal limits. Stomach/Bowel: The stomach is normal. The small bowel is normal as well with no inflammation identified. The patient is status post partial colectomy. The rectum is decompressed limiting evaluation but there is no adjacent fat stranding. There is fluid in the remaining colon without wall thickening. Vascular/Lymphatic: No significant vascular findings are present. No enlarged abdominal or pelvic lymph nodes. Reproductive: Prostate is unremarkable. Other: Again noted is a tract extending from the right lateral aspect of the rectum to the skin surface. The most distal aspect of this a tract appears slightly expanded on today's study compared to the previous with some slight central decreased attenuation. Musculoskeletal: Mild irregularity in the inferior SI joints is identified. No acute bony changes. IMPRESSION: 1. The fistula between the right lateral aspect of the rectum and the skin is again identified. The portion of the fistula immediately beneath the skin is more prominent in caliber in the interval with central decreased attenuation worrisome for a developing fluid collection in the distal fistula immediately beneath the cutaneous surface. A developing abscess cannot be excluded. Recommend clinical correlation. 2. Fluid throughout the remaining colon consistent with history of diarrhea. No colonic or small bowel inflammation identified. The rectum is poorly evaluated due to lack of distention. 3. 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 Electronically Signed   By: DDorise BullionIII M.D   On: 06/22/2016 19:56   Dg Chest Port 1 View  Result Date: 06/23/2016 CLINICAL DATA:  Sepsis EXAM: PORTABLE CHEST 1 VIEW COMPARISON:  03/21/2015 FINDINGS: The heart size and  mediastinal contours are within normal limits. Both lungs are clear. The visualized skeletal structures are unremarkable. IMPRESSION: No active disease. Electronically Signed   By: KDonavan FoilM.D.   On: 06/23/2016 00:10    Assessment/Plan:  This patient with known Crohn's disease on prednisone who presents with right flank pain and perianal drainage several days ago. He states his perianal pain is much improved and almost gone and he's had no further drainage. He's had this happened multiple times and has a known fistula. Concerning his right flank  pain it is not his abdomen he points to his kidney area but is completely nontender at this point. A CT scan is reviewed as are her labs. There is no obvious sign of infection that would require drainage in the perianal area. Patient is currently on antibiotics and will be followed but at this point there are no surgical needs in this patient.  Florene Glen, MD, FACS

## 2016-06-23 NOTE — Progress Notes (Signed)
[name] NAME: Leroy Hernandez    MR#:  [id number]  DATE OF BIRTH:  1985/02/16  SUBJECTIVE:   Patient here with rectal bleeding and diarrhea has had abscess in past needing draining  No BM since admission  REVIEW OF SYSTEMS:    Review of Systems  Constitutional: Negative for fever, chills weight loss HENT: Negative for ear pain, nosebleeds, congestion, facial swelling, rhinorrhea, neck pain, neck stiffness and ear discharge.   Respiratory: Negative for cough, shortness of breath, wheezing  Cardiovascular: Negative for chest pain, palpitations and leg swelling.  Gastrointestinal: Negative for heartburn, abdominal pain, vomiting, ++diarrhea NO consitpation Genitourinary: Negative for dysuria, urgency, frequency, hematuria Musculoskeletal: Negative for back pain or joint pain Neurological: Negative for dizziness, seizures, syncope, focal weakness,  numbness and headaches.  Hematological: Does not bruise/bleed easily.  Psychiatric/Behavioral: Negative for hallucinations, confusion, dysphoric mood    Tolerating Diet:clear liquid       DRUG ALLERGIES:  No Known Allergies  VITALS:  Blood pressure (!) 100/54, pulse (!) 109, temperature 99.3 F (37.4 C), temperature source Oral, resp. rate 16, height 5' 5"  (1.651 m), weight 64.1 kg (141 lb 4.8 oz), SpO2 97 %.  PHYSICAL EXAMINATION:  Constitutional: Appears well-developed and well-nourished. No distress. HENT: Normocephalic. Marland Kitchen Oropharynx is clear and moist.  Eyes: Conjunctivae and EOM are normal. PERRLA, no scleral icterus.  Neck: Normal ROM. Neck supple. No JVD. No tracheal deviation. CVS: RRR, S1/S2 +, no murmurs, no gallops, no carotid bruit.  Pulmonary: Effort and breath sounds normal, no stridor, rhonchi, wheezes, rales.  Abdominal: Soft. BS +,  no distension, tenderness, rebound or guarding.  Musculoskeletal: Normal range of motion. No edema and no tenderness.  Neuro:  Alert. CN 2-12 grossly intact. No focal deficits. Skin: Skin is warm and dry. No rash noted. Psychiatric: Normal mood and affect.      LABORATORY PANEL:   CBC  Recent Labs Lab 06/23/16 0029  WBC 11.4*  HGB 13.8  HCT 40.2  PLT 213   ------------------------------------------------------------------------------------------------------------------  Chemistries   Recent Labs Lab 06/23/16 0029  NA 138  K 3.8  CL 105  CO2 27  GLUCOSE 98  BUN 7  CREATININE 0.95  CALCIUM 8.4*  MG 1.8  AST 26  ALT 12*  ALKPHOS 68  BILITOT 1.4*   ------------------------------------------------------------------------------------------------------------------  Cardiac Enzymes No results for input(s): TROPONINI in the last 168 hours. ------------------------------------------------------------------------------------------------------------------  RADIOLOGY:  Ct Abdomen Pelvis W Contrast  Result Date: 06/22/2016 CLINICAL DATA:  Bloody diarrhea for 2 or 3 days. History of Crohn's disease. Fever. EXAM: CT ABDOMEN AND PELVIS WITH CONTRAST TECHNIQUE: Multidetector CT imaging of the abdomen and pelvis was performed using the standard protocol following bolus administration of intravenous contrast. CONTRAST:  17m ISOVUE-300 IOPAMIDOL (ISOVUE-300) INJECTION 61% COMPARISON:  Multiple CT scans since September 15, 2014 FINDINGS: Lower chest: No acute abnormality. Hepatobiliary: 2 small calcified stones are seen in the gallbladder with no wall thickening or pericholecystic fluid. The liver and portal vein are normal in appearance. Pancreas: Unremarkable. No pancreatic ductal dilatation or surrounding inflammatory changes. Spleen: Normal in size without focal abnormality. Adrenals/Urinary Tract: The adrenal glands are normal. Probable tiny cysts in the kidneys. The kidneys are otherwise normal. No ureterectasis or ureteral stones. The bladder is within normal limits. Stomach/Bowel: The stomach is normal. The  small bowel is normal as well with no inflammation identified. The patient is status post partial colectomy. The rectum is decompressed limiting evaluation but there  is no adjacent fat stranding. There is fluid in the remaining colon without wall thickening. Vascular/Lymphatic: No significant vascular findings are present. No enlarged abdominal or pelvic lymph nodes. Reproductive: Prostate is unremarkable. Other: Again noted is a tract extending from the right lateral aspect of the rectum to the skin surface. The most distal aspect of this a tract appears slightly expanded on today's study compared to the previous with some slight central decreased attenuation. Musculoskeletal: Mild irregularity in the inferior SI joints is identified. No acute bony changes. IMPRESSION: 1. The fistula between the right lateral aspect of the rectum and the skin is again identified. The portion of the fistula immediately beneath the skin is more prominent in caliber in the interval with central decreased attenuation worrisome for a developing fluid collection in the distal fistula immediately beneath the cutaneous surface. A developing abscess cannot be excluded. Recommend clinical correlation. 2. Fluid throughout the remaining colon consistent with history of diarrhea. No colonic or small bowel inflammation identified. The rectum is poorly evaluated due to lack of distention. 3. 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 Electronically Signed   By: Dorise Bullion III M.D   On: 06/22/2016 19:56   Dg Chest Port 1 View  Result Date: 06/23/2016 CLINICAL DATA:  Sepsis EXAM: PORTABLE CHEST 1 VIEW COMPARISON:  03/21/2015 FINDINGS: The heart size and mediastinal contours are within normal limits. Both lungs are clear. The visualized skeletal structures are unremarkable. IMPRESSION: No active disease. Electronically Signed   By: Donavan Foil M.D.   On: 06/23/2016 00:10     ASSESSMENT AND PLAN:    31 year old male with a history of Crohn's disease and rectal fistula was admitted for sepsis.   1. Sepsis: Patient admitted with fever, leukocytosis, tachycardia CT showing possible abscess Continue Zosyn Follow up on blood cultures Lactic acid has improved White blood cell improving   2. Crohn's exacerbation: I spoke on the phone with GI there is no GI coverage today. Continue oral prednisone and Imuran  3. Rectocutaneous fistula with abscess: Continue Zosyn Surgical evaluation   4. Hypokalemia: This has been repleted  Management plans discussed with the patient and he is in agreement.  CODE STATUS: full  TOTAL TIME TAKING CARE OF THIS PATIENT: 30 minutes.     POSSIBLE D/C 1-3 days, DEPENDING ON CLINICAL CONDITION.   Rudolf Blizard M.D on 06/23/2016 at 9:15 AM  Between 7am to 6pm - Pager - [phone] After 6pm go to [email or web address] - password [email or web address] [facility]  Office  [phone]  CC: Primary care physician; [name], MD  Note: This dictation was prepared with Dragon dictation along with smaller phrase technology. Any transcriptional errors that result from this process are unintentional.

## 2016-06-24 DIAGNOSIS — R1084 Generalized abdominal pain: Secondary | ICD-10-CM

## 2016-06-24 DIAGNOSIS — K50919 Crohn's disease, unspecified, with unspecified complications: Secondary | ICD-10-CM

## 2016-06-24 LAB — BASIC METABOLIC PANEL
Anion gap: 6 (ref 5–15)
BUN: 7 mg/dL (ref 6–20)
CALCIUM: 8.2 mg/dL — AB (ref 8.9–10.3)
CO2: 25 mmol/L (ref 22–32)
Chloride: 106 mmol/L (ref 101–111)
Creatinine, Ser: 1.04 mg/dL (ref 0.61–1.24)
GFR calc Af Amer: >60 mL/min (ref >60)
GFR calc non Af Amer: >60 mL/min (ref >60)
GLUCOSE: 88 mg/dL (ref 65–99)
Potassium: 3.5 mmol/L (ref 3.5–5.1)
Sodium: 137 mmol/L (ref 135–145)

## 2016-06-24 LAB — CBC
HEMATOCRIT: 38.3 % — AB (ref 40.0–52.0)
HEMOGLOBIN: 13.1 g/dL (ref 13.0–18.0)
MCH: 28.9 pg (ref 26.0–34.0)
MCHC: 34.1 g/dL (ref 32.0–36.0)
MCV: 84.6 fL (ref 80.0–100.0)
Platelets: 187 10*3/uL (ref 150–440)
RBC: 4.53 MIL/uL (ref 4.40–5.90)
RDW: 14.6 % — ABNORMAL HIGH (ref 11.5–14.5)
WBC: 5.7 10*3/uL (ref 3.8–10.6)

## 2016-06-24 LAB — HIV ANTIBODY (ROUTINE TESTING W REFLEX): HIV Screen 4th Generation wRfx: NONREACTIVE

## 2016-06-24 NOTE — Progress Notes (Addendum)
[facility] NAME: Leroy Hernandez    MR#:  [id number]  DATE OF BIRTH:  [birth year]  SUBJECTIVE:   Patient reports had bloody BM this am thinks it was hemorhoids   REVIEW OF SYSTEMS:    Review of Systems  Constitutional: ++ for fever, NO chills  No intentional weight loss HENT: Negative for ear pain, nosebleeds, congestion, facial swelling, rhinorrhea, neck pain, neck stiffness and ear discharge.   Respiratory: Negative for cough, shortness of breath, wheezing  Cardiovascular: Negative for chest pain, palpitations and leg swelling.  Gastrointestinal: Negative for heartburn, abdominal pain, vomiting, no diarrhea NO consitpation   +bloody BM Genitourinary: Negative for dysuria, urgency, frequency, hematuria Musculoskeletal: Negative for back pain or joint pain Neurological: Negative for dizziness, seizures, syncope, focal weakness,  numbness and headaches.  Hematological: Does not bruise/bleed easily.  Psychiatric/Behavioral: Negative for hallucinations, confusion, dysphoric mood    Tolerating Diet:clear liquid       DRUG ALLERGIES:  No Known Allergies  VITALS:  Blood pressure (!) 108/55, pulse 90, temperature 98.2 F (36.8 C), temperature source Oral, resp. rate 16, height 5' 5"  (1.651 m), weight 64.1 kg (141 lb 4.8 oz), SpO2 99 %.  PHYSICAL EXAMINATION:  Constitutional: Appears thin . No distress. HENT: Normocephalic. Marland Kitchen Oropharynx is clear and moist.  Eyes: Conjunctivae and EOM are normal. PERRLA, no scleral icterus.  Neck: Normal ROM. Neck supple. No JVD. No tracheal deviation. CVS: RRR, S1/S2 +, no murmurs, no gallops, no carotid bruit.  Pulmonary: Effort and breath sounds normal, no stridor, rhonchi, wheezes, rales.  Abdominal: Soft. BS +,  no distension, tenderness, rebound or guarding.  Musculoskeletal: Normal range of motion. No edema and no tenderness.  Neuro: Alert. CN 2-12 grossly intact. No focal  deficits. Skin: Skin is warm and dry. No rash noted. Psychiatric: Normal mood and affect.      LABORATORY PANEL:   CBC  Recent Labs Lab 06/24/16 0545  WBC 5.7  HGB 13.1  HCT 38.3*  PLT 187   ------------------------------------------------------------------------------------------------------------------  Chemistries   Recent Labs Lab 06/23/16 0029 06/24/16 0545  NA 138 137  K 3.8 3.5  CL 105 106  CO2 27 25  GLUCOSE 98 88  BUN 7 7  CREATININE 0.95 1.04  CALCIUM 8.4* 8.2*  MG 1.8  --   AST 26  --   ALT 12*  --   ALKPHOS 68  --   BILITOT 1.4*  --    ------------------------------------------------------------------------------------------------------------------  Cardiac Enzymes No results for input(s): TROPONINI in the last 168 hours. ------------------------------------------------------------------------------------------------------------------  RADIOLOGY:  Ct Abdomen Pelvis W Contrast  Result Date: 06/22/2016 CLINICAL DATA:  Bloody diarrhea for 2 or 3 days. History of Crohn's disease. Fever. EXAM: CT ABDOMEN AND PELVIS WITH CONTRAST TECHNIQUE: Multidetector CT imaging of the abdomen and pelvis was performed using the standard protocol following bolus administration of intravenous contrast. CONTRAST:  148m ISOVUE-300 IOPAMIDOL (ISOVUE-300) INJECTION 61% COMPARISON:  Multiple CT scans since September 15, 2014 FINDINGS: Lower chest: No acute abnormality. Hepatobiliary: 2 small calcified stones are seen in the gallbladder with no wall thickening or pericholecystic fluid. The liver and portal vein are normal in appearance. Pancreas: Unremarkable. No pancreatic ductal dilatation or surrounding inflammatory changes. Spleen: Normal in size without focal abnormality. Adrenals/Urinary Tract: The adrenal glands are normal. Probable tiny cysts in the kidneys. The kidneys are otherwise normal. No ureterectasis or ureteral stones. The bladder is within normal limits.  Stomach/Bowel: The stomach is normal. The  small bowel is normal as well with no inflammation identified. The patient is status post partial colectomy. The rectum is decompressed limiting evaluation but there is no adjacent fat stranding. There is fluid in the remaining colon without wall thickening. Vascular/Lymphatic: No significant vascular findings are present. No enlarged abdominal or pelvic lymph nodes. Reproductive: Prostate is unremarkable. Other: Again noted is a tract extending from the right lateral aspect of the rectum to the skin surface. The most distal aspect of this a tract appears slightly expanded on today's study compared to the previous with some slight central decreased attenuation. Musculoskeletal: Mild irregularity in the inferior SI joints is identified. No acute bony changes. IMPRESSION: 1. The fistula between the right lateral aspect of the rectum and the skin is again identified. The portion of the fistula immediately beneath the skin is more prominent in caliber in the interval with central decreased attenuation worrisome for a developing fluid collection in the distal fistula immediately beneath the cutaneous surface. A developing abscess cannot be excluded. Recommend clinical correlation. 2. Fluid throughout the remaining colon consistent with history of diarrhea. No colonic or small bowel inflammation identified. The rectum is poorly evaluated due to lack of distention. 3. 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 Electronically Signed   By: Dorise Bullion III M.D   On: 06/22/2016 19:56   Dg Chest Port 1 View  Result Date: 06/23/2016 CLINICAL DATA:  Sepsis EXAM: PORTABLE CHEST 1 VIEW COMPARISON:  03/21/2015 FINDINGS: The heart size and mediastinal contours are within normal limits. Both lungs are clear. The visualized skeletal structures are unremarkable. IMPRESSION: No active disease. Electronically Signed   By: Donavan Foil M.D.   On: 06/23/2016  00:10     ASSESSMENT AND PLAN:   31 year old male with a history of Crohn's disease and rectal fistula was admitted for sepsis.   1. Sepsis: Patient admitted with fever, leukocytosis, tachycardia Still with fever overnight Continue Zosyn Blood cx are negative and Gi panel negative Lactic acid has improved White blood cell improving   2. Crohn's exacerbation:Continue oral prednisone and Imuran GI consult today Hemoglobin stable Advance diet as per GI recommendations.   3. Rectocutaneous fistula with abscess: Continue Zosyn Surgical evaluation appreciated. As per surgery, perianal pain is improved and no further drainage is needed, however with fevers may need another CT  4. Hypokalemia: This has been repleted  Management plans discussed with the patient and he is in agreement.  CODE STATUS: full  TOTAL TIME TAKING CARE OF THIS PATIENT: 24 minutes.     POSSIBLE D/C 1-2 days DEPENDING ON CLINICAL CONDITION.   Krystelle Prashad M.D on 06/24/2016 at 7:48 AM  Between 7am to 6pm - Pager - [phone] After 6pm go to [email or web address] - password [email or web address] [facility]  Office  [phone]  CC: Primary care physician; [name], MD  Note: This dictation was prepared with Dragon dictation along with smaller phrase technology. Any transcriptional errors that result from this process are unintentional.

## 2016-06-24 NOTE — Progress Notes (Signed)
CC: Right flank pain Subjective: Patient feels well this morning but he still has minimal right flank pain. Questioning about his anal area he states he is not having much if any pain in that area and no further drainage. He did have a fever last night but no chills no nausea or vomiting  Objective: Vital signs in last 24 hours: Temp:  [98.2 F (36.8 C)-102 F (38.9 C)] 98.2 F (36.8 C) (05/17 0459) Pulse Rate:  [90-113] 90 (05/17 0459) Resp:  [16] 16 (05/17 0459) BP: (102-108)/(55-59) 108/55 (05/17 0459) SpO2:  [94 %-99 %] 99 % (05/17 0459) Last BM Date: 06/23/16  Intake/Output from previous day: 05/16 0701 - 05/17 0700 In: 3203 [P.O.:1120; I.V.:1983; IV Piggyback:100] Out: 7026 [Urine:1475] Intake/Output this shift: No intake/output data recorded.  Physical exam:  Awake alert and oriented vital signs are stable. He was febrile last night to 102.  Abdomen is soft and minimally tender in the left lower quadrant minimally tender in the right upper quadrant no peritoneal signs no Murphy sign.  Calves are nontender anal area demonstrates minimal induration minimal purulence no erythema and nontender  Lab Results: CBC   Recent Labs  06/23/16 0029 06/24/16 0545  WBC 11.4* 5.7  HGB 13.8 13.1  HCT 40.2 38.3*  PLT 213 187   BMET  Recent Labs  06/23/16 0029 06/24/16 0545  NA 138 137  K 3.8 3.5  CL 105 106  CO2 27 25  GLUCOSE 98 88  BUN 7 7  CREATININE 0.95 1.04  CALCIUM 8.4* 8.2*   PT/INR  Recent Labs  06/22/16 1648 06/23/16 0029  LABPROT 12.8 13.9  INR 0.96 1.07   ABG No results for input(s): PHART, HCO3 in the last 72 hours.  Invalid input(s): PCO2, PO2  Studies/Results: Ct Abdomen Pelvis W Contrast  Result Date: 06/22/2016 CLINICAL DATA:  Bloody diarrhea for 2 or 3 days. History of Crohn's disease. Fever. EXAM: CT ABDOMEN AND PELVIS WITH CONTRAST TECHNIQUE: Multidetector CT imaging of the abdomen and pelvis was performed using the standard protocol  following bolus administration of intravenous contrast. CONTRAST:  176m ISOVUE-300 IOPAMIDOL (ISOVUE-300) INJECTION 61% COMPARISON:  Multiple CT scans since September 15, 2014 FINDINGS: Lower chest: No acute abnormality. Hepatobiliary: 2 small calcified stones are seen in the gallbladder with no wall thickening or pericholecystic fluid. The liver and portal vein are normal in appearance. Pancreas: Unremarkable. No pancreatic ductal dilatation or surrounding inflammatory changes. Spleen: Normal in size without focal abnormality. Adrenals/Urinary Tract: The adrenal glands are normal. Probable tiny cysts in the kidneys. The kidneys are otherwise normal. No ureterectasis or ureteral stones. The bladder is within normal limits. Stomach/Bowel: The stomach is normal. The small bowel is normal as well with no inflammation identified. The patient is status post partial colectomy. The rectum is decompressed limiting evaluation but there is no adjacent fat stranding. There is fluid in the remaining colon without wall thickening. Vascular/Lymphatic: No significant vascular findings are present. No enlarged abdominal or pelvic lymph nodes. Reproductive: Prostate is unremarkable. Other: Again noted is a tract extending from the right lateral aspect of the rectum to the skin surface. The most distal aspect of this a tract appears slightly expanded on today's study compared to the previous with some slight central decreased attenuation. Musculoskeletal: Mild irregularity in the inferior SI joints is identified. No acute bony changes. IMPRESSION: 1. The fistula between the right lateral aspect of the rectum and the skin is again identified. The portion of the fistula immediately beneath the  skin is more prominent in caliber in the interval with central decreased attenuation worrisome for a developing fluid collection in the distal fistula immediately beneath the cutaneous surface. A developing abscess cannot be excluded. Recommend  clinical correlation. 2. Fluid throughout the remaining colon consistent with history of diarrhea. No colonic or small bowel inflammation identified. The rectum is poorly evaluated due to lack of distention. 3. 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 Electronically Signed   By: Dorise Bullion III M.D   On: 06/22/2016 19:56   Dg Chest Port 1 View  Result Date: 06/23/2016 CLINICAL DATA:  Sepsis EXAM: PORTABLE CHEST 1 VIEW COMPARISON:  03/21/2015 FINDINGS: The heart size and mediastinal contours are within normal limits. Both lungs are clear. The visualized skeletal structures are unremarkable. IMPRESSION: No active disease. Electronically Signed   By: Donavan Foil M.D.   On: 06/23/2016 00:10    Anti-infectives: Anti-infectives    Start     Dose/Rate Route Frequency Ordered Stop   06/23/16 1000  piperacillin-tazobactam (ZOSYN) IVPB 3.375 g     3.375 g 12.5 mL/hr over 240 Minutes Intravenous Every 8 hours 06/23/16 0851     06/23/16 0200  piperacillin-tazobactam (ZOSYN) IVPB 4.5 g  Status:  Discontinued     4.5 g 25 mL/hr over 240 Minutes Intravenous Every 8 hours 06/22/16 2354 06/23/16 0851   06/22/16 2345  piperacillin-tazobactam (ZOSYN) IVPB 3.375 g  Status:  Discontinued     3.375 g 100 mL/hr over 30 Minutes Intravenous  Once 06/22/16 2337 06/22/16 2347   06/22/16 1745  piperacillin-tazobactam (ZOSYN) IVPB 3.375 g     3.375 g 100 mL/hr over 30 Minutes Intravenous  Once 06/22/16 1743 06/22/16 1830   06/22/16 1745  vancomycin (VANCOCIN) IVPB 1000 mg/200 mL premix     1,000 mg 200 mL/hr over 60 Minutes Intravenous  Once 06/22/16 1743 06/22/16 1855      Assessment/Plan:  White blood cell count is improved considerably but he was febrile last night. He continues to have some right flank pain of unclear etiology without signs of peritoneal irritation.  He also has signs of a possibly infected fistula with spontaneously drained abscess. At this time it is  not very large and is certainly not very tender and is spontaneously draining minimal purulence. I do not see the need to go to the operating room for this however if he continues to be febrile exam under anesthesia with drainage may be indicated. This was discussed with prime doc and I would recommend continuing IV antibiotics and reexamination  Florene Glen, MD, FACS  06/24/2016

## 2016-06-24 NOTE — Consult Note (Signed)
Leroy Lame, MD [address] [place], [place] [address] Phone: [phone] Fax : [phone]  Consultation  Referring Provider:     Dr. Benjie Karvonen Primary Care Physician:  [name], MD Primary Gastroenterologist:  [name]        Reason for Consultation:     Crohn's disease  Date of Admission:  06/22/2016 Date of Consultation:  06/24/2016         HPI:   Leroy Hernandez is a 31 y.o. male who I have been following with Crohn's disease for some time. The patient was admitted with a perianal abscess.  The patient has had this for some time and it has intermittent bleeding.  The patient states that his symptoms of abdominal pain and rectal pain started after he ate a meal with a lot of hot sauce.  He denies that his symptoms feel like his typical Crohn's.  The patient was found to have an abscess around his anus and was seen by surgery.  The patient reports approximately once a year this abscess flares up with drainage and blood.  He also reports that he has seen some blood on his stools but only painted on the stools with blood on the toilet paper.  There is no report of any increased diarrhea.  The patient has been on Steroids  nd was started on Vedolizumab recently.  He states that this has been working well for him.  Past Medical History:  Diagnosis Date  . Anxiety   . Asthma   . Crohn's disease ([place])   . Rectal fistula     Past Surgical History:  Procedure Laterality Date  . COLON RESECTION    . COLON SURGERY    . COLONOSCOPY WITH PROPOFOL N/A 04/03/2015   Procedure: COLONOSCOPY WITH PROPOFOL;  Surgeon: Hulen Luster, MD;  [facility] END[facility];  Service: Endoscopy;  Laterality: N/A;  . COLONOSCOPY WITH PROPOFOL N/A 12/22/2015   Procedure: COLONOSCOPY WITH PROPOFOL;  Surgeon: Leroy Lame, MD;  Location: [facility];  Service: Endoscopy;  Laterality: N/A;  . rectal fistula surgery    . WRIST SURGERY      Prior to Admission medications     Medication Sig Start Date End Date Taking? Authorizing Provider  azaTHIOprine (IMURAN) 50 MG tablet Take three tablets by mouth daily 08/22/15  Yes Leroy Lame, MD  busPIRone (BUSPAR) 5 MG tablet Take 1 tablet (5 mg total) by mouth 2 (two) times daily. 05/27/16  Yes Hinda Kehr, MD  CIMZIA PREFILLED 2 X 200 MG/ML KIT Inject 1 Dose into the muscle every 14 (fourteen) days.  05/25/16  Yes [provider]  escitalopram (LEXAPRO) 10 MG tablet Take 10 mg by mouth daily.  04/02/15  Yes [provider]  predniSONE (DELTASONE) 10 MG tablet Take 40m daily x 2 weeks, then 364mdaily x 2 weeks, then 2049maily x 2 weeks, then 4m62mily x 2 weeks. 04/29/16  Yes RobiMerlyn Lot  PROAIR HFA 108 ([phone]) MCG/ACT inhaler Inhale 2 puffs into the lungs every 4 (four) hours as needed. 02/27/16  Yes [provider]  traMADol (ULTRAM) 50 MG tablet Take 1 tablet (50 mg total) by mouth every 6 (six) hours as needed. Patient not taking: Reported on 06/22/2016 12/24/15   WebsLoney Hering    Family History  Problem Relation Age of Onset  . Diabetes Paternal Grandfather   . Heart disease Paternal Grandfather      Social  History  Substance Use Topics  . Smoking status: Never Smoker  . Smokeless tobacco: Former Systems developer  . Alcohol use 1.2 oz/week    2 Cans of beer per week    Allergies as of 06/22/2016  . (No Known Allergies)    Review of Systems:    All systems reviewed and negative except where noted in HPI.   Physical Exam:  Vital signs in last 24 hours: Temp:  [98 F (36.7 C)-101.9 F (38.8 C)] 98.1 F (36.7 C) (05/17 2020) Pulse Rate:  [78-111] 78 (05/17 2020) Resp:  [16-20] 20 (05/17 2020) BP: (100-108)/(54-61) 100/61 (05/17 2020) SpO2:  [96 %-99 %] 97 % (05/17 2020) Last BM Date: 06/23/16 General:   Pleasant, cooperative in NAD Head:  Normocephalic and atraumatic. Eyes:   No icterus.   Conjunctiva pink. PERRLA. Ears:  Normal auditory acuity. Neck:  Supple;  no masses or thyroidomegaly Lungs: Respirations even and unlabored. Lungs clear to auscultation bilaterally.   No wheezes, crackles, or rhonchi.  Heart:  Regular rate and rhythm;  Without murmur, clicks, rubs or gallops Abdomen:  Soft, nondistended, nontender. Normal bowel sounds. No appreciable masses or hepatomegaly.  No rebound or guarding.  Rectal:  Not performed. Msk:  Symmetrical without gross deformities.   Extremities:  Without edema, cyanosis or clubbing. Neurologic:  Alert and oriented x3;  grossly normal neurologically. Skin:  Intact without significant lesions or rashes. Cervical Nodes:  No significant cervical adenopathy. Psych:  Alert and cooperative. Normal affect.  LAB RESULTS:  Recent Labs  06/22/16 1648 06/23/16 0029 06/24/16 0545  WBC 15.2* 11.4* 5.7  HGB 14.9 13.8 13.1  HCT 44.1 40.2 38.3*  PLT 234 213 187   BMET  Recent Labs  06/22/16 1648 06/23/16 0029 06/24/16 0545  NA 134* 138 137  K 2.9* 3.8 3.5  CL 101 105 106  CO2 _0 GLUCOSE 125* 98 88  BUN _1 CREATININE 0.96 0.95 1.04  CALCIUM 9.2 8.4* 8.2*   LFT  Recent Labs  06/23/16 0029  PROT 7.0  ALBUMIN 3.8  AST 26  ALT 12*  ALKPHOS 68  BILITOT 1.4*   PT/INR  Recent Labs  06/22/16 1648 06/23/16 0029  LABPROT 12.8 13.9  INR 0.96 1.07    STUDIES: Dg Chest Port 1 View  Result Date: 06/23/2016 CLINICAL DATA:  Sepsis EXAM: PORTABLE CHEST 1 VIEW COMPARISON:  03/21/2015 FINDINGS: The heart size and mediastinal contours are within normal limits. Both lungs are clear. The visualized skeletal structures are unremarkable. IMPRESSION: No active disease. Electronically Signed   By: Donavan Foil M.D.   On: 06/23/2016 00:10      Impression / Plan:   Leroy Hernandez is a 31 y.o. y/o male with a history of Crohn's disease who is presently taking Vedolizumab.  The patient had a fever last night which may be related to his perianal abscess.  The patient does not appear to have an  active flare of Crohn's at this time.  The patient will have his blood fentanyl for C-reactive protein although this may be high due to his infection.  The patient has been explained the plan and agrees with it.    Thank you for involving me in the care of this patient.      LOS: 2 days   Leroy Lame, MD  06/24/2016, 8:22 PM   Note: This dictation was prepared with Dragon dictation along with smaller phrase technology. Any transcriptional errors that result from this  process are unintentional.

## 2016-06-25 LAB — CBC
HCT: 34 % — ABNORMAL LOW (ref 40.0–52.0)
HEMOGLOBIN: 12 g/dL — AB (ref 13.0–18.0)
MCH: 29.7 pg (ref 26.0–34.0)
MCHC: 35.2 g/dL (ref 32.0–36.0)
MCV: 84.4 fL (ref 80.0–100.0)
Platelets: 181 10*3/uL (ref 150–440)
RBC: 4.03 MIL/uL — AB (ref 4.40–5.90)
RDW: 14.3 % (ref 11.5–14.5)
WBC: 4.4 10*3/uL (ref 3.8–10.6)

## 2016-06-25 LAB — BASIC METABOLIC PANEL
ANION GAP: 6 (ref 5–15)
BUN: 6 mg/dL (ref 6–20)
CO2: 24 mmol/L (ref 22–32)
Calcium: 7.9 mg/dL — ABNORMAL LOW (ref 8.9–10.3)
Chloride: 110 mmol/L (ref 101–111)
Creatinine, Ser: 0.78 mg/dL (ref 0.61–1.24)
GFR calc Af Amer: >60 mL/min (ref >60)
GFR calc non Af Amer: >60 mL/min (ref >60)
GLUCOSE: 107 mg/dL — AB (ref 65–99)
POTASSIUM: 3.4 mmol/L — AB (ref 3.5–5.1)
Sodium: 140 mmol/L (ref 135–145)

## 2016-06-25 LAB — C-REACTIVE PROTEIN: CRP: 10.8 mg/dL — ABNORMAL HIGH (ref <1.0)

## 2016-06-25 MED ORDER — AMOXICILLIN-POT CLAVULANATE 875-125 MG PO TABS: 1.0000 | ORAL_TABLET | Freq: Two times a day (BID) | ORAL | 0 refills | Status: DC

## 2016-06-25 MED ORDER — PREDNISONE 20 MG PO TABS: 40.0000 mg | ORAL_TABLET | Freq: Every day | ORAL | 0 refills | Status: AC

## 2016-06-25 MED ORDER — PREDNISONE 20 MG PO TABS: 40.0000 mg | ORAL_TABLET | Freq: Every day | ORAL | 0 refills | Status: DC

## 2016-06-25 MED ORDER — POTASSIUM CHLORIDE CRYS ER 20 MEQ PO TBCR
40.0000 meq | EXTENDED_RELEASE_TABLET | Freq: Once | ORAL | Status: AC
  Administered 2016-06-25: 40 meq via ORAL
  Filled 2016-06-25: qty 2

## 2016-06-25 NOTE — Progress Notes (Signed)
CC: Abdominal pain and perirectal pain Subjective: This patient with perirectal pain which is almost gone and completely improved. He also has some right flank pain. Patient is much improved today he has no nausea vomiting fevers or chills  Objective: Vital signs in last 24 hours: Temp:  [97.7 F (36.5 C)-98.1 F (36.7 C)] 97.7 F (36.5 C) (05/18 0420) Pulse Rate:  [69-95] 69 (05/18 0420) Resp:  [20] 20 (05/18 0420) BP: (100-107)/(54-61) 100/61 (05/18 0420) SpO2:  [97 %-100 %] 100 % (05/18 0420) Last BM Date: 06/24/16  Intake/Output from previous day: 05/17 0701 - 05/18 0700 In: 5591.9 [P.O.:1920; I.V.:3571.9; IV Piggyback:100] Out: 1200 [Urine:1200] Intake/Output this shift: No intake/output data recorded.  Physical exam:  Afebrile soft abdomen minimally tender in the right upper quadrant right flank area Right buttock area demonstrates much less induration almost no erythema and no drainage and no tenderness today.  Lab Results: CBC   Recent Labs  06/24/16 0545 06/25/16 0531  WBC 5.7 4.4  HGB 13.1 12.0*  HCT 38.3* 34.0*  PLT 187 181   BMET  Recent Labs  06/24/16 0545 06/25/16 0531  NA 137 140  K 3.5 3.4*  CL 106 110  CO2 25 24  GLUCOSE 88 107*  BUN 7 6  CREATININE 1.04 0.78  CALCIUM 8.2* 7.9*   PT/INR  Recent Labs  06/22/16 1648 06/23/16 0029  LABPROT 12.8 13.9  INR 0.96 1.07   ABG No results for input(s): PHART, HCO3 in the last 72 hours.  Invalid input(s): PCO2, PO2  Studies/Results: No results found.  Anti-infectives: Anti-infectives    Start     Dose/Rate Route Frequency Ordered Stop   06/23/16 1000  piperacillin-tazobactam (ZOSYN) IVPB 3.375 g     3.375 g 12.5 mL/hr over 240 Minutes Intravenous Every 8 hours 06/23/16 0851     06/23/16 0200  piperacillin-tazobactam (ZOSYN) IVPB 4.5 g  Status:  Discontinued     4.5 g 25 mL/hr over 240 Minutes Intravenous Every 8 hours 06/22/16 2354 06/23/16 0851   06/22/16 2345   piperacillin-tazobactam (ZOSYN) IVPB 3.375 g  Status:  Discontinued     3.375 g 100 mL/hr over 30 Minutes Intravenous  Once 06/22/16 2337 06/22/16 2347   06/22/16 1745  piperacillin-tazobactam (ZOSYN) IVPB 3.375 g     3.375 g 100 mL/hr over 30 Minutes Intravenous  Once 06/22/16 1743 06/22/16 1830   06/22/16 1745  vancomycin (VANCOCIN) IVPB 1000 mg/200 mL premix     1,000 mg 200 mL/hr over 60 Minutes Intravenous  Once 06/22/16 1743 06/22/16 1855      Assessment/Plan:  Afebrile with a normal white count and a much improved abdominal and perirectal exam. Recommend discharge on oral antibiotics to follow-up in our office in 10 days this is discussed with Dr. Genia Harold.  Florene Glen, MD, FACS  06/25/2016

## 2016-06-25 NOTE — Progress Notes (Signed)
MD ordered patient to be discharged home.  Discharge instructions were reviewed with the patient and he voiced understanding.  Follow-up appointments were made.  Prescriptions given to the patient.  IV was removed with catheter intact.  All patients questions were answered.  Patient leaving via wheelchair escorted by auxillary.

## 2016-06-25 NOTE — Progress Notes (Signed)
Leroy Hernandez was admitted to the [facility] on 06/22/2016 and Discharged  06/25/2016 and should be excused from work/school   for 5  days starting 06/22/2016 , may return to work/school without any restrictions.  Call Bettey Costa MD with questions.  [name] M.D on 06/25/2016,at 9:33 AM  [place] [facility]  [phone]

## 2016-06-25 NOTE — Discharge Summary (Signed)
[facility] NAME: Leroy Hernandez    MR#:  [id number]  DATE OF BIRTH:  1985-06-29  DATE OF ADMISSION:  06/22/2016 ADMITTING PHYSICIAN: Harvie Bridge, DO  DATE OF DISCHARGE: 06/25/2016  PRIMARY CARE PHYSICIAN: [name], MD    ADMISSION DIAGNOSIS:  Tachycardia [R00.0] Generalized abdominal pain [R10.84] Elevated lactic acid level [R79.89] Crohn's disease with complication, unspecified gastrointestinal tract location ([place]) [K50.919]  DISCHARGE DIAGNOSIS:  Active Problems:   Sepsis ([place])   Perirectal fistula   Generalized abdominal pain   SECONDARY DIAGNOSIS:   Past Medical History:  Diagnosis Date  . Anxiety   . Asthma   . Crohn's disease ([place])   . Rectal fistula     HOSPITAL COURSE:   31 year old male with a history of Crohn's disease and rectal fistula was admitted for sepsis.   1. Sepsis: Patient admitted with fever, leukocytosis, tachycardia Sepsis was due to rectocutaneous fistula. Patient has been afebrile for 24 hours   2. Mild Crohn's exacerbation: He will Continue oral prednisone and Imuran GI consult appreciated Hemoglobin stable  3. Rectocutaneous fistula with abscess: Patient was evaluated by surgery. He was placed on IV Zosyn. As per surgery abdominal and perirectal exam has improved. Patient did not need surgical management. The abscess drain on its own. Patient will be discharged on oral Augmentin.  4. Hypokalemia: This has been repleted   DISCHARGE CONDITIONS AND DIET:   Stable for discharge on regular diet  CONSULTS OBTAINED:  Treatment Team:  Florene Glen, MD [name], MD  DRUG ALLERGIES:  No Known Allergies  DISCHARGE MEDICATIONS:   Current Discharge Medication List    START taking these medications   Details  amoxicillin-clavulanate (AUGMENTIN) 875-125 MG tablet Take 1 tablet by mouth 2 (two) times daily. Qty: 16 tablet, Refills: 0      CONTINUE  these medications which have CHANGED   Details  predniSONE (DELTASONE) 20 MG tablet Take 2 tablets (40 mg total) by mouth daily with breakfast. Qty: 6 tablet, Refills: 0      CONTINUE these medications which have NOT CHANGED   Details  azaTHIOprine (IMURAN) 50 MG tablet Take three tablets by mouth daily Qty: 90 tablet, Refills: 11    busPIRone (BUSPAR) 5 MG tablet Take 1 tablet (5 mg total) by mouth 2 (two) times daily. Qty: 60 tablet, Refills: 0    CIMZIA PREFILLED 2 X 200 MG/ML KIT Inject 1 Dose into the muscle every 14 (fourteen) days.     escitalopram (LEXAPRO) 10 MG tablet Take 10 mg by mouth daily.     PROAIR HFA 108 (90 Base) MCG/ACT inhaler Inhale 2 puffs into the lungs every 4 (four) hours as needed.    traMADol (ULTRAM) 50 MG tablet Take 1 tablet (50 mg total) by mouth every 6 (six) hours as needed. Qty: 12 tablet, Refills: 0          Today   CHIEF COMPLAINT:   Doing well without any acute events overnight   VITAL SIGNS:  Blood pressure 100/61, pulse 69, temperature 97.7 F (36.5 C), temperature source Oral, resp. rate 20, height 5' 5"  (1.651 m), weight 64.1 kg (141 lb 4.8 oz), SpO2 100 %.   REVIEW OF SYSTEMS:  Review of Systems  Constitutional: Negative.  Negative for chills, fever and malaise/fatigue.  HENT: Negative.  Negative for ear discharge, ear pain, hearing loss, nosebleeds and sore throat.   Eyes: Negative.  Negative for blurred vision and pain.  Respiratory: Negative.  Negative for cough, hemoptysis, shortness of breath and wheezing.   Cardiovascular: Negative.  Negative for chest pain, palpitations and leg swelling.  Gastrointestinal: Negative.  Negative for abdominal pain, blood in stool, diarrhea, nausea and vomiting.  Genitourinary: Negative.  Negative for dysuria.  Musculoskeletal: Negative.  Negative for back pain.  Skin: Negative.   Neurological: Negative for dizziness, tremors, speech change, focal weakness, seizures and headaches.   Endo/Heme/Allergies: Negative.  Does not bruise/bleed easily.  Psychiatric/Behavioral: Negative.  Negative for depression, hallucinations and suicidal ideas.     PHYSICAL EXAMINATION:  GENERAL:  31 y.o.-year-old patient lying in the bed with no acute distress.  NECK:  Supple, no jugular venous distention. No thyroid enlargement, no tenderness.  LUNGS: Normal breath sounds bilaterally, no wheezing, rales,rhonchi  No use of accessory muscles of respiration.  CARDIOVASCULAR: S1, S2 normal. No murmurs, rubs, or gallops.  ABDOMEN: Soft, non-tender, non-distended. Bowel sounds present. No organomegaly or mass.  EXTREMITIES: No pedal edema, cyanosis, or clubbing.  PSYCHIATRIC: The patient is alert and oriented x 3.  SKIN: No obvious rash, lesion, or ulcer.   DATA REVIEW:   CBC  Recent Labs Lab 06/25/16 0531  WBC 4.4  HGB 12.0*  HCT 34.0*  PLT 181    Chemistries   Recent Labs Lab 06/23/16 0029  06/25/16 0531  NA 138  < > 140  K 3.8  < > 3.4*  CL 105  < > 110  CO2 27  < > 24  GLUCOSE 98  < > 107*  BUN 7  < > 6  CREATININE 0.95  < > 0.78  CALCIUM 8.4*  < > 7.9*  MG 1.8  --   --   AST 26  --   --   ALT 12*  --   --   ALKPHOS 68  --   --   BILITOT 1.4*  --   --   < > = values in this interval not displayed.  Cardiac Enzymes No results for input(s): TROPONINI in the last 168 hours.  Microbiology Results  @MICRORSLT48 @  RADIOLOGY:  No results found.    Current Discharge Medication List    START taking these medications   Details  amoxicillin-clavulanate (AUGMENTIN) 875-125 MG tablet Take 1 tablet by mouth 2 (two) times daily. Qty: 16 tablet, Refills: 0      CONTINUE these medications which have CHANGED   Details  predniSONE (DELTASONE) 20 MG tablet Take 2 tablets (40 mg total) by mouth daily with breakfast. Qty: 6 tablet, Refills: 0      CONTINUE these medications which have NOT CHANGED   Details  azaTHIOprine (IMURAN) 50 MG tablet Take three tablets by  mouth daily Qty: 90 tablet, Refills: 11    busPIRone (BUSPAR) 5 MG tablet Take 1 tablet (5 mg total) by mouth 2 (two) times daily. Qty: 60 tablet, Refills: 0    CIMZIA PREFILLED 2 X 200 MG/ML KIT Inject 1 Dose into the muscle every 14 (fourteen) days.     escitalopram (LEXAPRO) 10 MG tablet Take 10 mg by mouth daily.     PROAIR HFA 108 (90 Base) MCG/ACT inhaler Inhale 2 puffs into the lungs every 4 (four) hours as needed.    traMADol (ULTRAM) 50 MG tablet Take 1 tablet (50 mg total) by mouth every 6 (six) hours as needed. Qty: 12 tablet, Refills: 0           Management plans discussed with the patient and he is in  agreement. Stable for discharge home  Patient should follow up with pcp  CODE STATUS:     Code Status Orders        Start     Ordered   06/22/16 2338  Full code  Continuous     06/22/16 2337    Code Status History    Date Active Date Inactive Code Status Order ID Comments User Context   This patient has a current code status but no historical code status.      TOTAL TIME TAKING CARE OF THIS PATIENT: 37 minutes.    Note: This dictation was prepared with Dragon dictation along with smaller phrase technology. Any transcriptional errors that result from this process are unintentional.  Sherelle Castelli M.D on 06/25/2016 at 9:31 AM  Between 7am to 6pm - Pager - [phone] After 6pm go to [email or web address] - password [email or web address] [place] [facility]  Office  [phone]  CC: Primary care physician; [name], MD

## 2016-06-27 LAB — CULTURE, BLOOD (ROUTINE X 2)
Culture: NO GROWTH
Culture: NO GROWTH
Special Requests: ADEQUATE
Specimen Description: ADEQUATE

## 2016-07-20 ENCOUNTER — Ambulatory Visit: Payer: BC Managed Care – PPO | Admitting: Gastroenterology

## 2016-07-31 ENCOUNTER — Encounter: Payer: Self-pay | Admitting: Emergency Medicine

## 2016-07-31 ENCOUNTER — Emergency Department
Admission: EM | Admit: 2016-07-31 | Discharge: 2016-07-31 | Disposition: A | Discharge status: Home / Self Care | Payer: BC Managed Care – PPO | Attending: Emergency Medicine | Admitting: Emergency Medicine

## 2016-07-31 DIAGNOSIS — Z79899 Other long term (current) drug therapy: Secondary | ICD-10-CM | POA: Insufficient documentation

## 2016-07-31 DIAGNOSIS — K50111 Crohn's disease of large intestine with rectal bleeding: Secondary | ICD-10-CM | POA: Insufficient documentation

## 2016-07-31 DIAGNOSIS — K611 Rectal abscess: Secondary | ICD-10-CM | POA: Diagnosis not present

## 2016-07-31 DIAGNOSIS — J45909 Unspecified asthma, uncomplicated: Secondary | ICD-10-CM | POA: Insufficient documentation

## 2016-07-31 DIAGNOSIS — Z87891 Personal history of nicotine dependence: Secondary | ICD-10-CM | POA: Insufficient documentation

## 2016-07-31 DIAGNOSIS — K6289 Other specified diseases of anus and rectum: Secondary | ICD-10-CM | POA: Diagnosis present

## 2016-07-31 LAB — COMPREHENSIVE METABOLIC PANEL
ALT: 11 U/L — AB (ref 17–63)
AST: 19 U/L (ref 15–41)
Albumin: 3.9 g/dL (ref 3.5–5.0)
Alkaline Phosphatase: 85 U/L (ref 38–126)
Anion gap: 6 (ref 5–15)
BILIRUBIN TOTAL: 1.1 mg/dL (ref 0.3–1.2)
BUN: 8 mg/dL (ref 6–20)
CO2: 25 mmol/L (ref 22–32)
CREATININE: 0.92 mg/dL (ref 0.61–1.24)
Calcium: 8.9 mg/dL (ref 8.9–10.3)
Chloride: 110 mmol/L (ref 101–111)
GFR calc Af Amer: >60 mL/min (ref >60)
Glucose, Bld: 130 mg/dL — ABNORMAL HIGH (ref 65–99)
Potassium: 3.5 mmol/L (ref 3.5–5.1)
Sodium: 141 mmol/L (ref 135–145)
TOTAL PROTEIN: 6.6 g/dL (ref 6.5–8.1)

## 2016-07-31 LAB — CBC
HCT: 37.2 % — ABNORMAL LOW (ref 40.0–52.0)
Hemoglobin: 13.1 g/dL (ref 13.0–18.0)
MCH: 29.2 pg (ref 26.0–34.0)
MCHC: 35.1 g/dL (ref 32.0–36.0)
MCV: 83.2 fL (ref 80.0–100.0)
PLATELETS: 323 10*3/uL (ref 150–440)
RBC: 4.47 MIL/uL (ref 4.40–5.90)
RDW: 14.5 % (ref 11.5–14.5)
WBC: 7.1 10*3/uL (ref 3.8–10.6)

## 2016-07-31 LAB — LACTIC ACID, PLASMA: Lactic Acid, Venous: 1.2 mmol/L (ref 0.5–1.9)

## 2016-07-31 MED ORDER — PREDNISONE 20 MG PO TABS: 60.0000 mg | ORAL_TABLET | Freq: Every day | ORAL | 0 refills | Status: AC

## 2016-07-31 MED ORDER — SULFAMETHOXAZOLE-TRIMETHOPRIM 800-160 MG PO TABS: 1.0000 | ORAL_TABLET | Freq: Two times a day (BID) | ORAL | 0 refills | Status: AC

## 2016-07-31 MED ORDER — METHYLPREDNISOLONE SODIUM SUCC 125 MG IJ SOLR
125.0000 mg | Freq: Once | INTRAMUSCULAR | Status: AC
  Administered 2016-07-31: 125 mg via INTRAVENOUS
  Filled 2016-07-31: qty 2

## 2016-07-31 MED ORDER — OXYCODONE-ACETAMINOPHEN 5-325 MG PO TABS: 1.0000 | ORAL_TABLET | Freq: Four times a day (QID) | ORAL | 0 refills | Status: DC | PRN

## 2016-07-31 MED ORDER — PIPERACILLIN-TAZOBACTAM 3.375 G IVPB 30 MIN
3.3750 g | Freq: Once | INTRAVENOUS | Status: AC
  Administered 2016-07-31: 3.375 g via INTRAVENOUS
  Filled 2016-07-31: qty 50

## 2016-07-31 NOTE — ED Triage Notes (Signed)
Rectal pain and drainage since yesterday. Also rectal bleed x 3 weeks. Stool approx 7 times a day.

## 2016-07-31 NOTE — ED Provider Notes (Signed)
[facility] Emergency Department Provider Note  ____________________________________________  Time seen: Approximately 3:04 PM  I have reviewed the triage vital signs and the nursing notes.   HISTORY  Chief Complaint Rectal Pain   HPI Leroy Hernandez is a 31 y.o. male with a history of Crohn's disease who presents for evaluation of rectal pain. Patient has a known enterocutaneous fistula for which he is followed by [name]. Also has had multiple prior rectal abscesses requiring drainage in the past. Patient endorses 3 weeks of the multiple daily episodes of watery diarrhea all with blood mixed in it which is consistent with his Crohn flares. Yesterday patient noted an induration in his rectal area with some purulent drainage which made him concerned to come to the emergency room for evaluation. He endorses moderate rectal pain that is worse with bowel movements, constant and nonradiating. He denies abdominal pain, fever, body aches, chills, nausea, or vomiting. He has not been seen by his GI doctor because he was working and does not like to miss work to see a Tax adviser. Patient endorses compliance with his medications. He is on Imuran and Cimzia.   Past Medical History:  Diagnosis Date  . Anxiety   . Asthma   . Crohn's disease ([place])   . Rectal fistula     Patient Active Problem List   Diagnosis Date Noted  . Generalized abdominal pain   . Perirectal fistula   . Sepsis ([place]) 06/22/2016  . Calculus of gallbladder without cholecystitis without obstruction 12/25/2015  . Gallbladder polyp 12/25/2015  . Blood in stool   . Intestinal bypass or anastomosis status   . Crohn's disease of both small and large intestine with rectal bleeding ([place])   . Crohn's disease ([employer]) 08/22/2015  . Mild episode of recurrent major depressive disorder ([place]) 04/02/2015  . Asthma, mild intermittent 04/02/2015  . Crohn's disease of colon ([place]) 02/06/2015    Past Surgical  History:  Procedure Laterality Date  . COLON RESECTION    . COLON SURGERY    . COLONOSCOPY WITH PROPOFOL N/A 04/03/2015   Procedure: COLONOSCOPY WITH PROPOFOL;  Surgeon: Hulen Luster, MD;  Location: [facility] ENDOSCOPY;  Service: Endoscopy;  Laterality: N/A;  . COLONOSCOPY WITH PROPOFOL N/A 12/22/2015   Procedure: COLONOSCOPY WITH PROPOFOL;  Surgeon: [name], MD;  Location: [facility];  Service: Endoscopy;  Laterality: N/A;  . rectal fistula surgery    . WRIST SURGERY      Prior to Admission medications   Medication Sig Start Date End Date Taking? Authorizing Provider  azaTHIOprine (IMURAN) 50 MG tablet Take three tablets by mouth daily 08/22/15  Yes [name], MD  busPIRone (BUSPAR) 5 MG tablet Take 1 tablet (5 mg total) by mouth 2 (two) times daily. 05/27/16  Yes Hinda Kehr, MD  CIMZIA PREFILLED 2 X 200 MG/ML KIT Inject 1 Dose into the muscle every 14 (fourteen) days.  05/25/16  Yes [provider]  escitalopram (LEXAPRO) 10 MG tablet Take 10 mg by mouth daily.  04/02/15  Yes [provider]  PROAIR HFA 108 (90 Base) MCG/ACT inhaler Inhale 2 puffs into the lungs every 4 (four) hours as needed. 02/27/16  Yes [provider]  oxyCODONE-acetaminophen (ROXICET) 5-325 MG tablet Take 1 tablet by mouth every 6 (six) hours as needed. 07/31/16 07/31/17  Rudene Re, MD  predniSONE (DELTASONE) 20 MG tablet Take 3 tablets (60 mg total) by mouth daily. 07/31/16 08/05/16  Rudene Re, MD  sulfamethoxazole-trimethoprim (BACTRIM DS,SEPTRA  DS) 800-160 MG tablet Take 1 tablet by mouth 2 (two) times daily. 07/31/16 08/10/16  Rudene Re, MD  traMADol (ULTRAM) 50 MG tablet Take 1 tablet (50 mg total) by mouth every 6 (six) hours as needed. Patient not taking: Reported on 06/22/2016 12/24/15   Loney Hering, MD    Allergies Patient has no known allergies.  Family History  Problem Relation Age of Onset  . Diabetes Paternal Grandfather   . Heart disease  Paternal Grandfather     Social History Social History  Substance Use Topics  . Smoking status: Never Smoker  . Smokeless tobacco: Former Systems developer  . Alcohol use 1.2 oz/week    2 Cans of beer per week    Review of Systems  Constitutional: Negative for fever. Eyes: Negative for visual changes. ENT: Negative for sore throat. Neck: No neck pain  Cardiovascular: Negative for chest pain. Respiratory: Negative for shortness of breath. Gastrointestinal: Negative for abdominal pain, vomiting. + rectal pain, diarrhea, and hematochezia Genitourinary: Negative for dysuria. Musculoskeletal: Negative for back pain. Skin: Negative for rash. Neurological: Negative for headaches, weakness or numbness. Psych: No SI or HI  ____________________________________________   PHYSICAL EXAM:  VITAL SIGNS: ED Triage Vitals  Enc Vitals Group     BP 07/31/16 1345 120/78     Pulse Rate 07/31/16 1345 99     Resp 07/31/16 1345 18     Temp 07/31/16 1345 97.9 F (36.6 C)     Temp Source 07/31/16 1345 Oral     SpO2 07/31/16 1345 99 %     Weight 07/31/16 1346 135 lb (61.2 kg)     Height 07/31/16 1346 _0  (1.651 m)     Head Circumference --      Peak Flow --      Pain Score 07/31/16 1345 5     Pain Loc --      Pain Edu? --      Excl. in [place]? --     Constitutional: Alert and oriented. Well appearing and in no apparent distress. HEENT:      Head: Normocephalic and atraumatic.         Eyes: Conjunctivae are normal. Sclera is non-icteric.       Mouth/Throat: Mucous membranes are moist.       Neck: Supple with no signs of meningismus. Cardiovascular: Regular rate and rhythm. No murmurs, gallops, or rubs. 2+ symmetrical distal pulses are present in all extremities. No JVD. Respiratory: Normal respiratory effort. Lungs are clear to auscultation bilaterally. No wheezes, crackles, or rhonchi.  Gastrointestinal: Soft, non tender, and non distended with positive bowel sounds. No rebound or guarding. Rectal  exam showing a very small peri-rectal abscess (0.5cm) with active purulent drainage and no induration, rectal exam with no induration and brown stool  Genitourinary: No CVA tenderness. Musculoskeletal: Nontender with normal range of motion in all extremities. No edema, cyanosis, or erythema of extremities. Neurologic: Normal speech and language. Face is symmetric. Moving all extremities. No gross focal neurologic deficits are appreciated. Skin: Skin is warm, dry and intact. No rash noted. Psychiatric: Mood and affect are normal. Speech and behavior are normal.  ____________________________________________   LABS (all labs ordered are listed, but only abnormal results are displayed)  Labs Reviewed  COMPREHENSIVE METABOLIC PANEL - Abnormal; Notable for the following:       Result Value   Glucose, Bld 130 (*)    ALT 11 (*)    All other components within normal limits  CBC -  Abnormal; Notable for the following:    HCT 37.2 (*)    All other components within normal limits  LACTIC ACID, PLASMA   ____________________________________________  EKG  none ____________________________________________  RADIOLOGY  none  ____________________________________________   PROCEDURES  Procedure(s) performed: None Procedures Critical Care performed:  None ____________________________________________   INITIAL IMPRESSION / ASSESSMENT AND PLAN / ED COURSE  31 y.o. male with a history of Crohn's disease who presents for evaluation of rectal pain, drainage, and diarrhea. Patient is extremely well-appearing, normal vital signs, abdomen is soft and nontender throughout, rectal exam shows a less than half a centimeter abscess actively draining, abscess does not seem to be tracking in the anal region. Blood work showing normal white count, normal lactate. At this time I had a long discussion with patient about risks and benefits of doing a CT scan based on the benign exam, normal vitals, no systemic  symptoms, and normal blood work. Since this is a Crohn's patient who will have multiple CT scans during his lifespan I do not feel at this time the patient will benefit from a CT. I'm starting him on a steroid taper for Crohn's flair, antibiotics for the abscess and I will have him follow up with his GI doctor on Monday for close evaluation. I recommended that he returns to the emergency room if he spikes a fever, if his rectal pain gets worse, if he has abdominal pain, worse pains or not improving within 24-48 hours.     Pertinent labs & imaging results that were available during my care of the patient were reviewed by me and considered in my medical decision making (see chart for details).    ____________________________________________   FINAL CLINICAL IMPRESSION(S) / ED DIAGNOSES  Final diagnoses:  Peri-rectal abscess  Crohn's disease of colon with rectal bleeding ([place])      NEW MEDICATIONS STARTED DURING THIS VISIT:  New Prescriptions   OXYCODONE-ACETAMINOPHEN (ROXICET) 5-325 MG TABLET    Take 1 tablet by mouth every 6 (six) hours as needed.   PREDNISONE (DELTASONE) 20 MG TABLET    Take 3 tablets (60 mg total) by mouth daily.   SULFAMETHOXAZOLE-TRIMETHOPRIM (BACTRIM DS,SEPTRA DS) 800-160 MG TABLET    Take 1 tablet by mouth 2 (two) times daily.     Note:  This document was prepared using Dragon voice recognition software and may include unintentional dictation errors.    Alfred Levins, [place], MD 07/31/16 1520

## 2016-08-28 ENCOUNTER — Encounter: Payer: Self-pay | Admitting: Emergency Medicine

## 2016-08-28 ENCOUNTER — Emergency Department
Admission: EM | Admit: 2016-08-28 | Discharge: 2016-08-29 | Disposition: A | Discharge status: Home / Self Care | Payer: BC Managed Care – PPO | Attending: Emergency Medicine | Admitting: Emergency Medicine

## 2016-08-28 DIAGNOSIS — J45909 Unspecified asthma, uncomplicated: Secondary | ICD-10-CM | POA: Diagnosis not present

## 2016-08-28 DIAGNOSIS — Z79899 Other long term (current) drug therapy: Secondary | ICD-10-CM | POA: Insufficient documentation

## 2016-08-28 DIAGNOSIS — K5090 Crohn's disease, unspecified, without complications: Secondary | ICD-10-CM

## 2016-08-28 DIAGNOSIS — Z87891 Personal history of nicotine dependence: Secondary | ICD-10-CM | POA: Diagnosis not present

## 2016-08-28 DIAGNOSIS — K625 Hemorrhage of anus and rectum: Secondary | ICD-10-CM | POA: Diagnosis present

## 2016-08-28 LAB — COMPREHENSIVE METABOLIC PANEL
ALK PHOS: 95 U/L (ref 38–126)
ALT: 15 U/L — AB (ref 17–63)
AST: 18 U/L (ref 15–41)
Albumin: 4 g/dL (ref 3.5–5.0)
Anion gap: 7 (ref 5–15)
BUN: 8 mg/dL (ref 6–20)
CALCIUM: 8.8 mg/dL — AB (ref 8.9–10.3)
CO2: 28 mmol/L (ref 22–32)
CREATININE: 1.17 mg/dL (ref 0.61–1.24)
Chloride: 106 mmol/L (ref 101–111)
GFR calc non Af Amer: >60 mL/min (ref >60)
Glucose, Bld: 86 mg/dL (ref 65–99)
Potassium: 3.9 mmol/L (ref 3.5–5.1)
SODIUM: 141 mmol/L (ref 135–145)
Total Bilirubin: 0.9 mg/dL (ref 0.3–1.2)
Total Protein: 7.2 g/dL (ref 6.5–8.1)

## 2016-08-28 LAB — CBC
HCT: 40 % (ref 40.0–52.0)
Hemoglobin: 13.6 g/dL (ref 13.0–18.0)
MCH: 28.5 pg (ref 26.0–34.0)
MCHC: 33.9 g/dL (ref 32.0–36.0)
MCV: 84.2 fL (ref 80.0–100.0)
PLATELETS: 329 10*3/uL (ref 150–440)
RBC: 4.76 MIL/uL (ref 4.40–5.90)
RDW: 14.5 % (ref 11.5–14.5)
WBC: 4.5 10*3/uL (ref 3.8–10.6)

## 2016-08-28 LAB — TYPE AND SCREEN
ABO/RH(D): O NEG
Antibody Screen: NEGATIVE

## 2016-08-28 NOTE — ED Notes (Addendum)
Pt ambulatory with steady gait to stat registration, carrying a bag of chips; asked if it's okay for him to eat; pt understands to wait until cleared by MD to eat or drink anything; declined offer of blanket; sitting with male visitor

## 2016-08-28 NOTE — ED Triage Notes (Signed)
Pt presents to ED via POV c/o bright red rectal bleeding x2 weeks. Hx Crohn's disease and enterocutaneous fistula. C/o nausea but no vomiting.

## 2016-08-29 MED ORDER — PREDNISONE 20 MG PO TABS: 60.0000 mg | ORAL_TABLET | Freq: Every day | ORAL | 0 refills | Status: AC

## 2016-08-29 MED ORDER — METHYLPREDNISOLONE SODIUM SUCC 125 MG IJ SOLR
125.0000 mg | Freq: Once | INTRAMUSCULAR | Status: AC
  Administered 2016-08-29: 125 mg via INTRAVENOUS
  Filled 2016-08-29: qty 2

## 2016-08-29 MED ORDER — MORPHINE SULFATE (PF) 4 MG/ML IV SOLN
4.0000 mg | Freq: Once | INTRAVENOUS | Status: AC
  Administered 2016-08-29: 4 mg via INTRAVENOUS

## 2016-08-29 MED ORDER — DICYCLOMINE HCL 20 MG PO TABS: 20.0000 mg | ORAL_TABLET | Freq: Three times a day (TID) | ORAL | 0 refills | Status: DC | PRN

## 2016-08-29 MED ORDER — MORPHINE SULFATE (PF) 4 MG/ML IV SOLN
INTRAVENOUS | Status: AC
  Administered 2016-08-29: 4 mg via INTRAVENOUS
  Filled 2016-08-29: qty 1

## 2016-08-29 MED ORDER — ONDANSETRON HCL 4 MG/2ML IJ SOLN
INTRAMUSCULAR | Status: AC
  Administered 2016-08-29: 4 mg via INTRAVENOUS
  Filled 2016-08-29: qty 2

## 2016-08-29 MED ORDER — ONDANSETRON HCL 4 MG/2ML IJ SOLN
4.0000 mg | Freq: Once | INTRAMUSCULAR | Status: AC
  Administered 2016-08-29: 4 mg via INTRAVENOUS

## 2016-08-29 NOTE — ED Provider Notes (Signed)
[facility] Emergency Department Provider Note    First MD Initiated Contact with Patient 08/28/16 2345     (approximate)  I have reviewed the triage vital signs and the nursing notes.   HISTORY  Chief Complaint Rectal Bleeding   HPI Leroy Hernandez is a 31 y.o. male with below list medical conditions including Crohn's disease presents to the emergency department with 3 week history of bright red blood per rectum. Patient admits to intermittent abdominal cramping oh (current pain score 1 out of 10). patient denies any fever. Patient states symptoms consistent with previous Crohn's exacerbation. Patient currently receiving Humira and Cimzia Olla by [name] gastroenterologist.   Past Medical History:  Diagnosis Date  . Anxiety   . Asthma   . Crohn's disease ([place])   . Rectal fistula     Patient Active Problem List   Diagnosis Date Noted  . Generalized abdominal pain   . Perirectal fistula   . Sepsis ([place]) 06/22/2016  . Calculus of gallbladder without cholecystitis without obstruction 12/25/2015  . Gallbladder polyp 12/25/2015  . Blood in stool   . Intestinal bypass or anastomosis status   . Crohn's disease of both small and large intestine with rectal bleeding ([place])   . Crohn's disease ([place]) 08/22/2015  . Mild episode of recurrent major depressive disorder ([place]) 04/02/2015  . Asthma, mild intermittent 04/02/2015  . Crohn's disease of colon ([place]) 02/06/2015    Past Surgical History:  Procedure Laterality Date  . COLON RESECTION    . COLON SURGERY    . COLONOSCOPY WITH PROPOFOL N/A 04/03/2015   Procedure: COLONOSCOPY WITH PROPOFOL;  Surgeon: Hulen Luster, MD;  Location: [facility] ENDOSCOPY;  Service: Endoscopy;  Laterality: N/A;  . COLONOSCOPY WITH PROPOFOL N/A 12/22/2015   Procedure: COLONOSCOPY WITH PROPOFOL;  Surgeon: [name], MD;  Location: [facility];  Service: Endoscopy;  Laterality: N/A;  . rectal fistula surgery    . WRIST SURGERY       Prior to Admission medications   Medication Sig Start Date End Date Taking? Authorizing Provider  azaTHIOprine (IMURAN) 50 MG tablet Take three tablets by mouth daily 08/22/15   [name], MD  busPIRone (BUSPAR) 5 MG tablet Take 1 tablet (5 mg total) by mouth 2 (two) times daily. 05/27/16   Hinda Kehr, MD  CIMZIA PREFILLED 2 X 200 MG/ML KIT Inject 1 Dose into the muscle every 14 (fourteen) days.  05/25/16   [provider]  dicyclomine (BENTYL) 20 MG tablet Take 1 tablet (20 mg total) by mouth 3 (three) times daily as needed for spasms. 08/29/16 08/29/17  Gregor Hams, MD  escitalopram (LEXAPRO) 10 MG tablet Take 10 mg by mouth daily.  04/02/15   [provider]  oxyCODONE-acetaminophen (ROXICET) 5-325 MG tablet Take 1 tablet by mouth every 6 (six) hours as needed. 07/31/16 07/31/17  Rudene Re, MD  predniSONE (DELTASONE) 20 MG tablet Take 3 tablets (60 mg total) by mouth daily. 08/29/16 09/03/16  Gregor Hams, MD  PROAIR HFA 108 [phone] Base) MCG/ACT inhaler Inhale 2 puffs into the lungs every 4 (four) hours as needed. 02/27/16   [provider]  traMADol (ULTRAM) 50 MG tablet Take 1 tablet (50 mg total) by mouth every 6 (six) hours as needed. Patient not taking: Reported on 06/22/2016 12/24/15   Loney Hering, MD    Allergies No known drug allergies Family History  Problem Relation Age of Onset  . Diabetes Paternal Grandfather   .  Heart disease Paternal Grandfather     Social History Social History  Substance Use Topics  . Smoking status: Never Smoker  . Smokeless tobacco: Former Systems developer  . Alcohol use 1.2 oz/week    2 Cans of beer per week    Review of Systems Constitutional: No fever/chills Eyes: No visual changes. ENT: No sore throat. Cardiovascular: Denies chest pain. Respiratory: Denies shortness of breath. Gastrointestinal: Positive for cramping abdominal pain and bright red blood per rectum  Genitourinary: Negative for  dysuria. Musculoskeletal: Negative for neck pain.  Negative for back pain. Integumentary: Negative for rash. Neurological: Negative for headaches, focal weakness or numbness.  ____________________________________________   PHYSICAL EXAM:  VITAL SIGNS: ED Triage Vitals  Enc Vitals Group     BP 08/28/16 1859 117/72     Pulse Rate 08/28/16 1859 96     Resp 08/28/16 1859 18     Temp 08/28/16 1859 98.9 F (37.2 C)     Temp Source 08/28/16 1859 Oral     SpO2 08/28/16 1859 99 %     Weight 08/28/16 1859 61.2 kg (135 lb)     Height 08/28/16 1859 1.651 m (5' 5" )     Head Circumference --      Peak Flow --      Pain Score 08/28/16 1901 5     Pain Loc --      Pain Edu? --      Excl. in [place]? --     Constitutional: Alert and oriented. Well appearing and in no acute distress. Eyes: Conjunctivae are normal.  Head: Atraumatic. Mouth/Throat: Mucous membranes are moist.  Oropharynx non-erythematous. Neck: No stridor. Cardiovascular: Normal rate, regular rhythm. Good peripheral circulation. Grossly normal heart sounds. Respiratory: Normal respiratory effort.  No retractions. Lungs CTAB. Gastrointestinal: Soft and nontender. No distention.   Musculoskeletal: No lower extremity tenderness nor edema. No gross deformities of extremities. Neurologic:  Normal speech and language. No gross focal neurologic deficits are appreciated.  Skin:  Skin is warm, dry and intact. No rash noted. Psychiatric: Mood and affect are normal. Speech and behavior are normal.  ____________________________________________   LABS (all labs ordered are listed, but only abnormal results are displayed)  Labs Reviewed  COMPREHENSIVE METABOLIC PANEL - Abnormal; Notable for the following:       Result Value   Calcium 8.8 (*)    ALT 15 (*)    All other components within normal limits  CBC  TYPE AND SCREEN     Procedures   ____________________________________________   INITIAL IMPRESSION / ASSESSMENT AND PLAN  / ED COURSE  Pertinent labs & imaging results that were available during my care of the patient were reviewed by me and considered in my medical decision making (see chart for details).  31 year old male with known Crohn's disease presents to the emergency department about her blood per rectum 2-3 weeks. Hemoglobin stable at 13. Patient with 1 out of 10 abdominal pain at this time no reproducible pain on exam and a such imaging of the abdomen not performed.      ____________________________________________  FINAL CLINICAL IMPRESSION(S) / ED DIAGNOSES  Final diagnoses:  Exacerbation of Crohn's disease without complication ([place])     MEDICATIONS GIVEN DURING THIS VISIT:  Medications  methylPREDNISolone sodium succinate (SOLU-MEDROL) 125 mg/2 mL injection 125 mg (125 mg Intravenous Given 08/29/16 0022)  morphine 4 MG/ML injection 4 mg (4 mg Intravenous Given 08/29/16 0022)  ondansetron (ZOFRAN) injection 4 mg (4 mg Intravenous Given 08/29/16 0022)  NEW OUTPATIENT MEDICATIONS STARTED DURING THIS VISIT:  Discharge Medication List as of 08/29/2016 12:59 AM    START taking these medications   Details  dicyclomine (BENTYL) 20 MG tablet Take 1 tablet (20 mg total) by mouth 3 (three) times daily as needed for spasms., Starting Sun 08/29/2016, Until Mon 08/29/2017, Print    predniSONE (DELTASONE) 20 MG tablet Take 3 tablets (60 mg total) by mouth daily., Starting Sun 08/29/2016, Until Fri 09/03/2016, Print        Discharge Medication List as of 08/29/2016 12:59 AM      Discharge Medication List as of 08/29/2016 12:59 AM       Note:  This document was prepared using Dragon voice recognition software and may include unintentional dictation errors.    Gregor Hams, MD 08/29/16 [phone]

## 2016-09-09 ENCOUNTER — Other Ambulatory Visit: Payer: Self-pay

## 2016-09-09 ENCOUNTER — Other Ambulatory Visit
Admission: RE | Admit: 2016-09-09 | Discharge: 2016-09-09 | Disposition: A | Discharge status: Home / Self Care | Payer: BC Managed Care – PPO | Source: Ambulatory Visit | Attending: Gastroenterology | Admitting: Gastroenterology

## 2016-09-09 ENCOUNTER — Ambulatory Visit (INDEPENDENT_AMBULATORY_CARE_PROVIDER_SITE_OTHER): Payer: BC Managed Care – PPO | Admitting: Gastroenterology

## 2016-09-09 ENCOUNTER — Encounter: Payer: Self-pay | Admitting: Gastroenterology

## 2016-09-09 ENCOUNTER — Telehealth: Payer: Self-pay

## 2016-09-09 VITALS — BP 111/69 | HR 88 | Temp 97.7°F | Ht 65.0 in | Wt 138.6 lb

## 2016-09-09 DIAGNOSIS — K50114 Crohn's disease of large intestine with abscess: Secondary | ICD-10-CM

## 2016-09-09 DIAGNOSIS — K50119 Crohn's disease of large intestine with unspecified complications: Secondary | ICD-10-CM

## 2016-09-09 DIAGNOSIS — K50118 Crohn's disease of large intestine with other complication: Secondary | ICD-10-CM

## 2016-09-09 DIAGNOSIS — K5010 Crohn's disease of large intestine without complications: Secondary | ICD-10-CM

## 2016-09-09 DIAGNOSIS — Z7952 Long term (current) use of systemic steroids: Secondary | ICD-10-CM

## 2016-09-09 LAB — URINALYSIS, COMPLETE (UACMP) WITH MICROSCOPIC
Bacteria, UA: NONE SEEN
Bilirubin Urine: NEGATIVE
Glucose, UA: NEGATIVE mg/dL
Hgb urine dipstick: NEGATIVE
KETONES UR: NEGATIVE mg/dL
Leukocytes, UA: NEGATIVE
Nitrite: NEGATIVE
PH: 5 (ref 5.0–8.0)
Protein, ur: NEGATIVE mg/dL
SPECIFIC GRAVITY, URINE: 1.019 (ref 1.005–1.030)
SQUAMOUS EPITHELIAL / LPF: NONE SEEN

## 2016-09-09 LAB — C-REACTIVE PROTEIN: CRP: 1.7 mg/dL — AB (ref <1.0)

## 2016-09-09 NOTE — Progress Notes (Signed)
   MD, MRCP(U.K) [address]  [address]  [place], [place] [address]  Main: [phone]  Fax: [phone]   Primary Care Physician: Anderson, Marshall W, MD  Primary Gastroenterologist:  Dr.     Chief Complaint  Patient presents with  . fistula infection  . Urinary Frequency    HPI: Leroy Hernandez is a 31 y.o. male  He is here today for a follow up visit. He is a patient of Dr Wohl who is on vacation today . CT abdomen in 06/2016 shows a fistula between the rectum and skin , with possible subcutaneous collection/abscess. 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 He has had a right hemicolectomy with ileocolonic anastomosis in the past . He says he diagnosed in 2007 says was diagnosed in the "large and small inetstine" , has had "two surgeries in the past " . Never smoked. Denies prior NSAID use.   Labs 08/2016 - HB 13.6 , WCC 4.5 ,CMP normal . No recent CRP. He was seen at the ER in 07/2016 and most recently 08/28/16 with rectal bleeding . SBFT in 2010 suggests possible iletis and duodenal narrowing. He has had perinal abscesses drained in the past . Review of records suggest he used to be seen by Dr Elliot in the past and changed to Dr Wohl in 2017 .   He says he was on Humira which was stopped less than a year. Then was changed to Cimzia, since he says still has been having issues but doing a lot better than Humira.   Presently has diarrhea 7-10 times a day , He was "bleeding a lot " a few weeks , was given some prednisone and stopped. He has been on 5-6 cycles of steroids this year alone . He recalls he has not had a MRI. He is on Imuran "3 pills once a day " for some years. Does recall having had vaccines for Hep A/B in the past . Last TB test was was a skin test .  Presently has diarrhea, says has been having drainage from his rectum . Some blood in the past presently none. No fevers. He also says he is passing urine more often , no dysuria.   Last was on  antibiotics 2-3 months back .        Current Outpatient Prescriptions  Medication Sig Dispense Refill  . azaTHIOprine (IMURAN) 50 MG tablet Take three tablets by mouth daily 90 tablet 11  . busPIRone (BUSPAR) 5 MG tablet Take 1 tablet (5 mg total) by mouth 2 (two) times daily. 60 tablet 0  . escitalopram (LEXAPRO) 10 MG tablet Take 10 mg by mouth daily.     . benzonatate (TESSALON) 200 MG capsule benzonatate 200 mg capsule    . CIMZIA PREFILLED 2 X 200 MG/ML KIT Inject 1 Dose into the muscle every 14 (fourteen) days.     . dicyclomine (BENTYL) 20 MG tablet Take 1 tablet (20 mg total) by mouth 3 (three) times daily as needed for spasms. (Patient not taking: Reported on 09/09/2016) 30 tablet 0  . LORazepam (ATIVAN) 0.5 MG tablet lorazepam 0.5 mg tablet    . ondansetron (ZOFRAN-ODT) 8 MG disintegrating tablet ondansetron 8 mg disintegrating tablet    . OxyCODONE HCl, Abuse Deter, (OXAYDO) 5 MG TABA oxycodone 5 mg tablet    . oxyCODONE-acetaminophen (ROXICET) 5-325 MG tablet Take 1 tablet by mouth every 6 (six) hours as needed. (Patient not taking: Reported on 09/09/2016) 20 tablet 0  . PROAIR   HFA 108 (90 Base) MCG/ACT inhaler Inhale 2 puffs into the lungs every 4 (four) hours as needed.    . traMADol (ULTRAM) 50 MG tablet Take 1 tablet (50 mg total) by mouth every 6 (six) hours as needed. (Patient not taking: Reported on 06/22/2016) 12 tablet 0   No current facility-administered medications for this visit.     Allergies as of 09/09/2016  . (No Known Allergies)    ROS:  General: Negative for anorexia, weight loss, fever, chills, fatigue, weakness. ENT: Negative for hoarseness, difficulty swallowing , nasal congestion. CV: Negative for chest pain, angina, palpitations, dyspnea on exertion, peripheral edema.  Respiratory: Negative for dyspnea at rest, dyspnea on exertion, cough, sputum, wheezing.  GI: See history of present illness. GU:  Negative for dysuria, hematuria, urinary incontinence,  urinary frequency, nocturnal urination.  Endo: Negative for unusual weight change.    Physical Examination:   BP 111/69 (BP Location: Right Arm, Patient Position: Sitting, Cuff Size: Normal)   Pulse 88   Temp 97.7 F (36.5 C) (Oral)   Ht 5' 5" (1.651 m)   Wt 138 lb 9.6 oz (62.9 kg)   BMI 23.06 kg/m   General: Well-nourished, well-developed in no acute distress.  Eyes: No icterus. Conjunctivae pink. Mouth: Oropharyngeal mucosa moist and pink , no lesions erythema or exudate. Lungs: Clear to auscultation bilaterally. Non-labored. Heart: Regular rate and rhythm, no murmurs rubs or gallops.  Abdomen:central verticle scar,  Bowel sounds are normal, nontender, nondistended, no hepatosplenomegaly or masses, no abdominal bruits or hernia , no rebound or guarding.   Extremities: No lower extremity edema. No clubbing or deformities. Neuro: Alert and oriented x 3.  Grossly intact. Skin: Warm and dry, no jaundice.   Psych: Alert and cooperative, normal mood and affect.   Imaging Studies: No results found.  Assessment and Plan:   Leroy Hernandez is a 31 y.o. y/o male who sees Dr Allen Norris has come in today to see me for a follow uo. 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 Clinically his history suggests he has never ben either in endoscopic or clinical remission . It is very likely he has failed Cimzia. I will plan to obtain baseline large and small bowel imaging , r/o any abscesses , obtain baseline labs and if his imaging , labs suggest active inflammation then will need to change therapy to either Stelara or Entyvio I did briefly mention that rather than Imuran may need to consider Methotrexate if using combination therapy due to his age but can be decided at a later point. Health maintenance too will need to be addressed over the next few visits.  He has had multiple  rounds of steroids and will need baseline screening for osteoporosis. He has some diarrhea presently - will r/o infection , celiac disease. May be due to SIBO or IBS .   Plan  1. MR enterogram 2. CRP,Fecal lactoferin , Hep C ab, HIV, Hep A IGG, Hep B serology , TPMT enzyme activity. Vitamin D levels. TB quantiferon , celiac serology ,CBC,cmp   3. Urine analysis for frequency  4. Stool for C diff and PCR 5. Thiopurine metabolites 6. DEXA bone scan to screen for osteoporosis.  7. Commence process to obtain Stelara   F/u in 2 weeks with Dr [name]   Dr Jonathon Bellows  MD,MRCP [facility])

## 2016-09-09 NOTE — Telephone Encounter (Signed)
LVM for patient callback for MRE and Dexa appointment.   Scheduled for [facility] on 8/14 @ 830am. NPO 4 hours prior.

## 2016-09-10 LAB — HEPATITIS C ANTIBODY

## 2016-09-10 LAB — HEPATITIS B SURFACE ANTIGEN: HEP B S AG: NEGATIVE

## 2016-09-10 LAB — HEPATITIS A ANTIBODY, TOTAL: Hep A Total Ab: NEGATIVE

## 2016-09-10 LAB — HEPATITIS B CORE ANTIBODY, IGM: HEP B C IGM: NEGATIVE

## 2016-09-10 LAB — HIV ANTIBODY (ROUTINE TESTING W REFLEX): HIV Screen 4th Generation wRfx: NONREACTIVE

## 2016-09-10 LAB — HEPATITIS B E ANTIGEN: HEP B E AG: NEGATIVE

## 2016-09-10 LAB — HEPATITIS B SURFACE ANTIBODY,QUALITATIVE: HEP B S AB: REACTIVE

## 2016-09-10 LAB — HEPATITIS B CORE ANTIBODY, TOTAL: Hep B Core Total Ab: NEGATIVE

## 2016-09-11 LAB — CELIAC DISEASE PANEL
Endomysial Ab, IgA: NEGATIVE
IGA: 242 mg/dL (ref 90–386)
Tissue Transglutaminase Ab, IgA: <2 U/mL (ref 0–3)

## 2016-09-12 LAB — VITAMIN D 1,25 DIHYDROXY
VITAMIN D 1, 25 (OH) TOTAL: 63 pg/mL
VITAMIN D3 1, 25 (OH): 44 pg/mL
Vitamin D2 1, 25 (OH)2: 19 pg/mL

## 2016-09-14 LAB — HEPATITIS B E ANTIBODY: Hep B E Ab: NEGATIVE

## 2016-09-16 LAB — MISC LABCORP TEST (SEND OUT)
Labcorp test code: 503800
Labcorp test code: 510750

## 2016-09-21 ENCOUNTER — Ambulatory Visit
Admission: RE | Admit: 2016-09-21 | Discharge: 2016-09-21 | Disposition: A | Discharge status: Home / Self Care | Payer: BC Managed Care – PPO | Source: Ambulatory Visit | Attending: Gastroenterology | Admitting: Gastroenterology

## 2016-09-21 DIAGNOSIS — Z9049 Acquired absence of other specified parts of digestive tract: Secondary | ICD-10-CM | POA: Insufficient documentation

## 2016-09-21 DIAGNOSIS — K50114 Crohn's disease of large intestine with abscess: Secondary | ICD-10-CM

## 2016-09-21 MED ORDER — GADOBENATE DIMEGLUMINE 529 MG/ML IV SOLN
15.0000 mL | Freq: Once | INTRAVENOUS | Status: AC | PRN
  Administered 2016-09-21: 12 mL via INTRAVENOUS

## 2016-09-29 ENCOUNTER — Ambulatory Visit
Admission: RE | Admit: 2016-09-29 | Discharge: 2016-09-29 | Disposition: A | Discharge status: Home / Self Care | Payer: BC Managed Care – PPO | Source: Ambulatory Visit | Attending: Gastroenterology | Admitting: Gastroenterology

## 2016-09-29 DIAGNOSIS — Z7952 Long term (current) use of systemic steroids: Secondary | ICD-10-CM | POA: Insufficient documentation

## 2016-09-29 DIAGNOSIS — K50114 Crohn's disease of large intestine with abscess: Secondary | ICD-10-CM | POA: Diagnosis present

## 2016-10-03 ENCOUNTER — Emergency Department
Admission: EM | Admit: 2016-10-03 | Discharge: 2016-10-03 | Disposition: A | Discharge status: Home / Self Care | Payer: BC Managed Care – PPO | Attending: Emergency Medicine | Admitting: Emergency Medicine

## 2016-10-03 DIAGNOSIS — Z87891 Personal history of nicotine dependence: Secondary | ICD-10-CM | POA: Diagnosis not present

## 2016-10-03 DIAGNOSIS — R1084 Generalized abdominal pain: Secondary | ICD-10-CM | POA: Insufficient documentation

## 2016-10-03 DIAGNOSIS — K625 Hemorrhage of anus and rectum: Secondary | ICD-10-CM | POA: Diagnosis present

## 2016-10-03 DIAGNOSIS — Z79899 Other long term (current) drug therapy: Secondary | ICD-10-CM | POA: Insufficient documentation

## 2016-10-03 DIAGNOSIS — R109 Unspecified abdominal pain: Secondary | ICD-10-CM

## 2016-10-03 DIAGNOSIS — K922 Gastrointestinal hemorrhage, unspecified: Secondary | ICD-10-CM

## 2016-10-03 DIAGNOSIS — J4520 Mild intermittent asthma, uncomplicated: Secondary | ICD-10-CM | POA: Diagnosis not present

## 2016-10-03 LAB — COMPREHENSIVE METABOLIC PANEL
ALK PHOS: 91 U/L (ref 38–126)
ALT: 13 U/L — ABNORMAL LOW (ref 17–63)
ANION GAP: 8 (ref 5–15)
AST: 20 U/L (ref 15–41)
Albumin: 3.9 g/dL (ref 3.5–5.0)
BILIRUBIN TOTAL: 0.7 mg/dL (ref 0.3–1.2)
BUN: 9 mg/dL (ref 6–20)
CALCIUM: 9.1 mg/dL (ref 8.9–10.3)
CO2: 27 mmol/L (ref 22–32)
Chloride: 106 mmol/L (ref 101–111)
Creatinine, Ser: 1.01 mg/dL (ref 0.61–1.24)
GFR calc Af Amer: >60 mL/min (ref >60)
Glucose, Bld: 148 mg/dL — ABNORMAL HIGH (ref 65–99)
POTASSIUM: 3 mmol/L — AB (ref 3.5–5.1)
Sodium: 141 mmol/L (ref 135–145)
TOTAL PROTEIN: 7.3 g/dL (ref 6.5–8.1)

## 2016-10-03 LAB — URINALYSIS, COMPLETE (UACMP) WITH MICROSCOPIC
BACTERIA UA: NONE SEEN
BILIRUBIN URINE: NEGATIVE
GLUCOSE, UA: NEGATIVE mg/dL
HGB URINE DIPSTICK: NEGATIVE
Ketones, ur: NEGATIVE mg/dL
LEUKOCYTES UA: NEGATIVE
NITRITE: NEGATIVE
Protein, ur: NEGATIVE mg/dL
RBC / HPF: NONE SEEN RBC/hpf (ref 0–5)
SPECIFIC GRAVITY, URINE: 1.028 (ref 1.005–1.030)
pH: 5 (ref 5.0–8.0)

## 2016-10-03 LAB — CBC
HEMATOCRIT: 38.9 % — AB (ref 40.0–52.0)
HEMOGLOBIN: 13.2 g/dL (ref 13.0–18.0)
MCH: 27.8 pg (ref 26.0–34.0)
MCHC: 34 g/dL (ref 32.0–36.0)
MCV: 81.8 fL (ref 80.0–100.0)
Platelets: 301 10*3/uL (ref 150–440)
RBC: 4.75 MIL/uL (ref 4.40–5.90)
RDW: 14.2 % (ref 11.5–14.5)
WBC: 5.8 10*3/uL (ref 3.8–10.6)

## 2016-10-03 MED ORDER — DICYCLOMINE HCL 20 MG PO TABS: 20.0000 mg | ORAL_TABLET | Freq: Three times a day (TID) | ORAL | 0 refills | Status: DC | PRN

## 2016-10-03 MED ORDER — METHYLPREDNISOLONE SODIUM SUCC 125 MG IJ SOLR
125.0000 mg | Freq: Once | INTRAMUSCULAR | Status: AC
  Administered 2016-10-03: 125 mg via INTRAVENOUS
  Filled 2016-10-03: qty 2

## 2016-10-03 MED ORDER — DICYCLOMINE HCL 10 MG/ML IM SOLN
20.0000 mg | Freq: Once | INTRAMUSCULAR | Status: AC
  Administered 2016-10-03: 20 mg via INTRAMUSCULAR
  Filled 2016-10-03: qty 2

## 2016-10-03 MED ORDER — POTASSIUM CHLORIDE CRYS ER 20 MEQ PO TBCR
40.0000 meq | EXTENDED_RELEASE_TABLET | Freq: Once | ORAL | Status: AC
  Administered 2016-10-03: 40 meq via ORAL
  Filled 2016-10-03: qty 2

## 2016-10-03 MED ORDER — PREDNISONE 20 MG PO TABS
40.0000 mg | ORAL_TABLET | Freq: Every day | ORAL | 0 refills | Status: DC
Start: 2016-10-03 — End: 2016-12-13

## 2016-10-03 NOTE — Discharge Instructions (Signed)
Please seek medical attention for any high fevers, chest pain, shortness of breath, change in behavior, persistent vomiting, bloody stool or any other new or concerning symptoms.  

## 2016-10-03 NOTE — ED Triage Notes (Signed)
Pt states history of chron's disease. Pt complains of left flank pain, rectal bleeding and anal pain. Pt states has been increasing over last 2 weeks.

## 2016-10-03 NOTE — ED Provider Notes (Signed)
[facility] Emergency Department Provider Note   ____________________________________________   I have reviewed the triage vital signs and the nursing notes.   HISTORY  Chief Complaint Rectal Bleeding   History limited by: Not Limited   HPI Leroy Hernandez is a 31 y.o. male who presents to the emergency department today because of concerns for rectal bleeding and abdominal pain. The symptoms started 2 weeks ago. Patient does have a history of Crohn's disease. States pain is located on the left side. It is gradually gotten worse. He has not tried anything for the pain. He was hoping to make it until tomorrow when he can speak to his Crohn's doctor. He states he has been off steroids for the past few weeks. He does take injections for his Crohn's. Denies any fevers. No nausea or vomiting.   Past Medical History:  Diagnosis Date  . Anxiety   . Asthma   . Crohn's disease ([place])   . Rectal fistula     Patient Active Problem List   Diagnosis Date Noted  . Generalized abdominal pain   . Perirectal fistula   . Sepsis ([place]) 06/22/2016  . Fracture of metacarpal bone 06/09/2016  . Calculus of gallbladder without cholecystitis without obstruction 12/25/2015  . Gallbladder polyp 12/25/2015  . Blood in stool   . Intestinal bypass or anastomosis status   . Crohn's disease of both small and large intestine with rectal bleeding ([place])   . Crohn's disease ([place]) 08/22/2015  . Perirectal abscess 07/04/2015  . Mild episode of recurrent major depressive disorder ([place]) 04/02/2015  . Asthma, mild intermittent 04/02/2015  . Crohn's disease of colon ([place]) 02/06/2015    Past Surgical History:  Procedure Laterality Date  . COLON RESECTION    . COLON SURGERY    . COLONOSCOPY WITH PROPOFOL N/A 04/03/2015   Procedure: COLONOSCOPY WITH PROPOFOL;  Surgeon: Hulen Luster, MD;  Location: [facility] ENDOSCOPY;  Service: Endoscopy;  Laterality: N/A;  . COLONOSCOPY WITH PROPOFOL N/A  12/22/2015   Procedure: COLONOSCOPY WITH PROPOFOL;  Surgeon: [name], MD;  Location: [facility];  Service: Endoscopy;  Laterality: N/A;  . rectal fistula surgery    . WRIST SURGERY      Prior to Admission medications   Medication Sig Start Date End Date Taking? Authorizing Provider  azaTHIOprine (IMURAN) 50 MG tablet Take three tablets by mouth daily 08/22/15   [name], MD  benzonatate (TESSALON) 200 MG capsule benzonatate 200 mg capsule    [provider]  busPIRone (BUSPAR) 5 MG tablet Take 1 tablet (5 mg total) by mouth 2 (two) times daily. 05/27/16   Hinda Kehr, MD  CIMZIA PREFILLED 2 X 200 MG/ML KIT Inject 1 Dose into the muscle every 14 (fourteen) days.  05/25/16   [provider]  dicyclomine (BENTYL) 20 MG tablet Take 1 tablet (20 mg total) by mouth 3 (three) times daily as needed for spasms. Patient not taking: Reported on 09/09/2016 08/29/16 08/29/17  Gregor Hams, MD  escitalopram (LEXAPRO) 10 MG tablet Take 10 mg by mouth daily.  04/02/15   [provider]  LORazepam (ATIVAN) 0.5 MG tablet lorazepam 0.5 mg tablet    [provider]  ondansetron (ZOFRAN-ODT) 8 MG disintegrating tablet ondansetron 8 mg disintegrating tablet    [provider]  OxyCODONE HCl, Abuse Deter, (OXAYDO) 5 MG TABA oxycodone 5 mg tablet    [provider]  oxyCODONE-acetaminophen (ROXICET) 5-325 MG tablet Take 1 tablet by mouth every 6 (  six) hours as needed. Patient not taking: Reported on 09/09/2016 07/31/16 07/31/17  Rudene Re, MD  [facility] HFA 108 [phone] Base) MCG/ACT inhaler Inhale 2 puffs into the lungs every 4 (four) hours as needed. 02/27/16   [provider]  traMADol (ULTRAM) 50 MG tablet Take 1 tablet (50 mg total) by mouth every 6 (six) hours as needed. Patient not taking: Reported on 06/22/2016 12/24/15   Loney Hering, MD    Allergies Patient has no known allergies.  Family History  Problem Relation Age of  Onset  . Diabetes Paternal Grandfather   . Heart disease Paternal Grandfather     Social History Social History  Substance Use Topics  . Smoking status: Never Smoker  . Smokeless tobacco: Former Systems developer  . Alcohol use 1.2 oz/week    2 Cans of beer per week    Review of Systems Constitutional: No fever/chills Eyes: No visual changes. ENT: No sore throat. Cardiovascular: Denies chest pain. Respiratory: Denies shortness of breath. Gastrointestinal: Positive for abdominal pain and rectal bleeding.  Genitourinary: Negative for dysuria. Musculoskeletal: Negative for back pain. Skin: Negative for rash. Neurological: Negative for headaches, focal weakness or numbness.  ____________________________________________   PHYSICAL EXAM:  VITAL SIGNS: ED Triage Vitals  Enc Vitals Group     BP 10/03/16 2038 125/74     Pulse Rate 10/03/16 2038 71     Resp 10/03/16 2038 16     Temp 10/03/16 2038 98.1 F (36.7 C)     Temp Source 10/03/16 2038 Oral     SpO2 10/03/16 2038 100 %     Weight 10/03/16 2039 135 lb (61.2 kg)     Height 10/03/16 2039 5' 5"  (1.651 m)     Head Circumference --      Peak Flow --      Pain Score 10/03/16 2038 7   Constitutional: Alert and oriented. Well appearing and in no distress. Eyes: Conjunctivae are normal.  ENT   Head: Normocephalic and atraumatic.   Nose: No congestion/rhinnorhea.   Mouth/Throat: Mucous membranes are moist.   Neck: No stridor. Hematological/Lymphatic/Immunilogical: No cervical lymphadenopathy. Cardiovascular: Normal rate, regular rhythm.  No murmurs, rubs, or gallops.  Respiratory: Normal respiratory effort without tachypnea nor retractions. Breath sounds are clear and equal bilaterally. No wheezes/rales/rhonchi. Gastrointestinal: Soft and somewhat diffusely tender to palpation. No rebound. No guarding.  Genitourinary: Deferred Musculoskeletal: Normal range of motion in all extremities. No lower extremity  edema. Neurologic:  Normal speech and language. No gross focal neurologic deficits are appreciated.  Skin:  Skin is warm, dry and intact. No rash noted. Psychiatric: Mood and affect are normal. Speech and behavior are normal. Patient exhibits appropriate insight and judgment.  ____________________________________________    LABS (pertinent positives/negatives)  Labs Reviewed  COMPREHENSIVE METABOLIC PANEL - Abnormal; Notable for the following:       Result Value   Potassium 3.0 (*)    Glucose, Bld 148 (*)    ALT 13 (*)    All other components within normal limits  CBC - Abnormal; Notable for the following:    HCT 38.9 (*)    All other components within normal limits  URINALYSIS, COMPLETE (UACMP) WITH MICROSCOPIC  POC OCCULT BLOOD, ED     ____________________________________________   EKG  None  ____________________________________________    RADIOLOGY  None  ____________________________________________   PROCEDURES  Procedures  ____________________________________________   INITIAL IMPRESSION / ASSESSMENT AND PLAN / ED COURSE  Pertinent labs & imaging results that were available during  my care of the patient were reviewed by me and considered in my medical decision making (see chart for details).  Patient was admitted to the emergency department today because of concerns for abdominal pain and rectal bleeding. Patient does have a history Crohn's disease. No leukocytosis or fever. No rebound, guarding and abdomen is soft. Do not feel any emergent imaging is necessary. Patient did feel relief after medication. Will discharge with steroids and bentyl. Discussed return precautions.   ____________________________________________   FINAL CLINICAL IMPRESSION(S) / ED DIAGNOSES  Final diagnoses:  Abdominal pain, unspecified abdominal location  Gastrointestinal hemorrhage, unspecified gastrointestinal hemorrhage type     Note: This dictation was prepared with  Dragon dictation. Any transcriptional errors that result from this process are unintentional     Nance Pear, MD 10/03/16 2321

## 2016-10-04 NOTE — ED Notes (Signed)
Pt reports hx of crohn's disease. Pt reports having bloody diarrhea for about 2 weeks. Pt denies fever.

## 2016-10-05 ENCOUNTER — Other Ambulatory Visit: Payer: Self-pay | Admitting: Gastroenterology

## 2016-10-05 ENCOUNTER — Encounter: Payer: Self-pay | Admitting: Gastroenterology

## 2016-10-05 ENCOUNTER — Ambulatory Visit (INDEPENDENT_AMBULATORY_CARE_PROVIDER_SITE_OTHER): Payer: BC Managed Care – PPO | Admitting: Gastroenterology

## 2016-10-05 VITALS — BP 111/70 | HR 86 | Ht 65.0 in | Wt 136.2 lb

## 2016-10-05 DIAGNOSIS — K50813 Crohn's disease of both small and large intestine with fistula: Secondary | ICD-10-CM

## 2016-10-05 MED ORDER — MESALAMINE 4 G RE ENEM: 4.0000 g | ENEMA | Freq: Two times a day (BID) | RECTAL | 0 refills | Status: DC

## 2016-10-05 MED ORDER — HYOSCYAMINE SULFATE 0.125 MG PO TABS: 0.1250 mg | ORAL_TABLET | ORAL | 0 refills | Status: DC | PRN

## 2016-10-05 MED ORDER — CIPROFLOXACIN HCL 500 MG PO TABS: 500.0000 mg | ORAL_TABLET | Freq: Two times a day (BID) | ORAL | 1 refills | Status: AC

## 2016-10-05 MED ORDER — METRONIDAZOLE 500 MG PO TABS: 500.0000 mg | ORAL_TABLET | Freq: Two times a day (BID) | ORAL | 1 refills | Status: AC

## 2016-10-05 NOTE — Patient Instructions (Addendum)
1. Start cipro + flagyl two times daily for 4weeks 2. Start rowasa enema two times daily for 2weeks then once daily at bedtime 3. Start hyoscyamine every 4 hrs as needed for abdominal pain 4. We will start budesonide in 2weeks if above don't work 5. Recommend Twinrix, prevnar followed by pneumovax at your PCP's office  Please call our office to speak with my nurse Driscilla Grammes at [phone] during business hours from 8am to 4pm if you have any questions/concerns. During after hours, you will be redirected to on call GI physician. For any emergency please call 911 or go the nearest emergency room.    [name], MD [address]  [place]  [place], [place] [address]  Main: [phone]  Fax: [phone]

## 2016-10-05 NOTE — Progress Notes (Signed)
we'll  [name], MD [address]  [place]  [place], [place] [address]  Main: [phone]  Fax: [phone]    Gastroenterology Consultation  Referring Provider:     [name], MD Primary Care Physician:  [name], MD Primary Gastroenterologist:  Dr. [name] Reason for Consultation:     Crohn's disease        HPI:   Leroy Hernandez is a 31 y.o. y/o male referred by Dr. Ouida Sills, [name], MD  for consultation & management of Crohn's disease. He has history of ileocolonic Crohn's diagnosed in 2007, detailed history described below is referred by [name] to establish care. He is currently on cymzia 400 mg every 2 weeks and Imuran 150 mg daily. Currently, he is experiencing nonbloody diarrhea up to 10 times a day associated with urgency and perianal purulent discharge from the fistula. He also reports tenderness in the right perianal area. He reports left-sided abdominal pain associated with nausea but no vomiting. He denies bloating, blood in the stools. He went to [facility] ER 2 days ago due to severe diarrhea and head is given prescription for oral prednisone but he did not filling the prescription yet. He denies fever, chills, loss of appetite, weight loss. He has been adherent with his medications.   Crohn's disease classification:  Age: 16 to 28 Location: ileocolonic  Behavior: stricturing and penetrating  Perianal: Yes  IBD diagnosis:2007  Disease course: She diagnosed in 2007, was experiencing right lower quadrant pain and bloating, was on oral mesalamine, several courses of prednisone, underwent first surgery in 2011 of transverse colon resection at [facility]. He continued to have diarrhea and required repeated courses of prednisone, established care at [facility] and underwent colonoscopy which revealed severe ileocolonic disease, underwent ileocolonic resection in 11/2011. He reports being  started on Humira and Imuran since then and was doing well for some time. He was also taking marijuana and it significantly helped with his symptoms. Subsequently, he had frequent flareups, several hospitalizations and ER visits almost every month secondary to perianal fistula, abscess and bloody diarrhea. He was receiving short courses of antibiotics. He had I&D of right perirectal abscesses in May/2017. He does not recall having a seton placement. His colonoscopy in 03/2015 revealed mild active colitis and anastomotic stricture that was dilated with balloon to 12 mm. He underwent another colonoscopy in 12/2015 which revealed inflammation in the neoterminal ileum as well as colon and was found to have moderately active ileocolonic disease. He was then switched to [place] in 05/2016 and stayed on Imuran 150 mg daily. He was feeling better overall but continued to have active perianal disease resulting in urgency and bloody stools. CT A/P from 06/2016 revealed right perirectal fistula with developing fluid collection concerning for abscess. MRE from 09/2016 revealed no evidence of active disease, however the fistula could not be visualized. His CRP improved from 10 in 06/2016 -1.7 in 09/2016.  Extra intestinal manifestations: Imaging revealed chronic sacroiliitis. He denies skin, eye manifestations  IBD surgical history: 2011 : Intestinal resection, transverse colon Diagnosis:  TRANSVERSE COLON AND PROXIMAL TRANSVERSE COLON RESECTION:  SEGMENTS OF BOWEL WITH MODERATE TO FOCALLY SEVERE CHRONIC ACTIVE COLITIS.  EXTENSIVE TRANSMURAL INFLAMMATION.  CRYTPITIS AND CRYPT ABSCESSES ARE NOTED.  THE RESECTION MARGINS APPEAR CLEAR.  THERE IS NO EVIDENCE OF DYSPLASIA OR MALIGNANCY.  NINETEEN BENIGN LYMPH NODES. (0/19)  12/01/2011 ileocolonic resection at [facility] by Dr. Launa Flight - 9 cm TI,  5.5 cm long large bowel Diagnosis:  A:Ileum and colon, resection  -Segment of ileum and colon with moderate to severely active  chronic  enteritis, mildly active chronic colitis, and intact anastomosis at distal end  of specimen  -Surgical margins are viable; mildly active chronic colitis is present at  distal margin  -Apparent stricture near ileocecal valve  -Inflammatory polyp near ileocecal valve  -Fibrous obliteration of appendiceal lumen  -Six unremarkable lymph nodes examined microscopically  -No granulomas, viral cytopathic effect, or dysplasia identified   07/04/2015-incision and drainage of right perirectal abscess Imaging:  MRE-09/21/2016 IMPRESSION: 1. Stable surgical changes from prior colon resection and ileal colonic anastomosis. No findings for active Crohn's disease. 2. No acute abdominal/pelvic findings, mass lesions or adenopathy.  CT A/P 06/22/2016 IMPRESSION: 1. The fistula between the right lateral aspect of the rectum and the skin is again identified. The portion of the fistula immediately beneath the skin is more prominent in caliber in the interval with central decreased attenuation worrisome for a developing fluid collection in the distal fistula immediately beneath the cutaneous surface. A developing abscess cannot be excluded. Recommend clinical correlation. 2. Fluid throughout the remaining colon consistent with history of diarrhea. No colonic or small bowel inflammation identified. The rectum is poorly evaluated due to lack of distention. 3. 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  SBFT none  Procedures: Colonoscopy 09/2009 Diagnosis:  TRANSVERSE COLON MUCOSA COLD BIOPSY:  - MODERATE CHRONIC ACTIVE COLITIS, CONSISTENT WITH PATIENTS  HISTORY OF CROHNS DISEASE.  - NEGATIVE FOR DYSPLASIA AND MALIGNANCY.   Colonoscopy 06/2010 Diagnosis:  ULCERATED RIGHT SIDED COLON BIOPSY:  - MILD CHRONIC ACTIVE COLITIS, COMPATIBLE WITH PATIENTS KNOWN  HISTORY OF CROHNS DISEASE.  - NEGATIVE FOR DYSPLASIA AND MALIGNANCY.    Colonoscopies 07/2012 Diagnosis:  ANASTOMOTIC STRICTURE COLD BIOPSY:  - MILD CHRONIC ACTIVE COLITIS, COMPATIBLE WITH PATIENTS KNOWN  HISTORY OF CROHNS DISEASE.  - NEGATIVE FOR DYSPLASIA AND MALIGNANCY.   Colonoscopies 07/2013 Diagnosis:  ILEUM BIOPSY:  - SMALL BOWEL MUCOSA WITH PRESERVED VILLOUS ARCHITECTURE.  - NEGATIVE FOR INTRAEPITHELIAL LYMPHOCYTOSIS, DYSPLASIA AND  MALIGNANCY.   Colonoscopy 03/2015 Multiple ulcers in the sigmoid colon, no bleeding was present biopsies were taken, tight stricture at the ileocolonic anastomosis unable to get through scope, dilated with 12 mm TTS balloon. The terminal ileum appeared normal DIAGNOSIS:  A. SIGMOID COLON; COLD BIOPSY:  - MILD CHRONIC ACTIVE COLITIS.  - NEGATIVE FOR DYSPLASIA AND MALIGNANCY.   Colonoscopy 12/2015 There was evidence of prior end-to-end ileocolonic anastomosis in the transverse colon. This was characterized by mild stenosis. The anastomosis was traversed. Scattered inflammation, moderate in severity and characterized by shallow ulcerations was found in the proximal ileum. Biopsies were taken with the cold forceps for histology. Inflammation characterized by shallow ulcerations were found no sites were spared. This was moderate in severity. Biopsies were taken. Perianal fistula found on perianal exam. Pathology: Small intestine-severe chronic active ileitis with ulceration. 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 Cimzia started in 05/28/2016 along with Imuran. Anti Integrins:  Ustekinumab: Tofactinib: Clinical trial:   Past Medical History:  Diagnosis Date  . Anxiety   . Asthma   . Crohn's disease ([place])   . Rectal fistula     Past Surgical History:  Procedure Laterality Date  . COLON RESECTION    . COLON SURGERY    . COLONOSCOPY WITH PROPOFOL N/A 04/03/2015   Procedure: COLONOSCOPY WITH PROPOFOL;  Surgeon: Hulen Luster, MD;  Location: [facility] ENDOSCOPY;  Service: Endoscopy;  Laterality: N/A;  . COLONOSCOPY WITH PROPOFOL N/A 12/22/2015   Procedure: COLONOSCOPY WITH PROPOFOL;  Surgeon: [name], MD;  Location: [facility];  Service: Endoscopy;  Laterality: N/A;  . rectal fistula surgery    . WRIST SURGERY      Prior to Admission medications   Medication Sig Start Date End Date Taking? Authorizing Provider  albuterol (PROVENTIL HFA) 108 (90 Base) MCG/ACT inhaler Inhale 1 Inhaler into the lungs daily.    [provider]  azaTHIOprine (IMURAN) 50 MG tablet Take three tablets by mouth daily 08/22/15   [name], MD  benzonatate (TESSALON) 200 MG capsule benzonatate 200 mg capsule    [provider]  busPIRone (BUSPAR) 5 MG tablet Take 1 tablet (5 mg total) by mouth 2 (two) times daily. 05/27/16   Hinda Kehr, MD  CIMZIA PREFILLED 2 X 200 MG/ML KIT Inject 1 Dose into the muscle every 14 (fourteen) days.  05/25/16   [provider]  dicyclomine (BENTYL) 20 MG tablet Take 1 tablet (20 mg total) by mouth 3 (three) times daily as needed for spasms. Patient not taking: Reported on 09/09/2016 08/29/16 08/29/17  Gregor Hams, MD  dicyclomine (BENTYL) 20 MG tablet Take 1 tablet (20 mg total) by mouth 3 (three) times daily as needed (abdominal pain).  10/03/16   Nance Pear, MD  escitalopram (LEXAPRO) 10 MG tablet Take 10 mg by mouth daily.  04/02/15   [provider]  LORazepam (ATIVAN) 0.5 MG tablet lorazepam 0.5 mg tablet    [provider]  ondansetron (ZOFRAN-ODT) 8 MG disintegrating tablet ondansetron 8 mg disintegrating tablet    [provider]  OxyCODONE HCl, Abuse Deter, (OXAYDO) 5 MG TABA oxycodone 5 mg tablet    [provider]  oxyCODONE-acetaminophen (ROXICET) 5-325 MG tablet Take 1 tablet by mouth every 6 (six) hours as needed. Patient not taking: Reported on 09/09/2016 07/31/16 07/31/17  Rudene Re, MD  predniSONE (DELTASONE) 20 MG tablet Take 2 tablets (40 mg total) by mouth daily. 10/03/16   Nance Pear, MD  PROAIR HFA 108 [phone] Base) MCG/ACT inhaler Inhale 2 puffs into the lungs every 4 (four) hours as needed. 02/27/16   [provider]  traMADol (ULTRAM) 50 MG tablet Take 1 tablet (50 mg total) by mouth every 6 (six) hours as needed. Patient not taking: Reported on 06/22/2016 12/24/15   Loney Hering, MD    Family History  Problem Relation Age of Onset  . Diabetes Paternal Grandfather   . Heart disease Paternal Grandfather      Social History  Substance Use Topics  . Smoking status: Never Smoker  . Smokeless tobacco: Former Systems developer  . Alcohol use 1.2 oz/week    2 Cans of beer per week    Allergies as of 10/05/2016  . (No Known Allergies)    Review of Systems:  All systems reviewed and negative except where noted in HPI.   Physical Exam:  There were no vitals taken for this visit. No LMP for male patient.  General:   Alert,  Well-developed, well-nourished, pleasant and cooperative in NAD Head:  Normocephalic and atraumatic. Eyes:  Sclera clear, no icterus.   Conjunctiva pink. Ears:  Normal auditory acuity. Nose:  No deformity, discharge, or lesions. Mouth:  No deformity or lesions,oropharynx pink & moist. Neck:  Supple; no masses or  thyromegaly. Lungs:  Respirations even and unlabored.  Clear throughout to auscultation.   No wheezes, crackles, or rhonchi. No acute distress. Heart:  Regular rate and rhythm; no murmurs, clicks, rubs, or gallops. Abdomen:  Normal bowel sounds.  No bruits.  Soft, non-tender and non-distended without masses, hepatosplenomegaly or hernias noted.  No guarding or rebound tenderness.  Midline vertical scar from prior surgery Rectal: Nor performed, perianal exam revealed fistula in the right perianal area, draining small amount of purulent material, no fluctuant abscess was palpated, mild tenderness noted at the fistula site. Msk:  Symmetrical without gross deformities. Good, equal movement & strength bilaterally. Pulses:  Normal pulses noted. Extremities:  No clubbing or edema.  No cyanosis. Neurologic:  Alert and oriented x3;  grossly normal neurologically. Skin:  Intact without significant lesions or rashes. No jaundice. Psych:  Alert and cooperative. Normal mood and affect.   Assessment and Plan:   Leroy Hernandez is a 31 y.o. y/o male with Ileocolonic Crohn's disease diagnosed in 2007, stricturing and penetrating with perianal fistula, previously on Humira and Imuran, recurrence of postoperative Crohn's, resulted in discontinuation of Humira. Switched to [place] in 05/2016 biweekly and continued Imuran 150 mg daily. He is currently having nonbloody diarrhea, urgency, mild abdominal pain and drainage from perianal fistula. Since the start of Cimzia, his luminal symptoms have overall improved and his CRP has improved as well. Therefore, I will continue the current regimen. However, for his active perianal fistula I will start Cipro plus Flagyl along with mesalamine enema to treat proctitis. I will also start hyoscyamine for his abdominal pain. If his diarrhea persists, I will start budesonide 9 mg daily. I will repeat colonoscopy as well perform EGD in next 2-3 months to see his response to the about  treatment. If he still has active inflammation and/or continues to have active perianal disease, I will discuss with him about changing from Imuran to methotrexate or switch within class to Remicade or switch out of class to ustekinumab.  IBD Health Maintenance  1.TB status: PPD skin test negative as of October, 2017 2. Anemia: Not present, vitamin B12 is 347 on 02/18/2016, Ferritin 15 on 12/16/2015 3.Immunizations: Hep A and B not immune, recommend Twinrix vaccine by his PCP. Influenza-receives annual influenza vaccine, last received in October/2017, he is due for prevnar and pneumovax. 4.Cancer screening I) Colon cancer/dysplasia surveillance: Last colonoscopy in 12/2015, no dysplasia II) Cervical cancer: N/A III) Skin cancer - counseled about annual skin exam by dermatology and skin protection in summer using sun screen SPF > 50, clothing 5.Bone health Vitamin D status: Check vitamin D levels, start calcium plus vitamin D daily Bone density testing: DEXA scan 09/29/2016 revealed osteopenia 5. Labs: Recheck during next visit 6. Smoking: Never smoker 7. NSAIDs and Antibiotics use: No history of NSAID use  Follow up in 4 weeks   [name], MD

## 2016-10-07 ENCOUNTER — Ambulatory Visit: Payer: BC Managed Care – PPO | Admitting: Gastroenterology

## 2016-10-12 ENCOUNTER — Other Ambulatory Visit: Payer: Self-pay

## 2016-10-12 MED ORDER — AZATHIOPRINE 50 MG PO TABS: ORAL_TABLET | ORAL | 11 refills | Status: DC

## 2016-10-13 ENCOUNTER — Ambulatory Visit: Payer: BC Managed Care – PPO | Admitting: Gastroenterology

## 2016-10-20 ENCOUNTER — Other Ambulatory Visit: Payer: Self-pay

## 2016-10-20 ENCOUNTER — Telehealth: Payer: Self-pay | Admitting: General Practice

## 2016-10-20 DIAGNOSIS — M810 Age-related osteoporosis without current pathological fracture: Secondary | ICD-10-CM

## 2016-10-20 NOTE — Telephone Encounter (Signed)
LM for the patient to call back to schedule.  Referral is in the system but was not placed in our work que

## 2016-10-20 NOTE — Telephone Encounter (Signed)
Driscilla Grammes calling from [facility] Dr. [name] RE referral placed in EPIC.   Please contact his office if there are any concerns.  -LL

## 2016-11-02 ENCOUNTER — Telehealth: Payer: Self-pay | Admitting: Gastroenterology

## 2016-11-02 NOTE — Telephone Encounter (Signed)
Patient calling in regards to appt with bone specialist. Please call patient.

## 2016-11-11 ENCOUNTER — Ambulatory Visit (INDEPENDENT_AMBULATORY_CARE_PROVIDER_SITE_OTHER): Payer: BC Managed Care – PPO | Admitting: Gastroenterology

## 2016-11-11 ENCOUNTER — Encounter: Payer: Self-pay | Admitting: Gastroenterology

## 2016-11-11 VITALS — BP 133/70 | HR 77 | Temp 98.0°F | Ht 65.0 in | Wt 135.0 lb

## 2016-11-11 DIAGNOSIS — K50813 Crohn's disease of both small and large intestine with fistula: Secondary | ICD-10-CM

## 2016-11-11 MED ORDER — OMEPRAZOLE 20 MG PO CPDR: 20.0000 mg | DELAYED_RELEASE_CAPSULE | Freq: Every day | ORAL | 2 refills | Status: DC

## 2016-11-11 MED ORDER — BUDESONIDE 3 MG PO CPEP: 9.0000 mg | ORAL_CAPSULE | Freq: Every day | ORAL | 0 refills | Status: AC

## 2016-11-11 NOTE — Addendum Note (Signed)
Addended by: Sherri Sear R on: 11/11/2016 04:45 PM   Modules accepted: Level of Service

## 2016-11-11 NOTE — Progress Notes (Signed)
we'll  [name], MD [address]  [place], [place] [address]  Main: [phone]  Fax: [phone]    Gastroenterology Consultation  Referring Provider:     [name], MD Primary Care Physician:  [name], MD Primary Gastroenterologist:  Dr. [name] Reason for Consultation:     Crohn's disease        HPI:   Leroy Hernandez is a 31 y.o. y/o male referred by Dr. Ouida Sills, [name], MD  for consultation & management of Crohn's disease. He has history of ileocolonic Crohn's diagnosed in 2007, detailed history described below is referred by [name] to establish care. He is currently on cymzia 400 mg every 2 weeks and Imuran 150 mg daily. Currently, he is experiencing nonbloody diarrhea up to 10 times a day associated with urgency and perianal purulent discharge from the fistula. He also reports tenderness in the right perianal area. He reports left-sided abdominal pain associated with nausea but no vomiting. He denies bloating, blood in the stools. He went to [facility] ER 2 days ago due to severe diarrhea and head is given prescription for oral prednisone but he did not filling the prescription yet. He denies fever, chills, loss of appetite, weight loss. He has been adherent with his medications.  Follow up visit 10/04//2018: He reports feeling significantly better since his initial visit. He did rowasa enema, and 4 weeks course of Cipro and Flagyl twice a day. His bowel movements are currently 4-5 times daily, nonbloody but liquidy. He reports minimal urgency and minimal drainage from the perianal fistula. He reports that he started having burning type of abdominal pain which started about 2 weeks after starting antibiotics. He reports taking Cimzia every 2 weeks and Imuran 150 mg daily. He otherwise denies any other complaints.  Crohn's disease classification:  Age: 65 to 76 Location: ileocolonic  Behavior:  stricturing and penetrating  Perianal: Yes  IBD diagnosis:2007  Disease course: He was diagnosed in 2007, was experiencing right lower quadrant pain and bloating, was on oral mesalamine, several courses of prednisone, underwent first surgery in 2011 of transverse colon resection at [facility]. He continued to have diarrhea and required repeated courses of prednisone, established care at [facility] and underwent colonoscopy which revealed severe ileocolonic disease, underwent ileocolonic resection in 11/2011. He reports being started on Humira and Imuran since then and was doing well for some time. He was also taking marijuana and it significantly helped with his symptoms. Subsequently, he had frequent flareups, several hospitalizations and ER visits almost every month secondary to perianal fistula, abscess and bloody diarrhea. He was receiving short courses of antibiotics. He had I&D of right perirectal abscesses in May/2017. He does not recall having a seton placement. His colonoscopy in 03/2015 revealed mild active colitis and anastomotic stricture that was dilated with balloon to 12 mm. He underwent another colonoscopy in 12/2015 which revealed inflammation in the neoterminal ileum as well as colon and was found to have moderately active ileocolonic disease. He was then switched to [place] in 05/2016 and stayed on Imuran 150 mg daily. He was feeling better overall but continued to have active perianal disease resulting in urgency and bloody stools. CT A/P from 06/2016 revealed right perirectal fistula with developing fluid collection concerning for abscess. MRE from 09/2016 revealed no evidence of active disease, however the fistula could not be visualized. His CRP improved from 10 in 06/2016 -1.7 in 09/2016. Trial  of cipro+flagyl BID and rowasa enema resulted in significant improvement of symptoms.   Extra intestinal manifestations: Imaging revealed chronic sacroiliitis. He denies skin, eye  manifestations  IBD surgical history: 2011 : Intestinal resection, transverse colon Diagnosis:  TRANSVERSE COLON AND PROXIMAL TRANSVERSE COLON RESECTION:  SEGMENTS OF BOWEL WITH MODERATE TO FOCALLY SEVERE CHRONIC ACTIVE COLITIS.  EXTENSIVE TRANSMURAL INFLAMMATION.  CRYTPITIS AND CRYPT ABSCESSES ARE NOTED.  THE RESECTION MARGINS APPEAR CLEAR.  THERE IS NO EVIDENCE OF DYSPLASIA OR MALIGNANCY.  NINETEEN BENIGN LYMPH NODES. (0/19)  12/01/2011 ileocolonic resection at [facility] by Dr. Launa Flight - 9 cm TI, 5.5 cm long large bowel Diagnosis:  A:Ileum and colon, resection  -Segment of ileum and colon with moderate to severely active chronic  enteritis, mildly active chronic colitis, and intact anastomosis at distal end  of specimen  -Surgical margins are viable; mildly active chronic colitis is present at  distal margin  -Apparent stricture near ileocecal valve  -Inflammatory polyp near ileocecal valve  -Fibrous obliteration of appendiceal lumen  -Six unremarkable lymph nodes examined microscopically  -No granulomas, viral cytopathic effect, or dysplasia identified   07/04/2015-incision and drainage of right perirectal abscess Imaging:  MRE-09/21/2016 IMPRESSION: 1. Stable surgical changes from prior colon resection and ileal colonic anastomosis. No findings for active Crohn's disease. 2. No acute abdominal/pelvic findings, mass lesions or adenopathy.  CT A/P 06/22/2016 IMPRESSION: 1. The fistula between the right lateral aspect of the rectum and the skin is again identified. The portion of the fistula immediately beneath the skin is more prominent in caliber in the interval with central decreased attenuation worrisome for a developing fluid collection in the distal fistula immediately beneath the cutaneous surface. A developing abscess cannot be excluded. Recommend clinical correlation. 2. Fluid throughout the remaining colon consistent with history  of diarrhea. No colonic or small bowel inflammation identified. The rectum is poorly evaluated due to lack of distention. 3. 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  SBFT none  Procedures: Colonoscopy 09/2009 Diagnosis:  TRANSVERSE COLON MUCOSA COLD BIOPSY:  - MODERATE CHRONIC ACTIVE COLITIS, CONSISTENT WITH PATIENTS  HISTORY OF CROHNS DISEASE.  - NEGATIVE FOR DYSPLASIA AND MALIGNANCY.   Colonoscopy 06/2010 Diagnosis:  ULCERATED RIGHT SIDED COLON BIOPSY:  - MILD CHRONIC ACTIVE COLITIS, COMPATIBLE WITH PATIENTS KNOWN  HISTORY OF CROHNS DISEASE.  - NEGATIVE FOR DYSPLASIA AND MALIGNANCY.   Colonoscopies 07/2012 Diagnosis:  ANASTOMOTIC STRICTURE COLD BIOPSY:  - MILD CHRONIC ACTIVE COLITIS, COMPATIBLE WITH PATIENTS KNOWN  HISTORY OF CROHNS DISEASE.  - NEGATIVE FOR DYSPLASIA AND MALIGNANCY.   Colonoscopies 07/2013 Diagnosis:  ILEUM BIOPSY:  - SMALL BOWEL MUCOSA WITH PRESERVED VILLOUS ARCHITECTURE.  - NEGATIVE FOR INTRAEPITHELIAL LYMPHOCYTOSIS, DYSPLASIA AND  MALIGNANCY.   Colonoscopy 03/2015 Multiple ulcers in the sigmoid colon, no bleeding was present biopsies were taken, tight stricture at the ileocolonic anastomosis unable to get through scope, dilated with 12 mm TTS balloon. The terminal ileum appeared normal DIAGNOSIS:  A. SIGMOID COLON; COLD BIOPSY:  - MILD CHRONIC ACTIVE COLITIS.  - NEGATIVE FOR DYSPLASIA AND MALIGNANCY.   Colonoscopy 12/2015 There was evidence of prior end-to-end ileocolonic anastomosis in the transverse colon. This was characterized by mild stenosis. The anastomosis was traversed. Scattered inflammation, moderate in severity and characterized by shallow ulcerations was found in the proximal ileum. Biopsies were taken with the cold forceps for histology. Inflammation characterized by shallow ulcerations were found no sites were spared. This was moderate in severity. Biopsies were taken. Perianal fistula found on perianal  exam.  Pathology: Small intestine-severe chronic active ileitis with ulceration. 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 Cimzia started in 05/28/2016 along with Imuran. Anti Integrins:  Ustekinumab: Tofactinib: Clinical trial:   Past Medical History:  Diagnosis Date  . Anxiety   . Asthma   . Crohn's disease ([place])   . Rectal fistula     Past Surgical History:  Procedure Laterality Date  . COLON RESECTION    . COLON SURGERY    . COLONOSCOPY WITH PROPOFOL N/A 04/03/2015   Procedure: COLONOSCOPY WITH PROPOFOL;  Surgeon: Hulen Luster, MD;  Location: [facility] ENDOSCOPY;  Service: Endoscopy;  Laterality: N/A;  . COLONOSCOPY WITH PROPOFOL N/A 12/22/2015   Procedure: COLONOSCOPY WITH PROPOFOL;  Surgeon: [name], MD;  Location: [place];  Service: Endoscopy;  Laterality: N/A;  . rectal fistula surgery    . WRIST SURGERY      Prior to Admission medications   Medication Sig Start Date End Date Taking? Authorizing Provider  albuterol (PROVENTIL HFA) 108 (90 Base) MCG/ACT inhaler Inhale 1 Inhaler into the lungs daily.    [provider]  azaTHIOprine (IMURAN) 50 MG tablet Take three tablets by mouth daily 08/22/15   [name], MD  benzonatate  (TESSALON) 200 MG capsule benzonatate 200 mg capsule    [provider]  busPIRone (BUSPAR) 5 MG tablet Take 1 tablet (5 mg total) by mouth 2 (two) times daily. 05/27/16   Hinda Kehr, MD  CIMZIA PREFILLED 2 X 200 MG/ML KIT Inject 1 Dose into the muscle every 14 (fourteen) days.  05/25/16   [provider]  dicyclomine (BENTYL) 20 MG tablet Take 1 tablet (20 mg total) by mouth 3 (three) times daily as needed for spasms. Patient not taking: Reported on 09/09/2016 08/29/16 08/29/17  Gregor Hams, MD  dicyclomine (BENTYL) 20 MG tablet Take 1 tablet (20 mg total) by mouth 3 (three) times daily as needed (abdominal pain). 10/03/16   Nance Pear, MD  escitalopram (LEXAPRO) 10 MG tablet Take 10 mg by mouth daily.  04/02/15   [provider]  LORazepam (ATIVAN) 0.5 MG tablet lorazepam 0.5 mg tablet    [provider]  ondansetron (ZOFRAN-ODT) 8 MG disintegrating tablet ondansetron 8 mg disintegrating tablet    [provider]  OxyCODONE HCl, Abuse Deter, (OXAYDO) 5 MG TABA oxycodone 5 mg tablet    [provider]  oxyCODONE-acetaminophen (ROXICET) 5-325 MG tablet Take 1 tablet by mouth every 6 (six) hours as needed. Patient not taking: Reported on 09/09/2016 07/31/16 07/31/17  Rudene Re, MD  predniSONE (DELTASONE) 20 MG tablet Take 2 tablets (40 mg total) by mouth daily. 10/03/16   Nance Pear, MD  PROAIR HFA 108 [phone] Base) MCG/ACT inhaler Inhale 2 puffs into the lungs every 4 (four) hours as needed. 02/27/16   [provider]  traMADol (ULTRAM) 50 MG tablet Take 1 tablet (50  mg total) by mouth every 6 (six) hours as needed. Patient not taking: Reported on 06/22/2016 12/24/15   Loney Hering, MD    Family History  Problem Relation Age of Onset  . Diabetes Paternal Grandfather   . Heart disease Paternal Grandfather      Social History  Substance Use Topics  . Smoking status: Never Smoker  . Smokeless tobacco: Former Systems developer    . Alcohol use 1.2 oz/week    2 Cans of beer per week    Allergies as of 11/11/2016  . (No Known Allergies)    Review of Systems:    All systems reviewed and negative except where noted in HPI.   Physical Exam:  BP 133/70   Pulse 77   Temp 98 F (36.7 C)   Ht 5' 5"  (1.651 m)   Wt 135 lb (61.2 kg)   BMI 22.47 kg/m  No LMP for male patient.  General:   Alert,  Well-developed, well-nourished, pleasant and cooperative in NAD Head:  Normocephalic and atraumatic. Eyes:  Sclera clear, no icterus.   Conjunctiva pink. Ears:  Normal auditory acuity. Nose:  No deformity, discharge, or lesions. Mouth:  No deformity or lesions,oropharynx pink & moist. Neck:  Supple; no masses or thyromegaly. Lungs:  Respirations even and unlabored.  Clear throughout to auscultation.   No wheezes, crackles, or rhonchi. No acute distress. Heart:  Regular rate and rhythm; no murmurs, clicks, rubs, or gallops. Abdomen:  Normal bowel sounds.  No bruits.  Soft, non-tender and non-distended without masses, hepatosplenomegaly or hernias noted.  No guarding or rebound tenderness.  Midline vertical scar from prior surgery Rectal: Not performed, perianal exam revealed healed fistula in the right perianal area, with  no drainage. The site was nontender Msk:  Symmetrical without gross deformities. Good, equal movement & strength bilaterally. Pulses:  Normal pulses noted. Extremities:  No clubbing or edema.  No cyanosis. Neurologic:  Alert and oriented x3;  grossly normal neurologically. Skin:  Intact without significant lesions or rashes. No jaundice. Psych:  Alert and cooperative. Normal mood and affect.   Assessment and Plan:   STACE PEACE is a 31 y.o. y/o male with Ileocolonic Crohn's disease diagnosed in 2007, stricturing and penetrating with perianal fistula, previously on Humira and Imuran, recurrence of postoperative Crohn's, resulted in discontinuation of Humira. Switched to [place] in 05/2016 biweekly  and continued Imuran 150 mg daily.  Since the start of Cimzia, his luminal symptoms have overall improved and his CRP has improved as well. He had nonbloody diarrhea, urgency, mild abdominal pain and drainage from perianal fistula which have significantly improved with Cipro plus Flagyl along with mesalamine enema to treat proctitis. Since, he is having 4-5 watery bowel movements, I will try budesonide 9 mg daily. He will also continue hyoscyamine for his abdominal pain. I will taper antibiotic Cipro and Flagyl to once a day for 4 more weeks. I will repeat colonoscopy as well perform EGD in next 2-3 months to see his response to the above treatment. If he still has active inflammation, I will discuss with him about changing from Imuran to methotrexate or switch within class to Remicade or switch out of class to ustekinumab.  IBD Health Maintenance  1.TB status: PPD skin test negative as of October, 2017 2. Anemia: Not present, vitamin B12 is 347 on 02/18/2016, Ferritin 15 on 12/16/2015 3.Immunizations: Hep A and B not immune, recommend Twinrix vaccine by his PCP. Influenza-receives annual influenza vaccine, last received in October/2017, he  is due for prevnar and pneumovax. will send a note to Dr. Ouida Sills 4.Cancer screening I) Colon cancer/dysplasia surveillance: Last colonoscopy in 12/2015, no dysplasia II) Cervical cancer: N/A III) Skin cancer - counseled about annual skin exam by dermatology and skin protection in summer using sun screen SPF > 50, clothing 5.Bone health Vitamin D status: Check vitamin D levels, start calcium plus vitamin D daily Bone density testing: DEXA scan 09/29/2016 revealed osteoporosis, Z-score -3.3, referred to endocrinology, has appointment on November 1st  5. Labs: Recheck during next visit, up to date on CBC and LFTs while on Imuran 6. Smoking: Never smoker 7. NSAIDs and Antibiotics use: No history of NSAID use  Follow up in 7month and by my chart in 4 weeks     RCephas Darby MD

## 2016-12-06 ENCOUNTER — Encounter: Payer: Self-pay | Admitting: Gastroenterology

## 2016-12-09 ENCOUNTER — Ambulatory Visit (INDEPENDENT_AMBULATORY_CARE_PROVIDER_SITE_OTHER): Payer: BC Managed Care – PPO | Admitting: Endocrinology

## 2016-12-09 ENCOUNTER — Encounter: Payer: Self-pay | Admitting: Endocrinology

## 2016-12-09 DIAGNOSIS — M810 Age-related osteoporosis without current pathological fracture: Secondary | ICD-10-CM

## 2016-12-09 LAB — VITAMIN D 25 HYDROXY (VIT D DEFICIENCY, FRACTURES): VITD: 13.92 ng/mL — AB (ref 30.00–100.00)

## 2016-12-09 LAB — TSH: TSH: 1.8 u[IU]/mL (ref 0.35–4.50)

## 2016-12-09 NOTE — Progress Notes (Signed)
Subjective:    Patient ID: Leroy Hernandez, male    DOB: 1985/02/27, 31 y.o.   MRN: [id number]  HPI Pt is referred by Dr Ouida Sills, for osteoporosis.  Pt was noted to have osteoporosis in 2018.  This was done, due to chromic intermitt steroid use (since 2007), for Crohn's Dz.  he has never been on medication for osteoporosis.  He has had several bony fractures: right wrist (2000), right little finger (1995), and left hand (2018).  he has no history of any of the following: low testosterone, cancer, renal dz, thyroid problems, prolonged bedrest, steroids, alcoholism, smoking, liver dz, or primary hyperparathyroidism.  He has h/o vit-D deficiency. he does not take heparin or anticonvulsants.  He has chronic moderate abd cramping, and assoc bloody diarrhea.  Past Medical History:  Diagnosis Date  . Anxiety   . Asthma   . Crohn's disease ([place])   . Rectal fistula     Past Surgical History:  Procedure Laterality Date  . COLON RESECTION    . COLON SURGERY    . COLONOSCOPY WITH PROPOFOL N/A 04/03/2015   Procedure: COLONOSCOPY WITH PROPOFOL;  Surgeon: Hulen Luster, MD;  Location: [facility] ENDOSCOPY;  Service: Endoscopy;  Laterality: N/A;  . COLONOSCOPY WITH PROPOFOL N/A 12/22/2015   Procedure: COLONOSCOPY WITH PROPOFOL;  Surgeon: [name], MD;  Location: [place];  Service: Endoscopy;  Laterality: N/A;  . rectal fistula surgery    . WRIST SURGERY      Social History   Social History  . Marital status: Legally Separated    Spouse name: N/A  . Number of children: N/A  . Years of education: N/A   Occupational History  . Not on file.   Social History Main Topics  . Smoking status: Never Smoker  . Smokeless tobacco: Former Systems developer  . Alcohol use 1.2 oz/week    2 Cans of beer per week  . Drug use: No  . Sexual activity: Not on file   Other Topics Concern  . Not on file   Social History Narrative  . No narrative on file    Current Outpatient Prescriptions on File Prior to  Visit  Medication Sig Dispense Refill  . albuterol (PROVENTIL HFA) 108 (90 Base) MCG/ACT inhaler Inhale 1 Inhaler into the lungs daily.    Marland Kitchen azaTHIOprine (IMURAN) 50 MG tablet Take three tablets by mouth daily 90 tablet 11  . benzonatate (TESSALON) 200 MG capsule benzonatate 200 mg capsule    . budesonide (ENTOCORT EC) 3 MG 24 hr capsule Take 3 capsules (9 mg total) by mouth daily. 90 capsule 0  . busPIRone (BUSPAR) 5 MG tablet Take 1 tablet (5 mg total) by mouth 2 (two) times daily. 60 tablet 0  . CIMZIA PREFILLED 2 X 200 MG/ML KIT Inject 1 Dose into the muscle every 14 (fourteen) days.     Marland Kitchen dicyclomine (BENTYL) 20 MG tablet Take 1 tablet (20 mg total) by mouth 3 (three) times daily as needed for spasms. 30 tablet 0  . dicyclomine (BENTYL) 20 MG tablet Take 1 tablet (20 mg total) by mouth 3 (three) times daily as needed (abdominal pain). 30 tablet 0  . escitalopram (LEXAPRO) 10 MG tablet Take 10 mg by mouth daily.     . hyoscyamine (LEVSIN, ANASPAZ) 0.125 MG tablet Take 1 tablet (0.125 mg total) by mouth every 4 (four) hours as needed. 30 tablet 0  . LORazepam (ATIVAN) 0.5 MG tablet lorazepam 0.5 mg tablet    .  mesalamine (ROWASA) 4 g enema Place 60 mLs (4 g total) rectally 2 (two) times daily. 3600 mL 0  . omeprazole (PRILOSEC) 20 MG capsule Take 1 capsule (20 mg total) by mouth daily before breakfast. 60 capsule 2  . ondansetron (ZOFRAN-ODT) 8 MG disintegrating tablet ondansetron 8 mg disintegrating tablet    . OxyCODONE HCl, Abuse Deter, (OXAYDO) 5 MG TABA oxycodone 5 mg tablet    . oxyCODONE-acetaminophen (ROXICET) 5-325 MG tablet Take 1 tablet by mouth every 6 (six) hours as needed. 20 tablet 0  . predniSONE (DELTASONE) 20 MG tablet Take 2 tablets (40 mg total) by mouth daily. 8 tablet 0  . PROAIR HFA 108 (90 Base) MCG/ACT inhaler Inhale 2 puffs into the lungs every 4 (four) hours as needed.    . traMADol (ULTRAM) 50 MG tablet Take 1 tablet (50 mg total) by mouth every 6 (six) hours as  needed. 12 tablet 0   No current facility-administered medications on file prior to visit.     No Known Allergies  Family History  Problem Relation Age of Onset  . Diabetes Paternal Grandfather   . Heart disease Paternal Grandfather   . Osteoporosis Neg Hx     BP 110/60   Pulse 76   Ht _0  (1.651 m)   Wt 128 lb 3.2 oz (58.2 kg)   SpO2 98%   BMI 21.33 kg/m    Review of Systems denies hematuria, heartburn, cold intolerance, edema, skin rash, insomnia, falls, cramps, back pain, easy bruising, and rhinorrhea.  He has weight loss (10 lbs x 3 weeks).  He has anxiety.    Objective:   Physical Exam VS: see vs page GEN: no distress HEAD: head: no deformity eyes: no periorbital swelling, no proptosis external nose and ears are normal mouth: no lesion seen NECK: supple, thyroid is not enlarged CHEST WALL: no deformity.  No kyphosis. LUNGS: clear to auscultation CV: reg rate and rhythm, no murmur ABD: abdomen is soft, nontender.  no hepatosplenomegaly.  not distended.  no hernia.  Old healed surgical scar (midline).  MUSCULOSKELETAL: muscle bulk and strength are grossly normal.  no obvious joint swelling.  gait is normal and steady.  EXTEMITIES: no deformity.  no edema PULSES: no carotid bruit NEURO:  cn 2-12 grossly intact.   readily moves all 4's.  sensation is intact to touch on all 4's SKIN:  Normal texture and temperature.  No rash or suspicious lesion is visible.   NODES:  None palpable at the neck PSYCH: alert, well-oriented.  Does not appear anxious nor depressed.  Lab Results  Component Value Date   CREATININE 1.01 10/03/2016   BUN 9 10/03/2016   NA 141 10/03/2016   K 3.0 (L) 10/03/2016   CL 106 10/03/2016   CO2 27 10/03/2016   Lab Results  Component Value Date   ALT 13 (L) 10/03/2016   AST 20 10/03/2016   ALKPHOS 91 10/03/2016   BILITOT 0.7 10/03/2016    I have reviewed outside records, and summarized: Pt was noted to have elevated a1c, and referred  here.  He was recently seen for Crohn's exacerbation, and vitamin-D was advised.   DEXA: The BMD measured at Femur Neck Right is 0.629 g/cm2 with a Z-score of -3.3  25-OH vit-D=14    Assessment & Plan:  Crohn's dx, new to me Vit-D deficiency, prob due to Crohn's Osteoporosis, at least partially due to the above  Patient Instructions  blood tests are requested for you today.  We'll  let you know about the results. when these are normal, i'll prescribe for you a pill called "boniva." Please come back for a follow-up appointment in 6 months   Addendum: I have sent a prescription to your pharmacy, fir ergocalciferol Recheck labs 1 month

## 2016-12-09 NOTE — Patient Instructions (Signed)
blood tests are requested for you today.  We'll let you know about the results. when these are normal, i'll prescribe for you a pill called "boniva." Please come back for a follow-up appointment in 6 months

## 2016-12-10 LAB — TESTOSTERONE,FREE AND TOTAL
TESTOSTERONE FREE: 10.9 pg/mL (ref 8.7–25.1)
Testosterone: 502 ng/dL (ref 264–916)

## 2016-12-10 MED ORDER — ERGOCALCIFEROL 1.25 MG (50000 UT) PO CAPS: 50000.0000 [IU] | ORAL_CAPSULE | ORAL | 0 refills | Status: DC

## 2016-12-13 ENCOUNTER — Other Ambulatory Visit: Payer: Self-pay

## 2016-12-13 ENCOUNTER — Telehealth: Payer: Self-pay

## 2016-12-13 ENCOUNTER — Encounter: Payer: Self-pay | Admitting: Gastroenterology

## 2016-12-13 ENCOUNTER — Ambulatory Visit (INDEPENDENT_AMBULATORY_CARE_PROVIDER_SITE_OTHER): Payer: BC Managed Care – PPO | Admitting: Gastroenterology

## 2016-12-13 ENCOUNTER — Other Ambulatory Visit
Admission: RE | Admit: 2016-12-13 | Discharge: 2016-12-13 | Disposition: A | Discharge status: Home / Self Care | Payer: BC Managed Care – PPO | Source: Ambulatory Visit | Attending: Gastroenterology | Admitting: Gastroenterology

## 2016-12-13 VITALS — BP 103/67 | Ht 65.0 in | Wt 127.6 lb

## 2016-12-13 DIAGNOSIS — K50113 Crohn's disease of large intestine with fistula: Secondary | ICD-10-CM | POA: Insufficient documentation

## 2016-12-13 DIAGNOSIS — D509 Iron deficiency anemia, unspecified: Secondary | ICD-10-CM

## 2016-12-13 LAB — COMPREHENSIVE METABOLIC PANEL
ALK PHOS: 62 U/L (ref 38–126)
ALT: 11 U/L — AB (ref 17–63)
ANION GAP: 9 (ref 5–15)
AST: 15 U/L (ref 15–41)
Albumin: 3.9 g/dL (ref 3.5–5.0)
BUN: 9 mg/dL (ref 6–20)
CHLORIDE: 102 mmol/L (ref 101–111)
CO2: 28 mmol/L (ref 22–32)
Calcium: 8.8 mg/dL — ABNORMAL LOW (ref 8.9–10.3)
Creatinine, Ser: 0.77 mg/dL (ref 0.61–1.24)
GFR calc Af Amer: >60 mL/min (ref >60)
Glucose, Bld: 87 mg/dL (ref 65–99)
POTASSIUM: 4 mmol/L (ref 3.5–5.1)
Sodium: 139 mmol/L (ref 135–145)
Total Bilirubin: 1.2 mg/dL (ref 0.3–1.2)
Total Protein: 7.2 g/dL (ref 6.5–8.1)

## 2016-12-13 LAB — CBC
HEMATOCRIT: 39.4 % — AB (ref 40.0–52.0)
HEMOGLOBIN: 12.9 g/dL — AB (ref 13.0–18.0)
MCH: 25.8 pg — ABNORMAL LOW (ref 26.0–34.0)
MCHC: 32.7 g/dL (ref 32.0–36.0)
MCV: 79.1 fL — AB (ref 80.0–100.0)
PLATELETS: 331 10*3/uL (ref 150–440)
RBC: 4.98 MIL/uL (ref 4.40–5.90)
RDW: 15.9 % — ABNORMAL HIGH (ref 11.5–14.5)
WBC: 7.7 10*3/uL (ref 3.8–10.6)

## 2016-12-13 LAB — PTH, INTACT AND CALCIUM
CALCIUM: 9.5 mg/dL (ref 8.6–10.3)
PTH: 27 pg/mL (ref 14–64)

## 2016-12-13 LAB — FERRITIN: FERRITIN: 10 ng/mL — AB (ref 24–336)

## 2016-12-13 LAB — VITAMIN B12: VITAMIN B 12: 397 pg/mL (ref 180–914)

## 2016-12-13 LAB — SEDIMENTATION RATE: SED RATE: 19 mm/h — AB (ref 0–15)

## 2016-12-13 LAB — C-REACTIVE PROTEIN: CRP: 2.5 mg/dL — AB (ref <1.0)

## 2016-12-13 MED ORDER — CIPROFLOXACIN HCL 500 MG PO TABS: 500.0000 mg | ORAL_TABLET | Freq: Two times a day (BID) | ORAL | 0 refills | Status: DC

## 2016-12-13 MED ORDER — HYDROCORTISONE 100 MG/60ML RE ENEM: 1.0000 | ENEMA | Freq: Every day | RECTAL | 0 refills | Status: DC

## 2016-12-13 NOTE — Telephone Encounter (Signed)
Patient has been informed that you would like to refer him to Hematology for Iron Def. Anemia to receive iron infusions.  His referral has been entered in [facility].  Thanks [employer]

## 2016-12-13 NOTE — Progress Notes (Signed)
we'll  Leroy Darby, MD [address]  [place]  [place], [place] [address]  Main: [phone]  Fax: [phone]    Gastroenterology Consultation  Referring Provider:     [name], MD Primary Care Physician:  Leroy Ruths, MD Primary Gastroenterologist:  Dr. Cephas Hernandez Reason for Consultation:     Crohn's disease        HPI:   Leroy Hernandez is a 31 y.o. y/o male referred by Dr. Ouida Hernandez, Leroy Cornfield, MD  for consultation & management of Crohn's disease. He has history of ileocolonic Crohn's diagnosed in 2007, detailed history described below is referred by Dr. Allen Hernandez to establish care. He is currently on cymzia 400 mg every 2 weeks and Imuran 150 mg daily. Currently, he is experiencing nonbloody diarrhea up to 10 times a day associated with urgency and perianal purulent discharge from the fistula. He also reports tenderness in the right perianal area. He reports left-sided abdominal pain associated with nausea but no vomiting. He denies bloating, blood in the stools. He went to [facility] 2 days ago due to severe diarrhea and head is given prescription for oral prednisone but he did not filling the prescription yet. He denies fever, chills, loss of appetite, weight loss. He has been adherent with his medications.  Follow up visit 10/04//2018: He reports feeling significantly better since his initial visit. He did rowasa enema, and 4 weeks course of Cipro and Flagyl twice a day. His bowel movements are currently 4-5 times daily, nonbloody but liquidy. He reports minimal urgency and minimal drainage from the perianal fistula. He reports that he started having burning type of abdominal pain which started about 2 weeks after starting antibiotics. He reports taking Cimzia every 2 weeks and Imuran 150 mg daily. He otherwise denies any other complaints.  Follow up visit 11/05//2018: He continued to do worse since last visit. Deceased  appetite lost about 9lbs, He thought antibiotics were not agreeing with his stomach and that's why he hasn't been feeling well. He has been experiencing feeling sick in the stomach, nausea, heartburn and decreased appetite since starting antibiotics. He finished another 4 weeks course of abx but only once daily, and his fistula returned. He also reports about 5-6 episodes of rectal bleeding, mixed with stool. He just started budesonide 2 days ago. He did not try omeprazole for his upper GI symptoms. He has been doing rowasa enemas. Seen by endocrine, Dr Leroy Hernandez for osteoporosis, plan for bonivia. He has low vit D, started on 50K weekly. Has been doing Cimzia every other week and due today. He is taking Imuran 3 pills daily   Crohn's disease classification:  Age: 73 to 16 Location: ileocolonic  Behavior: stricturing and penetrating  Perianal: Yes  IBD diagnosis:2007  Disease course: He was diagnosed in 2007, was experiencing right lower quadrant pain and bloating, was on oral mesalamine, several courses of prednisone, underwent first surgery in 2011 of transverse colon resection at [facility] He continued to have diarrhea and required repeated courses of prednisone, established care at [facility] and underwent colonoscopy which revealed severe ileocolonic disease, underwent ileocolonic resection in 11/2011. He reports being started on Humira and Imuran since then and was doing well for some time. He was also taking marijuana and it significantly helped with his symptoms. Subsequently, he had frequent flareups, several hospitalizations and ER visits almost every month secondary to perianal fistula, abscess and bloody diarrhea. He was receiving short  courses of antibiotics. He had I&D of right perirectal abscesses in May/2017. He does not recall having a seton placement. His colonoscopy in 03/2015 revealed mild active colitis and anastomotic stricture that was dilated with balloon to 12 mm. He  underwent another colonoscopy in 12/2015 which revealed inflammation in the neoterminal ileum as well as colon and was found to have moderately active ileocolonic disease. He was then switched to [place] in 05/2016 and stayed on Imuran 150 mg daily. He was feeling better overall but continued to have active perianal disease resulting in urgency and bloody stools. CT A/P from 06/2016 revealed right perirectal fistula with developing fluid collection concerning for abscess. MRE from 09/2016 revealed no evidence of active disease, however the fistula could not be visualized. His CRP improved from 10 in 06/2016 -1.7 in 09/2016. Trial of cipro+flagyl BID and rowasa enema resulted in significant improvement of symptoms. Return of fistula and rectal bleeding after tapering antibiotic.   Extra intestinal manifestations: Imaging revealed chronic sacroiliitis. He denies skin, eye manifestations  IBD surgical history: 2011 : Intestinal resection, transverse colon Diagnosis:  TRANSVERSE COLON AND PROXIMAL TRANSVERSE COLON RESECTION:  SEGMENTS OF BOWEL WITH MODERATE TO FOCALLY SEVERE CHRONIC ACTIVE COLITIS.  EXTENSIVE TRANSMURAL INFLAMMATION.  CRYTPITIS AND CRYPT ABSCESSES ARE NOTED.  THE RESECTION MARGINS APPEAR CLEAR.  THERE IS NO EVIDENCE OF DYSPLASIA OR MALIGNANCY.  NINETEEN BENIGN LYMPH NODES. (0/19)  12/01/2011 ileocolonic resection at [facility] by Dr. Launa Flight - 9 cm TI, 5.5 cm long large bowel Diagnosis:  A:Ileum and colon, resection  -Segment of ileum and colon with moderate to severely active chronic  enteritis, mildly active chronic colitis, and intact anastomosis at distal end  of specimen  -Surgical margins are viable; mildly active chronic colitis is present at  distal margin  -Apparent stricture near ileocecal valve  -Inflammatory polyp near ileocecal valve  -Fibrous obliteration of appendiceal lumen  -Six unremarkable lymph nodes examined microscopically  -No granulomas,  viral cytopathic effect, or dysplasia identified   07/04/2015-incision and drainage of right perirectal abscess Imaging:  MRE-09/21/2016 IMPRESSION: 1. Stable surgical changes from prior colon resection and ileal colonic anastomosis. No findings for active Crohn's disease. 2. No acute abdominal/pelvic findings, mass lesions or adenopathy.  CT A/P 06/22/2016 IMPRESSION: 1. The fistula between the right lateral aspect of the rectum and the skin is again identified. The portion of the fistula immediately beneath the skin is more prominent in caliber in the interval with central decreased attenuation worrisome for a developing fluid collection in the distal fistula immediately beneath the cutaneous surface. A developing abscess cannot be excluded. Recommend clinical correlation. 2. Fluid throughout the remaining colon consistent with history of diarrhea. No colonic or small bowel inflammation identified. The rectum is poorly evaluated due to lack of distention. 3. 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  SBFT none  Procedures: Colonoscopy 09/2009 Diagnosis:  TRANSVERSE COLON MUCOSA COLD BIOPSY:  - MODERATE CHRONIC ACTIVE COLITIS, CONSISTENT WITH PATIENTS  HISTORY OF CROHNS DISEASE.  - NEGATIVE FOR DYSPLASIA AND MALIGNANCY.   Colonoscopy 06/2010 Diagnosis:  ULCERATED RIGHT SIDED COLON BIOPSY:  - MILD CHRONIC ACTIVE COLITIS, COMPATIBLE WITH PATIENTS KNOWN  HISTORY OF CROHNS DISEASE.  - NEGATIVE FOR DYSPLASIA AND MALIGNANCY.   Colonoscopies 07/2012 Diagnosis:  ANASTOMOTIC STRICTURE COLD BIOPSY:  - MILD CHRONIC ACTIVE COLITIS, COMPATIBLE WITH PATIENTS KNOWN  HISTORY OF CROHNS DISEASE.  - NEGATIVE FOR DYSPLASIA AND MALIGNANCY.   Colonoscopies 07/2013 Diagnosis:  ILEUM BIOPSY:  - SMALL BOWEL MUCOSA WITH  PRESERVED VILLOUS ARCHITECTURE.  - NEGATIVE FOR INTRAEPITHELIAL LYMPHOCYTOSIS, DYSPLASIA AND  MALIGNANCY.   Colonoscopy 03/2015 Multiple  ulcers in the sigmoid colon, no bleeding was present biopsies were taken, tight stricture at the ileocolonic anastomosis unable to get through scope, dilated with 12 mm TTS balloon. The terminal ileum appeared normal DIAGNOSIS:  A. SIGMOID COLON; COLD BIOPSY:  - MILD CHRONIC ACTIVE COLITIS.  - NEGATIVE FOR DYSPLASIA AND MALIGNANCY.   Colonoscopy 12/2015 There was evidence of prior end-to-end ileocolonic anastomosis in the transverse colon. This was characterized by mild stenosis. The anastomosis was traversed. Scattered inflammation, moderate in severity and characterized by shallow ulcerations was found in the proximal ileum. Biopsies were taken with the cold forceps for histology. Inflammation characterized by shallow ulcerations were found no sites were spared. This was moderate in severity. Biopsies were taken. Perianal fistula found on perianal exam. Pathology: Small intestine-severe chronic active ileitis with ulceration. 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 Cimzia started in 05/28/2016 along with Imuran. Anti Integrins:  Ustekinumab: Tofactinib: Clinical trial:   Past Medical History:  Diagnosis Date  . Anxiety   . Asthma   . Crohn's disease ([place])   .  Rectal fistula     Past Surgical History:  Procedure Laterality Date  . COLON RESECTION    . COLON SURGERY    . rectal fistula surgery    . WRIST SURGERY      Prior to Admission medications   Medication Sig Start Date End Date Taking? Authorizing Provider  albuterol (PROVENTIL HFA) 108 (90 Base) MCG/ACT inhaler Inhale 1 Inhaler into the lungs daily.    [provider]  azaTHIOprine (IMURAN) 50 MG tablet Take three tablets by mouth daily 08/22/15   [name], MD  benzonatate (TESSALON) 200 MG capsule benzonatate 200 mg capsule    [provider]  busPIRone (BUSPAR) 5 MG tablet Take 1 tablet (5 mg total) by mouth 2 (two) times daily. 05/27/16   Hinda Kehr, MD  CIMZIA PREFILLED 2 X 200 MG/ML KIT Inject 1 Dose into the muscle every 14 (fourteen) days.  05/25/16   [provider]  dicyclomine (BENTYL) 20 MG tablet Take 1 tablet (20 mg total) by mouth 3 (three) times daily as needed for spasms. Patient not taking: Reported on 09/09/2016 08/29/16 08/29/17  Gregor Hams, MD  dicyclomine (BENTYL) 20 MG tablet Take 1 tablet (20 mg total) by mouth 3 (three) times daily as needed (abdominal pain). 10/03/16   Nance Pear, MD  escitalopram (LEXAPRO) 10 MG tablet Take 10 mg by mouth daily.  04/02/15   [provider]  LORazepam (ATIVAN) 0.5 MG tablet lorazepam 0.5 mg tablet    [provider]  ondansetron (ZOFRAN-ODT) 8 MG disintegrating tablet ondansetron 8 mg disintegrating tablet    [provider]  OxyCODONE HCl, Abuse Deter, (OXAYDO) 5 MG TABA oxycodone 5 mg tablet  [provider]  oxyCODONE-acetaminophen (ROXICET) 5-325 MG tablet Take 1 tablet by mouth every 6 (six) hours as needed. Patient not taking: Reported on 09/09/2016 07/31/16 07/31/17  Rudene Re, MD  predniSONE (DELTASONE) 20 MG tablet Take 2 tablets (40 mg total) by mouth daily. 10/03/16   Nance Pear, MD  PROAIR HFA 108 [phone] Base) MCG/ACT inhaler Inhale 2  puffs into the lungs every 4 (four) hours as needed. 02/27/16   [provider]  traMADol (ULTRAM) 50 MG tablet Take 1 tablet (50 mg total) by mouth every 6 (six) hours as needed. Patient not taking: Reported on 06/22/2016 12/24/15   Loney Hering, MD    Family History  Problem Relation Age of Onset  . Diabetes Paternal Grandfather   . Heart disease Paternal Grandfather   . Osteoporosis Neg Hx      Social History   Tobacco Use  . Smoking status: Never Smoker  . Smokeless tobacco: Former Network engineer Use Topics  . Alcohol use: Yes    Alcohol/week: 1.2 oz    Types: 2 Cans of beer per week  . Drug use: No    Allergies as of 12/13/2016  . (No Known Allergies)    Review of Systems:    All systems reviewed and negative except where noted in HPI.   Physical Exam:  BP 103/67   Ht 5' 5"  (1.651 m)   Wt 127 lb 9.6 oz (57.9 kg)   BMI 21.23 kg/m  No LMP for male patient.  General:   Alert,  Well-developed, well-nourished, pleasant and cooperative in NAD Head:  Normocephalic and atraumatic. Eyes:  Sclera clear, no icterus.   Conjunctiva pink. Ears:  Normal auditory acuity. Nose:  No deformity, discharge, or lesions. Mouth:  No deformity or lesions,oropharynx pink & moist. Neck:  Supple; no masses or thyromegaly. Lungs:  Respirations even and unlabored.  Clear throughout to auscultation.   No wheezes, crackles, or rhonchi. No acute distress. Heart:  Regular rate and rhythm; no murmurs, clicks, rubs, or gallops. Abdomen:  Normal bowel sounds.  No bruits.  Soft, non-tender and non-distended without masses, hepatosplenomegaly or hernias noted.  No guarding or rebound tenderness.  Midline vertical scar from prior surgery Rectal: Not performed, perianal exam revealed fistula in the right perianal area, with purulent drainage that was dried up. There was no further drainage expressing from the fistula. The site was slightly tender Msk:  Symmetrical without gross  deformities. Good, equal movement & strength bilaterally. Pulses:  Normal pulses noted. Extremities:  No clubbing or edema.  No cyanosis. Neurologic:  Alert and oriented x3;  grossly normal neurologically. Skin:  Intact without significant lesions or rashes. No jaundice. Psych:  Alert and cooperative. Normal mood and affect.   Assessment and Plan:   RAIDER VALBUENA is a 31 y.o. y/o male with Ileocolonic Crohn's disease diagnosed in 2007, stricturing and penetrating with perianal fistula, previously on Humira and Imuran, recurrence of postoperative Crohn's, resulted in discontinuation of Humira. Switched to [place] in 05/2016 biweekly and continued Imuran 150 mg daily.  Since the start of Cimzia, his luminal symptoms have overall improved and his CRP has improved as well. He had nonbloody diarrhea, urgency, mild abdominal pain and drainage from perianal fistula which have significantly improved with Cipro plus Flagyl along with mesalamine enema to treat proctitis. Now with, return of rectal bleeding and actively draining fistula. I'm worried that he may still have active disease despite being on Cimzia and azathioprine.  Recs:  1. Decrease imuran to 1 pill daily 2. Start cipro 526m BID 3. Continue budesonide 371m3capsules once daily 4. Continue rowasa enema at bedtime 5. Start cortenema in the morning 6. Labs and stool studies, rule out infectious diarrhea particularly C. difficile 7. EGD and colonoscopy to assess response to Cimzia 8. Encouraged him to increase dietary intake as well as protein drinks such as Ensure 2 times daily between meals  If he still has active inflammation, I will discuss with him about changing from Imuran to methotrexate and switch within class to Remicade or switch out of class to ustekinumab.  IBD Health Maintenance  1.TB status: PPD skin test negative as of October, 2017 2. Anemia: Not present, vitamin B12 is 347 on 02/18/2016, Ferritin 15 on 12/16/2015,  recheck CBC, B12 and ferritin today 3.Immunizations: Hep A and B not immune, recommend Twinrix vaccine by his PCP. Influenza-receives annual influenza vaccine, last received in October/2017, he is due for prevnar and pneumovax. will send a note to Dr. AnOuida Hernandez.Cancer screening I) Colon cancer/dysplasia surveillance: Last colonoscopy in 12/2015, no dysplasia II) Cervical cancer: N/A III) Skin cancer - counseled about annual skin exam by dermatology and skin protection in summer using sun screen SPF > 50, clothing 5.Bone health: Has osteoporosis, altered by endocrine, plan for reclast infusion Vitamin D status: Low vitamin D levels, on 50 K units weekly Bone density testing: DEXA scan 09/29/2016 revealed osteoporosis, Z-score -3.3 5. Labs: Recheck today 6. Smoking: Never smoker 7. NSAIDs and Antibiotics use: No history of NSAID use  Follow up at the time of GI procedures and in 4 weeks    RoCephas DarbyMD

## 2016-12-13 NOTE — Patient Instructions (Signed)
1. Decrease imuran to 1 pill daily 2. Start cipro 512m BID 3. Continue budesonide 366m3capsules once daily 4. Continue rowasa enema at bedtime 5. Start cortenema in the morning 6. Labs and stool studies 7. EGD and colonoscopy 8. F/w in 4weeks  Please call our office to speak with my nurse MiDriscilla Grammest [phone] business hours from 8am to 4pm if you have any questions/concerns. During after hours, you will be redirected to on call GI physician. For any emergency please call 911 or go the nearest emergency room.    RoCephas DarbyMD [address] [address]Main: [phone]: [phone]

## 2016-12-14 ENCOUNTER — Other Ambulatory Visit
Admission: RE | Admit: 2016-12-14 | Discharge: 2016-12-14 | Disposition: A | Discharge status: Home / Self Care | Payer: BC Managed Care – PPO | Source: Other Acute Inpatient Hospital | Attending: Gastroenterology | Admitting: Gastroenterology

## 2016-12-14 DIAGNOSIS — K50113 Crohn's disease of large intestine with fistula: Secondary | ICD-10-CM | POA: Insufficient documentation

## 2016-12-14 LAB — GASTROINTESTINAL PANEL BY PCR, STOOL (REPLACES STOOL CULTURE)
Adenovirus F40/41: NOT DETECTED
Astrovirus: NOT DETECTED
CRYPTOSPORIDIUM: NOT DETECTED
Campylobacter species: NOT DETECTED
Cyclospora cayetanensis: NOT DETECTED
ENTEROPATHOGENIC E COLI (EPEC): NOT DETECTED
ENTEROTOXIGENIC E COLI (ETEC): NOT DETECTED
Entamoeba histolytica: NOT DETECTED
Enteroaggregative E coli (EAEC): NOT DETECTED
GIARDIA LAMBLIA: NOT DETECTED
Norovirus GI/GII: NOT DETECTED
Plesimonas shigelloides: NOT DETECTED
ROTAVIRUS A: NOT DETECTED
SHIGELLA/ENTEROINVASIVE E COLI (EIEC): NOT DETECTED
Salmonella species: NOT DETECTED
Sapovirus (I, II, IV, and V): NOT DETECTED
Shiga like toxin producing E coli (STEC): NOT DETECTED
VIBRIO CHOLERAE: NOT DETECTED
VIBRIO SPECIES: NOT DETECTED
Yersinia enterocolitica: NOT DETECTED

## 2016-12-14 LAB — C DIFFICILE QUICK SCREEN W PCR REFLEX
C DIFFICILE (CDIFF) INTERP: NOT DETECTED
C Diff antigen: NEGATIVE
C Diff toxin: NEGATIVE

## 2016-12-20 LAB — CALPROTECTIN, FECAL: CALPROTECTIN, FECAL: 370 ug/g — AB (ref 0–120)

## 2016-12-21 ENCOUNTER — Encounter: Payer: Self-pay | Admitting: *Deleted

## 2016-12-21 NOTE — Discharge Instructions (Signed)
General Anesthesia, Adult, Care After °These instructions provide you with information about caring for yourself after your procedure. Your health care provider may also give you more specific instructions. Your treatment has been planned according to current medical practices, but problems sometimes occur. Call your health care provider if you have any problems or questions after your procedure. °What can I expect after the procedure? °After the procedure, it is common to have: °· Vomiting. °· A sore throat. °· Mental slowness. ° °It is common to feel: °· Nauseous. °· Cold or shivery. °· Sleepy. °· Tired. °· Sore or achy, even in parts of your body where you did not have surgery. ° °Follow these instructions at home: °For at least 24 hours after the procedure: °· Do not: °? Participate in activities where you could fall or become injured. °? Drive. °? Use heavy machinery. °? Drink alcohol. °? Take sleeping pills or medicines that cause drowsiness. °? Make important decisions or sign legal documents. °? Take care of children on your own. °· Rest. °Eating and drinking °· If you vomit, drink water, juice, or soup when you can drink without vomiting. °· Drink enough fluid to keep your urine clear or pale yellow. °· Make sure you have little or no nausea before eating solid foods. °· Follow the diet recommended by your health care provider. °General instructions °· Have a responsible adult stay with you until you are awake and alert. °· Return to your normal activities as told by your health care provider. Ask your health care provider what activities are safe for you. °· Take over-the-counter and prescription medicines only as told by your health care provider. °· If you smoke, do not smoke without supervision. °· Keep all follow-up visits as told by your health care provider. This is important. °Contact a health care provider if: °· You continue to have nausea or vomiting at home, and medicines are not helpful. °· You  cannot drink fluids or start eating again. °· You cannot urinate after 8-12 hours. °· You develop a skin rash. °· You have fever. °· You have increasing redness at the site of your procedure. °Get help right away if: °· You have difficulty breathing. °· You have chest pain. °· You have unexpected bleeding. °· You feel that you are having a life-threatening or urgent problem. °This information is not intended to replace advice given to you by your health care provider. Make sure you discuss any questions you have with your health care provider. °Document Released: 05/03/2000 Document Revised: 06/30/2015 Document Reviewed: 01/09/2015 °Elsevier Interactive Patient Education © 2018 Elsevier Inc. ° °

## 2016-12-22 ENCOUNTER — Other Ambulatory Visit: Payer: Self-pay

## 2016-12-22 ENCOUNTER — Ambulatory Visit: Payer: BC Managed Care – PPO | Admitting: Anesthesiology

## 2016-12-22 ENCOUNTER — Ambulatory Visit
Admission: RE | Admit: 2016-12-22 | Discharge: 2016-12-22 | Disposition: A | Discharge status: Home / Self Care | Payer: BC Managed Care – PPO | Source: Ambulatory Visit | Attending: Gastroenterology | Admitting: Gastroenterology

## 2016-12-22 ENCOUNTER — Encounter
Admission: RE | Disposition: A | Discharge status: Home / Self Care | Payer: Self-pay | Source: Ambulatory Visit | Attending: Gastroenterology

## 2016-12-22 ENCOUNTER — Telehealth: Payer: Self-pay

## 2016-12-22 DIAGNOSIS — K2950 Unspecified chronic gastritis without bleeding: Secondary | ICD-10-CM | POA: Insufficient documentation

## 2016-12-22 DIAGNOSIS — K603 Anal fistula: Secondary | ICD-10-CM | POA: Diagnosis not present

## 2016-12-22 DIAGNOSIS — K529 Noninfective gastroenteritis and colitis, unspecified: Secondary | ICD-10-CM | POA: Insufficient documentation

## 2016-12-22 DIAGNOSIS — Z980 Intestinal bypass and anastomosis status: Secondary | ICD-10-CM | POA: Diagnosis not present

## 2016-12-22 DIAGNOSIS — K633 Ulcer of intestine: Secondary | ICD-10-CM | POA: Diagnosis not present

## 2016-12-22 DIAGNOSIS — F419 Anxiety disorder, unspecified: Secondary | ICD-10-CM | POA: Insufficient documentation

## 2016-12-22 DIAGNOSIS — K50813 Crohn's disease of both small and large intestine with fistula: Secondary | ICD-10-CM | POA: Diagnosis not present

## 2016-12-22 DIAGNOSIS — K5080 Crohn's disease of both small and large intestine without complications: Secondary | ICD-10-CM | POA: Diagnosis present

## 2016-12-22 DIAGNOSIS — K50812 Crohn's disease of both small and large intestine with intestinal obstruction: Secondary | ICD-10-CM | POA: Diagnosis not present

## 2016-12-22 DIAGNOSIS — R1013 Epigastric pain: Secondary | ICD-10-CM | POA: Diagnosis not present

## 2016-12-22 DIAGNOSIS — K50012 Crohn's disease of small intestine with intestinal obstruction: Secondary | ICD-10-CM | POA: Diagnosis not present

## 2016-12-22 DIAGNOSIS — Z8249 Family history of ischemic heart disease and other diseases of the circulatory system: Secondary | ICD-10-CM | POA: Diagnosis not present

## 2016-12-22 DIAGNOSIS — K50113 Crohn's disease of large intestine with fistula: Secondary | ICD-10-CM

## 2016-12-22 DIAGNOSIS — Z9889 Other specified postprocedural states: Secondary | ICD-10-CM | POA: Diagnosis not present

## 2016-12-22 DIAGNOSIS — K269 Duodenal ulcer, unspecified as acute or chronic, without hemorrhage or perforation: Secondary | ICD-10-CM | POA: Diagnosis not present

## 2016-12-22 DIAGNOSIS — K2980 Duodenitis without bleeding: Secondary | ICD-10-CM | POA: Insufficient documentation

## 2016-12-22 DIAGNOSIS — Z833 Family history of diabetes mellitus: Secondary | ICD-10-CM | POA: Insufficient documentation

## 2016-12-22 DIAGNOSIS — Z79899 Other long term (current) drug therapy: Secondary | ICD-10-CM | POA: Insufficient documentation

## 2016-12-22 HISTORY — PX: COLONOSCOPY WITH PROPOFOL: SHX5780

## 2016-12-22 HISTORY — PX: ESOPHAGOGASTRODUODENOSCOPY (EGD) WITH PROPOFOL: SHX5813

## 2016-12-22 SURGERY — COLONOSCOPY WITH PROPOFOL
Anesthesia: General | Wound class: Clean Contaminated

## 2016-12-22 MED ORDER — FENTANYL CITRATE (PF) 100 MCG/2ML IJ SOLN: 25.0000 ug | INTRAMUSCULAR | Status: DC | PRN

## 2016-12-22 MED ORDER — GLYCOPYRROLATE 0.2 MG/ML IJ SOLN
INTRAMUSCULAR | Status: DC | PRN
  Administered 2016-12-22: 0.1 mg via INTRAVENOUS

## 2016-12-22 MED ORDER — OXYCODONE HCL 5 MG/5ML PO SOLN: 5.0000 mg | Freq: Once | ORAL | Status: DC | PRN

## 2016-12-22 MED ORDER — SODIUM CHLORIDE 0.9 % IV SOLN
INTRAVENOUS | Status: DC
Start: 2016-12-22 — End: 2016-12-22

## 2016-12-22 MED ORDER — LIDOCAINE HCL (CARDIAC) 20 MG/ML IV SOLN
INTRAVENOUS | Status: DC | PRN
  Administered 2016-12-22: 50 mg via INTRAVENOUS

## 2016-12-22 MED ORDER — LACTATED RINGERS IV SOLN
10.0000 mL/h | INTRAVENOUS | Status: DC
  Administered 2016-12-22: 11:00:00 via INTRAVENOUS
  Administered 2016-12-22: 10 mL/h via INTRAVENOUS

## 2016-12-22 MED ORDER — OXYCODONE HCL 5 MG PO TABS: 5.0000 mg | ORAL_TABLET | Freq: Once | ORAL | Status: DC | PRN

## 2016-12-22 MED ORDER — PROPOFOL 10 MG/ML IV BOLUS
INTRAVENOUS | Status: DC | PRN
  Administered 2016-12-22: 30 mg via INTRAVENOUS
  Administered 2016-12-22: 50 mg via INTRAVENOUS
  Administered 2016-12-22 (×3): 30 mg via INTRAVENOUS
  Administered 2016-12-22: 40 mg via INTRAVENOUS
  Administered 2016-12-22: 20 mg via INTRAVENOUS
  Administered 2016-12-22: 150 mg via INTRAVENOUS
  Administered 2016-12-22: 20 mg via INTRAVENOUS
  Administered 2016-12-22: 30 mg via INTRAVENOUS
  Administered 2016-12-22 (×2): 20 mg via INTRAVENOUS
  Administered 2016-12-22 (×2): 30 mg via INTRAVENOUS

## 2016-12-22 MED ORDER — PROMETHAZINE HCL 25 MG/ML IJ SOLN: 6.2500 mg | INTRAMUSCULAR | Status: DC | PRN

## 2016-12-22 MED ORDER — MEPERIDINE HCL 25 MG/ML IJ SOLN: 6.2500 mg | INTRAMUSCULAR | Status: DC | PRN

## 2016-12-22 SURGICAL SUPPLY — 37 items
BALLN CRE LF 10-12 240X5.5 (BALLOONS) ×2
BALLN DILATOR 10-12 8 (BALLOONS)
BALLN DILATOR 12-15 8 (BALLOONS)
BALLN DILATOR 15-18 8 (BALLOONS)
BALLN DILATOR CRE 0-12 8 (BALLOONS)
BALLN DILATOR ESOPH 8 10 CRE (MISCELLANEOUS) IMPLANT
BALLOON CRE LF 10-12 240X5.5 (BALLOONS) ×1 IMPLANT
BALLOON DILATOR 12-15 8 (BALLOONS) IMPLANT
BALLOON DILATOR 15-18 8 (BALLOONS) IMPLANT
BALLOON DILATOR CRE 0-12 8 (BALLOONS) IMPLANT
BLOCK BITE 60FR ADLT L/F GRN (MISCELLANEOUS) ×2 IMPLANT
CANISTER SUCT 1200ML W/VALVE (MISCELLANEOUS) ×2 IMPLANT
CLIP HMST 235XBRD CATH ROT (MISCELLANEOUS) IMPLANT
CLIP RESOLUTION 360 11X235 (MISCELLANEOUS)
FCP ESCP3.2XJMB 240X2.8X (MISCELLANEOUS)
FORCEPS BIOP RAD 4 LRG CAP 4 (CUTTING FORCEPS) ×2 IMPLANT
FORCEPS BIOP RJ4 240 W/NDL (MISCELLANEOUS)
FORCEPS ESCP3.2XJMB 240X2.8X (MISCELLANEOUS) IMPLANT
GOWN CVR UNV OPN BCK APRN NK (MISCELLANEOUS) ×2 IMPLANT
GOWN ISOL THUMB LOOP REG UNIV (MISCELLANEOUS) ×2
INJECTOR VARIJECT VIN23 (MISCELLANEOUS) IMPLANT
KIT DEFENDO VALVE AND CONN (KITS) IMPLANT
KIT ENDO PROCEDURE OLY (KITS) ×2 IMPLANT
MARKER SPOT ENDO TATTOO 5ML (MISCELLANEOUS) IMPLANT
PAD GROUND ADULT SPLIT (MISCELLANEOUS) IMPLANT
PROBE APC STR FIRE (PROBE) IMPLANT
RETRIEVER NET PLAT FOOD (MISCELLANEOUS) IMPLANT
RETRIEVER NET ROTH 2.5X230 LF (MISCELLANEOUS) IMPLANT
SNARE SHORT THROW 13M SML OVAL (MISCELLANEOUS) IMPLANT
SNARE SHORT THROW 30M LRG OVAL (MISCELLANEOUS) IMPLANT
SNARE SNG USE RND 15MM (INSTRUMENTS) IMPLANT
SPOT EX ENDOSCOPIC TATTOO (MISCELLANEOUS)
SYR INFLATION 60ML (SYRINGE) ×2 IMPLANT
TRAP ETRAP POLY (MISCELLANEOUS) IMPLANT
VARIJECT INJECTOR VIN23 (MISCELLANEOUS)
WATER STERILE IRR 250ML POUR (IV SOLUTION) ×2 IMPLANT
WIRE CRE 18-20MM 8CM F G (MISCELLANEOUS) IMPLANT

## 2016-12-22 NOTE — H&P (Signed)
Leroy Darby, MD [address]  [place]  [place], [place] [address]  Main: [phone]  Fax: [phone] Pager: [phone]  Primary Care Physician:  [name], MD Primary Gastroenterologist:  Dr. Cephas Hernandez  Pre-Procedure History & Physical: HPI:  Leroy Hernandez is a 31 y.o. male is here for an endoscopy and colonoscopy.   Past Medical History:  Diagnosis Date  . Anxiety   . Asthma   . Crohn's disease ([place])   . Rectal fistula     Past Surgical History:  Procedure Laterality Date  . COLON RESECTION    . COLON SURGERY    . rectal fistula surgery    . WRIST SURGERY      Prior to Admission medications   Medication Sig Start Date End Date Taking? Authorizing Provider  busPIRone (BUSPAR) 5 MG tablet Take 1 tablet (5 mg total) by mouth 2 (two) times daily. 05/27/16  Yes Hinda Kehr, MD  CIMZIA PREFILLED 2 X 200 MG/ML KIT Inject 1 Dose into the muscle every 14 (fourteen) days.  05/25/16  Yes [provider]  ciprofloxacin (CIPRO) 500 MG tablet Take 1 tablet (500 mg total) 2 (two) times daily by mouth. 12/13/16 01/12/17 Yes Jashad Depaula, Tally Due, MD  ergocalciferol (VITAMIN D2) 50000 units capsule Take 1 capsule (50,000 Units total) by mouth 3 (three) times a week. 12/10/16 01/08/17 Yes Renato Shin, MD  escitalopram (LEXAPRO) 10 MG tablet Take 10 mg by mouth daily.  04/02/15  Yes [provider]  hydrocortisone (CORTENEMA) 100 MG/60ML enema Place 1 enema (100 mg total) at bedtime for 28 days rectally. 12/13/16 01/10/17 Yes Aryaman Haliburton, Tally Due, MD  ondansetron (ZOFRAN-ODT) 8 MG disintegrating tablet ondansetron 8 mg disintegrating tablet   Yes [provider]  omeprazole (PRILOSEC) 20 MG capsule Take 1 capsule (20 mg total) by mouth daily before breakfast. 11/11/16 12/11/16  Lin Landsman, MD    Allergies as of 12/13/2016  . (No Known Allergies)    Family History  Problem Relation Age of Onset  . Diabetes Paternal Grandfather    . Heart disease Paternal Grandfather   . Osteoporosis Neg Hx     Social History   Socioeconomic History  . Marital status: Legally Separated    Spouse name: Not on file  . Number of children: Not on file  . Years of education: Not on file  . Highest education level: Not on file  Social Needs  . Financial resource strain: Not on file  . Food insecurity - worry: Not on file  . Food insecurity - inability: Not on file  . Transportation needs - medical: Not on file  . Transportation needs - non-medical: Not on file  Occupational History  . Not on file  Tobacco Use  . Smoking status: Never Smoker  . Smokeless tobacco: Former Network engineer and Sexual Activity  . Alcohol use: Yes    Alcohol/week: 1.2 oz    Types: 2 Cans of beer per week  . Drug use: No  . Sexual activity: Not on file  Other Topics Concern  . Not on file  Social History Narrative  . Not on file    Review of Systems: See HPI, otherwise negative ROS  Physical Exam: BP 112/76   Pulse 68   Temp 97.7 F (36.5 C) (Temporal)   Resp 16   Ht 5' 5"  (1.651 m)   Wt 125 lb (56.7 kg)   SpO2 99%   BMI 20.80 kg/m  General:  Alert,  pleasant and cooperative in NAD Head:  Normocephalic and atraumatic. Neck:  Supple; no masses or thyromegaly. Lungs:  Clear throughout to auscultation.    Heart:  Regular rate and rhythm. Abdomen:  Soft, nontender and nondistended. Normal bowel sounds, without guarding, and without rebound.   Neurologic:  Alert and  oriented x4;  grossly normal neurologically.  Impression/Plan: Leroy Hernandez is here for an endoscopy and colonoscopy to be performed for flare up of crohn's disease  Risks, benefits, limitations, and alternatives regarding  endoscopy and colonoscopy have been reviewed with the patient.  Questions have been answered.  All parties agreeable.   Sherri Sear, MD  12/22/2016, 9:57 AM

## 2016-12-22 NOTE — Telephone Encounter (Signed)
Referral has been sent to [facility] for pt to get Remicade.  Thanks [employer]

## 2016-12-22 NOTE — Op Note (Signed)
[facility] Gastroenterology Patient Name: Leroy Hernandez Procedure Date: 12/22/2016 10:28 AM MRN: [id number] Account #: [id number] Date of Birth: [birth year] Admit Type: Outpatient Age: 31 Room: [facility] OR ROOM 01 Gender: Male Note Status: Finalized Procedure:            Colonoscopy Indications:          Follow-up of Crohn's disease of the small bowel and                        colon, Disease activity assessment of Crohn's disease                        of the small bowel and colon, Assess therapeutic                        response to therapy of Crohn's disease of the small                        bowel and colon Providers:            Lin Landsman MD, MD Referring MD:         [name]. Ouida Sills MD, MD (Referring MD) Medicines:            Monitored Anesthesia Care Complications:        No immediate complications. Estimated blood loss:                        Minimal. Procedure:            Pre-Anesthesia Assessment:                       - Prior to the procedure, a History and Physical was                        performed, and patient medications and allergies were                        reviewed. The patient is competent. The risks and                        benefits of the procedure and the sedation options and                        risks were discussed with the patient. All questions                        were answered and informed consent was obtained.                        Patient identification and proposed procedure were                        verified by the physician, the nurse, the                        anesthesiologist, the anesthetist and the technician in                        the pre-procedure area in the procedure room. Mental  Status Examination: alert and oriented. Airway                        Examination: normal oropharyngeal airway and neck                        mobility. Respiratory Examination: clear to                 auscultation. CV Examination: normal. Prophylactic                        Antibiotics: The patient does not require prophylactic                        antibiotics. Prior Anticoagulants: The patient has                        taken no previous anticoagulant or antiplatelet agents.                        ASA Grade Assessment: II - A patient with mild systemic                        disease. After reviewing the risks and benefits, the                        patient was deemed in satisfactory condition to undergo                        the procedure. The anesthesia plan was to use monitored                        anesthesia care (MAC). Immediately prior to                        administration of medications, the patient was                        re-assessed for adequacy to receive sedatives. The                        heart rate, respiratory rate, oxygen saturations, blood                        pressure, adequacy of pulmonary ventilation, and                        response to care were monitored throughout the                        procedure. The physical status of the patient was                        re-assessed after the procedure.                       After obtaining informed consent, the colonoscope was                        passed under direct vision. Throughout the procedure,  the patient's blood pressure, pulse, and oxygen                        saturations were monitored continuously. The [place] (S#: [id number]) was introduced through                        the anus and advanced to the the ileocolonic                        anastomosis. The colonoscopy was performed without                        difficulty. The patient tolerated the procedure well.                        The quality of the bowel preparation was adequate. Findings:      The perianal exam findings include perianal fistula.       There was evidence of a prior functional end-to-end ileo-colonic       anastomosis at 55 cm proximal to the anus. This was patent and was       characterized by mild stenosis. The anastomosis was traversed. A TTS       dilator was passed through the scope. Dilation with a 11-19-10 mm       colonic balloon dilator was performed. The dilation site was examined       following endoscope reinsertion and showed mild improvement in luminal       narrowing. Estimated blood loss was minimal.      A diffuse area of mucosa in the neo-terminal ileum was mildly ulcerated,       aphthae seen. Biopsies were taken with a cold forceps for histology.       Rutgeert's i4      Discontinuous areas of nonbleeding ulcerated mucosa with no stigmata of       recent bleeding were present in the rectum, in the recto-sigmoid colon       and in the ascending colon. Biopsies were taken with a cold forceps for       histology.      The retroflexed view of the distal rectum and anal verge was normal       otherwise and showed no anal or rectal abnormalities. 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                       - Patent functional end-to-end ileo-colonic                        anastomosis, characterized by mild stenosis. Dilated to                        42m.                       - Ulcerated mucosa in the neo-terminal ileum. Biopsied.                       - Mucosal ulceration in colon. Biopsied.                       -  Active ileocolonic Crohn's disease, Rutgeert's i4 on                        cimzia and imuran                       - The distal rectum and anal verge are normal on                        retroflexion view. Recommendation:       - Discharge patient to home.                       - Resume regular diet today.                       - Continue present medications.                       - Continue entocort and cipro                       - Await pathology results.                        - Will discuss with pt about switching to remicade or                        stelara Procedure Code(s):    --- Professional ---                       [phone], Colonoscopy, flexible; with transendoscopic                        balloon dilation                       45380, Colonoscopy, flexible; with biopsy, single or                        multiple Diagnosis Code(s):    --- Professional ---                       K60.3, Anal fistula                       Z98.0, Intestinal bypass and anastomosis status                       K63.3, Ulcer of intestine                       K50.80, Crohn's disease of both small and large                        intestine without complications CPT copyright 2016 American Medical Association. All rights reserved. The codes documented in this report are preliminary and upon coder review may  be revised to meet current compliance requirements. Dr. Ulyess Mort Lin Landsman MD, MD 12/22/2016 11:29:58 AM This report has been signed electronically. Number of Addenda: 0 Note Initiated On: 12/22/2016 10:28 AM Scope Withdrawal Time: 0 hours 21 minutes 29 seconds  Total Procedure Duration: 0 hours 24 minutes 18 seconds  [facility]

## 2016-12-22 NOTE — Anesthesia Postprocedure Evaluation (Signed)
Anesthesia Post Note  Patient: Leroy Hernandez  Procedure(s) Performed: COLONOSCOPY WITH PROPOFOL (N/A ) ESOPHAGOGASTRODUODENOSCOPY (EGD) WITH PROPOFOL (N/A )  Patient location during evaluation: PACU Anesthesia Type: General Level of consciousness: awake and alert Pain management: pain level controlled Vital Signs Assessment: post-procedure vital signs reviewed and stable Respiratory status: spontaneous breathing Cardiovascular status: blood pressure returned to baseline Postop Assessment: no headache Anesthetic complications: no    Jaci Standard, III,  Starlena Beil D

## 2016-12-22 NOTE — Transfer of Care (Signed)
Immediate Anesthesia Transfer of Care Note  Patient: Leroy Hernandez  Procedure(s) Performed: COLONOSCOPY WITH PROPOFOL (N/A ) ESOPHAGOGASTRODUODENOSCOPY (EGD) WITH PROPOFOL (N/A )  Patient Location: PACU  Anesthesia Type: General  Level of Consciousness: awake, alert  and patient cooperative  Airway and Oxygen Therapy: Patient Spontanous Breathing and Patient connected to supplemental oxygen  Post-op Assessment: Post-op Vital signs reviewed, Patient's Cardiovascular Status Stable, Respiratory Function Stable, Patent Airway and No signs of Nausea or vomiting  Post-op Vital Signs: Reviewed and stable  Complications: No apparent anesthesia complications

## 2016-12-22 NOTE — Op Note (Signed)
[facility] Gastroenterology Patient Name: Leroy Hernandez Procedure Date: 12/22/2016 10:24 AM MRN: [id number] Account #: [id number] Date of Birth: Oct 04, 1985 Admit Type: Outpatient Age: 31 Room: [facility] OR ROOM 01 Gender: Male Note Status: Finalized Procedure:            Upper GI endoscopy Indications:          Dyspepsia, Crohn's disease Providers:            Lin Landsman MD, MD Referring MD:         [name]. Ouida Sills MD, MD (Referring MD) Medicines:            Monitored Anesthesia Care Complications:        No immediate complications. Estimated blood loss:                        Minimal. Procedure:            Pre-Anesthesia Assessment:                       - Prior to the procedure, a History and Physical was                        performed, and patient medications and allergies were                        reviewed. The patient is competent. The risks and                        benefits of the procedure and the sedation options and                        risks were discussed with the patient. All questions                        were answered and informed consent was obtained.                        Patient identification and proposed procedure were                        verified by the physician, the nurse, the                        anesthesiologist, the anesthetist and the technician in                        the pre-procedure area in the procedure room. Mental                        Status Examination: alert and oriented. Airway                        Examination: normal oropharyngeal airway and neck                        mobility. Respiratory Examination: clear to                        auscultation. CV Examination: normal. Prophylactic  Antibiotics: The patient does not require prophylactic                        antibiotics. Prior Anticoagulants: The patient has                        taken no previous anticoagulant or  antiplatelet agents.                        ASA Grade Assessment: III - A patient with severe                        systemic disease. After reviewing the risks and                        benefits, the patient was deemed in satisfactory                        condition to undergo the procedure. The anesthesia plan                        was to use monitored anesthesia care (MAC). Immediately                        prior to administration of medications, the patient was                        re-assessed for adequacy to receive sedatives. The                        heart rate, respiratory rate, oxygen saturations, blood                        pressure, adequacy of pulmonary ventilation, and                        response to care were monitored throughout the                        procedure. The physical status of the patient was                        re-assessed after the procedure.                       After obtaining informed consent, the endoscope was                        passed under direct vision. Throughout the procedure,                        the patient's blood pressure, pulse, and oxygen                        saturations were monitored continuously. The Olympus                        GIF H180J Endoscope (S#: [id number]) was introduced  through the mouth, and advanced to the third part of                        duodenum. The upper GI endoscopy was accomplished                        without difficulty. The patient tolerated the procedure                        well. Findings:      The second portion of the duodenum and third portion of the duodenum       were normal. Biopsies were taken with a cold forceps for histology.      Few non-bleeding superficial duodenal ulcers with a clean ulcer base       (Forrest Class III) were found in the duodenal bulb. The largest lesion       was 5 mm in largest dimension. Biopsies were taken with a cold forceps       for  histology.      The entire examined stomach was normal. Biopsies were taken with a cold       forceps for Helicobacter pylori testing.      The cardia and gastric fundus were normal on retroflexion.      The gastroesophageal junction and examined esophagus were normal. Impression:           - Normal second portion of the duodenum and third                        portion of the duodenum. Biopsied.                       - Multiple non-bleeding duodenal ulcers with a clean                        ulcer base (Forrest Class III). Biopsied.                       - Normal stomach. Biopsied.                       - Normal gastroesophageal junction and esophagus. Recommendation:       - Await pathology results.                       - Use Prilosec (omeprazole) 40 mg PO BID.                       - Proceed with colonoscopy as scheduled for today Procedure Code(s):    --- Professional ---                       [phone], Esophagogastroduodenoscopy, flexible, transoral;                        with biopsy, single or multiple Diagnosis Code(s):    --- Professional ---                       K26.9, Duodenal ulcer, unspecified as acute or chronic,  without hemorrhage or perforation                       R10.13, Epigastric pain                       K50.90, Crohn's disease, unspecified, without                        complications CPT copyright 2016 American Medical Association. All rights reserved. The codes documented in this report are preliminary and upon coder review may  be revised to meet current compliance requirements. Dr. Ulyess Mort Lin Landsman MD, MD 12/22/2016 10:50:03 AM This report has been signed electronically. Number of Addenda: 0 Note Initiated On: 12/22/2016 10:24 AM      [facility]

## 2016-12-22 NOTE — Anesthesia Procedure Notes (Signed)
Date/Time: 12/22/2016 10:31 AM Performed by: Cameron Ali, CRNA Pre-anesthesia Checklist: Patient identified, Emergency Drugs available, Suction available, Timeout performed and Patient being monitored Patient Re-evaluated:Patient Re-evaluated prior to induction Oxygen Delivery Method: Nasal cannula Placement Confirmation: positive ETCO2

## 2016-12-22 NOTE — Anesthesia Preprocedure Evaluation (Signed)
Anesthesia Evaluation  Patient identified by MRN, date of birth, ID band Patient awake    Reviewed: Allergy & Precautions, H&P , NPO status , Patient's Chart, lab work & pertinent test results  Airway Mallampati: II  TM Distance: >3 FB Neck ROM: full    Dental no notable dental hx.    Pulmonary asthma ,    Pulmonary exam normal        Cardiovascular negative cardio ROS Normal cardiovascular exam     Neuro/Psych    GI/Hepatic Neg liver ROS, Crohn's disease   Endo/Other  negative endocrine ROS  Renal/GU negative Renal ROS     Musculoskeletal   Abdominal   Peds  Hematology negative hematology ROS (+)   Anesthesia Other Findings   Reproductive/Obstetrics negative OB ROS                             Anesthesia Physical Anesthesia Plan  ASA: II  Anesthesia Plan: General   Post-op Pain Management:    Induction:   PONV Risk Score and Plan:   Airway Management Planned:   Additional Equipment:   Intra-op Plan:   Post-operative Plan:   Informed Consent: I have reviewed the patients History and Physical, chart, labs and discussed the procedure including the risks, benefits and alternatives for the proposed anesthesia with the patient or authorized representative who has indicated his/her understanding and acceptance.     Plan Discussed with:   Anesthesia Plan Comments:         Anesthesia Quick Evaluation

## 2016-12-23 ENCOUNTER — Encounter: Payer: Self-pay | Admitting: Gastroenterology

## 2016-12-27 NOTE — Progress Notes (Signed)
Hematology/Oncology Consult note [facility] Telephone:([phone] [phone]  Patient Care Team: [name], MD as PCP - General (Internal Medicine)   Name of the patient: Leroy Hernandez  [id number]  24-Apr-1985    Reason for referral- anemia   Referring physician- Dr. [name]  Date of visit: 12/27/16   History of presenting illness- Patient is a 31 year old Caucasian male who has been referred to [place] for anemia.  His past medical history significant for Crohn's disease which was diagnosed in 2007 and he is currently seeing Dr. [name] for the same.  He is currently on Cimzia for the same.  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  He recently underwent upper endoscopy on 12/22/2016 which showed multiple nonbleeding duodenal ulcers which were biopsied.  Esophagus and stomach appeared normal.At colonoscopy showed perianal fistula.  Ulcerated mucosa in the neoterminal ileum.  Mucosal ulceration in the colon.  Active ilealcolonic Crohn's disease.  Recent CBC from 12/13/2016 showed white count of 7.7, H&H of 12.9/39.4 with an MCV of 79.1 and a platelet count of 331.  Over the last 1 year his hemoglobin has been between 13-14.  B12 is normal at 397.  TSH was normal at 1.8.  Ferritin levels were low at 10.  And CRP was elevated at 2.5.  Patient is in the process of changing his medications for inflammatory bowel disease.  He continues to have occasional blood in his stools on his toilet paper.  Reports feeling fatigued and sleepy for most part of the day.  2-3 weeks ago he had a bout of loss of appetite and lost about 8-10 pounds of weight which he is gaining back slowly.  Denies any fever.  ECOG PS- 0  Pain scale- 0   Review of systems- Review of Systems  Constitutional: Positive for malaise/fatigue and weight loss. Negative for chills and fever.  HENT: Negative for congestion, ear discharge and nosebleeds.   Eyes: Negative for blurred vision.    Respiratory: Negative for cough, hemoptysis, sputum production, shortness of breath and wheezing.   Cardiovascular: Negative for chest pain, palpitations, orthopnea and claudication.  Gastrointestinal: Positive for blood in stool. Negative for abdominal pain, constipation, diarrhea, heartburn, melena, nausea and vomiting.  Genitourinary: Negative for dysuria, flank pain, frequency, hematuria and urgency.  Musculoskeletal: Negative for back pain, joint pain and myalgias.  Skin: Negative for rash.  Neurological: Negative for dizziness, tingling, focal weakness, seizures, weakness and headaches.  Endo/Heme/Allergies: Does not bruise/bleed easily.  Psychiatric/Behavioral: Negative for depression and suicidal ideas. The patient does not have insomnia.     No Known Allergies  Patient Active Problem List   Diagnosis Date Noted  . Iron deficiency anemia 12/28/2016  . Osteoporosis 12/09/2016  . Generalized abdominal pain   . Perirectal fistula   . Sepsis ([place]) 06/22/2016  . Fracture of metacarpal bone 06/09/2016  . Calculus of gallbladder without cholecystitis without obstruction 12/25/2015  . Gallbladder polyp 12/25/2015  . Blood in stool   . Intestinal bypass or anastomosis status   . Crohn's disease of both small and large intestine with rectal bleeding ([place])   . Crohn's disease ([place]) 08/22/2015  . Perirectal abscess 07/04/2015  . 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 Asthma, mild intermittent 04/02/2015  . Crohn's disease of colon ([place]) 02/06/2015     Past Medical History:  Diagnosis Date  . Anxiety   . Asthma   . Crohn's disease ([place])   . Depression   .  Rectal fistula      Past Surgical History:  Procedure Laterality Date  . COLON RESECTION    . COLON SURGERY    . COLONOSCOPY WITH PROPOFOL N/A 12/22/2016   Performed by Lin Landsman, MD at [place]  . COLONOSCOPY WITH PROPOFOL N/A 12/22/2015   Performed by [name], MD at [place]  . COLONOSCOPY WITH PROPOFOL N/A 04/03/2015   Performed by Hulen Luster, MD at [place]  . ESOPHAGOGASTRODUODENOSCOPY (EGD) WITH PROPOFOL N/A 12/22/2016   Performed by Lin Landsman, MD at [place]  . rectal fistula surgery    . WRIST SURGERY      Social History   Socioeconomic History  . Marital status: Legally Separated    Spouse name: Not on file  . Number of children: Not on file  . Years of education: Not on file  . Highest education level: Not on file  Social Needs  . Financial resource strain: Not on file  . Food insecurity - worry: Not on file  . Food insecurity - inability: Not on file  . Transportation needs - medical: Not on file  . Transportation needs - non-medical: Not on file  Occupational History  . Not on file  Tobacco Use  . Smoking status: Never Smoker  . Smokeless tobacco: Former Network engineer and Sexual Activity  . Alcohol use: Yes    Alcohol/week: 1.2 oz    Types: 2 Cans of beer per week  . Drug use: No  . Sexual activity: Not on file  Other Topics Concern  . Not on file  Social History Narrative  . Not on file     Family History  Problem Relation Age of Onset  . Diabetes Paternal Grandfather   . Heart disease Paternal Grandfather   . Osteoporosis Neg Hx      Current Outpatient Medications:  .  busPIRone (BUSPAR) 5 MG tablet, Take 1 tablet (5 mg total) by mouth 2 (two) times daily., Disp: 60 tablet, Rfl: 0 .  CIMZIA PREFILLED 2 X 200 MG/ML KIT, Inject 1 Dose into the muscle every 14 (fourteen) days. , Disp: , Rfl:  .  ciprofloxacin (CIPRO) 500 MG tablet, Take 1 tablet (500 mg total) 2 (two) times daily by mouth., Disp: 60 tablet, Rfl: 0 .  ergocalciferol (VITAMIN D2) 50000 units capsule, Take 1 capsule (50,000 Units total) by mouth 3 (three) times a week., Disp: 12 capsule, Rfl: 0 .  escitalopram (LEXAPRO) 10 MG tablet, Take 10 mg by mouth daily. , Disp: , Rfl:  .  hydrocortisone  (CORTENEMA) 100 MG/60ML enema, Place 1 enema (100 mg total) at bedtime for 28 days rectally., Disp: 28 enema, Rfl: 0 .  omeprazole (PRILOSEC) 20 MG capsule, Take 1 capsule (20 mg total) by mouth daily before breakfast., Disp: 60 capsule, Rfl: 2 .  ondansetron (ZOFRAN-ODT) 8 MG disintegrating tablet, ondansetron 8 mg disintegrating tablet, Disp: , Rfl:    Physical exam:  Vitals:   12/28/16 0855  BP: 105/68  Pulse: 76  Temp: (!) 96.6 F (35.9 C)  TempSrc: Tympanic  Weight: 131 lb 4.8 oz (59.6 kg)   Physical Exam  Constitutional: He is oriented to person, place, and time.  Thin man in no acute distress  HENT:  Head: Normocephalic and atraumatic.  Eyes: EOM are normal. Pupils are equal, round, and reactive to light.  Neck: Normal range of motion.  Cardiovascular: Normal rate, regular rhythm and normal heart sounds.  Pulmonary/Chest: Effort normal and breath sounds normal.  Abdominal: Soft. Bowel sounds are normal.  Neurological: He is alert and oriented to person, place, and time.  Skin: Skin is warm and dry.       CMP Latest Ref Rng & Units 12/13/2016  Glucose 65 - 99 mg/dL 87  BUN 6 - 20 mg/dL 9  Creatinine 0.61 - 1.24 mg/dL 0.77  Sodium 135 - 145 mmol/L 139  Potassium 3.5 - 5.1 mmol/L 4.0  Chloride 101 - 111 mmol/L 102  CO2 22 - 32 mmol/L 28  Calcium 8.9 - 10.3 mg/dL 8.8(L)  Total Protein 6.5 - 8.1 g/dL 7.2  Total Bilirubin 0.3 - 1.2 mg/dL 1.2  Alkaline Phos 38 - 126 U/L 62  AST 15 - 41 U/L 15  ALT 17 - 63 U/L 11(L)   CBC Latest Ref Rng & Units 12/13/2016  WBC 3.8 - 10.6 K/uL 7.7  Hemoglobin 13.0 - 18.0 g/dL 12.9(L)  Hematocrit 40.0 - 52.0 % 39.4(L)  Platelets 150 - 440 K/uL 331     Assessment and plan- Patient is a 31 y.o. male with mild microcytic anemia secondary to iron deficiency as well as anemia of chronic disease.  Given that he did have evidence of low ferritin 2 weeks ago I will proceed with 2 doses of Feraheme 510 mg IV weekly.  Discussed risks and  benefits of Feraheme including all but not limited to fatigue, headaches, leg swelling, risk of infusion reactions.  Patient understands and agrees to proceed as planned.  I will repeat his CBC with differential along with ferritin and iron studies and b12 2 months from now   Thank you for this kind referral and the opportunity to participate in the care of this patient   Visit Diagnosis 1. Microcytic anemia   2. Iron deficiency anemia, unspecified iron deficiency anemia type     Dr. Randa Evens, MD, MPH [facility] Pager- [phone] 12/27/2016  9:13 AM

## 2016-12-28 ENCOUNTER — Inpatient Hospital Stay: Payer: BC Managed Care – PPO | Attending: Oncology | Admitting: Oncology

## 2016-12-28 ENCOUNTER — Other Ambulatory Visit: Payer: Self-pay

## 2016-12-28 ENCOUNTER — Encounter: Payer: Self-pay | Admitting: Oncology

## 2016-12-28 ENCOUNTER — Encounter: Payer: Self-pay | Admitting: Gastroenterology

## 2016-12-28 VITALS — BP 105/68 | HR 76 | Temp 96.6°F | Wt 131.3 lb

## 2016-12-28 DIAGNOSIS — Z79899 Other long term (current) drug therapy: Secondary | ICD-10-CM | POA: Diagnosis not present

## 2016-12-28 DIAGNOSIS — J4520 Mild intermittent asthma, uncomplicated: Secondary | ICD-10-CM | POA: Insufficient documentation

## 2016-12-28 DIAGNOSIS — F419 Anxiety disorder, unspecified: Secondary | ICD-10-CM | POA: Diagnosis not present

## 2016-12-28 DIAGNOSIS — F330 Major depressive disorder, recurrent, mild: Secondary | ICD-10-CM | POA: Insufficient documentation

## 2016-12-28 DIAGNOSIS — K8020 Calculus of gallbladder without cholecystitis without obstruction: Secondary | ICD-10-CM | POA: Insufficient documentation

## 2016-12-28 DIAGNOSIS — D509 Iron deficiency anemia, unspecified: Secondary | ICD-10-CM

## 2016-12-28 DIAGNOSIS — K50811 Crohn's disease of both small and large intestine with rectal bleeding: Secondary | ICD-10-CM | POA: Insufficient documentation

## 2016-12-28 DIAGNOSIS — F1010 Alcohol abuse, uncomplicated: Secondary | ICD-10-CM | POA: Diagnosis not present

## 2016-12-28 DIAGNOSIS — K269 Duodenal ulcer, unspecified as acute or chronic, without hemorrhage or perforation: Secondary | ICD-10-CM | POA: Diagnosis not present

## 2016-12-28 HISTORY — DX: Iron deficiency anemia, unspecified: D50.9

## 2017-01-04 ENCOUNTER — Inpatient Hospital Stay: Payer: BC Managed Care – PPO

## 2017-01-05 ENCOUNTER — Telehealth: Payer: Self-pay

## 2017-01-05 NOTE — Telephone Encounter (Signed)
Pts rx for Remicade is available for pick up at [facility] [phone].  LVM to let him know that it has been delivered there.  Thanks [employer]

## 2017-01-06 ENCOUNTER — Inpatient Hospital Stay: Payer: BC Managed Care – PPO

## 2017-01-06 ENCOUNTER — Telehealth: Payer: Self-pay

## 2017-01-06 VITALS — BP 106/68 | HR 74 | Temp 96.9°F | Resp 18

## 2017-01-06 DIAGNOSIS — D509 Iron deficiency anemia, unspecified: Secondary | ICD-10-CM | POA: Diagnosis not present

## 2017-01-06 MED ORDER — SODIUM CHLORIDE 0.9 % IV SOLN
Freq: Once | INTRAVENOUS | Status: AC
  Administered 2017-01-06: 12:00:00 via INTRAVENOUS
  Filled 2017-01-06: qty 1000

## 2017-01-06 MED ORDER — SODIUM CHLORIDE 0.9 % IV SOLN
510.0000 mg | Freq: Once | INTRAVENOUS | Status: AC
  Administered 2017-01-06: 510 mg via INTRAVENOUS
  Filled 2017-01-06: qty 17

## 2017-01-06 NOTE — Telephone Encounter (Signed)
Contacted pt regarding his medications.  Informed him that his infusions will be taking place at [facility].  Will be sending office notes, labs, rx and instructions, authorization info to Robin to schedule infusion.  Thanks [employer]

## 2017-01-10 ENCOUNTER — Encounter: Payer: Self-pay | Admitting: Gastroenterology

## 2017-01-10 ENCOUNTER — Inpatient Hospital Stay: Payer: BC Managed Care – PPO | Attending: Oncology

## 2017-01-10 ENCOUNTER — Ambulatory Visit (INDEPENDENT_AMBULATORY_CARE_PROVIDER_SITE_OTHER): Payer: BC Managed Care – PPO | Admitting: Gastroenterology

## 2017-01-10 ENCOUNTER — Other Ambulatory Visit: Payer: Self-pay

## 2017-01-10 VITALS — BP 109/70 | HR 89 | Temp 98.3°F | Ht 65.0 in | Wt 128.6 lb

## 2017-01-10 VITALS — BP 115/76 | HR 84 | Temp 96.8°F | Resp 18

## 2017-01-10 DIAGNOSIS — K269 Duodenal ulcer, unspecified as acute or chronic, without hemorrhage or perforation: Secondary | ICD-10-CM | POA: Diagnosis not present

## 2017-01-10 DIAGNOSIS — Z79899 Other long term (current) drug therapy: Secondary | ICD-10-CM | POA: Diagnosis not present

## 2017-01-10 DIAGNOSIS — K50811 Crohn's disease of both small and large intestine with rectal bleeding: Secondary | ICD-10-CM | POA: Diagnosis not present

## 2017-01-10 DIAGNOSIS — K50812 Crohn's disease of both small and large intestine with intestinal obstruction: Secondary | ICD-10-CM

## 2017-01-10 DIAGNOSIS — K8020 Calculus of gallbladder without cholecystitis without obstruction: Secondary | ICD-10-CM | POA: Insufficient documentation

## 2017-01-10 DIAGNOSIS — F330 Major depressive disorder, recurrent, mild: Secondary | ICD-10-CM | POA: Diagnosis not present

## 2017-01-10 DIAGNOSIS — F1010 Alcohol abuse, uncomplicated: Secondary | ICD-10-CM | POA: Insufficient documentation

## 2017-01-10 DIAGNOSIS — F419 Anxiety disorder, unspecified: Secondary | ICD-10-CM | POA: Diagnosis not present

## 2017-01-10 DIAGNOSIS — D509 Iron deficiency anemia, unspecified: Secondary | ICD-10-CM | POA: Insufficient documentation

## 2017-01-10 DIAGNOSIS — J4520 Mild intermittent asthma, uncomplicated: Secondary | ICD-10-CM | POA: Diagnosis not present

## 2017-01-10 MED ORDER — SODIUM CHLORIDE 0.9 % IV SOLN
510.0000 mg | Freq: Once | INTRAVENOUS | Status: AC
  Administered 2017-01-10: 510 mg via INTRAVENOUS
  Filled 2017-01-10: qty 17

## 2017-01-10 MED ORDER — INFLIXIMAB 100 MG IV SOLR: INTRAVENOUS | 3 refills | Status: DC

## 2017-01-10 MED ORDER — FOLIC ACID 1 MG PO TABS: 1.0000 mg | ORAL_TABLET | Freq: Every day | ORAL | 1 refills | Status: AC

## 2017-01-10 MED ORDER — METHOTREXATE 2.5 MG PO TABS: 25.0000 mg | ORAL_TABLET | ORAL | 2 refills | Status: DC

## 2017-01-10 MED ORDER — BUDESONIDE 3 MG PO CPEP: 9.0000 mg | ORAL_CAPSULE | Freq: Every day | ORAL | 1 refills | Status: AC

## 2017-01-10 MED ORDER — SODIUM CHLORIDE 0.9 % IV SOLN
Freq: Once | INTRAVENOUS | Status: AC
  Administered 2017-01-10: 15:00:00 via INTRAVENOUS
  Filled 2017-01-10: qty 1000

## 2017-01-10 NOTE — Patient Instructions (Signed)
1. Stop imuran 2. Start methotrexate 40m weekly 3. Start folic acid 128mdaily and 27m20mn day of methotrexate 4. Cimzia will be last dose today 5. Continue cipro 500m21mD 6. Continue entcort 3mg 69mlls daily  Please call our office to speak with my nurse MicheDriscilla [phone] business hours from 8am to 4pm if you have any questions/concerns. During after hours, you will be redirected to on call GI physician. For any emergency please call 911 or go the nearest emergency room.    RohinCephas Darby1248 [address] BayliRoslyn [phone]: [phone]: [phone]

## 2017-01-10 NOTE — Progress Notes (Signed)
we'll  Leroy Darby, MD [address]  [place]  [place], [place] [address]  Main: [phone]  Fax: [phone]    Gastroenterology Consultation  Referring Provider:     [name], MD Primary Care Physician:  [name], MD Primary Gastroenterologist:  Dr. Cephas Hernandez Reason for Consultation:     Crohn's disease        HPI:   COSTANTINO Hernandez is a 31 y.o. y/o male referred by Dr. Ouida Sills, [name], MD  for consultation & management of Crohn's disease. He has history of ileocolonic Crohn's diagnosed in 2007, detailed history described below is referred by [name] to establish care. He is currently on cymzia 400 mg every 2 weeks and Imuran 150 mg daily. Currently, he is experiencing nonbloody diarrhea up to 10 times a day associated with urgency and perianal purulent discharge from the fistula. He also reports tenderness in the right perianal area. He reports left-sided abdominal pain associated with nausea but no vomiting. He denies bloating, blood in the stools. He went to [facility] ER 2 days ago due to severe diarrhea and head is given prescription for oral prednisone but he did not filling the prescription yet. He denies fever, chills, loss of appetite, weight loss. He has been adherent with his medications.   Follow up visit 01/10/2017: Since last visit, I started him on entocort, cipro, cortenema for flare up of his symptoms. He gained about 5lbs since last visit. 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 Remicade process was started. He reports some rectal urgency and minimal rectal bleeding mostly on wiping. When I went over medications with him, he doesn't really know what he is taking and he did not bring his medication bottles with him even though I reminded him during last visit. He is not on entocort. He has seen endocrine and also hematology for iron deficiency anemia and scheduled for iron  infusion today  Crohn's disease classification:  Age: 64 to 64 Location: ileocolonic  Behavior: stricturing and penetrating  Perianal: Yes  IBD diagnosis:2007  Disease course: He was diagnosed in 2007, was experiencing right lower quadrant pain and bloating, was on oral mesalamine, several courses of prednisone, underwent first surgery in 2011 of transverse colon resection at [facility]. He continued to have diarrhea and required repeated courses of prednisone, established care at [facility] and underwent colonoscopy which revealed severe ileocolonic disease, underwent ileocolonic resection in 11/2011. He reports being started on Humira and Imuran since then and was doing well for some time. He was also taking marijuana and it significantly helped with his symptoms. Subsequently, he had frequent flareups, several hospitalizations and ER visits almost every month secondary to perianal fistula, abscess and bloody diarrhea. He was receiving short courses of antibiotics. He had I&D of right perirectal abscesses in May/2017. He does not recall having a seton placement. His colonoscopy in 03/2015 revealed mild active colitis and anastomotic stricture that was dilated with balloon to 12 mm. He underwent another colonoscopy in 12/2015 which revealed inflammation in the neoterminal ileum as well as colon and was found to have moderately active ileocolonic disease. He was then switched to [place] in 05/2016 and stayed on Imuran 150 mg daily. He was feeling better overall but continued to have active perianal disease resulting in urgency and bloody stools. CT A/P from 06/2016 revealed right perirectal fistula with developing fluid collection concerning for abscess. MRE from 09/2016 revealed no  evidence of active disease, however the fistula could not be visualized. His CRP improved from 10 in 06/2016 -1.7 in 09/2016. 09/2016 Trial of cipro+flagyl BID and rowasa enema resulted in significant improvement of  symptoms. 11/2016 Return of fistula and rectal bleeding after tapering antibiotic. 12/2016 Restarted cipro, entocort, cortenema, decreased imuran to 39m. EGD and colonoscopy revealed gastric granuloma, Rutgeert's i4. Stool studies negative. Fecal calprotectin 370  Extra intestinal manifestations: Imaging revealed chronic sacroiliitis. He denies skin, eye manifestations  IBD surgical history: 2011 : Intestinal resection, transverse colon Diagnosis:  TRANSVERSE COLON AND PROXIMAL TRANSVERSE COLON RESECTION:  SEGMENTS OF BOWEL WITH MODERATE TO FOCALLY SEVERE CHRONIC ACTIVE COLITIS.  EXTENSIVE TRANSMURAL INFLAMMATION.  CRYTPITIS AND CRYPT ABSCESSES ARE NOTED.  THE RESECTION MARGINS APPEAR CLEAR.  THERE IS NO EVIDENCE OF DYSPLASIA OR MALIGNANCY.  NINETEEN BENIGN LYMPH NODES. (0/19)  [facility]- 9 cm TI, 5.5 cm long large bowel Diagnosis:  A:Ileum and colon, resection  -Segment of ileum and colon with moderate to severely active chronic  enteritis, mildly active chronic colitis, and intact anastomosis at distal end  of specimen  -Surgical margins are viable; mildly active chronic colitis is present at  distal margin  -Apparent stricture near ileocecal valve  -Inflammatory polyp near ileocecal valve  -Fibrous obliteration of appendiceal lumen  -Six unremarkable lymph nodes examined microscopically  -No granulomas, viral cytopathic effect, or dysplasia identified   07/04/2015-incision and drainage of right perirectal abscess Imaging:  MRE-09/21/2016 IMPRESSION: 1. Stable surgical changes from prior colon resection and ileal colonic anastomosis. No findings for active Crohn's disease. 2. No acute abdominal/pelvic findings, mass lesions or adenopathy.  CT A/P 06/22/2016 IMPRESSION: 1. The fistula between the right lateral aspect of the rectum and the skin is again identified. The portion of the fistula  immediately beneath the skin is more prominent in caliber in the interval with central decreased attenuation worrisome for a developing fluid collection in the distal fistula immediately beneath the cutaneous surface. A developing abscess cannot be excluded. Recommend clinical correlation. 2. Fluid throughout the remaining colon consistent with history of diarrhea. No colonic or small bowel inflammation identified. The rectum is poorly evaluated due to lack of distention. 3. 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  SBFT none  Procedures: Colonoscopy 09/2009 Diagnosis:  TRANSVERSE COLON MUCOSA COLD BIOPSY:  - MODERATE CHRONIC ACTIVE COLITIS, CONSISTENT WITH PATIENTS  HISTORY OF CROHNS DISEASE.  - NEGATIVE FOR DYSPLASIA AND MALIGNANCY.   Colonoscopy 06/2010 Diagnosis:  ULCERATED RIGHT SIDED COLON BIOPSY:  - MILD CHRONIC ACTIVE COLITIS, COMPATIBLE WITH PATIENTS KNOWN  HISTORY OF CROHNS DISEASE.  - NEGATIVE FOR DYSPLASIA AND MALIGNANCY.   Colonoscopies 07/2012 Diagnosis:  ANASTOMOTIC STRICTURE COLD BIOPSY:  - MILD CHRONIC ACTIVE COLITIS, COMPATIBLE WITH PATIENTS KNOWN  HISTORY OF CROHNS DISEASE.  - NEGATIVE FOR DYSPLASIA AND MALIGNANCY.   Colonoscopies 07/2013 Diagnosis:  ILEUM BIOPSY:  - SMALL BOWEL MUCOSA WITH PRESERVED VILLOUS ARCHITECTURE.  - NEGATIVE FOR INTRAEPITHELIAL LYMPHOCYTOSIS, DYSPLASIA AND  MALIGNANCY.   Colonoscopy 03/2015 Multiple ulcers in the sigmoid colon, no bleeding was present biopsies were taken, tight stricture at the ileocolonic anastomosis unable to get through scope, dilated with 12 mm TTS balloon. The terminal ileum appeared normal DIAGNOSIS:  A. SIGMOID COLON; COLD BIOPSY:  - MILD CHRONIC ACTIVE COLITIS.  - NEGATIVE FOR DYSPLASIA AND MALIGNANCY.   Colonoscopy 12/2015 There was evidence of prior end-to-end ileocolonic anastomosis in the transverse colon. This was characterized by mild stenosis.  The anastomosis  was traversed. Scattered inflammation, moderate in severity and characterized by shallow ulcerations was found in the proximal ileum. Biopsies were taken with the cold forceps for histology. Inflammation characterized by shallow ulcerations were found no sites were spared. This was moderate in severity. Biopsies were taken. Perianal fistula found on perianal exam. Pathology: Small intestine-severe chronic active ileitis with ulceration. 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 - Patent functional end-to-end ileo-colonic anastomosis, characterized by mild stenosis. Dilated to 45m. - Ulcerated mucosa in the neo-terminal ileum. Biopsied. - Mucosal ulceration in colon. Biopsied. - Active ileocolonic Crohn's disease, Rutgeert's i4 on cimzia and imuran - The distal rectum and anal verge are normal on retroflexion view.  Upper Endoscopy 12/2016 - Normal second portion of the duodenum and third portion of the duodenum. Biopsied. - Multiple non-bleeding duodenal ulcers with a clean ulcer base (Forrest Class III). Biopsied. - Normal stomach. Biopsied. - Normal gastroesophageal junction and esophagus.   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  Cimzia started in 05/28/2016 along with Imuran. Anti Integrins:  Ustekinumab: Tofactinib: Clinical trial:   Past Medical History:  Diagnosis Date  . Anxiety   . Asthma   . Crohn's disease ([place]  . Depression   . Rectal fistula     Past Surgical History:  Procedure Laterality Date  . COLON RESECTION    . COLON SURGERY    . COLONOSCOPY WITH PROPOFOL N/A 04/03/2015   Procedure: COLONOSCOPY WITH PROPOFOL;  Surgeon: PaHulen LusterMD;  [facility];  Service: Endoscopy;  Laterality: N/A;  . COLONOSCOPY WITH PROPOFOL N/A 12/22/2015   Procedure: COLONOSCOPY WITH PROPOFOL;  Surgeon: DaLucilla LameMD;  [place]: [facility] Service: Endoscopy;  Laterality: N/A;  . COLONOSCOPY WITH PROPOFOL N/A 12/22/2016   Procedure: COLONOSCOPY WITH PROPOFOL;  Surgeon: VaLin LandsmanMD;  [place] [facility] Service: Endoscopy;  Laterality: N/A;  . ESOPHAGOGASTRODUODENOSCOPY (EGD) WITH PROPOFOL N/A 12/22/2016   Procedure: ESOPHAGOGASTRODUODENOSCOPY (EGD) WITH PROPOFOL;  Surgeon: VaLin LandsmanMD;  [facility] Service: Endoscopy;  Laterality: N/A;  . rectal fistula surgery    . WRIST SURGERY       Current Outpatient Medications:  .  busPIRone (BUSPAR) 5 MG tablet, Take 1 tablet (5 mg total) by mouth 2 (two) times daily., Disp: 60 tablet, Rfl: 0 .  CIMZIA PREFILLED 2 X 200 MG/ML KIT, Inject 1 Dose into the muscle every 14 (fourteen) days. , Disp: , Rfl:  .  escitalopram (LEXAPRO) 10 MG tablet, Take 10 mg by mouth daily. , Disp: , Rfl:  .  hydrocortisone (CORTENEMA) 100 MG/60ML enema, Place 1 enema (100 mg total) at bedtime for 28 days rectally., Disp: 28 enema, Rfl: 0 .  ondansetron (ZOFRAN-ODT) 8 MG disintegrating tablet, ondansetron 8 mg disintegrating tablet, Disp: , Rfl:  .  budesonide (ENTOCORT  EC) 3 MG 24 hr capsule, Take 3 capsules (9 mg total) by mouth daily., Disp: 90 capsule, Rfl: 1 .  folic acid (FOLVITE) 1 MG tablet, Take 1 tablet  (1 mg total) by mouth daily. Take 2pills on day when you take methotrexate, Disp: 90 tablet, Rfl: 1 .  inFLIXimab (REMICADE) 100 MG injection, Start 38m/kg IV on weeks 0,2,6 then continue every 8 weeks thereafter (Patient not taking: Reported on 01/10/2017), Disp: 1 each, Rfl: 3 .  methotrexate (RHEUMATREX) 2.5 MG tablet, Take 10 tablets (25 mg total) by mouth once a week. Caution:Chemotherapy. Protect from light., Disp: 40 tablet, Rfl: 2 .  omeprazole (PRILOSEC) 20 MG capsule, Take 1 capsule (20 mg total) by mouth daily before breakfast., Disp: 60 capsule, Rfl: 2   Family History  Problem Relation Age of Onset  . Diabetes Paternal Grandfather   . Heart disease Paternal Grandfather   . Osteoporosis Neg Hx      Social History   Tobacco Use  . Smoking status: Never Smoker  . Smokeless tobacco: Former UNetwork engineerUse Topics  . Alcohol use: Yes    Alcohol/week: 1.2 oz    Types: 2 Cans of beer per week  . Drug use: No    Allergies as of 01/10/2017  . (No Known Allergies)    Review of Systems:    All systems reviewed and negative except where noted in HPI.   Physical Exam:  BP 109/70   Pulse 89   Temp 98.3 F (36.8 C) (Oral)   Ht 5' 5"  (1.651 m)   Wt 128 lb 9.6 oz (58.3 kg)   BMI 21.40 kg/m  No LMP for male patient. Filed Weights   01/10/17 1319  Weight: 128 lb 9.6 oz (58.3 kg)   General:   Alert,  Thin bulit, pleasant and cooperative in NAD Head:  Normocephalic and atraumatic. Eyes:  Sclera clear, no icterus.   Conjunctiva pale. Ears:  Normal auditory acuity. Nose:  No deformity, discharge, or lesions. Mouth:  No deformity or lesions,oropharynx pink & moist. Neck:  Supple; no masses or thyromegaly. Lungs:  Respirations even and unlabored.  Clear throughout to auscultation.   No wheezes, crackles, or rhonchi. No acute distress. Heart:  Regular rate and rhythm; no murmurs, clicks, rubs, or gallops. Abdomen:  Normal bowel sounds.  No bruits.  Soft, non-tender and  non-distended without masses, hepatosplenomegaly or hernias noted.  No guarding or rebound tenderness.  Midline vertical scar from prior surgery Rectal: Not performed Msk:  Symmetrical without gross deformities. Good, equal movement & strength bilaterally. Pulses:  Normal pulses noted. Extremities:  No clubbing or edema.  No cyanosis. Neurologic:  Alert and oriented x3;  grossly normal neurologically. Skin:  Intact without significant lesions or rashes. No jaundice. Psych:  Alert and cooperative. Normal mood and affect.   Assessment and Plan:   TACHILLE XIANGis a 31y.o. y/o male with Ileocolonic Crohn's disease diagnosed in 2007, stricturing and penetrating with perianal fistula, previously on Humira and Imuran, recurrence of postoperative Crohn's, resulted in discontinuation of Humira. [place] to [place] biweekly and continued Imuran 150 mg daily which resulted in clinical remission followed by recent flare with recurrence of perianal fistula that responded to antibiotics, then recurrence of symptoms after tapering abx, responded to entocort, EGD and colonoscopy 12/2016 revealed gastric crohn's and post op recurrence of Crohn's Rutgeert's i4  Crohn's disease: 1. Stop imuran 2. Start methotrexate 274mpo weekly, follow cbc and LFTs every 38m46month  3. Start folic acid 31m daily and 262mon day of methotrexate 4. Cimzia will be last dose today 5. Continue cipro 5005mID 6. Continue entcort 3mg18mills daily 7. Start remicade 8. Encouraged him to increase dietary intake as well as protein drinks such as Ensure 2 times daily between meals 9. Asked him to bring all his meds during next visit   IBD Health Maintenance  1.TB status: PPD skin test negative as of October, 2017, recheck during next visit 2. Anemia: has iron deficiency anemia secondary to active Crohn's disease. Receiving IV iron by Dr Rao Janese Banksamin B12 is 397 3.Immunizations: Hep A and B not immune, recommend Twinrix  vaccine by his PCP. Influenza-receives annual influenza vaccine, last received in October/2017, he is due for prevnar and pneumovax 4.Cancer screening I) Colon cancer/dysplasia surveillance: Last colonoscopy in 12/2016, no dysplasia II) Cervical cancer: N/A III) Skin cancer - counseled about annual skin exam by dermatology and skin protection in summer using sun screen SPF > 50, clothing 5.Bone health: Has osteoporosis, being followed by endocrine Vitamin D status: Low vitamin D levels, on vit D 50K  Bone density testing: DEXA scan 09/29/2016 revealed osteoporosis, Z-score -3.3 5. Labs: every 3mon63monthSmoking: Never smoker 7. NSAIDs and Antibiotics use: No history of NSAID use  Follow up in 2 months or sooner     RohinCephas Hernandez

## 2017-01-11 ENCOUNTER — Ambulatory Visit: Payer: BC Managed Care – PPO

## 2017-01-21 ENCOUNTER — Telehealth: Payer: Self-pay | Admitting: Gastroenterology

## 2017-01-21 NOTE — Telephone Encounter (Signed)
Please call Elmyra Ricks as she has some medication questions for you regarding [name].

## 2017-02-15 ENCOUNTER — Encounter: Payer: Self-pay | Admitting: Gastroenterology

## 2017-02-28 ENCOUNTER — Telehealth: Payer: Self-pay | Admitting: Gastroenterology

## 2017-02-28 DIAGNOSIS — K50111 Crohn's disease of large intestine with rectal bleeding: Secondary | ICD-10-CM

## 2017-02-28 DIAGNOSIS — D500 Iron deficiency anemia secondary to blood loss (chronic): Secondary | ICD-10-CM

## 2017-02-28 MED ORDER — METHOTREXATE SODIUM 5 MG PO TABS: 25.0000 mg | ORAL_TABLET | ORAL | 0 refills | Status: DC

## 2017-02-28 NOTE — Telephone Encounter (Signed)
Patient did not answer my chart miss age and phone. Called patient's fianc to check on how he is doing. He received his first Remicade infusion 2 weeks ago and has tolerated it well. He is also taking methotrexate 25 mg weekly along with folate acid daily. According to her, he is overall doing better. He did not undergo any labs during his first Remicade infusion  I will check CBC, CMP, ferritin, B12 levels and recheck every 2 weeks for next 3 months I will also see him next month for follow-up  [name], MD [address]  [place]  [place], [place] [address]  Main: [phone]  Fax: [phone] Pager: [phone]

## 2017-02-28 NOTE — Telephone Encounter (Signed)
-----   Message from Lin Landsman, MD sent at 02/28/2017 11:58 AM EST ----- Regarding: Labs and clinic follow-up I ordered some labs, please ask patient to go to the lab tomorrow. Also, please make a follow-up appointment with me in next 3 weeks. Please see my telephone note from today  Sherri Sear, MD

## 2017-03-01 ENCOUNTER — Inpatient Hospital Stay: Payer: BC Managed Care – PPO | Admitting: Oncology

## 2017-03-01 ENCOUNTER — Inpatient Hospital Stay: Payer: BC Managed Care – PPO

## 2017-03-02 ENCOUNTER — Other Ambulatory Visit
Admission: RE | Admit: 2017-03-02 | Discharge: 2017-03-02 | Disposition: A | Discharge status: Home / Self Care | Payer: BC Managed Care – PPO | Source: Ambulatory Visit | Attending: Gastroenterology | Admitting: Gastroenterology

## 2017-03-02 DIAGNOSIS — K50111 Crohn's disease of large intestine with rectal bleeding: Secondary | ICD-10-CM | POA: Insufficient documentation

## 2017-03-02 DIAGNOSIS — D500 Iron deficiency anemia secondary to blood loss (chronic): Secondary | ICD-10-CM | POA: Insufficient documentation

## 2017-03-02 DIAGNOSIS — D509 Iron deficiency anemia, unspecified: Secondary | ICD-10-CM | POA: Diagnosis present

## 2017-03-02 LAB — COMPREHENSIVE METABOLIC PANEL
ALK PHOS: 96 U/L (ref 38–126)
ALT: 29 U/L (ref 17–63)
ANION GAP: 8 (ref 5–15)
AST: 22 U/L (ref 15–41)
Albumin: 4.2 g/dL (ref 3.5–5.0)
BUN: 11 mg/dL (ref 6–20)
CALCIUM: 9.2 mg/dL (ref 8.9–10.3)
CHLORIDE: 104 mmol/L (ref 101–111)
CO2: 27 mmol/L (ref 22–32)
Creatinine, Ser: 0.94 mg/dL (ref 0.61–1.24)
GFR calc Af Amer: >60 mL/min (ref >60)
GFR calc non Af Amer: >60 mL/min (ref >60)
GLUCOSE: 103 mg/dL — AB (ref 65–99)
Potassium: 3.9 mmol/L (ref 3.5–5.1)
SODIUM: 139 mmol/L (ref 135–145)
Total Bilirubin: 1.1 mg/dL (ref 0.3–1.2)
Total Protein: 7.5 g/dL (ref 6.5–8.1)

## 2017-03-02 LAB — CBC WITH DIFFERENTIAL/PLATELET
BASOS PCT: 1 %
Basophils Absolute: 0 10*3/uL (ref 0–0.1)
Eosinophils Absolute: 0.1 10*3/uL (ref 0–0.7)
Eosinophils Relative: 1 %
HEMATOCRIT: 45.6 % (ref 40.0–52.0)
HEMOGLOBIN: 15 g/dL (ref 13.0–18.0)
LYMPHS ABS: 1.3 10*3/uL (ref 1.0–3.6)
Lymphocytes Relative: 17 %
MCH: 27.7 pg (ref 26.0–34.0)
MCHC: 32.9 g/dL (ref 32.0–36.0)
MCV: 84.1 fL (ref 80.0–100.0)
MONO ABS: 0.3 10*3/uL (ref 0.2–1.0)
MONOS PCT: 5 %
NEUTROS ABS: 5.9 10*3/uL (ref 1.4–6.5)
Neutrophils Relative %: 76 %
Platelets: 205 10*3/uL (ref 150–440)
RBC: 5.42 MIL/uL (ref 4.40–5.90)
RDW: 17.1 % — AB (ref 11.5–14.5)
WBC: 7.6 10*3/uL (ref 3.8–10.6)

## 2017-03-02 LAB — IRON AND TIBC
IRON: 112 ug/dL (ref 45–182)
Saturation Ratios: 27 % (ref 17.9–39.5)
TIBC: 423 ug/dL (ref 250–450)
UIBC: 311 ug/dL

## 2017-03-02 LAB — FERRITIN: Ferritin: 84 ng/mL (ref 24–336)

## 2017-03-02 LAB — C-REACTIVE PROTEIN

## 2017-03-02 LAB — VITAMIN B12: Vitamin B-12: 378 pg/mL (ref 180–914)

## 2017-03-05 ENCOUNTER — Other Ambulatory Visit: Payer: Self-pay

## 2017-03-05 ENCOUNTER — Emergency Department
Admission: EM | Admit: 2017-03-05 | Discharge: 2017-03-05 | Disposition: A | Discharge status: Home / Self Care | Payer: BC Managed Care – PPO | Attending: Emergency Medicine | Admitting: Emergency Medicine

## 2017-03-05 DIAGNOSIS — J45909 Unspecified asthma, uncomplicated: Secondary | ICD-10-CM | POA: Diagnosis not present

## 2017-03-05 DIAGNOSIS — K5090 Crohn's disease, unspecified, without complications: Secondary | ICD-10-CM | POA: Insufficient documentation

## 2017-03-05 DIAGNOSIS — R109 Unspecified abdominal pain: Secondary | ICD-10-CM | POA: Diagnosis present

## 2017-03-05 DIAGNOSIS — Z79899 Other long term (current) drug therapy: Secondary | ICD-10-CM | POA: Insufficient documentation

## 2017-03-05 DIAGNOSIS — R1084 Generalized abdominal pain: Secondary | ICD-10-CM | POA: Diagnosis not present

## 2017-03-05 LAB — CBC
HEMATOCRIT: 46.8 % (ref 40.0–52.0)
HEMOGLOBIN: 15.5 g/dL (ref 13.0–18.0)
MCH: 27.6 pg (ref 26.0–34.0)
MCHC: 33 g/dL (ref 32.0–36.0)
MCV: 83.6 fL (ref 80.0–100.0)
Platelets: 233 10*3/uL (ref 150–440)
RBC: 5.6 MIL/uL (ref 4.40–5.90)
RDW: 16.6 % — ABNORMAL HIGH (ref 11.5–14.5)
WBC: 11.9 10*3/uL — ABNORMAL HIGH (ref 3.8–10.6)

## 2017-03-05 LAB — URINALYSIS, COMPLETE (UACMP) WITH MICROSCOPIC
BACTERIA UA: NONE SEEN
Bilirubin Urine: NEGATIVE
Glucose, UA: NEGATIVE mg/dL
Hgb urine dipstick: NEGATIVE
KETONES UR: 20 mg/dL — AB
Leukocytes, UA: NEGATIVE
Nitrite: NEGATIVE
PROTEIN: NEGATIVE mg/dL
Specific Gravity, Urine: 1.026 (ref 1.005–1.030)
pH: 5 (ref 5.0–8.0)

## 2017-03-05 LAB — COMPREHENSIVE METABOLIC PANEL
ALBUMIN: 4.2 g/dL (ref 3.5–5.0)
ALK PHOS: 99 U/L (ref 38–126)
ALT: 20 U/L (ref 17–63)
ANION GAP: 8 (ref 5–15)
AST: 20 U/L (ref 15–41)
BUN: 9 mg/dL (ref 6–20)
CHLORIDE: 104 mmol/L (ref 101–111)
CO2: 27 mmol/L (ref 22–32)
Calcium: 9.1 mg/dL (ref 8.9–10.3)
Creatinine, Ser: 0.85 mg/dL (ref 0.61–1.24)
GFR calc non Af Amer: >60 mL/min (ref >60)
GLUCOSE: 112 mg/dL — AB (ref 65–99)
Potassium: 3.8 mmol/L (ref 3.5–5.1)
Sodium: 139 mmol/L (ref 135–145)
Total Bilirubin: 1.4 mg/dL — ABNORMAL HIGH (ref 0.3–1.2)
Total Protein: 7.5 g/dL (ref 6.5–8.1)

## 2017-03-05 LAB — LIPASE, BLOOD: Lipase: 28 U/L (ref 11–51)

## 2017-03-05 MED ORDER — SODIUM CHLORIDE 0.9 % IV SOLN
1000.0000 mL | Freq: Once | INTRAVENOUS | Status: AC
  Administered 2017-03-05: 1000 mL via INTRAVENOUS

## 2017-03-05 MED ORDER — ONDANSETRON HCL 4 MG/2ML IJ SOLN
4.0000 mg | Freq: Once | INTRAMUSCULAR | Status: AC
  Administered 2017-03-05: 4 mg via INTRAVENOUS
  Filled 2017-03-05: qty 2

## 2017-03-05 MED ORDER — MORPHINE SULFATE (PF) 2 MG/ML IV SOLN
2.0000 mg | Freq: Once | INTRAVENOUS | Status: AC
  Administered 2017-03-05: 2 mg via INTRAVENOUS
  Filled 2017-03-05: qty 1

## 2017-03-05 NOTE — ED Triage Notes (Signed)
Pt states he has Chron's disease and ate almonds yesterday - this is causing him to have abd pain (abd is soft and non-distended) - pt states he just started new medication for the disease and this may be causing the problem

## 2017-03-05 NOTE — ED Provider Notes (Signed)
[facility] Emergency Department Provider Note   ____________________________________________    I have reviewed the triage vital signs and the nursing notes.   HISTORY  Chief Complaint Abdominal Pain     HPI Leroy Hernandez is a 32 y.o. male who present with complaints of abdominal pain.  Patient has a history of Crohn's disease with a rectal fistula, chronic.  Recently started on new infusion for Crohn's disease, reports he has been doing relatively well until yesterday when he ate an entire bag of almonds.  Later that day he developed abdominal cramping primarily in the umbilical area this was relatively severe overnight however this morning he was able have a bowel movement and since then his pain has been improving.  He denies fevers or chills.  Nausea and vomiting initially but none today.   Past Medical History:  Diagnosis Date  . Anxiety   . Asthma   . Crohn's disease ([place])   . Depression   . Rectal fistula     Patient Active Problem List   Diagnosis Date Noted  . Iron deficiency anemia 12/28/2016  . Osteoporosis 12/09/2016  . Generalized abdominal pain   . Perirectal fistula   . Sepsis ([place]) 06/22/2016  . Fracture of metacarpal bone 06/09/2016  . Calculus of gallbladder without cholecystitis without obstruction 12/25/2015  . Gallbladder polyp 12/25/2015  . Blood in stool   . Intestinal bypass or anastomosis status   . Crohn's disease of both small and large intestine with rectal bleeding ([place])   . Crohn's disease ([place]) 08/22/2015  . Perirectal abscess 07/04/2015  . 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 Asthma, mild intermittent 04/02/2015  . Crohn's disease of colon ([place]) 02/06/2015    Past Surgical History:  Procedure Laterality Date  . COLON RESECTION    . COLON SURGERY    . COLONOSCOPY WITH PROPOFOL N/A 04/03/2015   Procedure: COLONOSCOPY WITH PROPOFOL;  Surgeon: Hulen Luster, MD;  Location:  [facility] ENDOSCOPY;  Service: Endoscopy;  Laterality: N/A;  . COLONOSCOPY WITH PROPOFOL N/A 12/22/2015   Procedure: COLONOSCOPY WITH PROPOFOL;  Surgeon: [name], MD;  Location: [place];  Service: Endoscopy;  Laterality: N/A;  . COLONOSCOPY WITH PROPOFOL N/A 12/22/2016   Procedure: COLONOSCOPY WITH PROPOFOL;  Surgeon: Lin Landsman, MD;  Location: [place];  Service: Endoscopy;  Laterality: N/A;  . ESOPHAGOGASTRODUODENOSCOPY (EGD) WITH PROPOFOL N/A 12/22/2016   Procedure: ESOPHAGOGASTRODUODENOSCOPY (EGD) WITH PROPOFOL;  Surgeon: Lin Landsman, MD;  Location: [place];  Service: Endoscopy;  Laterality: N/A;  . rectal fistula surgery    . WRIST SURGERY      Prior to Admission medications   Medication Sig Start Date End Date Taking? Authorizing Provider  busPIRone (BUSPAR) 5 MG tablet Take 1 tablet (5 mg total) by mouth 2 (two) times daily. 05/27/16  Yes Hinda Kehr, MD  escitalopram (LEXAPRO) 10 MG tablet Take 10 mg by mouth daily.  04/02/15  Yes [provider]  folic acid (FOLVITE) 1 MG tablet Take 1 tablet (1 mg total) by mouth daily. Take 2pills on day when you take methotrexate 01/10/17 04/10/17 Yes Vanga, Tally Due, MD  inFLIXimab (REMICADE) 100 MG injection Start 73m/kg IV on weeks 0,2,6 then continue every 8 weeks thereafter 01/10/17  Yes Vanga, RTally Due MD  methotrexate (RHEUMATREX) 5 MG tablet Take 5 tablets (25 mg total) by mouth once a week for 12 doses. Caution:Chemotherapy. Protect from light. 02/28/17 05/17/17 Yes  Lin Landsman, MD  ondansetron (ZOFRAN-ODT) 8 MG disintegrating tablet ondansetron 8 mg disintegrating tablet   Yes [provider]  hydrocortisone (CORTENEMA) 100 MG/60ML enema Place 1 enema (100 mg total) at bedtime for 28 days rectally. Patient not taking: Reported on 03/05/2017 12/13/16 01/10/17  Lin Landsman, MD  omeprazole (PRILOSEC) 20 MG capsule Take 1 capsule (20 mg total) by mouth daily  before breakfast. Patient not taking: Reported on 03/05/2017 11/11/16 12/28/16  Lin Landsman, MD     Allergies Patient has no known allergies.  Family History  Problem Relation Age of Onset  . Diabetes Paternal Grandfather   . Heart disease Paternal Grandfather   . Osteoporosis Neg Hx     Social History Social History   Tobacco Use  . Smoking status: Never Smoker  . Smokeless tobacco: Former Network engineer Use Topics  . Alcohol use: No    Frequency: Never  . Drug use: No    Review of systems Constitutional: No fever/chills Eyes: No visual changes.  ENT: No sore throat. Cardiovascular: Denies chest pain. Respiratory: Denies shortness of breath. Gastrointestinal: Abdominal pain as above, nausea and vomiting resolved Genitourinary: Negative for dysuria. Musculoskeletal: Negative for back pain. Skin: Negative for rash. Neurological: Negative for headaches    ____________________________________________   PHYSICAL EXAM:  VITAL SIGNS: ED Triage Vitals  Enc Vitals Group     BP 03/05/17 1426 113/68     Pulse Rate 03/05/17 1425 (!) 112     Resp 03/05/17 1425 20     Temp 03/05/17 1426 98.5 F (36.9 C)     Temp Source 03/05/17 1425 Oral     SpO2 03/05/17 1425 99 %     Weight 03/05/17 1425 61.2 kg (135 lb)     Height 03/05/17 1425 1.651 m (5' 5" )     Head Circumference --      Peak Flow --      Pain Score 03/05/17 1430 4     Pain Loc --      Pain Edu? --      Excl. in [place]? --     Constitutional: Alert and oriented. No acute distress. Pleasant and interactive Eyes: Conjunctivae are normal.   Nose: No congestion/rhinnorhea. Mouth/Throat: Mucous membranes are moist.    Cardiovascular: Normal rate, regular rhythm. Grossly normal heart sounds.  Good peripheral circulation. Respiratory: Normal respiratory effort.  No retractions. Lungs CTAB. Gastrointestinal: Soft and nontender. No distention.  No CVA tenderness.  During exam Genitourinary:  deferred Musculoskeletal: No lower extremity tenderness nor edema.  Warm and well perfused Neurologic:  Normal speech and language. No gross focal neurologic deficits are appreciated.  Skin:  Skin is warm, dry and intact. No rash noted. Psychiatric: Mood and affect are normal. Speech and behavior are normal.  ____________________________________________   LABS (all labs ordered are listed, but only abnormal results are displayed)  Labs Reviewed  COMPREHENSIVE METABOLIC PANEL - Abnormal; Notable for the following components:      Result Value   Glucose, Bld 112 (*)    Total Bilirubin 1.4 (*)    All other components within normal limits  CBC - Abnormal; Notable for the following components:   WBC 11.9 (*)    RDW 16.6 (*)    All other components within normal limits  LIPASE, BLOOD  URINALYSIS, COMPLETE (UACMP) WITH MICROSCOPIC   ____________________________________________  EKG  None ____________________________________________  RADIOLOGY  None ____________________________________________   PROCEDURES  Procedure(s) performed: No  Procedures   Critical  Care performed: No ____________________________________________   INITIAL IMPRESSION / ASSESSMENT AND PLAN / ED COURSE  Pertinent labs & imaging results that were available during my care of the patient were reviewed by me and considered in my medical decision making (see chart for details).  Patient well-appearing in no acute distress.  Vital signs normal.  Exam is quite reassuring.  Symptoms have been improving over the last 12 hours.  No nausea or vomiting today.  We will treat with IV fluids, small dose of analgesics and nausea medication and reevaluate.  Lab work overall unremarkable  ----------------------------------------- 7:38 PM on 03/05/2017 -----------------------------------------  Patient reports feeling markedly better after treatment.  He is tolerating p.o.'s.  He feels comfortable going home and I  think this is appropriate.  He will call his PCP and GI for follow-up.  Return precautions discussed    ____________________________________________   FINAL CLINICAL IMPRESSION(S) / ED DIAGNOSES  Final diagnoses:  Generalized abdominal pain        Note:  This document was prepared using Dragon voice recognition software and may include unintentional dictation errors.    Lavonia Drafts, MD 03/05/17 [phone]

## 2017-03-05 NOTE — Initial Assessments (Signed)
Pt assessed. Lung sounds clear bilaterally, A+O X4, S1&2 heard. CO of abd pain of 5. Pain started yesterday after he ate almonds. Has hx of chromes. He believes that the almonds triggered a flare up. He felt constipated and nauseas which decreased after he had diarrhea.. Denies vomiting. NAD.  Meredeth Ide, Radio producer.

## 2017-03-11 ENCOUNTER — Other Ambulatory Visit: Payer: Self-pay

## 2017-03-11 ENCOUNTER — Telehealth: Payer: Self-pay

## 2017-03-11 MED ORDER — BUSPIRONE HCL 5 MG PO TABS: 5.0000 mg | ORAL_TABLET | Freq: Two times a day (BID) | ORAL | 0 refills | Status: DC

## 2017-03-11 MED ORDER — METHOTREXATE 2.5 MG PO TABS: 2.5000 mg | ORAL_TABLET | ORAL | 0 refills | Status: DC

## 2017-03-11 MED ORDER — METHOTREXATE 2.5 MG PO TABS: ORAL_TABLET | ORAL | 0 refills | Status: DC

## 2017-03-15 NOTE — Telephone Encounter (Signed)
Spoke with patient regarding infusions.  Will call [place] [facility] to find out specifics on dates and medication dosing

## 2017-03-17 ENCOUNTER — Telehealth: Payer: Self-pay

## 2017-03-17 NOTE — Telephone Encounter (Signed)
Called infusion center to verify when patient has received the infusions and to also speak with nurse on how we can streamline the medication process for the patient as he is having a hard time getting his infusion dose to bring with him to his appointments.  Left message to have a call back in regards to meds.

## 2017-03-22 ENCOUNTER — Ambulatory Visit: Payer: BC Managed Care – PPO | Admitting: Psychiatry

## 2017-03-22 ENCOUNTER — Other Ambulatory Visit
Admission: RE | Admit: 2017-03-22 | Discharge: 2017-03-22 | Disposition: A | Discharge status: Home / Self Care | Payer: BC Managed Care – PPO | Source: Ambulatory Visit | Attending: Psychiatry | Admitting: Psychiatry

## 2017-03-22 ENCOUNTER — Encounter: Payer: Self-pay | Admitting: Psychiatry

## 2017-03-22 ENCOUNTER — Other Ambulatory Visit: Payer: Self-pay

## 2017-03-22 VITALS — BP 114/75 | HR 82 | Temp 98.6°F | Ht 65.0 in | Wt 135.4 lb

## 2017-03-22 DIAGNOSIS — F331 Major depressive disorder, recurrent, moderate: Secondary | ICD-10-CM

## 2017-03-22 DIAGNOSIS — F411 Generalized anxiety disorder: Secondary | ICD-10-CM

## 2017-03-22 LAB — LIPID PANEL
CHOLESTEROL: 130 mg/dL (ref 0–200)
HDL: 47 mg/dL (ref >40)
LDL Cholesterol: 69 mg/dL (ref 0–99)
TRIGLYCERIDES: 72 mg/dL (ref <150)
Total CHOL/HDL Ratio: 2.8 RATIO
VLDL: 14 mg/dL (ref 0–40)

## 2017-03-22 LAB — VITAMIN B12: VITAMIN B 12: 382 pg/mL (ref 180–914)

## 2017-03-22 LAB — CBC WITH DIFFERENTIAL/PLATELET
BASOS ABS: 0.1 10*3/uL (ref 0–0.1)
Basophils Relative: 1 %
Eosinophils Absolute: 0.1 10*3/uL (ref 0–0.7)
Eosinophils Relative: 1 %
HCT: 47.6 % (ref 40.0–52.0)
Hemoglobin: 15.7 g/dL (ref 13.0–18.0)
LYMPHS PCT: 16 %
Lymphs Abs: 1.3 10*3/uL (ref 1.0–3.6)
MCH: 27.6 pg (ref 26.0–34.0)
MCHC: 32.9 g/dL (ref 32.0–36.0)
MCV: 83.8 fL (ref 80.0–100.0)
MONO ABS: 0.7 10*3/uL (ref 0.2–1.0)
Monocytes Relative: 9 %
NEUTROS ABS: 6.5 10*3/uL (ref 1.4–6.5)
NEUTROS PCT: 73 %
Platelets: 231 10*3/uL (ref 150–440)
RBC: 5.68 MIL/uL (ref 4.40–5.90)
RDW: 15.4 % — AB (ref 11.5–14.5)
WBC: 8.7 10*3/uL (ref 3.8–10.6)

## 2017-03-22 LAB — TSH: TSH: 1.257 u[IU]/mL (ref 0.350–4.500)

## 2017-03-22 LAB — COMPREHENSIVE METABOLIC PANEL
ALT: 15 U/L — AB (ref 17–63)
AST: 17 U/L (ref 15–41)
Albumin: 4 g/dL (ref 3.5–5.0)
Alkaline Phosphatase: 89 U/L (ref 38–126)
Anion gap: 7 (ref 5–15)
BUN: 11 mg/dL (ref 6–20)
CHLORIDE: 108 mmol/L (ref 101–111)
CO2: 26 mmol/L (ref 22–32)
CREATININE: 0.78 mg/dL (ref 0.61–1.24)
Calcium: 9.1 mg/dL (ref 8.9–10.3)
Glucose, Bld: 104 mg/dL — ABNORMAL HIGH (ref 65–99)
POTASSIUM: 3.5 mmol/L (ref 3.5–5.1)
Sodium: 141 mmol/L (ref 135–145)
TOTAL PROTEIN: 7.5 g/dL (ref 6.5–8.1)
Total Bilirubin: 1 mg/dL (ref 0.3–1.2)

## 2017-03-22 LAB — HEMOGLOBIN A1C
Hgb A1c MFr Bld: 5 % (ref 4.8–5.6)
Mean Plasma Glucose: 96.8 mg/dL

## 2017-03-22 LAB — FOLATE: FOLATE: 12.3 ng/mL (ref >5.9)

## 2017-03-22 MED ORDER — HYDROXYZINE PAMOATE 25 MG PO CAPS: 25.0000 mg | ORAL_CAPSULE | Freq: Every day | ORAL | 1 refills | Status: DC | PRN

## 2017-03-22 MED ORDER — ARIPIPRAZOLE 5 MG PO TABS: 5.0000 mg | ORAL_TABLET | Freq: Every day | ORAL | 1 refills | Status: DC

## 2017-03-22 NOTE — Patient Instructions (Signed)
Hydroxyzine capsules or tablets What is this medicine? HYDROXYZINE (hye [place] i zeen) is an antihistamine. This medicine is used to treat allergy symptoms. It is also used to treat anxiety and tension. This medicine can be used with other medicines to induce sleep before surgery. This medicine may be used for other purposes; ask your health care provider or pharmacist if you have questions. COMMON BRAND NAME(S): ANX, Atarax, Rezine, Vistaril What should I tell my health care provider before I take this medicine? They need to know if you have any of these conditions: -any chronic illness -difficulty passing urine -glaucoma -heart disease -kidney disease -liver disease -lung disease -an unusual or allergic reaction to hydroxyzine, cetirizine, other medicines, foods, dyes, or preservatives -pregnant or trying to get pregnant -breast-feeding How should I use this medicine? Take this medicine by mouth with a full glass of water. Follow the directions on the prescription label. You may take this medicine with food or on an empty stomach. Take your medicine at regular intervals. Do not take your medicine more often than directed. Talk to your pediatrician regarding the use of this medicine in children. Special care may be needed. While this drug may be prescribed for children as young as 75 years of age for selected conditions, precautions do apply. Patients over 62 years old may have a stronger reaction and need a smaller dose. Overdosage: If you think you have taken too much of this medicine contact a poison control center or emergency room at once. NOTE: This medicine is only for you. Do not share this medicine with others. What if I miss a dose? If you miss a dose, take it as soon as you can. If it is almost time for your next dose, take only that dose. Do not take double or extra doses. What may interact with this medicine? -alcohol -barbiturate medicines for sleep or seizures -medicines for  colds, allergies -medicines for depression, anxiety, or emotional disturbances -medicines for pain -medicines for sleep -muscle relaxants This list may not describe all possible interactions. Give your health care provider a list of all the medicines, herbs, non-prescription drugs, or dietary supplements you use. Also tell them if you smoke, drink alcohol, or use illegal drugs. Some items may interact with your medicine. What should I watch for while using this medicine? Tell your doctor or health care professional if your symptoms do not improve. You may get drowsy or dizzy. Do not drive, use machinery, or do anything that needs mental alertness until you know how this medicine affects you. Do not stand or sit up quickly, especially if you are an older patient. This reduces the risk of dizzy or fainting spells. Alcohol may interfere with the effect of this medicine. Avoid alcoholic drinks. Your mouth may get dry. Chewing sugarless gum or sucking hard candy, and drinking plenty of water may help. Contact your doctor if the problem does not go away or is severe. This medicine may cause dry eyes and blurred vision. If you wear contact lenses you may feel some discomfort. Lubricating drops may help. See your eye doctor if the problem does not go away or is severe. If you are receiving skin tests for allergies, tell your doctor you are using this medicine. What side effects may I notice from receiving this medicine? Side effects that you should report to your doctor or health care professional as soon as possible: -fast or irregular heartbeat -difficulty passing urine -seizures -slurred speech or confusion -tremor Side effects that  usually do not require medical attention (report to your doctor or health care professional if they continue or are bothersome): -constipation -drowsiness -fatigue -headache -stomach upset This list may not describe all possible side effects. Call your doctor for  medical advice about side effects. You may report side effects to FDA at 1-800-FDA-1088. Where should I keep my medicine? Keep out of the reach of children. Store at room temperature between 15 and 30 degrees C (59 and 86 degrees F). Keep container tightly closed. Throw away any unused medicine after the expiration date. NOTE: This sheet is a summary. It may not cover all possible information. If you have questions about this medicine, talk to your doctor, pharmacist, or health care provider.  2018 Elsevier/Gold Standard (2007-06-09 14:50:59) Aripiprazole tablets What is this medicine? ARIPIPRAZOLE (ay ri PIP ray zole) is an atypical antipsychotic. It is used to treat schizophrenia and bipolar disorder, also known as manic-depression. It is also used to treat Tourette's disorder and some symptoms of autism. This medicine may also be used in combination with antidepressants to treat major depressive disorder. This medicine may be used for other purposes; ask your health care provider or pharmacist if you have questions. COMMON BRAND NAME(S): Abilify What should I tell my health care provider before I take this medicine? They need to know if you have any of these conditions: -dehydration -dementia -diabetes -heart disease -history of stroke -low blood counts, like low white cell, platelet, or red cell counts -Parkinson's disease -seizures -suicidal thoughts, plans, or attempt; a previous suicide attempt by you or a family member -an unusual or allergic reaction to aripiprazole, other medicines, foods, dyes, or preservatives -pregnant or trying to get pregnant -breast-feeding How should I use this medicine? Take this medicine by mouth with a glass of water. Follow the directions on the prescription label. You can take this medicine with or without food. Take your doses at regular intervals. Do not take your medicine more often than directed. Do not stop taking except on the advice of your  doctor or health care professional. A special MedGuide will be given to you by the pharmacist with each prescription and refill. Be sure to read this information carefully each time. Talk to your pediatrician regarding the use of this medicine in children. While this drug may be prescribed for children as young as 12 years of age for selected conditions, precautions do apply. Overdosage: If you think you have taken too much of this medicine contact a poison control center or emergency room at once. NOTE: This medicine is only for you. Do not share this medicine with others. What if I miss a dose? If you miss a dose, take it as soon as you can. If it is almost time for your next dose, take only that dose. Do not take double or extra doses. What may interact with this medicine? Do not take this medicine with any of the following medications: -brexpiprazole -cisapride -dofetilide -dronedarone -metoclopramide -pimozide -thioridazine This medicine may also interact with the following medications: -alcohol -carbamazepine -certain medicines for anxiety or sleep -certain medicines for blood pressure -certain medicines for fungal infections like ketoconazole, fluconazole, posaconazole, and itraconazole -clarithromycin -fluoxetine -other medicines that prolong the QT interval (cause an abnormal heart rhythm) -paroxetine -quinidine -rifampin This list may not describe all possible interactions. Give your health care provider a list of all the medicines, herbs, non-prescription drugs, or dietary supplements you use. Also tell them if you smoke, drink alcohol, or use illegal drugs.  Some items may interact with your medicine. What should I watch for while using this medicine? Visit your doctor or health care professional for regular checks on your progress. It may be several weeks before you see the full effects of this medicine. Do not suddenly stop taking this medicine. You may need to gradually  reduce the dose. Patients and their families should watch out for worsening depression or thoughts of suicide. Also watch out for sudden changes in feelings such as feeling anxious, agitated, panicky, irritable, hostile, aggressive, impulsive, severely restless, overly excited and hyperactive, or not being able to sleep. If this happens, especially at the beginning of antidepressant treatment or after a change in dose, call your health care professional. Dennis Bast may get dizzy or drowsy. Do not drive, use machinery, or do anything that needs mental alertness until you know how this medicine affects you. Do not stand or sit up quickly, especially if you are an older patient. This reduces the risk of dizzy or fainting spells. Alcohol can increase dizziness and drowsiness. Avoid alcoholic drinks. This medicine can reduce the response of your body to heat or cold. Dress warm in cold weather and stay hydrated in hot weather. If possible, avoid extreme temperatures like saunas, hot tubs, very hot or cold showers, or activities that can cause dehydration such as vigorous exercise. This medicine may cause dry eyes and blurred vision. If you wear contact lenses you may feel some discomfort. Lubricating drops may help. See your eye doctor if the problem does not go away or is severe. If you notice an increased hunger or thirst, different from your normal hunger or thirst, or if you find that you have to urinate more frequently, you should contact your health care provider as soon as possible. You may need to have your blood sugar monitored. This medicine may cause changes in your blood sugar levels. You should monitor you blood sugar frequently if you have diabetes. There have been reports of uncontrollable and strong urges to gamble, binge eat, shop, and have sex while taking this medicine. If you experience any of these or other uncontrollable and strong urges while taking this medicine, you should report it to your health  care provider as soon as possible. What side effects may I notice from receiving this medicine? Side effects that you should report to your doctor or health care professional as soon as possible: -allergic reactions like skin rash, itching or hives, swelling of the face, lips, or tongue -breathing problems -confusion -feeling faint or lightheaded, falls -fever or chills, sore throat -increased hunger or thirst -increased urination -joint pain -muscles pain, spasms -problems with balance, talking, walking -restlessness or need to keep moving -seizures -suicidal thoughts or other mood changes -trouble swallowing -uncontrollable and excessive urges (examples: gambling, binge eating, shopping, having sex) -uncontrollable head, mouth, neck, arm, or leg movements -unusually weak or tired Side effects that usually do not require medical attention (report to your doctor or health care professional if they continue or are bothersome): -blurred vision -constipation -headache -nausea, vomiting -trouble sleeping -weight gain This list may not describe all possible side effects. Call your doctor for medical advice about side effects. You may report side effects to FDA at 1-800-FDA-1088. Where should I keep my medicine? Keep out of the reach of children. Store at room temperature between 15 and 30 degrees C (59 and 86 degrees F). Throw away any unused medicine after the expiration date. NOTE: This sheet is a summary. It may not  cover all possible information. If you have questions about this medicine, talk to your doctor, pharmacist, or health care provider.  2018 Elsevier/Gold Standard (2016-01-11 11:45:05)
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
## 2017-03-23 LAB — PROLACTIN: Prolactin: 9.4 ng/mL (ref 4.0–15.2)

## 2017-03-23 LAB — VITAMIN D 25 HYDROXY (VIT D DEFICIENCY, FRACTURES): VIT D 25 HYDROXY: 19.3 ng/mL — AB (ref 30.0–100.0)

## 2017-03-28 NOTE — Progress Notes (Signed)
Message was left for patient about labwork results. Copy will be mailed to patient and a copy faxed to pcp

## 2017-04-04 ENCOUNTER — Ambulatory Visit: Payer: BC Managed Care – PPO | Admitting: Endocrinology

## 2017-04-04 ENCOUNTER — Encounter: Payer: Self-pay | Admitting: Endocrinology

## 2017-04-04 VITALS — BP 130/84 | HR 76 | Ht 65.0 in | Wt 136.2 lb

## 2017-04-04 DIAGNOSIS — M810 Age-related osteoporosis without current pathological fracture: Secondary | ICD-10-CM | POA: Diagnosis not present

## 2017-04-04 MED ORDER — ERGOCALCIFEROL 1.25 MG (50000 UT) PO CAPS: 50000.0000 [IU] | ORAL_CAPSULE | ORAL | 1 refills | Status: DC

## 2017-04-04 NOTE — Progress Notes (Signed)
Subjective:    Patient ID: Leroy Hernandez, male    DOB: November 05, 1985, 32 y.o.   MRN: [id number]  HPI Pt returns for f/u of osteoporosis: Dx'ed: 2018 Secondary causes: vit-D deficiency and intermitt steroids Fractures: right wrist (2000), right little finger (1995), and left hand (2018 Past rx: none Current rx: ergocalciferol.  Last DEXA result: (2018): Femur Neck Right Z-score of -3.3.  Other: plan is to first normalize vit-D. Interval hx: He took ergocalciferol as rx'ed.  diarrhea is much better, with immuran.   Past Medical History:  Diagnosis Date  . Anxiety   . Asthma   . Crohn's disease ([place])   . Depression   . Rectal fistula     Past Surgical History:  Procedure Laterality Date  . COLON RESECTION    . COLON SURGERY    . COLONOSCOPY WITH PROPOFOL N/A 04/03/2015   Procedure: COLONOSCOPY WITH PROPOFOL;  Surgeon: Hulen Luster, MD;  Location[facility] ENDOSCOPY;  Service: Endoscopy;  Laterality: N/A;  . COLONOSCOPY WITH PROPOFOL N/A 12/22/2015   Procedure: COLONOSCOPY WITH PROPOFOL;  Surgeon: [name], MD;  Location: [place];  Service: Endoscopy;  Laterality: N/A;  . COLONOSCOPY WITH PROPOFOL N/A 12/22/2016   Procedure: COLONOSCOPY WITH PROPOFOL;  Surgeon: Lin Landsman, MD;  Location: [place];  Service: Endoscopy;  Laterality: N/A;  . ESOPHAGOGASTRODUODENOSCOPY (EGD) WITH PROPOFOL N/A 12/22/2016   Procedure: ESOPHAGOGASTRODUODENOSCOPY (EGD) WITH PROPOFOL;  Surgeon: Lin Landsman, MD;  Location: [place];  Service: Endoscopy;  Laterality: N/A;  . rectal fistula surgery    . WRIST SURGERY      Social History   Socioeconomic History  . Marital status: Legally Separated    Spouse name: Not on file  . Number of children: 0  . Years of education: Not on file  . Highest education level: Associate degree: occupational, Hotel manager, or vocational program  Social Needs  . Financial resource strain: Not hard at all  . Food insecurity -  worry: Never true  . Food insecurity - inability: Never true  . Transportation needs - medical: No  . Transportation needs - non-medical: No  Occupational History  . Not on file  Tobacco Use  . Smoking status: Never Smoker  . Smokeless tobacco: Former Network engineer and Sexual Activity  . Alcohol use: No    Frequency: Never  . Drug use: No    Comment: hx of 3 year ago   . Sexual activity: Yes  Other Topics Concern  . Not on file  Social History Narrative  . Not on file    Current Outpatient Medications on File Prior to Visit  Medication Sig Dispense Refill  . albuterol (PROAIR HFA) 108 (90 Base) MCG/ACT inhaler inhale 2 puff by inhalation route  every 4 - 6 hours as needed    . ARIPiprazole (ABILIFY) 5 MG tablet Take 1 tablet (5 mg total) by mouth at bedtime. 30 tablet 1  . budesonide (ENTOCORT EC) 3 MG 24 hr capsule Take by mouth daily.    . busPIRone (BUSPAR) 5 MG tablet Take by mouth.    . escitalopram (LEXAPRO) 10 MG tablet Take 10 mg by mouth daily.     . folic acid (FOLVITE) 1 MG tablet Take 1 tablet (1 mg total) by mouth daily. Take 2pills on day when you take methotrexate 90 tablet 1  . hydrOXYzine (VISTARIL) 25 MG capsule Take 1 capsule (25 mg total) by mouth daily as needed (severe anxiety attacks). [place]  capsule 1  . inFLIXimab (REMICADE) 100 MG injection Start 8m/kg IV on weeks 0,2,6 then continue every 8 weeks thereafter 1 each 3  . methotrexate (RHEUMATREX) 2.5 MG tablet Take 10 tablets by mouth once weekly for 12 weeks Caution:Chemotherapy. Protect from light. 120 tablet 0  . ondansetron (ZOFRAN-ODT) 8 MG disintegrating tablet ondansetron 8 mg disintegrating tablet    . hydrocortisone (CORTENEMA) 100 MG/60ML enema Place 1 enema (100 mg total) at bedtime for 28 days rectally. (Patient not taking: Reported on 03/05/2017) 28 enema 0  . omeprazole (PRILOSEC) 20 MG capsule Take 1 capsule (20 mg total) by mouth daily before breakfast. (Patient not taking: Reported on  03/05/2017) 60 capsule 2   No current facility-administered medications on file prior to visit.     No Known Allergies  Family History  Problem Relation Age of Onset  . Diabetes Paternal Grandfather   . Heart disease Paternal Grandfather   . Depression Mother   . Drug abuse Mother   . Osteoporosis Neg Hx     BP 130/84   Pulse 76   Ht 5' 5"  (1.651 m)   Wt 136 lb 3.2 oz (61.8 kg)   BMI 22.66 kg/m    Review of Systems Denies cramps    Objective:   Physical Exam VITAL SIGNS:  See vs page GENERAL: no distress Spine: no kyphosis.    25-OH vit-D=19    Assessment & Plan:  Vit-D def, persistent. Osteoporosis. We'll consider rx when vit-D is better   Patient Instructions  I have sent a prescription to your pharmacy, to resume the vitamin-D, 3 times per week Please come back for a follow-up appointment in 4-6 weeks. The next step is to see how much vitamin-D you need on an ongoing basis.  Then we'll reassess the osteoporosis.

## 2017-04-04 NOTE — Patient Instructions (Signed)
I have sent a prescription to your pharmacy, to resume the vitamin-D, 3 times per week Please come back for a follow-up appointment in 4-6 weeks. The next step is to see how much vitamin-D you need on an ongoing basis.  Then we'll reassess the osteoporosis.

## 2017-04-05 ENCOUNTER — Telehealth: Payer: Self-pay

## 2017-04-06 NOTE — Telephone Encounter (Signed)
Spoke with Renalta at [facility].  She stated that patient will be receiving his Remicade through them directly.  They will buy meds and bill him since he is receiving his infusions there all will be taken care of at one place.

## 2017-04-07 ENCOUNTER — Telehealth: Payer: Self-pay

## 2017-04-07 NOTE — Telephone Encounter (Signed)
Patients rx for Remicade has been canceled.  Per Renalta at [facility] where he receives his infusions-pt will be billed and receive Remicade directly by them. Spoke with Apolonio Schneiders at [facility] to cancel.  [facility] Contact # [phone]

## 2017-04-12 ENCOUNTER — Ambulatory Visit: Payer: Self-pay | Admitting: Psychiatry

## 2017-04-18 ENCOUNTER — Encounter: Payer: Self-pay | Admitting: Psychiatry

## 2017-04-18 ENCOUNTER — Other Ambulatory Visit: Payer: Self-pay

## 2017-04-18 ENCOUNTER — Ambulatory Visit (INDEPENDENT_AMBULATORY_CARE_PROVIDER_SITE_OTHER): Payer: BC Managed Care – PPO | Admitting: Psychiatry

## 2017-04-18 ENCOUNTER — Ambulatory Visit: Payer: BC Managed Care – PPO | Admitting: Licensed Clinical Social Worker

## 2017-04-18 DIAGNOSIS — F331 Major depressive disorder, recurrent, moderate: Secondary | ICD-10-CM | POA: Diagnosis not present

## 2017-04-18 DIAGNOSIS — F411 Generalized anxiety disorder: Secondary | ICD-10-CM

## 2017-04-18 MED ORDER — HYDROXYZINE PAMOATE 25 MG PO CAPS: 25.0000 mg | ORAL_CAPSULE | Freq: Every day | ORAL | 2 refills | Status: DC | PRN

## 2017-04-18 MED ORDER — BUSPIRONE HCL 5 MG PO TABS: 5.0000 mg | ORAL_TABLET | Freq: Two times a day (BID) | ORAL | 2 refills | Status: DC

## 2017-04-18 MED ORDER — ARIPIPRAZOLE 5 MG PO TABS: 5.0000 mg | ORAL_TABLET | Freq: Every day | ORAL | 2 refills | Status: DC

## 2017-04-18 MED ORDER — ESCITALOPRAM OXALATE 10 MG PO TABS: 10.0000 mg | ORAL_TABLET | Freq: Every day | ORAL | 2 refills | Status: DC
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
## 2017-04-20 ENCOUNTER — Other Ambulatory Visit: Payer: Self-pay

## 2017-04-20 ENCOUNTER — Telehealth: Payer: Self-pay

## 2017-04-20 DIAGNOSIS — K5010 Crohn's disease of large intestine without complications: Secondary | ICD-10-CM

## 2017-04-20 NOTE — Progress Notes (Signed)
cb

## 2017-04-20 NOTE — Telephone Encounter (Signed)
LVM for pt to contact office to schedule follow up in 1-2 weeks with Dr. [name].  Recheck CBC, LFT prior to this visit.  Thanks [employer]

## 2017-04-30 ENCOUNTER — Emergency Department: Payer: BC Managed Care – PPO

## 2017-04-30 ENCOUNTER — Other Ambulatory Visit: Payer: Self-pay

## 2017-04-30 ENCOUNTER — Encounter: Payer: Self-pay | Admitting: *Deleted

## 2017-04-30 ENCOUNTER — Observation Stay
Admission: EM | Admit: 2017-04-30 | Discharge: 2017-05-01 | Disposition: A | Discharge status: Home / Self Care | Payer: BC Managed Care – PPO | Attending: Internal Medicine | Admitting: Internal Medicine

## 2017-04-30 DIAGNOSIS — M810 Age-related osteoporosis without current pathological fracture: Secondary | ICD-10-CM | POA: Insufficient documentation

## 2017-04-30 DIAGNOSIS — F419 Anxiety disorder, unspecified: Secondary | ICD-10-CM | POA: Insufficient documentation

## 2017-04-30 DIAGNOSIS — Z980 Intestinal bypass and anastomosis status: Secondary | ICD-10-CM | POA: Diagnosis not present

## 2017-04-30 DIAGNOSIS — Z87891 Personal history of nicotine dependence: Secondary | ICD-10-CM | POA: Insufficient documentation

## 2017-04-30 DIAGNOSIS — E860 Dehydration: Secondary | ICD-10-CM | POA: Diagnosis present

## 2017-04-30 DIAGNOSIS — A0472 Enterocolitis due to Clostridium difficile, not specified as recurrent: Principal | ICD-10-CM | POA: Insufficient documentation

## 2017-04-30 DIAGNOSIS — F329 Major depressive disorder, single episode, unspecified: Secondary | ICD-10-CM | POA: Diagnosis not present

## 2017-04-30 DIAGNOSIS — D509 Iron deficiency anemia, unspecified: Secondary | ICD-10-CM | POA: Diagnosis not present

## 2017-04-30 DIAGNOSIS — Z8249 Family history of ischemic heart disease and other diseases of the circulatory system: Secondary | ICD-10-CM | POA: Insufficient documentation

## 2017-04-30 DIAGNOSIS — Z79899 Other long term (current) drug therapy: Secondary | ICD-10-CM | POA: Insufficient documentation

## 2017-04-30 DIAGNOSIS — A0811 Acute gastroenteropathy due to Norwalk agent: Secondary | ICD-10-CM | POA: Diagnosis not present

## 2017-04-30 DIAGNOSIS — J45909 Unspecified asthma, uncomplicated: Secondary | ICD-10-CM | POA: Diagnosis not present

## 2017-04-30 DIAGNOSIS — J4520 Mild intermittent asthma, uncomplicated: Secondary | ICD-10-CM | POA: Insufficient documentation

## 2017-04-30 DIAGNOSIS — K5090 Crohn's disease, unspecified, without complications: Secondary | ICD-10-CM | POA: Insufficient documentation

## 2017-04-30 DIAGNOSIS — Z833 Family history of diabetes mellitus: Secondary | ICD-10-CM | POA: Insufficient documentation

## 2017-04-30 DIAGNOSIS — Z8489 Family history of other specified conditions: Secondary | ICD-10-CM | POA: Diagnosis not present

## 2017-04-30 DIAGNOSIS — K50119 Crohn's disease of large intestine with unspecified complications: Secondary | ICD-10-CM | POA: Diagnosis present

## 2017-04-30 LAB — URINALYSIS, COMPLETE (UACMP) WITH MICROSCOPIC
BILIRUBIN URINE: NEGATIVE
Bacteria, UA: NONE SEEN
GLUCOSE, UA: NEGATIVE mg/dL
Hgb urine dipstick: NEGATIVE
KETONES UR: 20 mg/dL — AB
LEUKOCYTES UA: NEGATIVE
NITRITE: NEGATIVE
PH: 6 (ref 5.0–8.0)
Protein, ur: NEGATIVE mg/dL
SPECIFIC GRAVITY, URINE: 1.04 — AB (ref 1.005–1.030)
Squamous Epithelial / LPF: NONE SEEN

## 2017-04-30 LAB — LIPASE, BLOOD: Lipase: 34 U/L (ref 11–51)

## 2017-04-30 LAB — DIFFERENTIAL
Basophils Absolute: 0 10*3/uL (ref 0–0.1)
Basophils Relative: 0 %
EOS ABS: 0.1 10*3/uL (ref 0–0.7)
EOS PCT: 0 %
LYMPHS PCT: 2 %
Lymphs Abs: 0.4 10*3/uL — ABNORMAL LOW (ref 1.0–3.6)
MONO ABS: 0.4 10*3/uL (ref 0.2–1.0)
Monocytes Relative: 2 %
Neutro Abs: 21.4 10*3/uL — ABNORMAL HIGH (ref 1.4–6.5)
Neutrophils Relative %: 96 %

## 2017-04-30 LAB — BASIC METABOLIC PANEL
Anion gap: 12 (ref 5–15)
BUN: 19 mg/dL (ref 6–20)
CALCIUM: 9.2 mg/dL (ref 8.9–10.3)
CO2: 22 mmol/L (ref 22–32)
CREATININE: 0.84 mg/dL (ref 0.61–1.24)
Chloride: 106 mmol/L (ref 101–111)
GFR calc Af Amer: >60 mL/min (ref >60)
Glucose, Bld: 138 mg/dL — ABNORMAL HIGH (ref 65–99)
Potassium: 3.8 mmol/L (ref 3.5–5.1)
SODIUM: 140 mmol/L (ref 135–145)

## 2017-04-30 LAB — HEPATIC FUNCTION PANEL
ALBUMIN: 4.7 g/dL (ref 3.5–5.0)
ALK PHOS: 86 U/L (ref 38–126)
ALT: 23 U/L (ref 17–63)
AST: 26 U/L (ref 15–41)
BILIRUBIN TOTAL: 1.7 mg/dL — AB (ref 0.3–1.2)
Bilirubin, Direct: 0.3 mg/dL (ref 0.1–0.5)
Indirect Bilirubin: 1.4 mg/dL — ABNORMAL HIGH (ref 0.3–0.9)
Total Protein: 8 g/dL (ref 6.5–8.1)

## 2017-04-30 LAB — CBC
HCT: 51.6 % (ref 40.0–52.0)
Hemoglobin: 17.4 g/dL (ref 13.0–18.0)
MCH: 28.4 pg (ref 26.0–34.0)
MCHC: 33.6 g/dL (ref 32.0–36.0)
MCV: 84.4 fL (ref 80.0–100.0)
PLATELETS: 239 10*3/uL (ref 150–440)
RBC: 6.12 MIL/uL — ABNORMAL HIGH (ref 4.40–5.90)
RDW: 14.1 % (ref 11.5–14.5)
WBC: 21.8 10*3/uL — AB (ref 3.8–10.6)

## 2017-04-30 LAB — TROPONIN I

## 2017-04-30 MED ORDER — ARIPIPRAZOLE 5 MG PO TABS
5.0000 mg | ORAL_TABLET | Freq: Every day | ORAL | Status: DC
  Administered 2017-04-30: 5 mg via ORAL
  Filled 2017-04-30 (×2): qty 1

## 2017-04-30 MED ORDER — LEVOFLOXACIN IN D5W 500 MG/100ML IV SOLN
500.0000 mg | INTRAVENOUS | Status: DC
Start: 2017-04-30 — End: 2017-05-01
  Administered 2017-05-01: 500 mg via INTRAVENOUS
  Filled 2017-04-30 (×3): qty 100

## 2017-04-30 MED ORDER — BUDESONIDE 3 MG PO CPEP
9.0000 mg | ORAL_CAPSULE | Freq: Every day | ORAL | Status: DC
  Administered 2017-04-30 – 2017-05-01 (×2): 9 mg via ORAL
  Filled 2017-04-30 (×2): qty 3

## 2017-04-30 MED ORDER — IOPAMIDOL (ISOVUE-300) INJECTION 61%
30.0000 mL | Freq: Once | INTRAVENOUS | Status: AC | PRN
  Administered 2017-04-30: 30 mL via ORAL

## 2017-04-30 MED ORDER — BUSPIRONE HCL 5 MG PO TABS
5.0000 mg | ORAL_TABLET | Freq: Two times a day (BID) | ORAL | Status: DC
  Administered 2017-04-30 – 2017-05-01 (×2): 5 mg via ORAL
  Filled 2017-04-30 (×3): qty 1

## 2017-04-30 MED ORDER — SODIUM CHLORIDE 0.9 % IV BOLUS (SEPSIS)
1000.0000 mL | Freq: Once | INTRAVENOUS | Status: AC
  Administered 2017-04-30: 1000 mL via INTRAVENOUS

## 2017-04-30 MED ORDER — OXYCODONE HCL 5 MG PO TABS
5.0000 mg | ORAL_TABLET | Freq: Four times a day (QID) | ORAL | Status: DC | PRN
  Administered 2017-04-30 – 2017-05-01 (×2): 5 mg via ORAL
  Filled 2017-04-30 (×2): qty 1

## 2017-04-30 MED ORDER — ONDANSETRON HCL 4 MG/2ML IJ SOLN
4.0000 mg | Freq: Once | INTRAMUSCULAR | Status: AC | PRN
  Administered 2017-04-30: 4 mg via INTRAVENOUS
  Filled 2017-04-30: qty 2

## 2017-04-30 MED ORDER — METHYLPREDNISOLONE SODIUM SUCC 125 MG IJ SOLR
60.0000 mg | INTRAMUSCULAR | Status: DC
  Administered 2017-04-30: 60 mg via INTRAVENOUS
  Filled 2017-04-30: qty 2

## 2017-04-30 MED ORDER — VITAMIN D (ERGOCALCIFEROL) 1.25 MG (50000 UNIT) PO CAPS: 50000.0000 [IU] | ORAL_CAPSULE | ORAL | Status: DC

## 2017-04-30 MED ORDER — ONDANSETRON HCL 4 MG/2ML IJ SOLN: 4.0000 mg | Freq: Four times a day (QID) | INTRAMUSCULAR | Status: DC | PRN

## 2017-04-30 MED ORDER — ONDANSETRON HCL 4 MG PO TABS: 4.0000 mg | ORAL_TABLET | Freq: Four times a day (QID) | ORAL | Status: DC | PRN

## 2017-04-30 MED ORDER — HYDROMORPHONE HCL 1 MG/ML IJ SOLN
1.0000 mg | Freq: Once | INTRAMUSCULAR | Status: AC
  Administered 2017-04-30: 1 mg via INTRAVENOUS
  Filled 2017-04-30: qty 1

## 2017-04-30 MED ORDER — ESCITALOPRAM OXALATE 10 MG PO TABS
10.0000 mg | ORAL_TABLET | Freq: Every day | ORAL | Status: DC
  Administered 2017-04-30 – 2017-05-01 (×2): 10 mg via ORAL
  Filled 2017-04-30 (×2): qty 1

## 2017-04-30 MED ORDER — ONDANSETRON HCL 4 MG/2ML IJ SOLN
4.0000 mg | Freq: Once | INTRAMUSCULAR | Status: AC
  Administered 2017-04-30: 4 mg via INTRAVENOUS
  Filled 2017-04-30: qty 2

## 2017-04-30 MED ORDER — ACETAMINOPHEN 650 MG RE SUPP: 650.0000 mg | Freq: Four times a day (QID) | RECTAL | Status: DC | PRN

## 2017-04-30 MED ORDER — ACETAMINOPHEN 325 MG PO TABS: 650.0000 mg | ORAL_TABLET | Freq: Four times a day (QID) | ORAL | Status: DC | PRN

## 2017-04-30 MED ORDER — FOLIC ACID 1 MG PO TABS
1.0000 mg | ORAL_TABLET | Freq: Every day | ORAL | Status: DC
  Administered 2017-04-30 – 2017-05-01 (×2): 1 mg via ORAL
  Filled 2017-04-30 (×2): qty 1

## 2017-04-30 MED ORDER — IOPAMIDOL (ISOVUE-300) INJECTION 61%
100.0000 mL | Freq: Once | INTRAVENOUS | Status: AC | PRN
  Administered 2017-04-30: 100 mL via INTRAVENOUS

## 2017-04-30 MED ORDER — METRONIDAZOLE IN NACL 5-0.79 MG/ML-% IV SOLN
500.0000 mg | Freq: Three times a day (TID) | INTRAVENOUS | Status: DC
  Administered 2017-04-30 – 2017-05-01 (×3): 500 mg via INTRAVENOUS
  Filled 2017-04-30 (×5): qty 100

## 2017-04-30 MED ORDER — SODIUM CHLORIDE 0.9 % IV SOLN
INTRAVENOUS | Status: DC
  Administered 2017-04-30 – 2017-05-01 (×2): via INTRAVENOUS

## 2017-04-30 NOTE — ED Triage Notes (Signed)
Pt to ED reporting new onset of severe abd pain today. Hx of chron's disease and pt report the pain is similar to past flair ups. Constant nausea and vomiting with diarrhea. No fevers. Pt reports feeling as though he is going to pass out. Pt is pale in color in lobby.

## 2017-04-30 NOTE — H&P (Signed)
Leroy Hernandez at [place] NAME: Leroy Hernandez    MR#:  [id number]  DATE OF BIRTH:  January 12, 1986  DATE OF ADMISSION:  04/30/2017  PRIMARY CARE PHYSICIAN: Leroy Ruths, MD   REQUESTING/REFERRING PHYSICIAN:   CHIEF COMPLAINT:   Chief Complaint  Patient presents with  . Abdominal Pain  . Near Syncope    HISTORY OF PRESENT ILLNESS: Leroy Hernandez  is a 32 y.o. 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  Abdominal discomfort is diffusely located around the umbilicus aching in nature 6 out of 10 on a scale of 1-10.  Patient also felt dizzy and lightheaded today.  He was evaluated in the emergency room with a CT abdomen which showed Crohn's disease exacerbation.  Patient was started on IV fluids and IV antibiotics.  Case was discussed with gastroenterology on call by ER physician.  Hospitalist service was consulted for further care.  No vomiting of blood.  Patient had some blood-streaked stools when he had the diarrhea.  No fever and chills.  PAST MEDICAL HISTORY:   Past Medical History:  Diagnosis Date  . Anxiety   . Asthma   . Crohn's disease ([place])   . Depression   . Rectal fistula     PAST SURGICAL HISTORY:  Past Surgical History:  Procedure Laterality Date  . COLON RESECTION    . COLON SURGERY    . COLONOSCOPY WITH PROPOFOL N/A 04/03/2015   Procedure: COLONOSCOPY WITH PROPOFOL;  Surgeon: Hulen Luster, MD;  Location: [facility] ENDOSCOPY;  Service: Endoscopy;  Laterality: N/A;  . COLONOSCOPY WITH PROPOFOL N/A 12/22/2015   Procedure: COLONOSCOPY WITH PROPOFOL;  Surgeon: [name], MD;  Location: [place];  Service: Endoscopy;  Laterality: N/A;  . COLONOSCOPY WITH PROPOFOL N/A 12/22/2016   Procedure: COLONOSCOPY WITH PROPOFOL;  Surgeon: Lin Landsman, MD;  Location: [place];  Service: Endoscopy;  Laterality: N/A;  . ESOPHAGOGASTRODUODENOSCOPY (EGD) WITH PROPOFOL N/A 12/22/2016   Procedure: ESOPHAGOGASTRODUODENOSCOPY (EGD) WITH PROPOFOL;  Surgeon: Lin Landsman, MD;  Location: [place];  Service: Endoscopy;  Laterality: N/A;  . rectal fistula surgery    . WRIST SURGERY      SOCIAL HISTORY:  Social History   Tobacco Use  . Smoking status: Never Smoker  . Smokeless tobacco: Former Network engineer Use Topics  . Alcohol use: No    Frequency: Never    FAMILY HISTORY:  Family History  Problem Relation Age of Onset  . Diabetes Paternal Grandfather   . Heart disease Paternal Grandfather   . Depression Mother   . Drug abuse Mother   . Osteoporosis Neg Hx     DRUG ALLERGIES: No Known Allergies  REVIEW OF SYSTEMS:   CONSTITUTIONAL: No fever, fatigue or weakness.  EYES: No blurred or double vision.  EARS, NOSE, AND THROAT: No tinnitus or ear pain.  RESPIRATORY: No cough, shortness of breath, wheezing or hemoptysis.  CARDIOVASCULAR: No chest pain, orthopnea, edema.  GASTROINTESTINAL: Has nausea, vomiting, diarrhea and abdominal pain.  GENITOURINARY: No dysuria, hematuria.  ENDOCRINE: No polyuria, nocturia,  HEMATOLOGY: No anemia, easy bruising or bleeding SKIN: No rash or lesion. MUSCULOSKELETAL: No joint pain or arthritis.   NEUROLOGIC: No tingling, numbness, weakness.  PSYCHIATRY: No anxiety or depression.   MEDICATIONS AT HOME:  Prior to Admission medications   Medication  Sig Start Date End Date Taking? Authorizing Provider  ARIPiprazole (ABILIFY) 5 MG tablet Take 1 tablet (5 mg total) by mouth at bedtime. 04/18/17  Yes Eappen, Ria Clock, MD  budesonide (ENTOCORT EC) 3 MG 24 hr capsule Take 9 mg by mouth daily.    Yes [provider]  busPIRone (BUSPAR) 5 MG tablet Take 1 tablet (5 mg total) by mouth 2 (two) times daily. 04/18/17  Yes Ursula Alert, MD  ergocalciferol (VITAMIN D2) 50000 units capsule Take 1  capsule (50,000 Units total) by mouth 3 (three) times a week. 04/04/17 05/03/17 Yes Renato Shin, MD  escitalopram (LEXAPRO) 10 MG tablet Take 1 tablet (10 mg total) by mouth daily. 04/18/17  Yes Ursula Alert, MD  folic acid (FOLVITE) 1 MG tablet Take 1 mg by mouth daily.   Yes [provider]  hydrOXYzine (VISTARIL) 25 MG capsule Take 1 capsule (25 mg total) by mouth daily as needed (severe anxiety attacks). 04/18/17  Yes Ursula Alert, MD  inFLIXimab (REMICADE) 100 MG injection Start 80m/kg IV on weeks 0,2,6 then continue every 8 weeks thereafter 01/10/17  Yes Vanga, RTally Due MD  methotrexate (RHEUMATREX) 2.5 MG tablet Take 10 tablets by mouth once weekly for 12 weeks Caution:Chemotherapy. Protect from light. Patient taking differently: Take 10 tablets by mouth once weekly for 12 weeks (Mondays) 03/11/17  Yes Vanga, RTally Due MD      PHYSICAL EXAMINATION:   VITAL SIGNS: Blood pressure 127/89, pulse (!) 116, temperature 98.9 F (37.2 C), temperature source Oral, resp. rate 13, height 5' 5"  (1.651 m), weight 61.2 kg (135 lb), SpO2 99 %.  GENERAL:  32y.o.-year-old patient lying in the bed with no acute distress.  EYES: Pupils equal, round, reactive to light and accommodation. No scleral icterus. Extraocular muscles intact.  HEENT: Head atraumatic, normocephalic. Oropharynx dry and nasopharynx clear.  NECK:  Supple, no jugular venous distention. No thyroid enlargement, no tenderness.  LUNGS: Normal breath sounds bilaterally, no wheezing, rales,rhonchi or crepitation. No use of accessory muscles of respiration.  CARDIOVASCULAR: S1, S2 normal. No murmurs, rubs, or gallops.  ABDOMEN: Soft, tenderness around umbilical area, nondistended. Bowel sounds present. No organomegaly or mass.  EXTREMITIES: No pedal edema, cyanosis, or clubbing.  NEUROLOGIC: Cranial nerves II through XII are intact. Muscle strength 5/5 in all extremities. Sensation intact. Gait not checked.  PSYCHIATRIC:  The patient is alert and oriented x 3.  SKIN: No obvious rash, lesion, or ulcer.   LABORATORY PANEL:   CBC Recent Labs  Lab 04/30/17 1445  WBC 21.8*  HGB 17.4  HCT 51.6  PLT 239  MCV 84.4  MCH 28.4  MCHC 33.6  RDW 14.1  LYMPHSABS 0.4*  MONOABS 0.4  EOSABS 0.1  BASOSABS 0.0   ------------------------------------------------------------------------------------------------------------------  Chemistries  Recent Labs  Lab 04/30/17 1445 04/30/17 1600  NA 140  --   K 3.8  --   CL 106  --   CO2 22  --   GLUCOSE 138*  --   BUN 19  --   CREATININE 0.84  --   CALCIUM 9.2  --   AST  --  26  ALT  --  23  ALKPHOS  --  86  BILITOT  --  1.7*   ------------------------------------------------------------------------------------------------------------------ estimated creatinine clearance is 109.3 mL/min (by C-G formula based on SCr of 0.84 mg/dL). ------------------------------------------------------------------------------------------------------------------ No results for input(s): TSH, T4TOTAL, T3FREE, THYROIDAB in the last 72 hours.  Invalid input(s): FREET3   Coagulation profile No results for input(s): INR, PROTIME  in the last 168 hours. ------------------------------------------------------------------------------------------------------------------- No results for input(s): DDIMER in the last 72 hours. -------------------------------------------------------------------------------------------------------------------  Cardiac Enzymes Recent Labs  Lab 04/30/17 1600  TROPONINI <0.03   ------------------------------------------------------------------------------------------------------------------ Invalid input(s): POCBNP  ---------------------------------------------------------------------------------------------------------------  Urinalysis    Component Value Date/Time   COLORURINE YELLOW (A) 03/05/2017 1927   APPEARANCEUR CLEAR (A) 03/05/2017 1927    APPEARANCEUR Hazy 03/14/2013 1455   LABSPEC 1.026 03/05/2017 1927   LABSPEC 1.019 03/14/2013 1455   PHURINE 5.0 03/05/2017 1927   GLUCOSEU NEGATIVE 03/05/2017 1927   GLUCOSEU Negative 03/14/2013 1455   HGBUR NEGATIVE 03/05/2017 1927   BILIRUBINUR NEGATIVE 03/05/2017 1927   BILIRUBINUR Negative 03/14/2013 1455   KETONESUR 20 (A) 03/05/2017 1927   PROTEINUR NEGATIVE 03/05/2017 1927   NITRITE NEGATIVE 03/05/2017 1927   LEUKOCYTESUR NEGATIVE 03/05/2017 1927   LEUKOCYTESUR Negative 03/14/2013 1455     RADIOLOGY: Ct Abdomen Pelvis W Contrast  Result Date: 04/30/2017 CLINICAL DATA:  Severe abdominal pain. History of Crohn disease. Nausea and vomiting with diarrhea. EXAM: CT ABDOMEN AND PELVIS WITH CONTRAST TECHNIQUE: Multidetector CT imaging of the abdomen and pelvis was performed using the standard protocol following bolus administration of intravenous contrast. CONTRAST:  166m ISOVUE-300 IOPAMIDOL (ISOVUE-300) INJECTION 61% COMPARISON:  06/22/2016 FINDINGS: Lower chest: Unremarkable. Hepatobiliary: No focal abnormality within the liver parenchyma. Tiny calcified stones noted in the gallbladder. No intrahepatic or extrahepatic biliary dilation. Pancreas: No focal mass lesion. No dilatation of the main duct. No intraparenchymal cyst. No peripancreatic edema. Spleen: No splenomegaly. No focal mass lesion. Adrenals/Urinary Tract: No adrenal nodule or mass. Stable tiny hypodensity in the interpolar right kidney compatible with a cyst. Similar tiny hypodensity in the upper pole the left kidney is also stable. No evidence for hydroureter. The urinary bladder appears normal for the degree of distention. Stomach/Bowel: Tiny hiatal hernia. Stomach is nondistended. No gastric wall thickening. No evidence of outlet obstruction. Duodenum is normally positioned as is the ligament of Treitz. No small bowel wall thickening. No small bowel dilatation. Status post right hemicolectomy. Colon is diffusely fluid-filled  to the level of the rectum. Rectum is not well distended, but a degree of wall thickening in the rectum is not excluded. Overall imaging features are similar to prior and tiny perirectal lymph nodes are stable when comparing to the study of almost 1 year ago. There is a band of soft tissue tracking from the right aspect of the rectum through the ischial anal fat to the skin along the right side of the intergluteal fold. This is also stable. Vascular/Lymphatic: No abdominal aortic aneurysm. No abdominal aortic atherosclerotic calcification. There is no gastrohepatic or hepatoduodenal ligament lymphadenopathy. No intraperitoneal or retroperitoneal lymphadenopathy. No pelvic sidewall lymphadenopathy. Reproductive: The prostate gland and seminal vesicles have normal imaging features. Other: No intraperitoneal free fluid. Musculoskeletal: Bone windows reveal no worrisome lytic or sclerotic osseous lesions. IMPRESSION: 1. Fluid-filled colon compatible with the reported clinical history of diarrhea. No small bowel wall thickening. Question minimal wall thickening in the rectum although this region is not well distended which could account for the wall thickening. 2. Similar appearance of a soft tissue tract from the right aspect of the rectum to the skin along the right aspect of the intergluteal fold, compatible with fistula/scar. 3. Cholelithiasis. Electronically Signed   By: EMisty StanleyM.D.   On: 04/30/2017 17:13    EKG: Orders placed or performed during the hospital encounter of 04/30/17  . ED EKG  . ED EKG    IMPRESSION AND PLAN: 32year old  younger male patient with history of Crohn's disease, bronchial asthma, rectal fistula presented to the emergency room with abdominal pain, nausea vomiting and diarrhea.  1.  Acute Crohn's disease exacerbation Admit patient to medical floor  IV fluid hydration Hemoglobin and hematocrit monitoring Start patient on IV Levaquin and IV Flagyl antibiotics IV  Solu-Medrol 60 mg daily Gastroenterology consultation  2.  Dehydration IV fluid hydration  3.  Intractable nausea and vomiting Antiemetics intravenously  4.  Abdominal pain PRN IV morphine for abdominal pain  5.  DVT prophylaxis Early ambulation recommended along with sequential compression device to lower extremities.  All the records are reviewed and case discussed with ED provider. Management plans discussed with the patient, family and they are in agreement.  CODE STATUS:FULL CODE Code Status History    Date Active Date Inactive Code Status Order ID Comments User Context   06/22/2016 2338 06/25/2016 1712 Full Code [id number]  Hugelmeyer, Ubaldo Glassing, DO Inpatient       TOTAL TIME TAKING CARE OF THIS PATIENT: 51 minutes.    Saundra Shelling M.D on 04/30/2017 at 6:21 PM  Between 7am to 6pm - Pager - [phone]  After 6pm go to [email or web address] - password [email or web address] [facility]  [facility]  Office  [phone]  CC: Primary care physician; Leroy Ruths, MD

## 2017-04-30 NOTE — Progress Notes (Addendum)
Md paged regarding pain med

## 2017-04-30 NOTE — ED Provider Notes (Signed)
[facility] Emergency Department Provider Note  Time seen: 4:11 PM  I have reviewed the triage vital signs and the nursing notes.   HISTORY  Chief Complaint Abdominal Pain and Near Syncope    HPI Leroy Hernandez is a 32 y.o. male with a history of anxiety, Crohn's disease who presents to the emergency department for abdominal pain nausea vomiting and diarrhea.  According to the patient he was feeling fine until breakfast this morning.  States he ate breakfast and within an hour began feeling extremely nauseated with fairly diffuse abdominal pain but more across the lower abdomen, positive for diarrhea.  Denies black or bloody stool today.  Patient states he is taking his normal Crohn's regimen which does include steroids per patient.  Feels lightheaded and dizzy today.  Upon arrival patient is nauseated, holding an emesis bag, does appear pale in appearance, but no distress.   Past Medical History:  Diagnosis Date  . Anxiety   . Asthma   . Crohn's disease ([place])   . Depression   . Rectal fistula     Patient Active Problem List   Diagnosis Date Noted  . Iron deficiency anemia 12/28/2016  . Osteoporosis 12/09/2016  . Generalized abdominal pain   . Perirectal fistula   . Sepsis ([place]) 06/22/2016  . Fracture of metacarpal bone 06/09/2016  . Calculus of gallbladder without cholecystitis without obstruction 12/25/2015  . Gallbladder polyp 12/25/2015  . Blood in stool   . Intestinal bypass or anastomosis status   . Crohn's disease of both small and large intestine with rectal bleeding ([place])   . Crohn's disease () 08/22/2015  . Perirectal abscess 07/04/2015  . Mild episode of recurrent major depressive disorder ([place]) 04/02/2015  . Asthma, mild intermittent 04/02/2015  . Crohn's disease of colon ([place]) 02/06/2015    Past Surgical History:  Procedure Laterality Date  . COLON RESECTION    . COLON SURGERY    . COLONOSCOPY WITH PROPOFOL N/A 04/03/2015    Procedure: COLONOSCOPY WITH PROPOFOL;  Surgeon: Hulen Luster, MD;  Location: [facility] ENDOSCOPY;  Service: Endoscopy;  Laterality: N/A;  . COLONOSCOPY WITH PROPOFOL N/A 12/22/2015   Procedure: COLONOSCOPY WITH PROPOFOL;  Surgeon: [name], MD;  Location: [place];  Service: Endoscopy;  Laterality: N/A;  . COLONOSCOPY WITH PROPOFOL N/A 12/22/2016   Procedure: COLONOSCOPY WITH PROPOFOL;  Surgeon: Lin Landsman, MD;  Location: [place];  Service: Endoscopy;  Laterality: N/A;  . ESOPHAGOGASTRODUODENOSCOPY (EGD) WITH PROPOFOL N/A 12/22/2016   Procedure: ESOPHAGOGASTRODUODENOSCOPY (EGD) WITH PROPOFOL;  Surgeon: Lin Landsman, MD;  Location: [place];  Service: Endoscopy;  Laterality: N/A;  . rectal fistula surgery    . WRIST SURGERY      Prior to Admission medications   Medication Sig Start Date End Date Taking? Authorizing Provider  ARIPiprazole (ABILIFY) 5 MG tablet Take 1 tablet (5 mg total) by mouth at bedtime. 04/18/17   Ursula Alert, MD  budesonide (ENTOCORT EC) 3 MG 24 hr capsule Take by mouth daily.    [provider]  busPIRone (BUSPAR) 5 MG tablet Take 1 tablet (5 mg total) by mouth 2 (two) times daily. 04/18/17   Ursula Alert, MD  ergocalciferol (VITAMIN D2) 50000 units capsule Take 1 capsule (50,000 Units total) by mouth 3 (three) times a week. 04/04/17 05/03/17  Renato Shin, MD  escitalopram (LEXAPRO) 10 MG tablet Take 1 tablet (10 mg total) by mouth daily. 04/18/17   Ursula Alert, MD  hydrocortisone [place])  100 MG/60ML enema Place 1 enema (100 mg total) at bedtime for 28 days rectally. Patient not taking: Reported on 03/05/2017 12/13/16 01/10/17  Lin Landsman, MD  hydrOXYzine (VISTARIL) 25 MG capsule Take 1 capsule (25 mg total) by mouth daily as needed (severe anxiety attacks). 04/18/17   Ursula Alert, MD  inFLIXimab (REMICADE) 100 MG injection Start 24m/kg IV on weeks 0,2,6 then continue every 8 weeks thereafter 01/10/17    VLin Landsman MD  methotrexate (RHEUMATREX) 2.5 MG tablet Take 10 tablets by mouth once weekly for 12 weeks Caution:Chemotherapy. Protect from light. 03/11/17   VLin Landsman MD  omeprazole (PRILOSEC) 20 MG capsule Take 1 capsule (20 mg total) by mouth daily before breakfast. Patient not taking: Reported on 03/05/2017 11/11/16 12/28/16  VLin Landsman MD    No Known Allergies  Family History  Problem Relation Age of Onset  . Diabetes Paternal Grandfather   . Heart disease Paternal Grandfather   . Depression Mother   . Drug abuse Mother   . Osteoporosis Neg Hx     Social History Social History   Tobacco Use  . Smoking status: Never Smoker  . Smokeless tobacco: Former UNetwork engineerUse Topics  . Alcohol use: No    Frequency: Never  . Drug use: No    Comment: hx of 3 year ago     Review of Systems Constitutional: Negative for fever.  Describes lightheadedness today. Eyes: Negative for visual complaints ENT: States mild congestion over the past several days. Cardiovascular: Negative for chest pain. Respiratory: Negative for shortness of breath. Gastrointestinal: Positive for abdominal pain especially across the lower abdomen, moderate dull pain.  Positive for nausea and vomiting.  Positive for diarrhea.  Denies black or bloody stool. Genitourinary: Negative for urinary compaints Musculoskeletal: Negative for musculoskeletal complaints Skin: Negative for skin complaints  Neurological: Negative for headache All other ROS negative  ____________________________________________   PHYSICAL EXAM:  Constitutional: Alert and oriented. Well appearing and in no distress. Eyes: Normal exam ENT   Head: Normocephalic and atraumatic.   Mouth/Throat: Mucous membranes are moist. Cardiovascular: Regular rhythm, rate around 100-120 bpm.  No obvious murmur. Respiratory: Normal respiratory effort without tachypnea nor retractions. Breath sounds are clear   Gastrointestinal: Soft and nontender. No distention.  Musculoskeletal: Nontender with normal range of motion in all extremities. No lower extremity tenderness or edema. Neurologic:  Normal speech and language. No gross focal neurologic deficits Skin:  Skin is warm, dry, but somewhat pale in appearance. Psychiatric: Mood and affect are normal. Speech and behavior are normal.   ____________________________________________    EKG  EKG reviewed and interpreted by myself shows sinus tachycardia 121 bpm with a narrow QRS, normal axis, normal intervals, nonspecific ST changes without ST elevation.  ____________________________________________    RADIOLOGY  CT shows fluid-filled colon.  ____________________________________________   INITIAL IMPRESSION / ASSESSMENT AND PLAN / ED COURSE  Pertinent labs & imaging results that were available during my care of the patient were reviewed by me and considered in my medical decision making (see chart for details).  She presents emergency department for abdominal discomfort nausea vomiting and diarrhea.  Differential would include Crohn's flare, colitis, enteritis, gastroenteritis, intra-abdominal pathology/infectious etiology.  We will check labs, CT scan, treat pain, nausea, IV hydrate while awaiting results.  Labs have resulted showing a significant leukocytosis of 21,000.  Patient does state he is taking steroids, but does not recall the name of them.  States he was taking prednisone at  one point but stopped taking that because of the side effects.  We will proceed with CT scan and continue with IV hydration and pain and nausea management as needed.  CT shows fluid-filled colon.  Given the patient's leukocytosis significant nausea vomiting and diarrhea I discussed the patient with gastroenterology, they recommend obtaining C. difficile and stool antigen testing.  If C. difficile is negative they recommend starting high-dose steroids in addition IV  fluids and admission to the hospital.  Patient agreeable to this plan of care.  We will discuss with the hospitalist service for admission.  ____________________________________________   FINAL CLINICAL IMPRESSION(S) / ED DIAGNOSES  Nausea vomiting diarrhea Crohn's flare Abdominal pain    Harvest Dark, MD 04/30/17 1742

## 2017-04-30 NOTE — ED Notes (Signed)
Pt given cup of ice water

## 2017-05-01 LAB — GASTROINTESTINAL PANEL BY PCR, STOOL (REPLACES STOOL CULTURE)
ADENOVIRUS F40/41: NOT DETECTED
Astrovirus: NOT DETECTED
CAMPYLOBACTER SPECIES: NOT DETECTED
CRYPTOSPORIDIUM: NOT DETECTED
CYCLOSPORA CAYETANENSIS: NOT DETECTED
ENTEROPATHOGENIC E COLI (EPEC): NOT DETECTED
Entamoeba histolytica: NOT DETECTED
Enteroaggregative E coli (EAEC): NOT DETECTED
Enterotoxigenic E coli (ETEC): NOT DETECTED
Giardia lamblia: NOT DETECTED
Norovirus GI/GII: DETECTED — AB
PLESIMONAS SHIGELLOIDES: NOT DETECTED
ROTAVIRUS A: NOT DETECTED
SAPOVIRUS (I, II, IV, AND V): NOT DETECTED
Salmonella species: NOT DETECTED
Shiga like toxin producing E coli (STEC): NOT DETECTED
Shigella/Enteroinvasive E coli (EIEC): NOT DETECTED
VIBRIO SPECIES: NOT DETECTED
Vibrio cholerae: NOT DETECTED
YERSINIA ENTEROCOLITICA: NOT DETECTED

## 2017-05-01 LAB — CBC
HCT: 41.8 % (ref 40.0–52.0)
Hemoglobin: 14.1 g/dL (ref 13.0–18.0)
MCH: 29 pg (ref 26.0–34.0)
MCHC: 33.7 g/dL (ref 32.0–36.0)
MCV: 85.9 fL (ref 80.0–100.0)
PLATELETS: 191 10*3/uL (ref 150–440)
RBC: 4.86 MIL/uL (ref 4.40–5.90)
RDW: 14.3 % (ref 11.5–14.5)
WBC: 12 10*3/uL — AB (ref 3.8–10.6)

## 2017-05-01 LAB — BASIC METABOLIC PANEL
ANION GAP: 8 (ref 5–15)
BUN: 15 mg/dL (ref 6–20)
CALCIUM: 8 mg/dL — AB (ref 8.9–10.3)
CO2: 23 mmol/L (ref 22–32)
Chloride: 107 mmol/L (ref 101–111)
Creatinine, Ser: 0.84 mg/dL (ref 0.61–1.24)
GFR calc Af Amer: >60 mL/min (ref >60)
GLUCOSE: 122 mg/dL — AB (ref 65–99)
Potassium: 4 mmol/L (ref 3.5–5.1)
Sodium: 138 mmol/L (ref 135–145)

## 2017-05-01 LAB — C DIFFICILE QUICK SCREEN W PCR REFLEX
C Diff antigen: POSITIVE — AB
C Diff toxin: NEGATIVE

## 2017-05-01 LAB — CLOSTRIDIUM DIFFICILE BY PCR, REFLEXED: Toxigenic C. Difficile by PCR: POSITIVE — AB

## 2017-05-01 MED ORDER — VANCOMYCIN 50 MG/ML ORAL SOLUTION
500.0000 mg | Freq: Four times a day (QID) | ORAL | Status: DC
  Administered 2017-05-01: 500 mg via ORAL
  Filled 2017-05-01 (×3): qty 10

## 2017-05-01 MED ORDER — VANCOMYCIN HCL 125 MG PO CAPS: 125.0000 mg | ORAL_CAPSULE | Freq: Four times a day (QID) | ORAL | 0 refills | Status: AC

## 2017-05-01 NOTE — Discharge Summary (Signed)
[facility] [place] NAME: Leroy Hernandez    MR#:  [id number]  DATE OF BIRTH:  [birth year]  DATE OF ADMISSION:  04/30/2017 ADMITTING PHYSICIAN: Saundra Shelling, MD  DATE OF DISCHARGE: 05/01/2017  PRIMARY CARE PHYSICIAN: [name], MD    ADMISSION DIAGNOSIS:  Dehydration [E86.0] Crohn's disease of colon with complication ([place]) [P50.932]  DISCHARGE DIAGNOSIS:  Active Problems:   C diff diarrhea and NOROVIRUS  SECONDARY DIAGNOSIS:   Past Medical History:  Diagnosis Date  . Anxiety   . Asthma   . Crohn's disease ([place])   . Depression   . Rectal fistula     HOSPITAL COURSE:   32 year old male with history of Crohn's disease who presents with diarrhea.  1.  Sepsis due to C. difficile and Norovirus diarrhea: Patient presents with leukocytosis and tachycardia change to vancomycin  2.  Crohn's disease: I did not suspect flare given positive stool studies. He can continue outpatient medications and have outpatient follow up.     DISCHARGE CONDITIONS AND DIET:   Stable Regular diet  CONSULTS OBTAINED:  Treatment Team:  Virgel Manifold, MD  DRUG ALLERGIES:  No Known Allergies  DISCHARGE MEDICATIONS:   Allergies as of 05/01/2017   No Known Allergies     Medication List    TAKE these medications   ARIPiprazole 5 MG tablet Commonly known as:  ABILIFY Take 1 tablet (5 mg total) by mouth at bedtime.   budesonide 3 MG 24 hr capsule Commonly known as:  ENTOCORT EC Take 9 mg by mouth daily.   busPIRone 5 MG tablet Commonly known as:  BUSPAR Take 1 tablet (5 mg total) by mouth 2 (two) times daily.   ergocalciferol 50000 units capsule Commonly known as:  VITAMIN D2 Take 1 capsule (50,000 Units total) by mouth 3 (three) times a week.   escitalopram 10 MG tablet Commonly known as:  LEXAPRO Take 1 tablet (10 mg total) by mouth daily.   folic acid 1 MG tablet Commonly known as:  FOLVITE Take 1 mg by  mouth daily.   hydrOXYzine 25 MG capsule Commonly known as:  VISTARIL Take 1 capsule (25 mg total) by mouth daily as needed (severe anxiety attacks).   inFLIXimab 100 MG injection Commonly known as:  REMICADE Start 83m/kg IV on weeks 0,2,6 then continue every 8 weeks thereafter   methotrexate 2.5 MG tablet Commonly known as:  RHEUMATREX Take 10 tablets by mouth once weekly for 12 weeks Caution:Chemotherapy. Protect from light. What changed:  additional instructions   vancomycin 125 MG capsule Commonly known as:  VANCOCIN Take 1 capsule (125 mg total) by mouth 4 (four) times daily for 13 days.         Today   CHIEF COMPLAINT:  Doing better this am no nausea or abdomen pain Diarrhea better Does not feel like typical flare   VITAL SIGNS:  Blood pressure 99/64, pulse 84, temperature 98.1 F (36.7 C), temperature source Oral, resp. rate 16, height 5' 5"  (1.651 m), weight 61.2 kg (135 lb), SpO2 97 %.   REVIEW OF SYSTEMS:  Review of Systems  Constitutional: Negative.  Negative for chills, fever and malaise/fatigue.  HENT: Negative.  Negative for ear discharge, ear pain, hearing loss, nosebleeds and sore throat.   Eyes: Negative.  Negative for blurred vision and pain.  Respiratory: Negative.  Negative for cough, hemoptysis, shortness of breath and wheezing.   Cardiovascular: Negative.  Negative for chest pain, palpitations and leg  swelling.  Gastrointestinal: Positive for diarrhea (better). Negative for abdominal pain, blood in stool, nausea and vomiting.  Genitourinary: Negative.  Negative for dysuria.  Musculoskeletal: Negative.  Negative for back pain.  Skin: Negative.   Neurological: Negative for dizziness, tremors, speech change, focal weakness, seizures and headaches.  Endo/Heme/Allergies: Negative.  Does not bruise/bleed easily.  Psychiatric/Behavioral: Negative.  Negative for depression, hallucinations and suicidal ideas.     PHYSICAL EXAMINATION:  GENERAL:  32  y.o.-year-old patient lying in the bed with no acute distress.  NECK:  Supple, no jugular venous distention. No thyroid enlargement, no tenderness.  LUNGS: Normal breath sounds bilaterally, no wheezing, rales,rhonchi  No use of accessory muscles of respiration.  CARDIOVASCULAR: S1, S2 normal. No murmurs, rubs, or gallops.  ABDOMEN: Soft, non-tender, non-distended. Bowel sounds present. No organomegaly or mass.  EXTREMITIES: No pedal edema, cyanosis, or clubbing.  PSYCHIATRIC: The patient is alert and oriented x 3.  SKIN: No obvious rash, lesion, or ulcer.   DATA REVIEW:   CBC Recent Labs  Lab 05/01/17 0543  WBC 12.0*  HGB 14.1  HCT 41.8  PLT 191    Chemistries  Recent Labs  Lab 04/30/17 1600 05/01/17 0543  NA  --  138  K  --  4.0  CL  --  107  CO2  --  23  GLUCOSE  --  122*  BUN  --  15  CREATININE  --  0.84  CALCIUM  --  8.0*  AST 26  --   ALT 23  --   ALKPHOS 86  --   BILITOT 1.7*  --     Cardiac Enzymes Recent Labs  Lab 04/30/17 1600  TROPONINI <0.03    Microbiology Results  @[email or web address] @  RADIOLOGY:  Ct Abdomen Pelvis W Contrast  Result Date: 04/30/2017 CLINICAL DATA:  Severe abdominal pain. History of Crohn disease. Nausea and vomiting with diarrhea. EXAM: CT ABDOMEN AND PELVIS WITH CONTRAST TECHNIQUE: Multidetector CT imaging of the abdomen and pelvis was performed using the standard protocol following bolus administration of intravenous contrast. CONTRAST:  150m ISOVUE-300 IOPAMIDOL (ISOVUE-300) INJECTION 61% COMPARISON:  06/22/2016 FINDINGS: Lower chest: Unremarkable. Hepatobiliary: No focal abnormality within the liver parenchyma. Tiny calcified stones noted in the gallbladder. No intrahepatic or extrahepatic biliary dilation. Pancreas: No focal mass lesion. No dilatation of the main duct. No intraparenchymal cyst. No peripancreatic edema. Spleen: No splenomegaly. No focal mass lesion. Adrenals/Urinary Tract: No adrenal nodule or mass. Stable tiny  hypodensity in the interpolar right kidney compatible with a cyst. Similar tiny hypodensity in the upper pole the left kidney is also stable. No evidence for hydroureter. The urinary bladder appears normal for the degree of distention. Stomach/Bowel: Tiny hiatal hernia. Stomach is nondistended. No gastric wall thickening. No evidence of outlet obstruction. Duodenum is normally positioned as is the ligament of Treitz. No small bowel wall thickening. No small bowel dilatation. Status post right hemicolectomy. Colon is diffusely fluid-filled to the level of the rectum. Rectum is not well distended, but a degree of wall thickening in the rectum is not excluded. Overall imaging features are similar to prior and tiny perirectal lymph nodes are stable when comparing to the study of almost 1 year ago. There is a band of soft tissue tracking from the right aspect of the rectum through the ischial anal fat to the skin along the right side of the intergluteal fold. This is also stable. Vascular/Lymphatic: No abdominal aortic aneurysm. No abdominal aortic atherosclerotic calcification. There is no gastrohepatic or  hepatoduodenal ligament lymphadenopathy. No intraperitoneal or retroperitoneal lymphadenopathy. No pelvic sidewall lymphadenopathy. Reproductive: The prostate gland and seminal vesicles have normal imaging features. Other: No intraperitoneal free fluid. Musculoskeletal: Bone windows reveal no worrisome lytic or sclerotic osseous lesions. IMPRESSION: 1. Fluid-filled colon compatible with the reported clinical history of diarrhea. No small bowel wall thickening. Question minimal wall thickening in the rectum although this region is not well distended which could account for the wall thickening. 2. Similar appearance of a soft tissue tract from the right aspect of the rectum to the skin along the right aspect of the intergluteal fold, compatible with fistula/scar. 3. Cholelithiasis. Electronically Signed   By: Misty Stanley M.D.   On: 04/30/2017 17:13      Allergies as of 05/01/2017   No Known Allergies     Medication List    TAKE these medications   ARIPiprazole 5 MG tablet Commonly known as:  ABILIFY Take 1 tablet (5 mg total) by mouth at bedtime.   budesonide 3 MG 24 hr capsule Commonly known as:  ENTOCORT EC Take 9 mg by mouth daily.   busPIRone 5 MG tablet Commonly known as:  BUSPAR Take 1 tablet (5 mg total) by mouth 2 (two) times daily.   ergocalciferol 50000 units capsule Commonly known as:  VITAMIN D2 Take 1 capsule (50,000 Units total) by mouth 3 (three) times a week.   escitalopram 10 MG tablet Commonly known as:  LEXAPRO Take 1 tablet (10 mg total) by mouth daily.   folic acid 1 MG tablet Commonly known as:  FOLVITE Take 1 mg by mouth daily.   hydrOXYzine 25 MG capsule Commonly known as:  VISTARIL Take 1 capsule (25 mg total) by mouth daily as needed (severe anxiety attacks).   inFLIXimab 100 MG injection Commonly known as:  REMICADE Start 22m/kg IV on weeks 0,2,6 then continue every 8 weeks thereafter   methotrexate 2.5 MG tablet Commonly known as:  RHEUMATREX Take 10 tablets by mouth once weekly for 12 weeks Caution:Chemotherapy. Protect from light. What changed:  additional instructions   vancomycin 125 MG capsule Commonly known as:  VANCOCIN Take 1 capsule (125 mg total) by mouth 4 (four) times daily for 13 days.         Management plans discussed with the patient and he is in agreement. Stable for discharge home  Patient should follow up with pcp  CODE STATUS:     Code Status Orders  (From admission, onward)        Start     Ordered   04/30/17 2004  Full code  Continuous     04/30/17 2003    Code Status History    Date Active Date Inactive Code Status Order ID Comments User Context   06/22/2016 2338 06/25/2016 1712 Full Code [id number] Hugelmeyer, AUbaldo Glassing DO Inpatient      TOTAL TIME TAKING CARE OF THIS PATIENT: 38 minutes.    Note:  This dictation was prepared with Dragon dictation along with smaller phrase technology. Any transcriptional errors that result from this process are unintentional.  Arzella Rehmann M.D on 05/01/2017 at 12:42 PM  Between 7am to 6pm - Pager - [phone] After 6pm go to [email or web address] - password [email or web address]  Office  [phone] CC: Primary care physician; AKirk Ruths MD

## 2017-05-01 NOTE — Progress Notes (Signed)
Pharmacy Antibiotic Note  Leroy Hernandez is a 32 y.o. male admitted on 04/30/2017 with closteroides.  Pharmacy has been consulted for vancomycin dosing.  Plan: Vancomycin 500 mg po Q6H x 10 days   Height: 5' 5"  (165.1 cm) Weight: 135 lb (61.2 kg) IBW/kg (Calculated) : 61.5  Temp (24hrs), Avg:98.6 F (37 C), Min:98.1 F (36.7 C), Max:98.9 F (37.2 C)  Recent Labs  Lab 04/30/17 1445 05/01/17 0543  WBC 21.8* 12.0*  CREATININE 0.84 0.84    Estimated Creatinine Clearance: 109.3 mL/min (by C-G formula based on SCr of 0.84 mg/dL).    No Known Allergies  Antimicrobials this admission:   Dose adjustments this admission:   Microbiology results:  BCx:   UCx:    Sputum:    MRSA PCR:   Thank you for allowing pharmacy to be a part of this patient's care.  Laural Benes, Pharm.D., BCPS Clinical Pharmacist 05/01/2017 12:33 PM

## 2017-05-01 NOTE — Progress Notes (Signed)
Home now, has Rx for po vancomycin.  To return to work Wed. 05/04/17.  F/U w/GI dr [name] in 1 wk.

## 2017-05-01 NOTE — Progress Notes (Signed)
[facility] [place] NAME: Leroy Hernandez    MR#:  [id number]  DATE OF BIRTH:  1985/11/07  SUBJECTIVE:   Diarrhea with pus no abdominal pain Nausea better  REVIEW OF SYSTEMS:    Review of Systems  Constitutional: Negative for fever, chills weight loss HENT: Negative for ear pain, nosebleeds, congestion, facial swelling, rhinorrhea, neck pain, neck stiffness and ear discharge.   Respiratory: Negative for cough, shortness of breath, wheezing  Cardiovascular: Negative for chest pain, palpitations and leg swelling.  Gastrointestinal: Negative for heartburn, abdominal pain, vomiting, diarrhea or consitpation Genitourinary: Negative for dysuria, urgency, frequency, hematuria Musculoskeletal: Negative for back pain or joint pain Neurological: Negative for dizziness, seizures, syncope, focal weakness,  numbness and headaches.  Hematological: Does not bruise/bleed easily.  Psychiatric/Behavioral: Negative for hallucinations, confusion, dysphoric mood    Tolerating Diet: yes      DRUG ALLERGIES:  No Known Allergies  VITALS:  Blood pressure 99/64, pulse 84, temperature 98.1 F (36.7 C), temperature source Oral, resp. rate 16, height 5' 5"  (1.651 m), weight 61.2 kg (135 lb), SpO2 97 %.  PHYSICAL EXAMINATION:  Constitutional: Appears well-developed and well-nourished. No distress. HENT: Normocephalic. Marland Kitchen Oropharynx is clear and moist.  Eyes: Conjunctivae and EOM are normal. PERRLA, no scleral icterus.  Neck: Normal ROM. Neck supple. No JVD. No tracheal deviation. CVS: RRR, S1/S2 +, no murmurs, no gallops, no carotid bruit.  Pulmonary: Effort and breath sounds normal, no stridor, rhonchi, wheezes, rales.  Abdominal: Soft. BS +,  no distension, tenderness, rebound or guarding.  Musculoskeletal: Normal range of motion. No edema and no tenderness.  Neuro: Alert. CN 2-12 grossly intact. No focal deficits. Skin: Skin is warm and dry. No rash  noted. Psychiatric: Normal mood and affect.      LABORATORY PANEL:   CBC Recent Labs  Lab 05/01/17 0543  WBC 12.0*  HGB 14.1  HCT 41.8  PLT 191   ------------------------------------------------------------------------------------------------------------------  Chemistries  Recent Labs  Lab 04/30/17 1600 05/01/17 0543  NA  --  138  K  --  4.0  CL  --  107  CO2  --  23  GLUCOSE  --  122*  BUN  --  15  CREATININE  --  0.84  CALCIUM  --  8.0*  AST 26  --   ALT 23  --   ALKPHOS 86  --   BILITOT 1.7*  --    ------------------------------------------------------------------------------------------------------------------  Cardiac Enzymes Recent Labs  Lab 04/30/17 1600  TROPONINI <0.03   ------------------------------------------------------------------------------------------------------------------  RADIOLOGY:  Ct Abdomen Pelvis W Contrast  Result Date: 04/30/2017 CLINICAL DATA:  Severe abdominal pain. History of Crohn disease. Nausea and vomiting with diarrhea. EXAM: CT ABDOMEN AND PELVIS WITH CONTRAST TECHNIQUE: Multidetector CT imaging of the abdomen and pelvis was performed using the standard protocol following bolus administration of intravenous contrast. CONTRAST:  167m ISOVUE-300 IOPAMIDOL (ISOVUE-300) INJECTION 61% COMPARISON:  06/22/2016 FINDINGS: Lower chest: Unremarkable. Hepatobiliary: No focal abnormality within the liver parenchyma. Tiny calcified stones noted in the gallbladder. No intrahepatic or extrahepatic biliary dilation. Pancreas: No focal mass lesion. No dilatation of the main duct. No intraparenchymal cyst. No peripancreatic edema. Spleen: No splenomegaly. No focal mass lesion. Adrenals/Urinary Tract: No adrenal nodule or mass. Stable tiny hypodensity in the interpolar right kidney compatible with a cyst. Similar tiny hypodensity in the upper pole the left kidney is also stable. No evidence for hydroureter. The urinary bladder appears normal for  the degree of distention.  Stomach/Bowel: Tiny hiatal hernia. Stomach is nondistended. No gastric wall thickening. No evidence of outlet obstruction. Duodenum is normally positioned as is the ligament of Treitz. No small bowel wall thickening. No small bowel dilatation. Status post right hemicolectomy. Colon is diffusely fluid-filled to the level of the rectum. Rectum is not well distended, but a degree of wall thickening in the rectum is not excluded. Overall imaging features are similar to prior and tiny perirectal lymph nodes are stable when comparing to the study of almost 1 year ago. There is a band of soft tissue tracking from the right aspect of the rectum through the ischial anal fat to the skin along the right side of the intergluteal fold. This is also stable. Vascular/Lymphatic: No abdominal aortic aneurysm. No abdominal aortic atherosclerotic calcification. There is no gastrohepatic or hepatoduodenal ligament lymphadenopathy. No intraperitoneal or retroperitoneal lymphadenopathy. No pelvic sidewall lymphadenopathy. Reproductive: The prostate gland and seminal vesicles have normal imaging features. Other: No intraperitoneal free fluid. Musculoskeletal: Bone windows reveal no worrisome lytic or sclerotic osseous lesions. IMPRESSION: 1. Fluid-filled colon compatible with the reported clinical history of diarrhea. No small bowel wall thickening. Question minimal wall thickening in the rectum although this region is not well distended which could account for the wall thickening. 2. Similar appearance of a soft tissue tract from the right aspect of the rectum to the skin along the right aspect of the intergluteal fold, compatible with fistula/scar. 3. Cholelithiasis. Electronically Signed   By: Misty Stanley M.D.   On: 04/30/2017 17:13     ASSESSMENT AND PLAN:   32 year old male with history of Crohn's disease who presents with diarrhea.  1.  Sepsis due to C. difficile and Norovirus diarrhea: Patient  presents with leukocytosis and tachycardia change to vancomycin  2.  Crohn's disease: I am not suspecting flare at this point Management as per GI consultant Continue steroids for now      Management plans discussed with the patient and he is in agreement.  CODE STATUS: full  TOTAL TIME TAKING CARE OF THIS PATIENT: 30 minutes.     POSSIBLE D/C tomorrow, DEPENDING ON CLINICAL CONDITION.   Lorik Guo M.D on 05/01/2017 at 12:32 PM  Between 7am to 6pm - Pager - [phone] After 6pm go to [email or web address] - password [email or web address] [facility]  Office  [phone]  CC: Primary care physician; [name], MD  Note: This dictation was prepared with Dragon dictation along with smaller phrase technology. Any transcriptional errors that result from this process are unintentional.

## 2017-05-03 ENCOUNTER — Ambulatory Visit: Payer: BC Managed Care – PPO | Admitting: Gastroenterology

## 2017-05-03 ENCOUNTER — Telehealth: Payer: Self-pay | Admitting: Psychiatry

## 2017-05-03 ENCOUNTER — Encounter: Payer: Self-pay | Admitting: Gastroenterology

## 2017-05-03 VITALS — BP 106/71 | HR 81 | Temp 98.4°F | Ht 65.0 in | Wt 133.0 lb

## 2017-05-03 DIAGNOSIS — A0811 Acute gastroenteropathy due to Norwalk agent: Secondary | ICD-10-CM | POA: Insufficient documentation

## 2017-05-03 DIAGNOSIS — Z8619 Personal history of other infectious and parasitic diseases: Secondary | ICD-10-CM | POA: Diagnosis not present

## 2017-05-03 DIAGNOSIS — K50012 Crohn's disease of small intestine with intestinal obstruction: Secondary | ICD-10-CM

## 2017-05-03 HISTORY — DX: Acute gastroenteropathy due to Norwalk agent: A08.11

## 2017-05-03 HISTORY — DX: Personal history of other infectious and parasitic diseases: Z86.19

## 2017-05-03 MED ORDER — FOLIC ACID 1 MG PO TABS: 1.0000 mg | ORAL_TABLET | Freq: Every day | ORAL | 2 refills | Status: DC

## 2017-05-03 NOTE — Telephone Encounter (Signed)
Received information from [facility] that possible noncompliance with abilify. Pt however reports he is getting abilify from another pharmacy and is compliant with it.

## 2017-05-03 NOTE — Progress Notes (Signed)
we'll  [name], MD [address]  [place]  [place], [place] [address]  Main: [phone]  Fax: [phone]    Gastroenterology Consultation  Referring Provider:     [name], MD Primary Care Physician:  [name], MD Primary Gastroenterologist:  Dr. [name] Reason for Consultation:     Crohn's disease        HPI:   BRALON ANTKOWIAK is a 32 y.o. y/o male referred by Dr. Ouida Sills, [name], MD  for consultation & management of Crohn's disease. He has history of ileocolonic Crohn's diagnosed in 2007, detailed history described below is referred by [name] to establish care. He is currently on cymzia 400 mg every 2 weeks and Imuran 150 mg daily. Currently, he is experiencing nonbloody diarrhea up to 10 times a day associated with urgency and perianal purulent discharge from the fistula. He also reports tenderness in the right perianal area. He reports left-sided abdominal pain associated with nausea but no vomiting. He denies bloating, blood in the stools. He went to [facility] ER 2 days ago due to severe diarrhea and head is given prescription for oral prednisone but he did not filling the prescription yet. He denies fever, chills, loss of appetite, weight loss. He has been adherent with his medications.   Follow up visit 01/10/2017: Since last visit, I started him on entocort, cipro, cortenema for flare up of his symptoms. He gained about 5lbs since last visit. 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 Remicade process was started. He reports some rectal urgency and minimal rectal bleeding mostly on wiping. When I went over medications with him, he doesn't really know what he is taking and he did not bring his medication bottles with him even though I reminded him during last visit. He is not on entocort. He has seen endocrine and also hematology for iron deficiency anemia and scheduled for iron  infusion today  Follow-up visit 05/03/2017 He started on Remicade, completed induction. Stopped Imuran, started on Weekly methotrexate along with daily folate. He reports that he has been tolerating Remicade infusions very well. He gained weight. His stools are more formed, no recurrence of perianal fistulas. He denies rectal bleeding or bloody diarrhea. She recently had flareup of symptoms, was found to have norovirus as well as C. Difficile toxin positive when he went to emergency room on 04/30/2017. E also has significant leukocytosis. Underwent CT scan which did not show acute GI pathology. He was hospitalized for 1 day, discharged on oral vancomycin 125 mg 4 times a day. Patient is here for follow-up. He reports that his diarrhea is improving, currently having 4 semi-formed bowel movements daily without any blood. His appetite is improving.He reports being adherent to the medications. He denies any other complaints today. He reports that his energy levels are better, able to work longer hours.  Crohn's disease classification:  Age: 63 to 3 Location: ileocolonic  Behavior: stricturing and penetrating  Perianal: Yes  IBD diagnosis:2007  Disease course: He was diagnosed in 2007, was experiencing right lower quadrant pain and bloating, was on oral mesalamine, several courses of prednisone, underwent first surgery in 2011 of transverse colon resection at [facility]. He continued to have diarrhea and required repeated courses of prednisone, established care at [facility] and underwent colonoscopy which revealed severe ileocolonic disease, underwent ileocolonic resection in 11/2011. He reports being started on Humira and Imuran since then and was doing  well for some time. He was also taking marijuana and it significantly helped with his symptoms. Subsequently, he had frequent flareups, several hospitalizations and ER visits almost every month secondary to perianal fistula, abscess and  bloody diarrhea. He was receiving short courses of antibiotics. He had I&D of right perirectal abscesses in May/2017. He does not recall having a seton placement. His colonoscopy in 03/2015 revealed mild active colitis and anastomotic stricture that was dilated with balloon to 12 mm. He underwent another colonoscopy in 12/2015 which revealed inflammation in the neoterminal ileum as well as colon and was found to have moderately active ileocolonic disease. He was then switched to [place] in 05/2016 and stayed on Imuran 150 mg daily. He was feeling better overall but continued to have active perianal disease resulting in urgency and bloody stools. CT A/P from 06/2016 revealed right perirectal fistula with developing fluid collection concerning for abscess. MRE from 09/2016 revealed no evidence of active disease, however the fistula could not be visualized. His CRP improved from 10 in 06/2016 -1.7 in 09/2016. 09/2016 Trial of cipro+flagyl BID and rowasa enema resulted in significant improvement of symptoms. 11/2016 Return of fistula and rectal bleeding after tapering antibiotic. 12/2016 Restarted cipro, entocort, cortenema, decreased imuran to 65m. EGD and colonoscopy revealed gastric granuloma, Rutgeert's i4. Stool studies negative. Fecal calprotectin 370. C. Difficile infection in 04/2017, treated with oral vancomycin  Extra intestinal manifestations: Imaging revealed chronic sacroiliitis. He denies skin, eye manifestations  IBD surgical history: 2011 : Intestinal resection, transverse colon Diagnosis:  TRANSVERSE COLON AND PROXIMAL TRANSVERSE COLON RESECTION:  SEGMENTS OF BOWEL WITH MODERATE TO FOCALLY SEVERE CHRONIC ACTIVE COLITIS.  EXTENSIVE TRANSMURAL INFLAMMATION.  CRYTPITIS AND CRYPT ABSCESSES ARE NOTED.  THE RESECTION MARGINS APPEAR CLEAR.  THERE IS NO EVIDENCE OF DYSPLASIA OR MALIGNANCY.  NINETEEN BENIGN LYMPH NODES. (0/19)  [facility]- 9 cm TI, 5.5 cm long  large bowel Diagnosis:  A:Ileum and colon, resection  -Segment of ileum and colon with moderate to severely active chronic  enteritis, mildly active chronic colitis, and intact anastomosis at distal end  of specimen  -Surgical margins are viable; mildly active chronic colitis is present at  distal margin  -Apparent stricture near ileocecal valve  -Inflammatory polyp near ileocecal valve  -Fibrous obliteration of appendiceal lumen  -Six unremarkable lymph nodes examined microscopically  -No granulomas, viral cytopathic effect, or dysplasia identified   07/04/2015-incision and drainage of right perirectal abscess Imaging:  MRE-09/21/2016 IMPRESSION: 1. Stable surgical changes from prior colon resection and ileal colonic anastomosis. No findings for active Crohn's disease. 2. No acute abdominal/pelvic findings, mass lesions or adenopathy.  CT A/P 06/22/2016 IMPRESSION: 1. The fistula between the right lateral aspect of the rectum and the skin is again identified. The portion of the fistula immediately beneath the skin is more prominent in caliber in the interval with central decreased attenuation worrisome for a developing fluid collection in the distal fistula immediately beneath the cutaneous surface. A developing abscess cannot be excluded. Recommend clinical correlation. 2. Fluid throughout the remaining colon consistent with history of diarrhea. No colonic or small bowel inflammation identified. The rectum is poorly evaluated due to lack of distention. 3. 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  SBFT none  Procedures: Colonoscopy 09/2009 Diagnosis:  TRANSVERSE COLON MUCOSA COLD BIOPSY:  - MODERATE CHRONIC ACTIVE COLITIS, CONSISTENT WITH PATIENTS  HISTORY OF CROHNS DISEASE.  - NEGATIVE FOR DYSPLASIA AND MALIGNANCY.   Colonoscopy 06/2010 Diagnosis:  ULCERATED RIGHT SIDED COLON BIOPSY:  - MILD CHRONIC  ACTIVE COLITIS, COMPATIBLE WITH PATIENTS KNOWN  HISTORY OF CROHNS DISEASE.  - NEGATIVE FOR DYSPLASIA AND MALIGNANCY.   Colonoscopies 07/2012 Diagnosis:  ANASTOMOTIC STRICTURE COLD BIOPSY:  - MILD CHRONIC ACTIVE COLITIS, COMPATIBLE WITH PATIENTS KNOWN  HISTORY OF CROHNS DISEASE.  - NEGATIVE FOR DYSPLASIA AND MALIGNANCY.   Colonoscopies 07/2013 Diagnosis:  ILEUM BIOPSY:  - SMALL BOWEL MUCOSA WITH PRESERVED VILLOUS ARCHITECTURE.  - NEGATIVE FOR INTRAEPITHELIAL LYMPHOCYTOSIS, DYSPLASIA AND  MALIGNANCY.   Colonoscopy 03/2015 Multiple ulcers in the sigmoid colon, no bleeding was present biopsies were taken, tight stricture at the ileocolonic anastomosis unable to get through scope, dilated with 12 mm TTS balloon. The terminal ileum appeared normal DIAGNOSIS:  A. SIGMOID COLON; COLD BIOPSY:  - MILD CHRONIC ACTIVE COLITIS.  - NEGATIVE FOR DYSPLASIA AND MALIGNANCY.   Colonoscopy 12/2015 There was evidence of prior end-to-end ileocolonic anastomosis in the transverse colon. This was characterized by mild stenosis. The anastomosis was traversed. Scattered inflammation, moderate in severity and characterized by shallow ulcerations was found in the proximal ileum. Biopsies were taken with the cold forceps for histology. Inflammation characterized by shallow ulcerations were found no sites were spared. This was moderate in severity. Biopsies were taken. Perianal fistula found on perianal exam. Pathology: Small intestine-severe chronic active ileitis with ulceration. 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 - Patent functional end-to-end ileo-colonic  anastomosis, characterized by mild stenosis. Dilated to 16m. - Ulcerated mucosa in the neo-terminal ileum. Biopsied. - Mucosal ulceration in colon. Biopsied. - Active ileocolonic Crohn's disease, Rutgeert's i4 on cimzia and imuran - The distal rectum and anal verge are normal on retroflexion view.  Upper Endoscopy 12/2016 - Normal second portion of the duodenum and third portion of the duodenum. Biopsied. - Multiple non-bleeding duodenal ulcers with a clean ulcer base (Forrest Class III). Biopsied. - Normal stomach. Biopsied. - Normal gastroesophageal junction and esophagus.   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 Stopped Imuran in 01/2017 TPMT status homozygous Methotrexate: started in 02/2017 along with daily folate  Biologics:  Anti TNFs: Humira until end of 2017, stopped due to recurrence of postoperative Crohn's. Cimzia started in 05/28/2016 along with Imuran. Cimzia stopped in 01/2017. Remicade started in 02/2017 Anti Integrins:  Ustekinumab: Tofactinib: Clinical trial:   Past Medical History:  Diagnosis Date  . Anxiety   . Asthma   . [place]'s [place]  . Depression   . Rectal fistula     Past Surgical History:  Procedure Laterality Date  . COLON RESECTION    . COLON SURGERY    . COLONOSCOPY WITH PROPOFOL N/A 04/03/2015   Procedure: COLONOSCOPY WITH PROPOFOL;  Surgeon: PaHulen LusterMD;  [facility]  Service: Endoscopy;  Laterality: N/A;  . COLONOSCOPY WITH PROPOFOL N/A 12/22/2015   Procedure: COLONOSCOPY WITH PROPOFOL;  Surgeon: DaLucilla LameMD;  [place] [facility]: [place];  Laterality: N/A;  . COLONOSCOPY WITH PROPOFOL N/A 12/22/2016  Procedure: COLONOSCOPY  WITH PROPOFOL;  Surgeon: Lin Landsman, MD;  Location: [place];  Service: Endoscopy;  Laterality: N/A;  . ESOPHAGOGASTRODUODENOSCOPY (EGD) WITH PROPOFOL N/A 12/22/2016   Procedure: ESOPHAGOGASTRODUODENOSCOPY (EGD) WITH PROPOFOL;  Surgeon: Lin Landsman, MD;  Location: [place];  Service: Endoscopy;  Laterality: N/A;  . rectal fistula surgery    . WRIST SURGERY       Current Outpatient Medications:  .  ARIPiprazole (ABILIFY) 5 MG tablet, Take 1 tablet (5 mg total) by mouth at bedtime., Disp: 30 tablet, Rfl: 2 .  budesonide (ENTOCORT EC) 3 MG 24 hr capsule, Take 9 mg by mouth daily. , Disp: , Rfl:  .  busPIRone (BUSPAR) 5 MG tablet, Take 1 tablet (5 mg total) by mouth 2 (two) times daily., Disp: 60 tablet, Rfl: 2 .  ergocalciferol (VITAMIN D2) 50000 units capsule, Take 1 capsule (50,000 Units total) by mouth 3 (three) times a week., Disp: 12 capsule, Rfl: 1 .  escitalopram (LEXAPRO) 10 MG tablet, Take 1 tablet (10 mg total) by mouth daily., Disp: 30 tablet, Rfl: 2 .  folic acid (FOLVITE) 1 MG tablet, Take 1 tablet (1 mg total) by mouth daily. Take 2 mg on the day of methotrexate, Disp: 90 tablet, Rfl: 2 .  hydrOXYzine (VISTARIL) 25 MG capsule, Take 1 capsule (25 mg total) by mouth daily as needed (severe anxiety attacks)., Disp: 30 capsule, Rfl: 2 .  inFLIXimab (REMICADE) 100 MG injection, Start 38m/kg IV on weeks 0,2,6 then continue every 8 weeks thereafter, Disp: 1 each, Rfl: 3 .  methotrexate (RHEUMATREX) 2.5 MG tablet, Take 10 tablets by mouth once weekly for 12 weeks Caution:Chemotherapy. Protect from light. (Patient taking differently: Take 10 tablets by mouth once weekly for 12 weeks (Mondays)), Disp: 120 tablet, Rfl: 0 .  vancomycin (VANCOCIN) 125 MG capsule, Take 1 capsule (125 mg total) by mouth 4 (four) times daily for 13 days., Disp: 52 capsule, Rfl: 0   Family History  Problem Relation Age of Onset  . Diabetes Paternal Grandfather   . Heart  disease Paternal Grandfather   . Depression Mother   . Drug abuse Mother   . Osteoporosis Neg Hx      Social History   Tobacco Use  . Smoking status: Never Smoker  . Smokeless tobacco: Former UNetwork engineerUse Topics  . Alcohol use: No    Frequency: Never  . Drug use: No    Comment: hx of 3 year ago     Allergies as of 05/03/2017  . (No Known Allergies)    Review of Systems:    All systems reviewed and negative except where noted in HPI.   Physical Exam:  BP 106/71   Pulse 81   Temp 98.4 F (36.9 C) (Oral)   Ht 5' 5"  (1.651 m)   Wt 133 lb (60.3 kg)   BMI 22.13 kg/m  No LMP for male patient. Filed Weights   05/03/17 1117  Weight: 133 lb (60.3 kg)   General:   Alert,  Thin bulit, pleasant and cooperative in NAD Head:  Normocephalic and atraumatic. Eyes:  Sclera clear, no icterus.   Conjunctiva pale. Ears:  Normal auditory acuity. Nose:  No deformity, discharge, or lesions. Mouth:  No deformity or lesions,oropharynx pink & moist. Neck:  Supple; no masses or thyromegaly. Lungs:  Respirations even and unlabored.  Clear throughout to auscultation.   No wheezes, crackles, or rhonchi. No acute distress. Heart:  Regular rate and rhythm; no  murmurs, clicks, rubs, or gallops. Abdomen:  Normal bowel sounds.  No bruits.  Soft, non-tender and non-distended without masses, hepatosplenomegaly or hernias noted.  No guarding or rebound tenderness.  Midline vertical scar from prior surgery Rectal: Not performed Msk:  Symmetrical without gross deformities. Good, equal movement & strength bilaterally. Pulses:  Normal pulses noted. Extremities:  No clubbing or edema.  No cyanosis. Neurologic:  Alert and oriented x3;  grossly normal neurologically. Skin:  Intact without significant lesions or rashes. No jaundice. Psych:  Alert and cooperative. Normal mood and affect.   Assessment and Plan:   JULION GATT is a 32 y.o. caucasian male with Ileocolonic Crohn's disease diagnosed  in 2007, stricturing and penetrating with perianal fistula, previously on Humira and Imuran, recurrence of postoperative Crohn's, resulted in discontinuation of Humira. Switched to [place] in 05/2016 biweekly and continued Imuran 150 mg daily which resulted in clinical remission followed by recurrence of luminal disease and perianal fistula that responded to antibiotics, then recurrence of symptoms after tapering abx, responded to entocort, EGD and colonoscopy 12/2016 revealed gastric crohn's and post op recurrence of Crohn's Rutgeert's i4, anastomotic stricture and status post dilation. Switched to Remicade and methotrexate. Currently in clinical remission, recent episode of C. Difficile being treated with oral vancomycin.  Crohn's disease: 1. Continue Remicade, finished induction, currently on maintenance at 8 weeks interval. Eduacted patient about monitoring of symptoms in between cycles to adjust the dose or check Remicade antibodies and levels 2. continue methotrexate 79m po weekly, follow cbc and LFTs every 347month3. Continue folic acid 31m49maily and 2mg36m day of methotrexate 4. Discontinue Entocort 5. Recommend colonoscopy to assess response to Remicade in next 3 months   IBD Health Maintenance  1.TB status: PPD skin test negative as of October, 2017, recheck during next visit 2. Anemia: has iron deficiency anemia secondary to active Crohn's disease. Received parenteral iron infusions, 2, vitamin B12 is 397. Last ferritin level 84 in 02/2017 3.Immunizations: Hep A and B not immune, recommend Twinrix vaccine by his PCP. Influenza-receives annual influenza vaccine, last received in October/2017, he is due for prevnar and pneumovax 4.Cancer screening I) Colon cancer/dysplasia surveillance: Last colonoscopy in 12/2016, no dysplasia II) Cervical cancer: N/A III) Skin cancer - counseled about annual skin exam by dermatology and skin protection in summer using sun screen SPF > 50, clothing 5.Bone  health: Has osteoporosis, being followed by endocrine Vitamin D status: Low vitamin D levels, completed vit D 50K. Recommended him to start vitamin D 500 units daily  Bone density testing: DEXA scan 09/29/2016 revealed osteoporosis, Z-score -3.3 5. Labs: every 3mon33monthSmoking: Never smoker 7. NSAIDs and Antibiotics use: No history of NSAID use  Follow up in 3 months or sooner     RohinCephas Darby

## 2017-05-16 ENCOUNTER — Ambulatory Visit: Payer: BC Managed Care – PPO | Admitting: Endocrinology

## 2017-05-17 ENCOUNTER — Ambulatory Visit (INDEPENDENT_AMBULATORY_CARE_PROVIDER_SITE_OTHER): Payer: BC Managed Care – PPO | Admitting: Endocrinology

## 2017-05-17 ENCOUNTER — Encounter: Payer: Self-pay | Admitting: Endocrinology

## 2017-05-17 VITALS — BP 110/80 | HR 89 | Temp 98.0°F | Wt 137.6 lb

## 2017-05-17 DIAGNOSIS — M810 Age-related osteoporosis without current pathological fracture: Secondary | ICD-10-CM | POA: Diagnosis not present

## 2017-05-17 LAB — VITAMIN D 25 HYDROXY (VIT D DEFICIENCY, FRACTURES): VITD: 46.7 ng/mL (ref 30.00–100.00)

## 2017-05-17 NOTE — Patient Instructions (Addendum)
blood tests are requested for you today.  We'll let you know about the results. Due to the Crohn's Disease, you may need to take the prescription strength on an ongoing basis.   Please come back for a follow-up appointment in 4 months.

## 2017-05-17 NOTE — Progress Notes (Signed)
Subjective:    Patient ID: Leroy Hernandez, male    DOB: 08/22/1985, 32 y.o.   MRN: [id number]  HPI Pt returns for f/u of osteoporosis: Dx'ed: 2018 Secondary causes: vit-D deficiency and intermitt steroids Fractures: right wrist (2000), right little finger (1995), and left hand (2018).   Past rx: none Current rx: ergocalciferol.  Last DEXA result: (2018): Femur Neck Right Z-score of -3.3.  Other: plan is to first normalize vit-D. Interval hx: He took ergocalciferol as rx'ed.  No further fractures.  pt states he feels well in general.  Past Medical History:  Diagnosis Date  . Anxiety   . Asthma   . Crohn's disease ([place])   . Depression   . Rectal fistula     Past Surgical History:  Procedure Laterality Date  . COLON RESECTION    . COLON SURGERY    . COLONOSCOPY WITH PROPOFOL N/A 04/03/2015   Procedure: COLONOSCOPY WITH PROPOFOL;  Surgeon: Hulen Luster, MD;  [facility];  Service: Endoscopy;  Laterality: N/A;  . COLONOSCOPY WITH PROPOFOL N/A 12/22/2015   Procedure: COLONOSCOPY WITH PROPOFOL;  Surgeon: [name], MD;  Location: [place];  Service: Endoscopy;  Laterality: N/A;  . COLONOSCOPY WITH PROPOFOL N/A 12/22/2016   Procedure: COLONOSCOPY WITH PROPOFOL;  Surgeon: Lin Landsman, MD;  Location: [place];  Service: Endoscopy;  Laterality: N/A;  . ESOPHAGOGASTRODUODENOSCOPY (EGD) WITH PROPOFOL N/A 12/22/2016   Procedure: ESOPHAGOGASTRODUODENOSCOPY (EGD) WITH PROPOFOL;  Surgeon: Lin Landsman, MD;  Location: [place];  Service: Endoscopy;  Laterality: N/A;  . rectal fistula surgery    . WRIST SURGERY      Social History   Socioeconomic History  . Marital status: Legally Separated    Spouse name: Not on file  . Number of children: 0  . Years of education: Not on file  . Highest education level: Associate degree: occupational, Hotel manager, or vocational program  Occupational History    Employer: [employer]    Social Needs  . Financial resource strain: Not hard at all  . Food insecurity:    Worry: Never true    Inability: Never true  . Transportation needs:    Medical: No    Non-medical: No  Tobacco Use  . Smoking status: Never Smoker  . Smokeless tobacco: Former Network engineer and Sexual Activity  . Alcohol use: No    Frequency: Never  . Drug use: No    Comment: hx of 3 year ago   . Sexual activity: Yes  Lifestyle  . Physical activity:    Days per week: 0 days    Minutes per session: 0 min  . Stress: Not at all  Relationships  . Social connections:    Talks on phone: More than three times a week    Gets together: Once a week    Attends religious service: Never    Active member of club or organization: No    Attends meetings of clubs or organizations: Never    Relationship status: Separated  . Intimate partner violence:    Fear of current or ex partner: No    Emotionally abused: No    Physically abused: No    Forced sexual activity: No  Other Topics Concern  . Not on file  Social History Narrative  . Not on file    Current Outpatient Medications on File Prior to Visit  Medication Sig Dispense Refill  . ARIPiprazole (ABILIFY) 5 MG tablet Take 1  tablet (5 mg total) by mouth at bedtime. 30 tablet 2  . budesonide (ENTOCORT EC) 3 MG 24 hr capsule Take 9 mg by mouth daily.     . busPIRone (BUSPAR) 5 MG tablet Take 1 tablet (5 mg total) by mouth 2 (two) times daily. 60 tablet 2  . escitalopram (LEXAPRO) 10 MG tablet Take 1 tablet (10 mg total) by mouth daily. 30 tablet 2  . folic acid (FOLVITE) 1 MG tablet Take 1 tablet (1 mg total) by mouth daily. Take 2 mg on the day of methotrexate 90 tablet 2  . hydrOXYzine (VISTARIL) 25 MG capsule Take 1 capsule (25 mg total) by mouth daily as needed (severe anxiety attacks). 30 capsule 2  . inFLIXimab (REMICADE) 100 MG injection Start 20m/kg IV on weeks 0,2,6 then continue every 8 weeks thereafter 1 each 3  . methotrexate (RHEUMATREX)  2.5 MG tablet Take 10 tablets by mouth once weekly for 12 weeks Caution:Chemotherapy. Protect from light. (Patient taking differently: Take 10 tablets by mouth once weekly for 12 weeks (Mondays)) 120 tablet 0   No current facility-administered medications on file prior to visit.     No Known Allergies  Family History  Problem Relation Age of Onset  . Diabetes Paternal Grandfather   . Heart disease Paternal Grandfather   . Depression Mother   . Drug abuse Mother   . Osteoporosis Neg Hx     BP 110/80 (BP Location: Right Arm, Patient Position: Sitting, Cuff Size: Normal)   Pulse 89   Temp 98 F (36.7 C) (Oral)   Wt 137 lb 9.6 oz (62.4 kg)   SpO2 99%   BMI 22.90 kg/m    Review of Systems Denies leg cramps    Objective:   Physical Exam VITAL SIGNS:  See vs page GENERAL: no distress Chest wall: no kyphosis      Assessment & Plan:  Vit-D deficiency: due for recheck Crohn's Dz: he may need ongoing high-dosage vit-D Osteoporosis: due for recheck later this year.  If it persists with vit-d repletion, we'll rx separately  Patient Instructions  blood tests are requested for you today.  We'll let you know about the results. Due to the Crohn's Disease, you may need to take the prescription strength on an ongoing basis.   Please come back for a follow-up appointment in 4 months.

## 2017-05-18 LAB — PTH, INTACT AND CALCIUM
CALCIUM: 9.3 mg/dL (ref 8.6–10.3)
PTH: 26 pg/mL (ref 14–64)

## 2017-05-23 ENCOUNTER — Other Ambulatory Visit: Payer: Self-pay | Admitting: Endocrinology

## 2017-06-13 ENCOUNTER — Encounter: Payer: Self-pay | Admitting: Psychiatry

## 2017-06-13 ENCOUNTER — Ambulatory Visit (INDEPENDENT_AMBULATORY_CARE_PROVIDER_SITE_OTHER): Payer: BC Managed Care – PPO | Admitting: Licensed Clinical Social Worker

## 2017-06-13 ENCOUNTER — Other Ambulatory Visit: Payer: Self-pay

## 2017-06-13 ENCOUNTER — Ambulatory Visit (INDEPENDENT_AMBULATORY_CARE_PROVIDER_SITE_OTHER): Payer: BC Managed Care – PPO | Admitting: Psychiatry

## 2017-06-13 VITALS — BP 114/77 | HR 79 | Temp 97.9°F | Wt 141.8 lb

## 2017-06-13 DIAGNOSIS — F411 Generalized anxiety disorder: Secondary | ICD-10-CM | POA: Diagnosis not present

## 2017-06-13 DIAGNOSIS — F331 Major depressive disorder, recurrent, moderate: Secondary | ICD-10-CM | POA: Diagnosis not present

## 2017-06-13 DIAGNOSIS — G2402 Drug induced acute dystonia: Secondary | ICD-10-CM | POA: Diagnosis not present

## 2017-06-13 MED ORDER — BENZTROPINE MESYLATE 0.5 MG PO TABS: 0.5000 mg | ORAL_TABLET | Freq: Every day | ORAL | 2 refills | Status: DC

## 2017-06-13 NOTE — Progress Notes (Signed)
[facility] [facility] MD OP Progress Note  06/13/2017 3:02 PM Leroy Hernandez  MRN:  [id number]  Chief Complaint: ' I am here for follow up.' Chief Complaint    Follow-up; Medication Refill     HPI: Leroy Hernandez is a 32 year old Caucasian male, employed, lives in [place], separated, has a history of anxiety and depression, as well as Crohn's disease, presented to the clinic today for a follow-up visit.  Patient today reports that he has been having some side effects to the Abilify.  He reports he has noticed some dystonic reaction of bilateral hands on and off.  He reports it does not bother him too much.  He reports that the Abilify otherwise is helping with his irritability and anger issues.  He reports his relationship with his girlfriend is improved.  He continues to stay compliant with his Lexapro and BuSpar.  He denies any side effects.  He reports work is going well.  Works continues to be stressful.  He continues to work with his therapist on the same.  He continues to use alcohol occasionally.  He denies abusing any illicit drugs.    Visit Diagnosis:    ICD-10-CM   1. MDD (major depressive disorder), recurrent episode, moderate (HCC) F33.1   2. GAD (generalized anxiety disorder) F41.1   3. 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  Trials of Ativan as needed.  Past Medical History:  Past Medical History:  Diagnosis Date  . Anxiety   . Asthma   . Crohn's disease ([place])   . Depression   . Rectal fistula     Past Surgical History:  Procedure Laterality Date  . COLON RESECTION    . COLON SURGERY    . COLONOSCOPY WITH PROPOFOL N/A 04/03/2015   Procedure: COLONOSCOPY WITH PROPOFOL;  Surgeon: Hulen Luster, MD;  Location[facility] END[facility]COPY;  Service: Endoscopy;  Laterality: N/A;  . COLONOSCOPY WITH PROPOFOL N/A 12/22/2015   Procedure: COLONOSCOPY WITH PROPOFOL;  Surgeon: [name], MD;  Location: [place];  Service: Endoscopy;  Laterality: N/A;  . COLONOSCOPY WITH PROPOFOL N/A 12/22/2016   Procedure: COLONOSCOPY WITH PROPOFOL;  Surgeon: Lin Landsman, MD;  Location: [place];  Service: Endoscopy;  Laterality: N/A;  . ESOPHAGOGASTRODUODENOSCOPY (EGD) WITH PROPOFOL N/A 12/22/2016   Procedure: ESOPHAGOGASTRODUODENOSCOPY (EGD) WITH PROPOFOL;  Surgeon: Lin Landsman, MD;  Location: [place];  Service: Endoscopy;  Laterality: N/A;  . rectal fistula surgery    . WRIST SURGERY      Family Psychiatric History: I have reviewed family psychiatric history from my progress note on 03/22/2017.  Family History:  Family History  Problem Relation Age of Onset  . Diabetes Paternal Grandfather   . Heart disease Paternal Grandfather   . Depression Mother   . Drug abuse Mother   . Osteoporosis Neg Hx    Substance abuse history: Denies  Social History: Leroy Hernandez is separated from his wife, currently in the process of getting a divorce.  He lives with his girlfriend.  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  I have reviewed social history from my progress note on 03/22/2017. Social History   Socioeconomic History  . Marital status: Legally Separated    Spouse name: Not on file  . Number of children: 0  . Years of education: Not on file  . Highest education level: Associate degree: occupational, Hotel manager, or vocational program  Occupational History    Employer: [employer]  Social Needs  . Financial resource strain: Not hard at all  . Food insecurity:    Worry: Never true    Inability: Never true  . Transportation needs:    Medical: No    Non-medical: No  Tobacco Use  . Smoking status: Never Smoker  . Smokeless tobacco: Former Network engineer and Sexual Activity  . Alcohol use: No    Frequency: Never  . Drug use: No    Comment: hx of 3 year ago   . Sexual activity: Yes  Lifestyle  . Physical activity:    Days per week: 0  days    Minutes per session: 0 min  . Stress: Not at all  Relationships  . Social connections:    Talks on phone: More than three times a week    Gets together: Once a week    Attends religious service: Never    Active member of club or organization: No    Attends meetings of clubs or organizations: Never    Relationship status: Separated  Other Topics Concern  . Not on file  Social History Narrative  . Not on file    Allergies: No Known Allergies  Metabolic Disorder Labs: Lab Results  Component Value Date   HGBA1C 5.0 03/22/2017   MPG 96.8 03/22/2017   Lab Results  Component Value Date   PROLACTIN 9.4 03/22/2017   Lab Results  Component Value Date   CHOL 130 03/22/2017   TRIG 72 03/22/2017   HDL 47 03/22/2017   CHOLHDL 2.8 03/22/2017   VLDL 14 03/22/2017   LDLCALC 69 03/22/2017   Lab Results  Component Value Date   TSH 1.257 03/22/2017   TSH 1.80 12/09/2016    Therapeutic Level Labs: No results found for: LITHIUM No results found for: VALPROATE No components found for:  CBMZ  Current Medications: Current Outpatient Medications  Medication Sig Dispense Refill  . ARIPiprazole (ABILIFY) 5 MG tablet Take 1 tablet (5 mg total) by mouth at bedtime. 30 tablet 2  . budesonide (ENTOCORT EC) 3 MG 24 hr capsule Take 9 mg by mouth daily.     . busPIRone (BUSPAR) 5 MG tablet Take 1 tablet (5 mg total) by mouth 2 (two) times daily. 60 tablet 2  . escitalopram (LEXAPRO) 10 MG tablet Take 1 tablet (10 mg total) by mouth daily. 30 tablet 2  . folic acid (FOLVITE) 1 MG tablet Take 1 tablet (1 mg total) by mouth daily. Take 2 mg on the day of methotrexate 90 tablet 2  . hydrOXYzine (VISTARIL) 25 MG capsule Take 1 capsule (25 mg total) by mouth daily as needed (severe anxiety attacks). 30 capsule 2  . inFLIXimab (REMICADE) 100 MG injection Start 48m/kg IV on weeks 0,2,6 then continue every 8 weeks thereafter 1 each 3  . methotrexate (RHEUMATREX) 2.5 MG tablet Take 10 tablets  by mouth once weekly for 12 weeks Caution:Chemotherapy. Protect from light. (Patient taking differently: Take 10 tablets by mouth once weekly for 12 weeks (Mondays)) 120 tablet 0  . benztropine (COGENTIN) 0.5 MG tablet Take 1 tablet (0.5 mg total) by mouth at bedtime. 30 tablet 2   No current facility-administered medications for this visit.      Musculoskeletal: Strength & Muscle Tone: within normal limits Gait & Station: normal Patient leans: N/A  Psychiatric Specialty Exam: Review of Systems  All other systems reviewed and are negative.   Blood pressure 114/77, pulse  79, temperature 97.9 F (36.6 C), temperature source Oral, weight 141 lb 12.8 oz (64.3 kg).Body mass index is 23.6 kg/m.  General Appearance: Casual  Eye Contact:  Fair  Speech:  Clear and Coherent  Volume:  Normal  Mood:  Anxious  Affect:  Congruent  Thought Process:  Goal Directed and Descriptions of Associations: Intact  Orientation:  Full (Time, Place, and Person)  Thought Content: Logical   Suicidal Thoughts:  No  Homicidal Thoughts:  No  Memory:  Immediate;   Fair Recent;   Fair Remote;   Fair  Judgement:  Fair  Insight:  Fair  Psychomotor Activity:  Normal  Concentration:  Concentration: Fair and Attention Span: Fair  Recall:  [employer] of Knowledge: Fair  Language: Fair  Akathisia:  No  Handed:  Right  AIMS (if indicated): 3  Assets:  Communication Skills Desire for Improvement Housing Social Support  ADL's:  Intact  Cognition: WNL  Sleep:  Fair   Screenings: AIMS     Office Visit from 06/13/2017 in [place] Total Score  3    AIMS   Assessment and Plan: Leroy Hernandez is a 32 year old Caucasian male, separated, employed, lives in [place], has a history of anxiety, depression, intermittent rages, Crohn's disease, presented to the clinic today for a follow-up visit.  Patient reports he has some side effects to the Abilify, dystonic reactions on and off.  He  reports he is not distressed by it.  He reports his mood symptoms is improving.  He will continue psychotherapy with Ms. Peacock.  Plan as noted below.  Plan Continue Lexapro 10 mg p.o. daily-initiated by PMD Continue BuSpar 5 mg p.o. twice daily-initiated by PMD Abilify 5 mg p.o. nightly for mood symptoms   For anxiety disorder Continue Lexapro 10 mg p.o. daily Continue BuSpar 5 mg p.o. twice daily  For abnormal involuntary movements Start Cogentin 0.5 mg p.o. Nightly AIMS = 3  Continue CBT with Ms. Peacock.  Follow up in clinic in 1 month or sooner if needed.  More than 50 % of the time was spent for psychoeducation and supportive psychotherapy and care coordination.  This note was generated in part or whole with voice recognition software. Voice recognition is usually quite accurate but there are transcription errors that can and very often do occur. I apologize for any typographical errors that were not detected and corrected.         Ursula Alert, MD 06/13/2017, 3:02 PM

## 2017-06-13 NOTE — Patient Instructions (Signed)
Cogentin Benztropine tablets What is this medicine? BENZTROPINE (BENZ troe peen) is for certain movement problems due to Parkinson's disease, certain medicines, or other causes. This medicine may be used for other purposes; ask your health care provider or pharmacist if you have questions. COMMON BRAND NAME(S): Cogentin What should I tell my health care provider before I take this medicine? They need to know if you have any of these conditions: -glaucoma -heart disease or a rapid heartbeat -mental problems -prostate trouble -tardive dyskinesia -an unusual or allergic reaction to benztropine, other medicines, lactose, foods, dyes, or preservatives -pregnant or trying to get pregnant -breast-feeding How should I use this medicine? Take this medicine by mouth with a full glass of water. Follow the directions on the prescription label. Take your medicine at regular intervals. Do not take your medicine more often than directed. Talk to your pediatrician regarding the use of this medicine in children. While this drug may be prescribed for children as young as 3 years for selected conditions, precautions do apply. Overdosage: If you think you have taken too much of this medicine contact a poison control center or emergency room at once. NOTE: This medicine is only for you. Do not share this medicine with others. What if I miss a dose? If you miss a dose, take it as soon as you can. If it is almost time for your next dose, take only that dose. Do not take double or extra doses. What may interact with this medicine? -haloperidol -medicines for movement abnormalities like Parkinson's disease -phenothiazines like chlorpromazine, mesoridazine, prochlorperazine, thioridazine -some antidepressants like amitriptyline, desipramine, doxepin, nortriptyline -stimulant medicines for attention, weight loss, and to stay awake -tegaserod This list may not describe all possible interactions. Give your health  care provider a list of all the medicines, herbs, non-prescription drugs, or dietary supplements you use. Also tell them if you smoke, drink alcohol, or use illegal drugs. Some items may interact with your medicine. What should I watch for while using this medicine? Visit your doctor or health care professional for regular checks on your progress. You may get drowsy or dizzy. Do not drive, use machinery, or do anything that needs mental alertness until you know how this medicine affects you. Do not stand or sit up quickly, especially if you are an older patient. This reduces the risk of dizzy or fainting spells. Alcohol may interfere with the effect of this medicine. Avoid alcoholic drinks. Your mouth may get dry. Chewing sugarless gum or sucking hard candy, and drinking plenty of water may help. Contact your doctor if the problem does not go away or is severe. This medicine may cause dry eyes and blurred vision. If you wear contact lenses you may feel some discomfort. Lubricating drops may help. See your eye doctor if the problem does not go away or is severe. You may sweat less than usual while you are taking this medicine. As a result your body temperature could rise to a dangerous level. Be careful not to get overheated during exercise or in hot weather. You could get heat stroke. Avoid taking hot baths and using hot tubs and saunas. What side effects may I notice from receiving this medicine? Side effects that you should report to your doctor or health care professional as soon as possible: -allergic reactions like skin rash, itching or hives, swelling of the face, lips, or tongue -changes in vision -confusion -decreased sweating or heat intolerance -depression -fast, irregular heartbeat -hallucinations -memory loss -muscle weakness -pain or  difficulty passing urine -vomiting Side effects that usually do not require medical attention (report to your doctor or health care professional if they  continue or are bothersome): -constipation -dry mouth -nausea This list may not describe all possible side effects. Call your doctor for medical advice about side effects. You may report side effects to FDA at 1-800-FDA-1088. Where should I keep my medicine? Keep out of the reach of children. Store below 30 degrees C (86 degrees F). Keep container tightly closed. Throw away any unused medicine after the expiration date. NOTE: This sheet is a summary. It may not cover all possible information. If you have questions about this medicine, talk to your doctor, pharmacist, or health care provider.  2018 Elsevier/Gold Standard (2007-04-26 15:38:20)

## 2017-06-14 ENCOUNTER — Other Ambulatory Visit: Payer: Self-pay | Admitting: Gastroenterology

## 2017-06-14 ENCOUNTER — Other Ambulatory Visit: Payer: Self-pay

## 2017-06-14 DIAGNOSIS — K50012 Crohn's disease of small intestine with intestinal obstruction: Secondary | ICD-10-CM

## 2017-06-14 MED ORDER — FOLIC ACID 1 MG PO TABS: ORAL_TABLET | ORAL | 0 refills | Status: DC

## 2017-06-14 NOTE — Telephone Encounter (Signed)
Please ok this prescription

## 2017-06-15 NOTE — Progress Notes (Signed)
   THERAPIST PROGRESS NOTE  Session Time: 47mn  Participation Level: Active  Behavioral Response: CasualAlertEuthymic  Type of Therapy: Individual Therapy  Treatment Goals addressed: Anxiety and Diagnosis: Depression  Interventions: Supportive  Summary: Leroy DANIELSKIis a 32y.o. male who presents with continued symptoms of his diagnosis.  Patient reports that he feels better since he began taking medication.  Patient reports that he has only displayed one anger outburst in the past month.  He reports being able to handle stress slightly better.  Discussion of his thoughts and feelings toward his wife for leaving.  LCSW discussed what psychotherapy is and is not and the importance of the therapeutic relationship to include open and honest communication between client and therapist and building trust.  Reviewed advantages and disadvantages of the therapeutic process and limitations to the therapeutic relationship including LCSW's role in maintaining the safety of the client, others and those in client's care.    Suicidal/Homicidal: No   Plan: Return again in2 weeks.  Diagnosis: Axis I: Depression    Axis II: No diagnosis    NLubertha South LCSW 06/13/2017

## 2017-07-14 ENCOUNTER — Ambulatory Visit: Payer: BC Managed Care – PPO | Admitting: Psychiatry

## 2017-07-15 ENCOUNTER — Other Ambulatory Visit: Payer: Self-pay

## 2017-07-15 ENCOUNTER — Ambulatory Visit (INDEPENDENT_AMBULATORY_CARE_PROVIDER_SITE_OTHER): Payer: BC Managed Care – PPO | Admitting: Psychiatry

## 2017-07-15 ENCOUNTER — Encounter: Payer: Self-pay | Admitting: Psychiatry

## 2017-07-15 VITALS — BP 117/76 | HR 87 | Temp 98.1°F | Wt 138.8 lb

## 2017-07-15 DIAGNOSIS — G2402 Drug induced acute dystonia: Secondary | ICD-10-CM

## 2017-07-15 DIAGNOSIS — F331 Major depressive disorder, recurrent, moderate: Secondary | ICD-10-CM

## 2017-07-15 DIAGNOSIS — F411 Generalized anxiety disorder: Secondary | ICD-10-CM

## 2017-07-15 MED ORDER — BUSPIRONE HCL 5 MG PO TABS: 5.0000 mg | ORAL_TABLET | Freq: Two times a day (BID) | ORAL | 1 refills | Status: DC

## 2017-07-15 MED ORDER — ESCITALOPRAM OXALATE 10 MG PO TABS: 10.0000 mg | ORAL_TABLET | Freq: Every day | ORAL | 1 refills | Status: DC

## 2017-07-15 MED ORDER — ARIPIPRAZOLE 5 MG PO TABS: 5.0000 mg | ORAL_TABLET | Freq: Every day | ORAL | 1 refills | Status: DC

## 2017-07-15 MED ORDER — HYDROXYZINE PAMOATE 25 MG PO CAPS: 25.0000 mg | ORAL_CAPSULE | Freq: Every day | ORAL | 1 refills | Status: AC | PRN

## 2017-07-15 MED ORDER — BENZTROPINE MESYLATE 0.5 MG PO TABS: 0.5000 mg | ORAL_TABLET | Freq: Every day | ORAL | 1 refills | Status: DC

## 2017-07-15 NOTE — Progress Notes (Signed)
[place] [facility] MD OP Progress Note  07/15/2017 1:09 PM Leroy Hernandez  MRN:  [id number]  Chief Complaint: ' I am here for follow up." Chief Complaint    Follow-up; Medication Refill     HPI: Leroy Hernandez is a 32 year old Caucasian male, employed, lives in [place], separated, has a history of anxiety, depression, Crohn's disease, presented to the clinic today for a follow-up visit.  Patient today reports he is currently doing well on the current medication regimen.  He does have some on and off irritability however he reports it is much better than before.  He reports that at times when he has to take hydroxyzine as needed which helps.  Patient reports he is compliant on the other medications like Lexapro, BuSpar and Abilify.  He did have some abnormal movement problems from the Abilify however Cogentin has helped him with the same.  He does not have it anymore.  He reports work is going well.  He did continues to work with his therapist.  He reports his girlfriend may move to [place] since she is working on her Scientist, product/process development and disability.  Patient reports he does not know if that will happen or not but he is kind of worried about that.  He however reports he will continue to work with his therapist.  He denies any suicidality.  He denies any perceptual disturbances.  He reports he uses CBD to help with his Crohn's disease.  He denies abusing any other drugs or alcohol.   Visit Diagnosis:    ICD-10-CM   1. MDD (major depressive disorder), recurrent episode, moderate (HCC) F33.1 ARIPiprazole (ABILIFY) 5 MG tablet    busPIRone (BUSPAR) 5 MG tablet    escitalopram (LEXAPRO) 10 MG tablet  2. GAD (generalized anxiety disorder) F41.1 escitalopram (LEXAPRO) 10 MG tablet    hydrOXYzine (VISTARIL) 25 MG capsule  3. 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  Trials of Ativan in the past.  Past Medical History:  Past  Medical History:  Diagnosis Date  . Anxiety   . Asthma   . Crohn's disease ([place])   . Depression   . Rectal fistula     Past Surgical History:  Procedure Laterality Date  . COLON RESECTION    . COLON SURGERY    . COLONOSCOPY WITH PROPOFOL N/A 04/03/2015   Procedure: COLONOSCOPY WITH PROPOFOL;  Surgeon: Hulen Luster, MD;  Location[facility] ENDOSCOPY;  Service: Endoscopy;  Laterality: N/A;  . COLONOSCOPY WITH PROPOFOL N/A 12/22/2015   Procedure: COLONOSCOPY WITH PROPOFOL;  Surgeon: [name], MD;  Location: [place];  Service: Endoscopy;  Laterality: N/A;  . COLONOSCOPY WITH PROPOFOL N/A 12/22/2016   Procedure: COLONOSCOPY WITH PROPOFOL;  Surgeon: Lin Landsman, MD;  Location: [place];  Service: Endoscopy;  Laterality: N/A;  . ESOPHAGOGASTRODUODENOSCOPY (EGD) WITH PROPOFOL N/A 12/22/2016   Procedure: ESOPHAGOGASTRODUODENOSCOPY (EGD) WITH PROPOFOL;  Surgeon: Lin Landsman, MD;  Location: [place];  Service: Endoscopy;  Laterality: N/A;  . rectal fistula surgery    . WRIST SURGERY      Family Psychiatric History: Reviewed family psychiatric history from my progress note on 03/22/2017  Family History:  Family History  Problem Relation Age of Onset  . Diabetes Paternal Grandfather   . Heart disease Paternal Grandfather   . Depression Mother   . Drug abuse Mother   . Osteoporosis Neg Hx    Substance abuse history: Uses  CBD  Social History: Leroy Hernandez lives with his girlfriend.  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  I have reviewed social history from my progress note on 03/22/2017. Social History   Socioeconomic History  . Marital status: Legally Separated    Spouse name: Not on file  . Number of children: 0  . Years of education: Not on file  . Highest education level: Associate degree: occupational, Hotel manager, or vocational program  Occupational History    Employer: [employer]  Social Needs  . Financial  resource strain: Not hard at all  . Food insecurity:    Worry: Never true    Inability: Never true  . Transportation needs:    Medical: No    Non-medical: No  Tobacco Use  . Smoking status: Never Smoker  . Smokeless tobacco: Former Network engineer and Sexual Activity  . Alcohol use: No    Frequency: Never  . Drug use: No    Comment: hx of 3 year ago   . Sexual activity: Yes  Lifestyle  . Physical activity:    Days per week: 0 days    Minutes per session: 0 min  . Stress: Not at all  Relationships  . Social connections:    Talks on phone: More than three times a week    Gets together: Once a week    Attends religious service: Never    Active member of club or organization: No    Attends meetings of clubs or organizations: Never    Relationship status: Separated  Other Topics Concern  . Not on file  Social History Narrative  . Not on file    Allergies: No Known Allergies  Metabolic Disorder Labs: Lab Results  Component Value Date   HGBA1C 5.0 03/22/2017   MPG 96.8 03/22/2017   Lab Results  Component Value Date   PROLACTIN 9.4 03/22/2017   Lab Results  Component Value Date   CHOL 130 03/22/2017   TRIG 72 03/22/2017   HDL 47 03/22/2017   CHOLHDL 2.8 03/22/2017   VLDL 14 03/22/2017   LDLCALC 69 03/22/2017   Lab Results  Component Value Date   TSH 1.257 03/22/2017   TSH 1.80 12/09/2016    Therapeutic Level Labs: No results found for: LITHIUM No results found for: VALPROATE No components found for:  CBMZ  Current Medications: Current Outpatient Medications  Medication Sig Dispense Refill  . ARIPiprazole (ABILIFY) 5 MG tablet Take 1 tablet (5 mg total) by mouth at bedtime. 90 tablet 1  . benztropine (COGENTIN) 0.5 MG tablet Take 1 tablet (0.5 mg total) by mouth at bedtime. 90 tablet 1  . budesonide (ENTOCORT EC) 3 MG 24 hr capsule Take 9 mg by mouth daily.     . busPIRone (BUSPAR) 5 MG tablet Take 1 tablet (5 mg total) by mouth 2 (two) times daily. 180  tablet 1  . escitalopram (LEXAPRO) 10 MG tablet Take 1 tablet (10 mg total) by mouth daily. 90 tablet 1  . folic acid (FOLVITE) 1 MG tablet TAKE 1 TABLET BY MOUTH EVERY DAY. TAKE 2 TABLETS ON DAY WHEN YOU TAKE METHOTREXATE 90 tablet 0  . hydrOXYzine (VISTARIL) 25 MG capsule Take 1 capsule (25 mg total) by mouth daily as needed (severe anxiety attacks). 90 capsule 1  . inFLIXimab (REMICADE) 100 MG injection Start 59m/kg IV on weeks 0,2,6 then continue every 8 weeks thereafter 1 each 3  . methotrexate (RHEUMATREX) 2.5 MG tablet Take 10 tablets  by mouth once weekly for 12 weeks Caution:Chemotherapy. Protect from light. (Patient taking differently: Take 10 tablets by mouth once weekly for 12 weeks (Mondays)) 120 tablet 0   No current facility-administered medications for this visit.      Musculoskeletal: Strength & Muscle Tone: within normal limits Gait & Station: normal Patient leans: N/A  Psychiatric Specialty Exam: Review of Systems  Psychiatric/Behavioral: The patient is nervous/anxious.   All other systems reviewed and are negative.   Blood pressure 117/76, pulse 87, temperature 98.1 F (36.7 C), temperature source Oral, weight 138 lb 12.8 oz (63 kg).Body mass index is 23.1 kg/m.  General Appearance: Casual  Eye Contact:  Fair  Speech:  Clear and Coherent  Volume:  Normal  Mood:  Anxious  Affect:  Congruent  Thought Process:  Goal Directed and Descriptions of Associations: Intact  Orientation:  Full (Time, Place, and Person)  Thought Content: Logical   Suicidal Thoughts:  No  Homicidal Thoughts:  No  Memory:  Immediate;   Fair Recent;   Fair Remote;   Fair  Judgement:  Fair  Insight:  Fair  Psychomotor Activity:  Normal  Concentration:  Concentration: Fair and Attention Span: Fair  Recall:  [employer] of Knowledge: Fair  Language: Fair  Akathisia:  No  Handed:  Right  AIMS (if indicated): 0  Assets:  Communication Skills Desire for Improvement Social Support   ADL's:  Intact  Cognition: WNL  Sleep:  Fair   Screenings: AIMS     Office Visit from 06/13/2017 in [place] Total Score  3       Assessment and Plan: Leroy Hernandez is a 32 year old Caucasian male, separated, employed, lives in [place], has a history of anxiety, depression, intermittent rages, Crohn's disease, presented to the clinic today for a follow-up visit.  He continues to have psychosocial stressors of his girlfriend possibly moving to [place] as well as work-related stress.  He continues to be in psychotherapy.  We will continue medications as noted below which are helping.  Plan as noted below.  Plan MDD Continue Lexapro 10 mg p.o. daily-initiated by PMD Continue BuSpar 5 mg p.o. twice daily-initiated by PMD Abilify 5 mg p.o. nightly for mood symptoms Continue Cogentin 0.5 mg p.o. nightly for EPS   Anxiety disorder Continue Lexapro and BuSpar as prescribed  For abnormal involuntary movements Cogentin 0.5 mg p.o. nightly [place] equals 0   Continue therapy with Ms. Peacock.  Follow-up in clinic in 2 months or sooner if needed.  More than 50 % of the time was spent for psychoeducation and supportive psychotherapy and care coordination.  This note was generated in part or whole with voice recognition software. Voice recognition is usually quite accurate but there are transcription errors that can and very often do occur. I apologize for any typographical errors that were not detected and corrected.       Ursula Alert, MD 07/15/2017, 1:09 PM

## 2017-07-18 ENCOUNTER — Other Ambulatory Visit: Payer: Self-pay

## 2017-07-18 ENCOUNTER — Encounter: Payer: Self-pay | Admitting: Emergency Medicine

## 2017-07-18 ENCOUNTER — Emergency Department
Admission: EM | Admit: 2017-07-18 | Discharge: 2017-07-19 | Disposition: A | Discharge status: Home / Self Care | Payer: BC Managed Care – PPO | Attending: Emergency Medicine | Admitting: Emergency Medicine

## 2017-07-18 DIAGNOSIS — Z79899 Other long term (current) drug therapy: Secondary | ICD-10-CM | POA: Diagnosis not present

## 2017-07-18 DIAGNOSIS — F419 Anxiety disorder, unspecified: Secondary | ICD-10-CM | POA: Diagnosis not present

## 2017-07-18 DIAGNOSIS — J45909 Unspecified asthma, uncomplicated: Secondary | ICD-10-CM | POA: Insufficient documentation

## 2017-07-18 DIAGNOSIS — R197 Diarrhea, unspecified: Secondary | ICD-10-CM | POA: Diagnosis not present

## 2017-07-18 DIAGNOSIS — F329 Major depressive disorder, single episode, unspecified: Secondary | ICD-10-CM | POA: Diagnosis not present

## 2017-07-18 DIAGNOSIS — R1084 Generalized abdominal pain: Secondary | ICD-10-CM | POA: Diagnosis present

## 2017-07-18 LAB — COMPREHENSIVE METABOLIC PANEL
ALT: 21 U/L (ref 17–63)
AST: 20 U/L (ref 15–41)
Albumin: 4.2 g/dL (ref 3.5–5.0)
Alkaline Phosphatase: 85 U/L (ref 38–126)
Anion gap: 9 (ref 5–15)
BILIRUBIN TOTAL: 1.4 mg/dL — AB (ref 0.3–1.2)
BUN: 10 mg/dL (ref 6–20)
CHLORIDE: 103 mmol/L (ref 101–111)
CO2: 26 mmol/L (ref 22–32)
Calcium: 9 mg/dL (ref 8.9–10.3)
Creatinine, Ser: 1.13 mg/dL (ref 0.61–1.24)
GFR calc Af Amer: >60 mL/min (ref >60)
GFR calc non Af Amer: >60 mL/min (ref >60)
GLUCOSE: 108 mg/dL — AB (ref 65–99)
POTASSIUM: 3.1 mmol/L — AB (ref 3.5–5.1)
SODIUM: 138 mmol/L (ref 135–145)
TOTAL PROTEIN: 7.5 g/dL (ref 6.5–8.1)

## 2017-07-18 LAB — URINALYSIS, COMPLETE (UACMP) WITH MICROSCOPIC
BACTERIA UA: NONE SEEN
Bilirubin Urine: NEGATIVE
Glucose, UA: NEGATIVE mg/dL
Hgb urine dipstick: NEGATIVE
Ketones, ur: 5 mg/dL — AB
Leukocytes, UA: NEGATIVE
Nitrite: NEGATIVE
PROTEIN: 30 mg/dL — AB
SPECIFIC GRAVITY, URINE: 1.028 (ref 1.005–1.030)
pH: 5 (ref 5.0–8.0)

## 2017-07-18 LAB — CBC
HEMATOCRIT: 46.7 % (ref 40.0–52.0)
Hemoglobin: 16.2 g/dL (ref 13.0–18.0)
MCH: 29.2 pg (ref 26.0–34.0)
MCHC: 34.7 g/dL (ref 32.0–36.0)
MCV: 84 fL (ref 80.0–100.0)
Platelets: 250 10*3/uL (ref 150–440)
RBC: 5.55 MIL/uL (ref 4.40–5.90)
RDW: 12.7 % (ref 11.5–14.5)
WBC: 7.1 10*3/uL (ref 3.8–10.6)

## 2017-07-18 LAB — C DIFFICILE QUICK SCREEN W PCR REFLEX
C DIFFICLE (CDIFF) ANTIGEN: NEGATIVE
C Diff interpretation: NOT DETECTED
C Diff toxin: NEGATIVE

## 2017-07-18 LAB — LIPASE, BLOOD: Lipase: 26 U/L (ref 11–51)

## 2017-07-18 MED ORDER — LOPERAMIDE HCL 2 MG PO CAPS
4.0000 mg | ORAL_CAPSULE | Freq: Once | ORAL | Status: AC
  Administered 2017-07-19: 4 mg via ORAL
  Filled 2017-07-18: qty 2

## 2017-07-18 MED ORDER — SODIUM CHLORIDE 0.9 % IV BOLUS
1000.0000 mL | Freq: Once | INTRAVENOUS | Status: AC
  Administered 2017-07-18: 1000 mL via INTRAVENOUS

## 2017-07-18 NOTE — Discharge Instructions (Signed)
It was a pleasure to take care of you today, and thank you for coming to our emergency department.  If you have any questions or concerns before leaving please ask the nurse to grab me and I'm more than happy to go through your aftercare instructions again.  If you were prescribed any opioid pain medication today such as Norco, Vicodin, Percocet, morphine, hydrocodone, or oxycodone please make sure you do not drive when you are taking this medication as it can alter your ability to drive safely.  If you have any concerns once you are home that you are not improving or are in fact getting worse before you can make it to your follow-up appointment, please do not hesitate to call 911 and come back for further evaluation.  Darel Hong, MD  Results for orders placed or performed during the hospital encounter of 07/18/17  C difficile quick scan w PCR reflex  Result Value Ref Range   C Diff antigen NEGATIVE NEGATIVE   C Diff toxin NEGATIVE NEGATIVE   C Diff interpretation No C. difficile detected.   Lipase, blood  Result Value Ref Range   Lipase 26 11 - 51 U/L  Comprehensive metabolic panel  Result Value Ref Range   Sodium 138 135 - 145 mmol/L   Potassium 3.1 (L) 3.5 - 5.1 mmol/L   Chloride 103 101 - 111 mmol/L   CO2 26 22 - 32 mmol/L   Glucose, Bld 108 (H) 65 - 99 mg/dL   BUN 10 6 - 20 mg/dL   Creatinine, Ser 1.13 0.61 - 1.24 mg/dL   Calcium 9.0 8.9 - 10.3 mg/dL   Total Protein 7.5 6.5 - 8.1 g/dL   Albumin 4.2 3.5 - 5.0 g/dL   AST 20 15 - 41 U/L   ALT 21 17 - 63 U/L   Alkaline Phosphatase 85 38 - 126 U/L   Total Bilirubin 1.4 (H) 0.3 - 1.2 mg/dL   GFR calc non Af Amer >60 >60 mL/min   GFR calc Af Amer >60 >60 mL/min   Anion gap 9 5 - 15  CBC  Result Value Ref Range   WBC 7.1 3.8 - 10.6 K/uL   RBC 5.55 4.40 - 5.90 MIL/uL   Hemoglobin 16.2 13.0 - 18.0 g/dL   HCT 46.7 40.0 - 52.0 %   MCV 84.0 80.0 - 100.0 fL   MCH 29.2 26.0 - 34.0 pg   MCHC 34.7 32.0 - 36.0 g/dL   RDW 12.7 11.5  - 14.5 %   Platelets 250 150 - 440 K/uL  Urinalysis, Complete w Microscopic  Result Value Ref Range   Color, Urine AMBER (A) YELLOW   APPearance CLEAR (A) CLEAR   Specific Gravity, Urine 1.028 1.005 - 1.030   pH 5.0 5.0 - 8.0   Glucose, UA NEGATIVE NEGATIVE mg/dL   Hgb urine dipstick NEGATIVE NEGATIVE   Bilirubin Urine NEGATIVE NEGATIVE   Ketones, ur 5 (A) NEGATIVE mg/dL   Protein, ur 30 (A) NEGATIVE mg/dL   Nitrite NEGATIVE NEGATIVE   Leukocytes, UA NEGATIVE NEGATIVE   RBC / HPF 0-5 0 - 5 RBC/hpf   WBC, UA 0-5 0 - 5 WBC/hpf   Bacteria, UA NONE SEEN NONE SEEN   Squamous Epithelial / LPF 0-5 0 - 5   Mucus PRESENT

## 2017-07-18 NOTE — ED Provider Notes (Signed)
[facility] Emergency Department Provider Note  ____________________________________________   First MD Initiated Contact with Patient 07/18/17 2125     (approximate)  I have reviewed the triage vital signs and the nursing notes.   HISTORY  Chief Complaint Diarrhea and Abdominal Pain   HPI Leroy Hernandez is a 32 y.o. male who self presents to the emergency department with generalized abdominal pain and loose watery stools for the past 3 days or so.  He has a complex past medical history including Crohn's disease and several months ago he had C. difficile which was treated by a course of oral vancomycin.  His symptoms feel similar to when he had C. difficile and is concerned.  He has had a denies stools a day.  His abdominal pain is moderate severity diffuse nonradiating cramping worse after eating improved after defecating.  No recent antibiotics.  No fevers or chills.  No nausea or vomiting.  Past Medical History:  Diagnosis Date  . Anxiety   . Asthma   . Crohn's disease ([place])   . Depression   . Rectal fistula     Patient Active Problem List   Diagnosis Date Noted  . H/O Clostridium difficile infection 05/03/2017  . Gastroenteritis due to norovirus 05/03/2017  . Iron deficiency anemia 12/28/2016  . Osteoporosis 12/09/2016  . Generalized abdominal pain   . Perirectal fistula   . Sepsis ([place]) 06/22/2016  . Fracture of metacarpal bone 06/09/2016  . Calculus of gallbladder without cholecystitis without obstruction 12/25/2015  . Gallbladder polyp 12/25/2015  . Blood in stool   . Intestinal bypass or anastomosis status   . Crohn's disease of both small and large intestine with rectal bleeding ([place])   . Perirectal abscess 07/04/2015  . Mild episode of recurrent major depressive disorder ([place]) 04/02/2015  . Asthma, mild intermittent 04/02/2015    Past Surgical History:  Procedure Laterality Date  . COLON RESECTION    . COLON SURGERY    .  COLONOSCOPY WITH PROPOFOL N/A 04/03/2015   Procedure: COLONOSCOPY WITH PROPOFOL;  Surgeon: Hulen Luster, MD;  Location: [facility] ENDOSCOPY;  Service: Endoscopy;  Laterality: N/A;  . COLONOSCOPY WITH PROPOFOL N/A 12/22/2015   Procedure: COLONOSCOPY WITH PROPOFOL;  Surgeon: [name], MD;  Location: [place];  Service: Endoscopy;  Laterality: N/A;  . COLONOSCOPY WITH PROPOFOL N/A 12/22/2016   Procedure: COLONOSCOPY WITH PROPOFOL;  Surgeon: Lin Landsman, MD;  Location: [place];  Service: Endoscopy;  Laterality: N/A;  . ESOPHAGOGASTRODUODENOSCOPY (EGD) WITH PROPOFOL N/A 12/22/2016   Procedure: ESOPHAGOGASTRODUODENOSCOPY (EGD) WITH PROPOFOL;  Surgeon: Lin Landsman, MD;  Location: [place];  Service: Endoscopy;  Laterality: N/A;  . rectal fistula surgery    . WRIST SURGERY      Prior to Admission medications   Medication Sig Start Date End Date Taking? Authorizing Provider  ARIPiprazole (ABILIFY) 5 MG tablet Take 1 tablet (5 mg total) by mouth at bedtime. 07/15/17   Ursula Alert, MD  benztropine (COGENTIN) 0.5 MG tablet Take 1 tablet (0.5 mg total) by mouth at bedtime. 07/15/17   Ursula Alert, MD  budesonide (ENTOCORT EC) 3 MG 24 hr capsule Take 9 mg by mouth daily.     [provider]  busPIRone (BUSPAR) 5 MG tablet Take 1 tablet (5 mg total) by mouth 2 (two) times daily. 07/15/17   Ursula Alert, MD  escitalopram (LEXAPRO) 10 MG tablet Take 1 tablet (10 mg total) by mouth daily. 07/15/17   Eappen,  Saramma, MD  folic acid (FOLVITE) 1 MG tablet TAKE 1 TABLET BY MOUTH EVERY DAY. TAKE 2 TABLETS ON DAY WHEN YOU TAKE METHOTREXATE 06/14/17   Lin Landsman, MD  hydrOXYzine (VISTARIL) 25 MG capsule Take 1 capsule (25 mg total) by mouth daily as needed (severe anxiety attacks). 07/15/17   Ursula Alert, MD  inFLIXimab (REMICADE) 100 MG injection Start 35m/kg IV on weeks 0,2,6 then continue every 8 weeks thereafter 01/10/17   VLin Landsman MD    methotrexate (RHEUMATREX) 2.5 MG tablet Take 10 tablets by mouth once weekly for 12 weeks Caution:Chemotherapy. Protect from light. Patient taking differently: Take 10 tablets by mouth once weekly for 12 weeks (Mondays) 03/11/17   VMarius Ditch RTally Due MD    Allergies Patient has no known allergies.  Family History  Problem Relation Age of Onset  . Diabetes Paternal Grandfather   . Heart disease Paternal Grandfather   . Depression Mother   . Drug abuse Mother   . Osteoporosis Neg Hx     Social History Social History   Tobacco Use  . Smoking status: Never Smoker  . Smokeless tobacco: Former UNetwork engineerUse Topics  . Alcohol use: Yes    Frequency: Never  . Drug use: No    Comment: hx of 3 year ago     Review of Systems Constitutional: No fever/chills Eyes: No visual changes. ENT: No sore throat. Cardiovascular: Denies chest pain. Respiratory: Denies shortness of breath. Gastrointestinal: Positive for abdominal pain.  No nausea, no vomiting.  Positive for diarrhea.  No constipation. Genitourinary: Negative for dysuria. Musculoskeletal: Negative for back pain. Skin: Negative for rash. Neurological: Negative for headaches, focal weakness or numbness.   ____________________________________________   PHYSICAL EXAM:  VITAL SIGNS: ED Triage Vitals  Enc Vitals Group     BP 07/18/17 2027 109/72     Pulse Rate 07/18/17 2027 99     Resp 07/18/17 2027 17     Temp 07/18/17 2027 99.1 F (37.3 C)     Temp Source 07/18/17 2027 Oral     SpO2 07/18/17 2027 99 %     Weight 07/18/17 2029 140 lb (63.5 kg)     Height 07/18/17 2029 5' 5"  (1.651 m)     Head Circumference --      Peak Flow --      Pain Score 07/18/17 2028 3     Pain Loc --      Pain Edu? --      [place]     Constitutional: Alert and oriented x4 appears somewhat uncomfortable but pleasant cooperative speaks in full clear sentences no diaphoresis Eyes: PERRL EOMI. Head: Atraumatic. Nose: No  congestion/rhinnorhea. Mouth/Throat: No trismus Neck: No stridor.   Cardiovascular: Normal rate, regular rhythm. Grossly normal heart sounds.  Good peripheral circulation. Respiratory: Normal respiratory effort.  No retractions. Lungs CTAB and moving good air Gastrointestinal: Soft abdomen diffuse mild tenderness with no rebound or guarding no peritonitis Musculoskeletal: No lower extremity edema   Neurologic:  Normal speech and language. No gross focal neurologic deficits are appreciated. Skin:  Skin is warm, dry and intact. No rash noted. Psychiatric: Mood and affect are normal. Speech and behavior are normal.    ____________________________________________   DIFFERENTIAL includes but not limited to  C. difficile, norovirus, bowel obstruction, colitis, Crohn's flare ____________________________________________   LABS (all labs ordered are listed, but only abnormal results are displayed)  Labs Reviewed  COMPREHENSIVE METABOLIC PANEL - Abnormal; Notable for the  following components:      Result Value   Potassium 3.1 (*)    Glucose, Bld 108 (*)    Total Bilirubin 1.4 (*)    All other components within normal limits  URINALYSIS, COMPLETE (UACMP) WITH MICROSCOPIC - Abnormal; Notable for the following components:   Color, Urine AMBER (*)    APPearance CLEAR (*)    Ketones, ur 5 (*)    Protein, ur 30 (*)    All other components within normal limits  C DIFFICILE QUICK SCREEN W PCR REFLEX  GASTROINTESTINAL PANEL BY PCR, STOOL (REPLACES STOOL CULTURE)  LIPASE, BLOOD  CBC    Lab work reviewed by me with no signs of C. difficile.  Slight dehydration __________________________________________  EKG   ____________________________________________  RADIOLOGY   ____________________________________________   PROCEDURES  Procedure(s) performed: no  Procedures  Critical Care performed: no  Observation: no ____________________________________________   INITIAL  IMPRESSION / ASSESSMENT AND PLAN / ED COURSE  Pertinent labs & imaging results that were available during my care of the patient were reviewed by me and considered in my medical decision making (see chart for details).  Patient arrives hemodynamically stable although lightheaded and somewhat dehydrated appearing.  I discussed with on-call GI Dr. Vicente Males who indicated he is very difficult to tell the difference to be in a Crohn's flare and C. difficile me recommends evaluation with a stool sample today and if it is positive to treat with oral vancomycin.  If it is negative they would like to see him in clinic this week.  Fortunately the patient's C. difficile is negative.  He feels improved after fluids and at this point as he is hemodynamic stable I think it reasonable to treat him as an outpatient.  I will defer any steroids to his gastroenterologist Dr. [name].  He is discharged home in improved condition verbalizes understanding agreement plan with strict return precautions.      ____________________________________________   FINAL CLINICAL IMPRESSION(S) / ED DIAGNOSES  Final diagnoses:  Diarrhea, unspecified type      NEW MEDICATIONS STARTED DURING THIS VISIT:  Discharge Medication List as of 07/18/2017 11:58 PM       Note:  This document was prepared using Dragon voice recognition software and may include unintentional dictation errors.     Darel Hong, MD 07/19/17 2340

## 2017-07-18 NOTE — ED Triage Notes (Signed)
Pt presents to ED with c/o generalized abd cramping / aching for the past 3 days with frequent (8-9X a day) diarrhea. Pt states he had c-diff in march and reports current symptoms are similar. Denies fever or vomiting.

## 2017-07-19 ENCOUNTER — Encounter: Payer: Self-pay | Admitting: Gastroenterology

## 2017-07-19 LAB — GASTROINTESTINAL PANEL BY PCR, STOOL (REPLACES STOOL CULTURE)

## 2017-07-20 ENCOUNTER — Encounter: Payer: Self-pay | Admitting: Gastroenterology

## 2017-07-20 ENCOUNTER — Ambulatory Visit (INDEPENDENT_AMBULATORY_CARE_PROVIDER_SITE_OTHER): Payer: BC Managed Care – PPO | Admitting: Gastroenterology

## 2017-07-20 ENCOUNTER — Other Ambulatory Visit
Admission: RE | Admit: 2017-07-20 | Discharge: 2017-07-20 | Disposition: A | Discharge status: Home / Self Care | Payer: BC Managed Care – PPO | Source: Ambulatory Visit | Attending: Gastroenterology | Admitting: Gastroenterology

## 2017-07-20 VITALS — BP 110/70 | HR 109 | Resp 18 | Ht 65.0 in | Wt 137.4 lb

## 2017-07-20 DIAGNOSIS — F419 Anxiety disorder, unspecified: Secondary | ICD-10-CM | POA: Diagnosis not present

## 2017-07-20 DIAGNOSIS — Z79899 Other long term (current) drug therapy: Secondary | ICD-10-CM | POA: Diagnosis not present

## 2017-07-20 DIAGNOSIS — F329 Major depressive disorder, single episode, unspecified: Secondary | ICD-10-CM | POA: Diagnosis not present

## 2017-07-20 DIAGNOSIS — K50812 Crohn's disease of both small and large intestine with intestinal obstruction: Secondary | ICD-10-CM | POA: Diagnosis not present

## 2017-07-20 DIAGNOSIS — J45909 Unspecified asthma, uncomplicated: Secondary | ICD-10-CM | POA: Insufficient documentation

## 2017-07-20 LAB — COMPREHENSIVE METABOLIC PANEL
ALT: 21 U/L (ref 17–63)
AST: 21 U/L (ref 15–41)
Albumin: 4.3 g/dL (ref 3.5–5.0)
Alkaline Phosphatase: 79 U/L (ref 38–126)
Anion gap: 14 (ref 5–15)
BILIRUBIN TOTAL: 1.9 mg/dL — AB (ref 0.3–1.2)
BUN: 7 mg/dL (ref 6–20)
CHLORIDE: 98 mmol/L — AB (ref 101–111)
CO2: 25 mmol/L (ref 22–32)
CREATININE: 1.19 mg/dL (ref 0.61–1.24)
Calcium: 8.7 mg/dL — ABNORMAL LOW (ref 8.9–10.3)
GFR calc Af Amer: >60 mL/min (ref >60)
Glucose, Bld: 98 mg/dL (ref 65–99)
Potassium: 3.1 mmol/L — ABNORMAL LOW (ref 3.5–5.1)
Sodium: 137 mmol/L (ref 135–145)
Total Protein: 7.9 g/dL (ref 6.5–8.1)

## 2017-07-20 LAB — FERRITIN: Ferritin: 114 ng/mL (ref 24–336)

## 2017-07-20 LAB — CBC WITH DIFFERENTIAL/PLATELET
BLASTS: 0 %
Band Neutrophils: 28 %
Basophils Absolute: 0 10*3/uL (ref 0–0.1)
Basophils Relative: 0 %
Eosinophils Absolute: 0.1 10*3/uL (ref 0–0.7)
Eosinophils Relative: 1 %
HEMATOCRIT: 46.9 % (ref 40.0–52.0)
Hemoglobin: 16.5 g/dL (ref 13.0–18.0)
Lymphocytes Relative: 29 %
Lymphs Abs: 2.6 10*3/uL (ref 1.0–3.6)
MCH: 29.8 pg (ref 26.0–34.0)
MCHC: 35.1 g/dL (ref 32.0–36.0)
MCV: 84.8 fL (ref 80.0–100.0)
METAMYELOCYTES PCT: 1 %
MYELOCYTES: 0 %
Monocytes Absolute: 1.9 10*3/uL — ABNORMAL HIGH (ref 0.2–1.0)
Monocytes Relative: 21 %
Neutro Abs: 4.5 10*3/uL (ref 1.4–6.5)
Neutrophils Relative %: 20 %
Other: 0 %
Platelets: 265 10*3/uL (ref 150–440)
Promyelocytes Relative: 0 %
RBC: 5.53 MIL/uL (ref 4.40–5.90)
RDW: 12.1 % (ref 11.5–14.5)
WBC: 9.1 10*3/uL (ref 3.8–10.6)
nRBC: 0 /100 WBC

## 2017-07-20 LAB — VITAMIN B12: VITAMIN B 12: 420 pg/mL (ref 180–914)

## 2017-07-20 LAB — C-REACTIVE PROTEIN: CRP: 16.5 mg/dL — ABNORMAL HIGH (ref <1.0)

## 2017-07-20 MED ORDER — BUDESONIDE 3 MG PO CPEP: 9.0000 mg | ORAL_CAPSULE | Freq: Every day | ORAL | 0 refills | Status: DC

## 2017-07-20 MED ORDER — ONDANSETRON HCL 4 MG PO TABS: 4.0000 mg | ORAL_TABLET | Freq: Three times a day (TID) | ORAL | 1 refills | Status: DC | PRN

## 2017-07-20 NOTE — Progress Notes (Signed)
we'll  Leroy Darby, MD [address]  [place]  [place], [place] [address]  Main: [phone]  Fax: [phone]    Gastroenterology Consultation  Referring Provider:     [name], MD Primary Care Physician:  [name], MD Primary Gastroenterologist:  Dr. Cephas Hernandez Reason for Consultation:     Crohn's disease        HPI:   Leroy Hernandez is a 32 y.o. y/o male referred by Dr. Ouida Sills, [name], MD  for consultation & management of Crohn's disease. He has history of ileocolonic Crohn's diagnosed in 2007, detailed history described below is referred by [name] to establish care. He is currently on cymzia 400 mg every 2 weeks and Imuran 150 mg daily. Currently, he is experiencing nonbloody diarrhea up to 10 times a day associated with urgency and perianal purulent discharge from the fistula. He also reports tenderness in the right perianal area. He reports left-sided abdominal pain associated with nausea but no vomiting. He denies bloating, blood in the stools. He went to [facility] ER 2 days ago due to severe diarrhea and head is given prescription for oral prednisone but he did not filling the prescription yet. He denies fever, chills, loss of appetite, weight loss. He has been adherent with his medications.   Follow up visit 01/10/2017: Since last visit, I started him on entocort, cipro, cortenema for flare up of his symptoms. He gained about 5lbs since last visit. 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 Remicade process was started. He reports some rectal urgency and minimal rectal bleeding mostly on wiping. When I went over medications with him, he doesn't really know what he is taking and he did not bring his medication bottles with him even though I reminded him during last visit. He is not on entocort. He has seen endocrine and also hematology for iron deficiency anemia and scheduled for iron  infusion today  Follow-up visit 05/03/2017 He started on Remicade, completed induction. Stopped Imuran, started on Weekly methotrexate along with daily folate. He reports that he has been tolerating Remicade infusions very well. He gained weight. His stools are more formed, no recurrence of perianal fistulas. He denies rectal bleeding or bloody diarrhea. She recently had flareup of symptoms, was found to have norovirus as well as C. Difficile toxin positive when he went to emergency room on 04/30/2017. E also has significant leukocytosis. Underwent CT scan which did not show acute GI pathology. He was hospitalized for 1 day, discharged on oral vancomycin 125 mg 4 times a day. Patient is here for follow-up. He reports that his diarrhea is improving, currently having 4 semi-formed bowel movements daily without any blood. His appetite is improving.He reports being adherent to the medications. He denies any other complaints today. He reports that his energy levels are better, able to work longer hours.  Follow-up visit 07/20/2017 He went to [facility] ER 2 days ago due to one-week history of severe nonbloody diarrhea, fatigue, chills, nausea. He said he ate spaghetti with chicken and beef about a week ago, started experiencing several episodes of nonbloody diarrhea, particularly postprandial and sometimes at night. He also reports he may have had mild flareup of the perianal fistula drained and currently not draining much. In the ER, he had CBC and CMP which were unremarkable, GI pathogen panel and C. Difficile studies were negative. He is due for next dose of Remicade tomorrow. He  reports taking methotrexate weekly along with folate daily.  Crohn's disease classification:  Age: 16 to 28 Location: ileocolonic  Behavior: stricturing and penetrating  Perianal: Yes  IBD diagnosis:2007  Disease course: He was diagnosed in 2007, was experiencing right lower quadrant pain and bloating,  was on oral mesalamine, several courses of prednisone, underwent first surgery in 2011 of transverse colon resection at [facility]. He continued to have diarrhea and required repeated courses of prednisone, established care at [facility] and underwent colonoscopy which revealed severe ileocolonic disease, underwent ileocolonic resection in 11/2011. He reports being started on Humira and Imuran since then and was doing well for some time. He was also taking marijuana and it significantly helped with his symptoms. Subsequently, he had frequent flareups, several hospitalizations and ER visits almost every month secondary to perianal fistula, abscess and bloody diarrhea. He was receiving short courses of antibiotics. He had I&D of right perirectal abscesses in May/2017. He does not recall having a seton placement. His colonoscopy in 03/2015 revealed mild active colitis and anastomotic stricture that was dilated with balloon to 12 mm. He underwent another colonoscopy in 12/2015 which revealed inflammation in the neoterminal ileum as well as colon and was found to have moderately active ileocolonic disease. He was then switched to [place] in 05/2016 and stayed on Imuran 150 mg daily. He was feeling better overall but continued to have active perianal disease resulting in urgency and bloody stools. CT A/P from 06/2016 revealed right perirectal fistula with developing fluid collection concerning for abscess. MRE from 09/2016 revealed no evidence of active disease, however the fistula could not be visualized. His CRP improved from 10 in 06/2016 -1.7 in 09/2016. 09/2016 Trial of cipro+flagyl BID and rowasa enema resulted in significant improvement of symptoms. 11/2016 Return of fistula and rectal bleeding after tapering antibiotic. 12/2016 Restarted cipro, entocort, cortenema, decreased imuran to 50m. EGD and colonoscopy revealed gastric granuloma, Rutgeert's i4. Stool studies negative. Fecal calprotectin 370. C.  Difficile infection in 04/2017, treated with oral vancomycin  Extra intestinal manifestations: Imaging revealed chronic sacroiliitis. He denies skin, eye manifestations  IBD surgical history: 2011 : Intestinal resection, transverse colon Diagnosis:  TRANSVERSE COLON AND PROXIMAL TRANSVERSE COLON RESECTION:  SEGMENTS OF BOWEL WITH MODERATE TO FOCALLY SEVERE CHRONIC ACTIVE COLITIS.  EXTENSIVE TRANSMURAL INFLAMMATION.  CRYTPITIS AND CRYPT ABSCESSES ARE NOTED.  THE RESECTION MARGINS APPEAR CLEAR.  THERE IS NO EVIDENCE OF DYSPLASIA OR MALIGNANCY.  NINETEEN BENIGN LYMPH NODES. (0/19)  12/01/[facility]. KLauna Flight- 9 cm TI, 5.5 cm long large bowel Diagnosis:  A:Ileum and colon, resection  -Segment of ileum and colon with moderate to severely active chronic  enteritis, mildly active chronic colitis, and intact anastomosis at distal end  of specimen  -Surgical margins are viable; mildly active chronic colitis is present at  distal margin  -Apparent stricture near ileocecal valve  -Inflammatory polyp near ileocecal valve  -Fibrous obliteration of appendiceal lumen  -Six unremarkable lymph nodes examined microscopically  -No granulomas, viral cytopathic effect, or dysplasia identified   07/04/2015-incision and drainage of right perirectal abscess Imaging:  MRE-09/21/2016 IMPRESSION: 1. Stable surgical changes from prior colon resection and ileal colonic anastomosis. No findings for active Crohn's disease. 2. No acute abdominal/pelvic findings, mass lesions or adenopathy.  CT A/P 06/22/2016 IMPRESSION: 1. The fistula between the right lateral aspect of the rectum and the skin is again identified. The portion of the fistula immediately beneath the skin is more prominent in caliber in  the interval with central decreased attenuation worrisome for a developing fluid collection in the distal fistula immediately beneath the  cutaneous surface. A developing abscess cannot be excluded. Recommend clinical correlation. 2. Fluid throughout the remaining colon consistent with history of diarrhea. No colonic or small bowel inflammation identified. The rectum is poorly evaluated due to lack of distention. 3. 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  SBFT none  Procedures: Colonoscopy 09/2009 Diagnosis:  TRANSVERSE COLON MUCOSA COLD BIOPSY:  - MODERATE CHRONIC ACTIVE COLITIS, CONSISTENT WITH PATIENTS  HISTORY OF CROHNS DISEASE.  - NEGATIVE FOR DYSPLASIA AND MALIGNANCY.   Colonoscopy 06/2010 Diagnosis:  ULCERATED RIGHT SIDED COLON BIOPSY:  - MILD CHRONIC ACTIVE COLITIS, COMPATIBLE WITH PATIENTS KNOWN  HISTORY OF CROHNS DISEASE.  - NEGATIVE FOR DYSPLASIA AND MALIGNANCY.   Colonoscopies 07/2012 Diagnosis:  ANASTOMOTIC STRICTURE COLD BIOPSY:  - MILD CHRONIC ACTIVE COLITIS, COMPATIBLE WITH PATIENTS KNOWN  HISTORY OF CROHNS DISEASE.  - NEGATIVE FOR DYSPLASIA AND MALIGNANCY.   Colonoscopies 07/2013 Diagnosis:  ILEUM BIOPSY:  - SMALL BOWEL MUCOSA WITH PRESERVED VILLOUS ARCHITECTURE.  - NEGATIVE FOR INTRAEPITHELIAL LYMPHOCYTOSIS, DYSPLASIA AND  MALIGNANCY.   Colonoscopy 03/2015 Multiple ulcers in the sigmoid colon, no bleeding was present biopsies were taken, tight stricture at the ileocolonic anastomosis unable to get through scope, dilated with 12 mm TTS balloon. The terminal ileum appeared normal DIAGNOSIS:  A. SIGMOID COLON; COLD BIOPSY:  - MILD CHRONIC ACTIVE COLITIS.  - NEGATIVE FOR DYSPLASIA AND MALIGNANCY.   Colonoscopy 12/2015 There was evidence of prior end-to-end ileocolonic anastomosis in the transverse colon. This was characterized by mild stenosis. The anastomosis was traversed. Scattered inflammation, moderate in severity and characterized by shallow ulcerations was found in the proximal ileum. Biopsies were taken with the cold forceps for histology. Inflammation  characterized by shallow ulcerations were found no sites were spared. This was moderate in severity. Biopsies were taken. Perianal fistula found on perianal exam. Pathology: Small intestine-severe chronic active ileitis with ulceration. 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 - Patent functional end-to-end ileo-colonic anastomosis, characterized by mild stenosis. Dilated to 52m. - Ulcerated mucosa in the neo-terminal ileum. Biopsied. - Mucosal ulceration in colon. Biopsied. - Active ileocolonic Crohn's disease, Rutgeert's i4 on cimzia and imuran - The distal rectum and anal verge are normal on retroflexion view.  Upper Endoscopy 12/2016 - Normal second portion of the duodenum and third portion of the duodenum. Biopsied. - Multiple non-bleeding duodenal ulcers with a clean ulcer base (Forrest Class III). Biopsied. - Normal stomach. Biopsied. - Normal gastroesophageal junction and esophagus.   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 Stopped Imuran in 01/2017 TPMT status homozygous Methotrexate: started in 02/2017 along with daily folate  Biologics:  Anti TNFs: Humira until end of 2017, stopped due to recurrence of postoperative Crohn's. Cimzia started in 05/28/2016 along with Imuran. Cimzia stopped in 01/2017. Remicade started in 02/2017 Anti Integrins:   Ustekinumab: Tofactinib: Clinical trial:   Past Medical History:  Diagnosis Date  .  Anxiety   . Asthma   . Crohn's disease ([place])   . Depression   . Rectal fistula     Past Surgical History:  Procedure Laterality Date  . COLON RESECTION    . COLON SURGERY    . COLONOSCOPY WITH PROPOFOL N/A 04/03/2015   Procedure: COLONOSCOPY WITH PROPOFOL;  Surgeon: Hulen Luster, MD;  Location: [facility] ENDOSCOPY;  Service: Endoscopy;  Laterality: N/A;  . COLONOSCOPY WITH PROPOFOL N/A 12/22/2015   Procedure: COLONOSCOPY WITH PROPOFOL;  Surgeon: [name], MD;  Location: [place];  Service: Endoscopy;  Laterality: N/A;  . COLONOSCOPY WITH PROPOFOL N/A 12/22/2016   Procedure: COLONOSCOPY WITH PROPOFOL;  Surgeon: Lin Landsman, MD;  Location: [place];  Service: Endoscopy;  Laterality: N/A;  . ESOPHAGOGASTRODUODENOSCOPY (EGD) WITH PROPOFOL N/A 12/22/2016   Procedure: ESOPHAGOGASTRODUODENOSCOPY (EGD) WITH PROPOFOL;  Surgeon: Lin Landsman, MD;  Location: [place];  Service: Endoscopy;  Laterality: N/A;  . rectal fistula surgery    . WRIST SURGERY       Current Outpatient Medications:  .  ARIPiprazole (ABILIFY) 5 MG tablet, Take 1 tablet (5 mg total) by mouth at bedtime., Disp: 90 tablet, Rfl: 1 .  busPIRone (BUSPAR) 5 MG tablet, Take 1 tablet (5 mg total) by mouth 2 (two) times daily., Disp: 180 tablet, Rfl: 1 .  escitalopram (LEXAPRO) 10 MG tablet, Take 1 tablet (10 mg total) by mouth daily., Disp: 90 tablet, Rfl: 1 .  folic acid (FOLVITE) 1 MG tablet, TAKE 1 TABLET BY MOUTH EVERY DAY. TAKE 2 TABLETS ON DAY WHEN YOU TAKE METHOTREXATE, Disp: 90 tablet, Rfl: 0 .  hydrOXYzine (VISTARIL) 25 MG capsule, Take 1 capsule (25 mg total) by mouth daily as needed (severe anxiety attacks)., Disp: 90 capsule, Rfl: 1 .  inFLIXimab (REMICADE) 100 MG injection, Start 67m/kg IV on weeks 0,2,6 then continue every 8 weeks thereafter, Disp: 1 each, Rfl: 3 .  methotrexate  (RHEUMATREX) 2.5 MG tablet, Take 10 tablets by mouth once weekly for 12 weeks Caution:Chemotherapy. Protect from light. (Patient taking differently: Take 10 tablets by mouth once weekly for 12 weeks (Mondays)), Disp: 120 tablet, Rfl: 0 .  benztropine (COGENTIN) 0.5 MG tablet, Take 1 tablet (0.5 mg total) by mouth at bedtime. (Patient not taking: Reported on 07/20/2017), Disp: 90 tablet, Rfl: 1 .  budesonide (ENTOCORT EC) 3 MG 24 hr capsule, Take 9 mg by mouth daily. , Disp: , Rfl:  .  budesonide (ENTOCORT EC) 3 MG 24 hr capsule, Take 3 capsules (9 mg total) by mouth daily., Disp: 90 capsule, Rfl: 0 .  ondansetron (ZOFRAN) 4 MG tablet, Take 1 tablet (4 mg total) by mouth every 8 (eight) hours as needed for nausea or vomiting., Disp: 30 tablet, Rfl: 1   Family History  Problem Relation Age of Onset  . Diabetes Paternal Grandfather   . Heart disease Paternal Grandfather   . Depression Mother   . Drug abuse Mother   . Osteoporosis Neg Hx      Social History   Tobacco Use  . Smoking status: Never Smoker  . Smokeless tobacco: Former UNetwork engineerUse Topics  . Alcohol use: Yes    Frequency: Never  . Drug use: No    Comment: hx of 3 year ago     Allergies as of 07/20/2017  . (No Known Allergies)    Review of Systems:    All systems reviewed and negative except where noted in HPI.   Physical Exam:  BP 110/70 (BP Location: Left Arm, Patient Position: Sitting, Cuff Size: Normal)   Pulse (!) 109   Resp 18   Ht 5' 5"  (1.651 m)   Wt 137 lb 6.4 oz (62.3 kg)   BMI 22.86 kg/m  No LMP for male patient. Filed Weights   07/20/17 1414  Weight: 137 lb 6.4 oz (62.3 kg)   General:   Alert,  Thin bulit, pleasant and cooperative in NAD Head:  Normocephalic and atraumatic. Eyes:  Sclera clear, no icterus.   Conjunctiva pale. Ears:  Normal auditory acuity. Nose:  No deformity, discharge, or lesions. Mouth:  No deformity or lesions,oropharynx pink & moist. Neck:  Supple; no masses or  thyromegaly. Lungs:  Respirations even and unlabored.  Clear throughout to auscultation.   No wheezes, crackles, or rhonchi. No acute distress. Heart:  Regular rate and rhythm; no murmurs, clicks, rubs, or gallops. Abdomen:  Normal bowel sounds.  No bruits.  Soft, non-tender and non-distended without masses, hepatosplenomegaly or hernias noted.  No guarding or rebound tenderness.  Midline vertical scar from prior surgery Rectal: Not performed Msk:  Symmetrical without gross deformities. Good, equal movement & strength bilaterally. Pulses:  Normal pulses noted. Extremities:  No clubbing or edema.  No cyanosis. Neurologic:  Alert and oriented x3;  grossly normal neurologically. Skin:  Intact without significant lesions or rashes. No jaundice. Psych:  Alert and cooperative. Normal mood and affect.   Assessment and Plan:   Leroy Hernandez is a 32 y.o. caucasian male with Ileocolonic Crohn's disease diagnosed in 2007, stricturing and penetrating with perianal fistula, previously on Humira and Imuran, recurrence of postoperative Crohn's, resulted in discontinuation of Humira. Switched to [place] in 05/2016 biweekly and continued Imuran 150 mg daily which resulted in clinical remission followed by recurrence of luminal disease and perianal fistula that responded to antibiotics, then recurrence of symptoms after tapering abx, responded to entocort, EGD and colonoscopy 12/2016 revealed gastric crohn's and post op recurrence of Crohn's Rutgeert's i4, anastomotic stricture and status post dilation. Switched to Remicade and methotrexate. Currently in clinical remission, recent episode of C. Difficile being treated with oral vancomycin. 1 week of nonbloody diarrhea associated with nausea, chills, bloating. Differentials include flareup of Crohn's or viral gastroenteritis  Crohn's disease: 1. Continue Remicade, finished induction, currently on maintenance at 8 weeks interval. Eduacted patient about monitoring of  symptoms in between cycles to adjust the dose or check Remicade antibodies and levels 2. continue methotrexate 19m po weekly, follow cbc and LFTs every 358month3. Continue folic acid 79m48maily and 2mg29m day of methotrexate 4. With prescribed Entocort 3 mg 3 times daily for the flareup 5. Recommend colonoscopy to assess response to Remicade in next 3 months 6. We will obtain information on his Remicade infusions to date 7. Check CRP, fecal calprotectin, CBC, B12, ferritin levels   IBD Health Maintenance  1.TB status: PPD skin test negative as of October, 2017, quantiferon gold ordered today 2. Anemia: has iron deficiency anemia secondary to active Crohn's disease. Received parenteral iron infusions, 2, vitamin B12 is 397. Last ferritin level 84 in 02/2017 3.Immunizations: Hep A and B not immune, recommend Twinrix vaccine by his PCP. Influenza-receives annual influenza vaccine, last received in October/2017, he is due for prevnar and pneumovax 4.Cancer screening I) Colon cancer/dysplasia surveillance: Last colonoscopy in 12/2016, no dysplasia II) Cervical cancer: N/A III) Skin cancer - counseled about annual skin exam by dermatology and skin protection in summer using sun screen SPF > 50, clothing  5.Bone health: Has osteoporosis, being followed by endocrine Vitamin D status: Low vitamin D levels, completed vit D 50K. Recommended him to start vitamin D 500 units daily  Bone density testing: DEXA scan 09/29/2016 revealed osteoporosis, Z-score -3.3 5. Labs: every 69month 6. Smoking: Never smoker 7. NSAIDs and Antibiotics use: No history of NSAID use  Follow up in 4 weeks     RCephas Darby MD

## 2017-07-21 ENCOUNTER — Inpatient Hospital Stay: Admit: 2017-07-21 | Payer: Self-pay

## 2017-07-22 ENCOUNTER — Other Ambulatory Visit: Payer: Self-pay

## 2017-07-22 DIAGNOSIS — K50811 Crohn's disease of both small and large intestine with rectal bleeding: Secondary | ICD-10-CM

## 2017-07-22 DIAGNOSIS — K50812 Crohn's disease of both small and large intestine with intestinal obstruction: Secondary | ICD-10-CM

## 2017-07-22 MED ORDER — POTASSIUM CHLORIDE CRYS ER 20 MEQ PO TBCR: 40.0000 meq | EXTENDED_RELEASE_TABLET | Freq: Every day | ORAL | 0 refills | Status: DC

## 2017-07-28 ENCOUNTER — Ambulatory Visit: Payer: BC Managed Care – PPO | Admitting: Licensed Clinical Social Worker

## 2017-07-29 LAB — QUANTIFERON-TB GOLD PLUS (RQFGPL)
QUANTIFERON TB2 AG VALUE: 0.14 [IU]/mL
QuantiFERON Mitogen Value: >10 IU/mL
QuantiFERON Nil Value: 0.12 IU/mL
QuantiFERON TB1 Ag Value: 0.14 IU/mL

## 2017-07-29 LAB — QUANTIFERON-TB GOLD PLUS: QuantiFERON-TB Gold Plus: NEGATIVE

## 2017-08-02 ENCOUNTER — Telehealth: Payer: Self-pay | Admitting: Gastroenterology

## 2017-08-02 ENCOUNTER — Other Ambulatory Visit: Payer: Self-pay

## 2017-08-02 MED ORDER — PREDNISONE 20 MG PO TABS: 40.0000 mg | ORAL_TABLET | Freq: Every day | ORAL | 0 refills | Status: DC

## 2017-08-02 NOTE — Telephone Encounter (Signed)
Dr. [name] Please call girlfriend Elmyra Ricks # [phone]. [name] is not feeling better with his Chrone's disease.

## 2017-08-03 ENCOUNTER — Ambulatory Visit: Payer: BC Managed Care – PPO | Admitting: Gastroenterology

## 2017-08-04 NOTE — Telephone Encounter (Signed)
Pt is left vm he states he has a couple of questions please call

## 2017-08-05 NOTE — Telephone Encounter (Signed)
Spoke with patient and gave him clarification on medication, pt verbalized understanding

## 2017-08-18 ENCOUNTER — Ambulatory Visit: Payer: BC Managed Care – PPO | Admitting: Licensed Clinical Social Worker

## 2017-08-23 ENCOUNTER — Ambulatory Visit: Payer: BC Managed Care – PPO | Admitting: Gastroenterology

## 2017-08-29 ENCOUNTER — Other Ambulatory Visit: Payer: Self-pay | Admitting: Gastroenterology

## 2017-08-29 NOTE — Telephone Encounter (Signed)
Patient is requesting a refill on prednisone 10 mg tablets is it ok to refill?

## 2017-09-06 ENCOUNTER — Other Ambulatory Visit: Payer: Self-pay

## 2017-09-06 ENCOUNTER — Emergency Department
Admission: EM | Admit: 2017-09-06 | Discharge: 2017-09-06 | Disposition: A | Discharge status: Home / Self Care | Payer: BC Managed Care – PPO | Attending: Emergency Medicine | Admitting: Emergency Medicine

## 2017-09-06 DIAGNOSIS — Z87891 Personal history of nicotine dependence: Secondary | ICD-10-CM | POA: Diagnosis not present

## 2017-09-06 DIAGNOSIS — R197 Diarrhea, unspecified: Secondary | ICD-10-CM | POA: Diagnosis not present

## 2017-09-06 DIAGNOSIS — K6289 Other specified diseases of anus and rectum: Secondary | ICD-10-CM | POA: Diagnosis present

## 2017-09-06 DIAGNOSIS — K5090 Crohn's disease, unspecified, without complications: Secondary | ICD-10-CM | POA: Insufficient documentation

## 2017-09-06 DIAGNOSIS — J4520 Mild intermittent asthma, uncomplicated: Secondary | ICD-10-CM | POA: Diagnosis not present

## 2017-09-06 DIAGNOSIS — Z209 Contact with and (suspected) exposure to unspecified communicable disease: Secondary | ICD-10-CM | POA: Diagnosis not present

## 2017-09-06 LAB — COMPREHENSIVE METABOLIC PANEL
ALT: 24 U/L (ref 0–44)
AST: 15 U/L (ref 15–41)
Albumin: 3.7 g/dL (ref 3.5–5.0)
Alkaline Phosphatase: 87 U/L (ref 38–126)
Anion gap: 7 (ref 5–15)
BUN: 9 mg/dL (ref 6–20)
CALCIUM: 8.8 mg/dL — AB (ref 8.9–10.3)
CO2: 28 mmol/L (ref 22–32)
CREATININE: 0.92 mg/dL (ref 0.61–1.24)
Chloride: 105 mmol/L (ref 98–111)
GFR calc non Af Amer: >60 mL/min (ref >60)
Glucose, Bld: 106 mg/dL — ABNORMAL HIGH (ref 70–99)
Potassium: 3.3 mmol/L — ABNORMAL LOW (ref 3.5–5.1)
SODIUM: 140 mmol/L (ref 135–145)
TOTAL PROTEIN: 7.1 g/dL (ref 6.5–8.1)
Total Bilirubin: 1 mg/dL (ref 0.3–1.2)

## 2017-09-06 LAB — URINALYSIS, COMPLETE (UACMP) WITH MICROSCOPIC
BILIRUBIN URINE: NEGATIVE
Bacteria, UA: NONE SEEN
GLUCOSE, UA: NEGATIVE mg/dL
Hgb urine dipstick: NEGATIVE
KETONES UR: NEGATIVE mg/dL
Leukocytes, UA: NEGATIVE
NITRITE: NEGATIVE
PH: 5 (ref 5.0–8.0)
Protein, ur: NEGATIVE mg/dL
SPECIFIC GRAVITY, URINE: 1.023 (ref 1.005–1.030)
Squamous Epithelial / LPF: NONE SEEN (ref 0–5)

## 2017-09-06 LAB — C DIFFICILE QUICK SCREEN W PCR REFLEX
C DIFFICILE (CDIFF) INTERP: NOT DETECTED
C DIFFICILE (CDIFF) TOXIN: NEGATIVE
C Diff antigen: NEGATIVE

## 2017-09-06 LAB — CBC
HCT: 44.6 % (ref 40.0–52.0)
HEMOGLOBIN: 15.6 g/dL (ref 13.0–18.0)
MCH: 29.3 pg (ref 26.0–34.0)
MCHC: 35 g/dL (ref 32.0–36.0)
MCV: 83.7 fL (ref 80.0–100.0)
PLATELETS: 284 10*3/uL (ref 150–440)
RBC: 5.33 MIL/uL (ref 4.40–5.90)
RDW: 13.3 % (ref 11.5–14.5)
WBC: 10.2 10*3/uL (ref 3.8–10.6)

## 2017-09-06 LAB — GASTROINTESTINAL PANEL BY PCR, STOOL (REPLACES STOOL CULTURE)

## 2017-09-06 LAB — LIPASE, BLOOD: LIPASE: 50 U/L (ref 11–51)

## 2017-09-06 MED ORDER — OXYCODONE-ACETAMINOPHEN 5-325 MG PO TABS
2.0000 | ORAL_TABLET | Freq: Once | ORAL | Status: AC
  Administered 2017-09-06: 2 via ORAL
  Filled 2017-09-06: qty 2

## 2017-09-06 MED ORDER — DIPHENOXYLATE-ATROPINE 2.5-0.025 MG PO TABS: 1.0000 | ORAL_TABLET | Freq: Four times a day (QID) | ORAL | 1 refills | Status: DC | PRN

## 2017-09-06 MED ORDER — LOPERAMIDE HCL 2 MG PO CAPS
4.0000 mg | ORAL_CAPSULE | Freq: Once | ORAL | Status: AC
  Administered 2017-09-06: 4 mg via ORAL
  Filled 2017-09-06: qty 2

## 2017-09-06 NOTE — ED Provider Notes (Signed)
[facility] Emergency Department Provider Note       Time seen: ----------------------------------------- 8:40 PM on 09/06/2017 -----------------------------------------   I have reviewed the triage vital signs and the nursing notes.  HISTORY   Chief Complaint Rectal Pain and Diarrhea    HPI Leroy Hernandez is a 32 y.o. male with a history of anxiety, asthma, Crohn's disease, depression and rectal fistula who presents to the ED for rectal pain as well as diarrhea for the past 3 weeks.  Patient states he was seen here 3 weeks ago and he just finished his prescribed prednisone.  Patient states his girlfriend was recently diagnosed with C. difficile and is worried he may have become infected from her.  He reports 10 episodes of diarrhea in the last 24 hours with a previous history of Crohn's and testing positive for C. difficile in the past.  Past Medical History:  Diagnosis Date  . Anxiety   . Asthma   . Crohn's disease ([employer])   . Depression   . Rectal fistula     Patient Active Problem List   Diagnosis Date Noted  . H/O Clostridium difficile infection 05/03/2017  . Gastroenteritis due to norovirus 05/03/2017  . Iron deficiency anemia 12/28/2016  . Osteoporosis 12/09/2016  . Generalized abdominal pain   . Perirectal fistula   . Sepsis ([employer]) 06/22/2016  . Fracture of metacarpal bone 06/09/2016  . Calculus of gallbladder without cholecystitis without obstruction 12/25/2015  . Gallbladder polyp 12/25/2015  . Blood in stool   . Intestinal bypass or anastomosis status   . Crohn's disease of both small and large intestine with rectal bleeding ([place])   . Perirectal abscess 07/04/2015  . Mild episode of recurrent major depressive disorder ([place]) 04/02/2015  . Asthma, mild intermittent 04/02/2015    Past Surgical History:  Procedure Laterality Date  . COLON RESECTION    . COLON SURGERY    . COLONOSCOPY WITH PROPOFOL N/A 04/03/2015   Procedure:  COLONOSCOPY WITH PROPOFOL;  Surgeon: Hulen Luster, MD;  Location: [facility] ENDOSCOPY;  Service: Endoscopy;  Laterality: N/A;  . COLONOSCOPY WITH PROPOFOL N/A 12/22/2015   Procedure: COLONOSCOPY WITH PROPOFOL;  Surgeon: [name], MD;  Location: [place];  Service: Endoscopy;  Laterality: N/A;  . COLONOSCOPY WITH PROPOFOL N/A 12/22/2016   Procedure: COLONOSCOPY WITH PROPOFOL;  Surgeon: Lin Landsman, MD;  Location: [place];  Service: Endoscopy;  Laterality: N/A;  . ESOPHAGOGASTRODUODENOSCOPY (EGD) WITH PROPOFOL N/A 12/22/2016   Procedure: ESOPHAGOGASTRODUODENOSCOPY (EGD) WITH PROPOFOL;  Surgeon: Lin Landsman, MD;  Location: [place];  Service: Endoscopy;  Laterality: N/A;  . rectal fistula surgery    . WRIST SURGERY      Allergies Patient has no known allergies.  Social History Social History   Tobacco Use  . Smoking status: Never Smoker  . Smokeless tobacco: Former Network engineer Use Topics  . Alcohol use: Yes    Frequency: Never  . Drug use: No    Comment: hx of 3 year ago    Review of Systems Constitutional: Negative for fever. Cardiovascular: Negative for chest pain. Respiratory: Negative for shortness of breath. Gastrointestinal: Positive for abdominal pain, rectal pain and diarrhea Musculoskeletal: Negative for back pain. Skin: Negative for rash. Neurological: Negative for headaches, focal weakness or numbness.  All systems negative/normal/unremarkable except as stated in the HPI  ____________________________________________   PHYSICAL EXAM:  VITAL SIGNS: ED Triage Vitals [09/06/17 1916]  Enc Vitals Group     BP  112/75     Pulse Rate 97     Resp 17     Temp 98.6 F (37 C)     Temp Source Oral     SpO2 97 %     Weight 135 lb (61.2 kg)     Height 5' 5"  (1.651 m)     Head Circumference      Peak Flow      Pain Score 6     Pain Loc      Pain Edu?      Excl. in [place]?    Constitutional: Alert and oriented. Well  appearing and in no distress. Eyes: Conjunctivae are normal. Normal extraocular movements. Cardiovascular: Normal rate, regular rhythm. No murmurs, rubs, or gallops. Respiratory: Normal respiratory effort without tachypnea nor retractions. Breath sounds are clear and equal bilaterally. No wheezes/rales/rhonchi. Gastrointestinal: Soft and nontender. Normal bowel sounds Musculoskeletal: Nontender with normal range of motion in extremities. No lower extremity tenderness nor edema. Neurologic:  Normal speech and language. No gross focal neurologic deficits are appreciated.  Skin:  Skin is warm, dry and intact. No rash noted. Psychiatric: Mood and affect are normal. Speech and behavior are normal.  ____________________________________________  ED COURSE:  As part of my medical decision making, I reviewed the following data within the [place] History obtained from family if available, nursing notes, old chart and ekg, as well as notes from prior ED visits. Patient presented for rectal pain and diarrhea, we will assess with labs as indicated at this time. Clinical Course as of Sep 06 2313  Tue Sep 06, 2017  2229 C Diff antigen: NEGATIVE [JW]    Clinical Course User Index [JW] Earleen Newport, MD   Procedures ____________________________________________   LABS (pertinent positives/negatives)  Labs Reviewed  COMPREHENSIVE METABOLIC PANEL - Abnormal; Notable for the following components:      Result Value   Potassium 3.3 (*)    Glucose, Bld 106 (*)    Calcium 8.8 (*)    All other components within normal limits  URINALYSIS, COMPLETE (UACMP) WITH MICROSCOPIC - Abnormal; Notable for the following components:   Color, Urine YELLOW (*)    APPearance CLEAR (*)    All other components within normal limits  C DIFFICILE QUICK SCREEN W PCR REFLEX  GASTROINTESTINAL PANEL BY PCR, STOOL (REPLACES STOOL CULTURE)  LIPASE, BLOOD  CBC    ____________________________________________  DIFFERENTIAL DIAGNOSIS   Dehydration, electrolyte abnormality, Crohn's disease, infectious colitis  FINAL ASSESSMENT AND PLAN  Diarrhea, rectal pain   Plan: The patient had presented for diarrhea and rectal pain likely secondary to Crohn's disease. Patient's labs were reassuring. He is cleared for outpatient GI follow up.    Laurence Aly, MD   Note: This note was generated in part or whole with voice recognition software. Voice recognition is usually quite accurate but there are transcription errors that can and very often do occur. I apologize for any typographical errors that were not detected and corrected.     Earleen Newport, MD 09/06/17 [phone]

## 2017-09-06 NOTE — ED Notes (Signed)
Resumed care from [place] rn   Pt alert and watching tv.  No pain.

## 2017-09-06 NOTE — ED Triage Notes (Signed)
Pt arrives to ED via POV from home with c/o rectal pain and diarrhea x3 weeks. Pt reports being seen for same 3 weeks ago and finished his r/x'd abx. Pt states his gf was recently d/x'd with c.diff and his worried he might be infected. Pt reports 10 episodes of diarrhea in the last 24 hrs; pt also states h/x of Crohn's disease.

## 2017-09-09 ENCOUNTER — Other Ambulatory Visit: Payer: Self-pay

## 2017-09-09 ENCOUNTER — Encounter: Payer: Self-pay | Admitting: Gastroenterology

## 2017-09-09 ENCOUNTER — Other Ambulatory Visit
Admission: RE | Admit: 2017-09-09 | Discharge: 2017-09-09 | Disposition: A | Discharge status: Home / Self Care | Payer: BC Managed Care – PPO | Source: Ambulatory Visit | Attending: Gastroenterology | Admitting: Gastroenterology

## 2017-09-09 ENCOUNTER — Other Ambulatory Visit: Payer: Self-pay | Admitting: Gastroenterology

## 2017-09-09 ENCOUNTER — Ambulatory Visit (INDEPENDENT_AMBULATORY_CARE_PROVIDER_SITE_OTHER): Payer: BC Managed Care – PPO | Admitting: Gastroenterology

## 2017-09-09 VITALS — BP 112/75 | HR 100 | Resp 17 | Ht 65.0 in | Wt 130.4 lb

## 2017-09-09 DIAGNOSIS — K50819 Crohn's disease of both small and large intestine with unspecified complications: Secondary | ICD-10-CM

## 2017-09-09 DIAGNOSIS — K50012 Crohn's disease of small intestine with intestinal obstruction: Secondary | ICD-10-CM

## 2017-09-09 DIAGNOSIS — K50812 Crohn's disease of both small and large intestine with intestinal obstruction: Secondary | ICD-10-CM | POA: Insufficient documentation

## 2017-09-09 MED ORDER — CIPROFLOXACIN HCL 500 MG PO TABS: 500.0000 mg | ORAL_TABLET | Freq: Two times a day (BID) | ORAL | 0 refills | Status: DC

## 2017-09-09 MED ORDER — METRONIDAZOLE 500 MG PO TABS: 500.0000 mg | ORAL_TABLET | Freq: Two times a day (BID) | ORAL | 0 refills | Status: DC

## 2017-09-09 NOTE — Progress Notes (Signed)
we'll  Leroy Darby, MD [address]  [place], [place] [address]  Main: [phone]  Fax: [phone]    Gastroenterology Consultation  Referring Provider:     [name], MD Primary Care Physician:  [name], MD Primary Gastroenterologist:  Dr. Cephas Hernandez Reason for Consultation:     Crohn's disease        HPI:   CARR SHARTZER is a 32 y.o. y/o male referred by Dr. Ouida Sills, [name], MD  for consultation & management of Crohn's disease. He has history of ileocolonic Crohn's diagnosed in 2007, detailed history described below is referred by [name] to establish care. He is currently on cymzia 400 mg every 2 weeks and Imuran 150 mg daily. Currently, he is experiencing nonbloody diarrhea up to 10 times a day associated with urgency and perianal purulent discharge from the fistula. He also reports tenderness in the right perianal area. He reports left-sided abdominal pain associated with nausea but no vomiting. He denies bloating, blood in the stools. He went to [facility] ER 2 days ago due to severe diarrhea and head is given prescription for oral prednisone but he did not filling the prescription yet. He denies fever, chills, loss of appetite, weight loss. He has been adherent with his medications.   Follow up visit 01/10/2017: Since last visit, I started him on entocort, cipro, cortenema for flare up of his symptoms. He gained about 5lbs since last visit. 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 Remicade process was started. He reports some rectal urgency and minimal rectal bleeding mostly on wiping. When I went over medications with him, he doesn't really know what he is taking and he did not bring his medication bottles with him even though I reminded him during last visit. He is not on entocort. He has seen endocrine and also hematology for iron deficiency anemia and scheduled for iron  infusion today  Follow-up visit 05/03/2017 He started on Remicade, completed induction. Stopped Imuran, started on Weekly methotrexate along with daily folate. He reports that he has been tolerating Remicade infusions very well. He gained weight. His stools are more formed, no recurrence of perianal fistulas. He denies rectal bleeding or bloody diarrhea. She recently had flareup of symptoms, was found to have norovirus as well as C. Difficile toxin positive when he went to emergency room on 04/30/2017. E also has significant leukocytosis. Underwent CT scan which did not show acute GI pathology. He was hospitalized for 1 day, discharged on oral vancomycin 125 mg 4 times a day. Patient is here for follow-up. He reports that his diarrhea is improving, currently having 4 semi-formed bowel movements daily without any blood. His appetite is improving.He reports being adherent to the medications. He denies any other complaints today. He reports that his energy levels are better, able to work longer hours.  Follow-up visit 07/20/2017 He went to [facility] ER 2 days ago due to one-week history of severe nonbloody diarrhea, fatigue, chills, nausea. He said he ate spaghetti with chicken and beef about a week ago, started experiencing several episodes of nonbloody diarrhea, particularly postprandial and sometimes at night. He also reports he may have had mild flareup of the perianal fistula drained and currently not draining much. In the ER, he had CBC and CMP which were unremarkable, GI pathogen panel and C. Difficile studies were negative. He is due for next dose of Remicade tomorrow. He  reports taking methotrexate weekly along with folate daily.  Follow up visit 09/09/2017 [name] has not been doing well since last visit. I gave him 4 weeks of prednisone course because of flareup of his GI symptoms including abdominal pain, bloating, urgency, loss of appetite, weight loss. He lost about 7 pounds  since last visit. Stool studies came back negative for infection. He reports taking methotrexate but missed last 2 doses as his fianc misplaced the medicine as they were cleaning up the house for sale. He is due for her next Remicade on August 8. He also reports perianal burning, discomfort, flare up of hemorrhoids, blood on wiping, diarrhea.  He went to ER 3 days ago due to ongoing symptoms. Repeat C. Difficile and GI pathogen panel came back negative  Crohn's disease classification:  Age: 51 to 1 Location: ileocolonic  Behavior: stricturing and penetrating  Perianal: Yes  IBD diagnosis:2007  Disease course: He was diagnosed in 2007, was experiencing right lower quadrant pain and bloating, was on oral mesalamine, several courses of prednisone, underwent first surgery in 2011 of transverse colon resection at [facility]. He continued to have diarrhea and required repeated courses of prednisone, established care at [facility] and underwent colonoscopy which revealed severe ileocolonic disease, underwent ileocolonic resection in 11/2011. He reports being started on Humira and Imuran since then and was doing well for some time. He was also taking marijuana and it significantly helped with his symptoms. Subsequently, he had frequent flareups, several hospitalizations and ER visits almost every month secondary to perianal fistula, abscess and bloody diarrhea. He was receiving short courses of antibiotics. He had I&D of right perirectal abscesses in May/2017. He does not recall having a seton placement. His colonoscopy in 03/2015 revealed mild active colitis and anastomotic stricture that was dilated with balloon to 12 mm. He underwent another colonoscopy in 12/2015 which revealed inflammation in the neoterminal ileum as well as colon and was found to have moderately active ileocolonic disease. He was then switched to [place] in 05/2016 and stayed on Imuran 150 mg daily. He was feeling better  overall but continued to have active perianal disease resulting in urgency and bloody stools. CT A/P from 06/2016 revealed right perirectal fistula with developing fluid collection concerning for abscess. MRE from 09/2016 revealed no evidence of active disease, however the fistula could not be visualized. His CRP improved from 10 in 06/2016 -1.7 in 09/2016. 09/2016 Trial of cipro+flagyl BID and rowasa enema resulted in significant improvement of symptoms. 11/2016 Return of fistula and rectal bleeding after tapering antibiotic. 12/2016 Restarted cipro, entocort, cortenema, decreased imuran to 58m. EGD and colonoscopy revealed gastric granuloma, Rutgeert's i4. Stool studies negative. Fecal calprotectin 370. C. Difficile infection in 04/2017, treated with oral vancomycin. flareup of Crohn's since 07/2017, took 4 weeks course of prednisone while on Remicade and methotrexate, mild improvement. Stool studies negative for infection including C. Difficile.   Extra intestinal manifestations: Imaging revealed chronic sacroiliitis. He denies skin, eye manifestations  IBD surgical history: 2011 : Intestinal resection, transverse colon Diagnosis:  TRANSVERSE COLON AND PROXIMAL TRANSVERSE COLON RESECTION:  SEGMENTS OF BOWEL WITH MODERATE TO FOCALLY SEVERE CHRONIC ACTIVE COLITIS.  EXTENSIVE TRANSMURAL INFLAMMATION.  CRYTPITIS AND CRYPT ABSCESSES ARE NOTED.  THE RESECTION MARGINS APPEAR CLEAR.  THERE IS NO EVIDENCE OF DYSPLASIA OR MALIGNANCY.  NINETEEN BENIGN LYMPH NODES. (0/19)  [facility] 9 cm TI, 5.5 cm long large bowel Diagnosis:  A:Ileum and colon, resection  -Segment  of ileum and colon with moderate to severely active chronic  enteritis, mildly active chronic colitis, and intact anastomosis at distal end  of specimen  -Surgical margins are viable; mildly active chronic colitis is present at  distal margin  -Apparent stricture near ileocecal valve    -Inflammatory polyp near ileocecal valve  -Fibrous obliteration of appendiceal lumen  -Six unremarkable lymph nodes examined microscopically  -No granulomas, viral cytopathic effect, or dysplasia identified   07/04/2015-incision and drainage of right perirectal abscess Imaging:  MRE-09/21/2016 IMPRESSION: 1. Stable surgical changes from prior colon resection and ileal colonic anastomosis. No findings for active Crohn's disease. 2. No acute abdominal/pelvic findings, mass lesions or adenopathy.  CT A/P 06/22/2016 IMPRESSION: 1. The fistula between the right lateral aspect of the rectum and the skin is again identified. The portion of the fistula immediately beneath the skin is more prominent in caliber in the interval with central decreased attenuation worrisome for a developing fluid collection in the distal fistula immediately beneath the cutaneous surface. A developing abscess cannot be excluded. Recommend clinical correlation. 2. Fluid throughout the remaining colon consistent with history of diarrhea. No colonic or small bowel inflammation identified. The rectum is poorly evaluated due to lack of distention. 3. 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  SBFT none  Procedures: Colonoscopy 09/2009 Diagnosis:  TRANSVERSE COLON MUCOSA COLD BIOPSY:  - MODERATE CHRONIC ACTIVE COLITIS, CONSISTENT WITH PATIENTS  HISTORY OF CROHNS DISEASE.  - NEGATIVE FOR DYSPLASIA AND MALIGNANCY.   Colonoscopy 06/2010 Diagnosis:  ULCERATED RIGHT SIDED COLON BIOPSY:  - MILD CHRONIC ACTIVE COLITIS, COMPATIBLE WITH PATIENTS KNOWN  HISTORY OF CROHNS DISEASE.  - NEGATIVE FOR DYSPLASIA AND MALIGNANCY.   Colonoscopies 07/2012 Diagnosis:  ANASTOMOTIC STRICTURE COLD BIOPSY:  - MILD CHRONIC ACTIVE COLITIS, COMPATIBLE WITH PATIENTS KNOWN  HISTORY OF CROHNS DISEASE.  - NEGATIVE FOR DYSPLASIA AND MALIGNANCY.   Colonoscopies 07/2013 Diagnosis:  ILEUM  BIOPSY:  - SMALL BOWEL MUCOSA WITH PRESERVED VILLOUS ARCHITECTURE.  - NEGATIVE FOR INTRAEPITHELIAL LYMPHOCYTOSIS, DYSPLASIA AND  MALIGNANCY.   Colonoscopy 03/2015 Multiple ulcers in the sigmoid colon, no bleeding was present biopsies were taken, tight stricture at the ileocolonic anastomosis unable to get through scope, dilated with 12 mm TTS balloon. The terminal ileum appeared normal DIAGNOSIS:  A. SIGMOID COLON; COLD BIOPSY:  - MILD CHRONIC ACTIVE COLITIS.  - NEGATIVE FOR DYSPLASIA AND MALIGNANCY.   Colonoscopy 12/2015 There was evidence of prior end-to-end ileocolonic anastomosis in the transverse colon. This was characterized by mild stenosis. The anastomosis was traversed. Scattered inflammation, moderate in severity and characterized by shallow ulcerations was found in the proximal ileum. Biopsies were taken with the cold forceps for histology. Inflammation characterized by shallow ulcerations were found no sites were spared. This was moderate in severity. Biopsies were taken. Perianal fistula found on perianal exam. Pathology: Small intestine-severe chronic active ileitis with ulceration. 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 - Patent functional end-to-end ileo-colonic anastomosis, characterized by mild stenosis. Dilated to 94m. - Ulcerated mucosa in the neo-terminal ileum. Biopsied. - Mucosal ulceration in colon. Biopsied. - Active ileocolonic Crohn's disease, Rutgeert's i4 on cimzia and imuran - The distal rectum and anal verge are normal on retroflexion view.  Upper Endoscopy  12/2016 - Normal second portion of the duodenum and third  portion of the duodenum. Biopsied. - Multiple non-bleeding duodenal ulcers with a clean ulcer base (Forrest Class III). Biopsied. - Normal stomach. Biopsied. - Normal gastroesophageal junction and esophagus.   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 Cimzia started in 05/28/2016 along with Imuran. Cimzia stopped in 01/2017. Remicade started in 02/2017, maintenance every 8 weeks Anti Integrins:  Ustekinumab: Tofactinib: Clinical trial:   Past Medical History:  Diagnosis Date  . Anxiety   . Asthma   . [place]   . Depression   . Rectal fistula     Past Surgical History:  Procedure Laterality Date  . COLON RESECTION    . COLON SURGERY    . COLONOSCOPY WITH PROPOFOL N/A 04/03/2015   Procedure: COLONOSCOPY WITH PROPOFOL;  Surgeon: PHulen Luster MD;  [facility]  Service: Endoscopy;  Laterality: N/A;  . COLONOSCOPY WITH PROPOFOL N/A 12/22/2015   Procedure: COLONOSCOPY WITH PROPOFOL;  Surgeon: DLucilla Lame MD;  [place]  Service: Endoscopy;  Laterality: N/A;  . COLONOSCOPY WITH PROPOFOL N/A 12/22/2016   Procedure: COLONOSCOPY WITH PROPOFOL;  Surgeon: VLin Landsman MD;  [place]  Service: Endoscopy;  Laterality: N/A;  . ESOPHAGOGASTRODUODENOSCOPY (EGD) WITH PROPOFOL N/A 12/22/2016   Procedure: ESOPHAGOGASTRODUODENOSCOPY (EGD) WITH PROPOFOL;  Surgeon: VLin Landsman MD;  [place]  Service:  Endoscopy;  Laterality: N/A;  . rectal fistula surgery    . WRIST SURGERY       Current Outpatient Medications:  .  benztropine (COGENTIN) 0.5 MG tablet, Take 1 tablet (0.5 mg total) by mouth at bedtime., Disp: 90 tablet, Rfl: 1 .  busPIRone (BUSPAR) 5 MG tablet, Take 1 tablet (5 mg total) by mouth 2 (two) times daily., Disp: 180 tablet, Rfl: 1 .  escitalopram (LEXAPRO) 10 MG tablet, Take 1 tablet (10 mg total) by mouth daily., Disp: 90 tablet, Rfl: 1 .  hydrOXYzine (VISTARIL) 25 MG capsule, Take 1 capsule (25 mg total) by mouth daily as needed (severe anxiety attacks)., Disp: 90 capsule, Rfl: 1 .  inFLIXimab (REMICADE) 100 MG injection, Start 574mkg IV on weeks 0,2,6 then continue every 8 weeks thereafter, Disp: 1 each, Rfl: 3 .  ARIPiprazole (ABILIFY) 5 MG tablet, Take 1 tablet (5 mg total) by mouth at bedtime. (Patient not taking: Reported on 09/09/2017), Disp: 90 tablet, Rfl: 1 .  budesonide (ENTOCORT EC) 3 MG 24 hr capsule, Take 9 mg by mouth daily. , Disp: , Rfl:  .  budesonide (ENTOCORT EC) 3 MG 24 hr capsule, Take 3 capsules (9 mg total) by mouth daily. (Patient not taking: Reported on 09/09/2017), Disp: 90 capsule, Rfl: 0 .  ciprofloxacin (CIPRO) 500 MG tablet, Take 1 tablet (500 mg total) by mouth 2 (two) times daily for 14 days., Disp: 28 tablet, Rfl: 0 .  diphenoxylate-atropine (LOMOTIL) 2.5-0.025 MG tablet, Take 1 tablet by mouth 4 (four) times daily as needed for diarrhea or loose stools. (Patient not taking: Reported on 09/09/2017), Disp:  30 tablet, Rfl: 1 .  folic acid (FOLVITE) 1 MG tablet, TAKE 1 TABLET BY MOUTH EVERY DAY. TAKE 2 TABLETS ON DAY WHEN YOU TAKE METHOTREXATE (Patient not taking: Reported on 09/09/2017), Disp: 90 tablet, Rfl: 0 .  methotrexate (RHEUMATREX) 2.5 MG tablet, Take 10 tablets by mouth once weekly for 12 weeks Caution:Chemotherapy. Protect from light. (Patient not taking: Reported on 09/09/2017), Disp: 120 tablet, Rfl: 0 .  metroNIDAZOLE (FLAGYL) 500 MG tablet, Take 1  tablet (500 mg total) by mouth 2 (two) times daily for 14 days., Disp: 28 tablet, Rfl: 0 .  ondansetron (ZOFRAN) 4 MG tablet, Take 1 tablet (4 mg total) by mouth every 8 (eight) hours as needed for nausea or vomiting. (Patient not taking: Reported on 09/09/2017), Disp: 30 tablet, Rfl: 1 .  potassium chloride SA (K-DUR,KLOR-CON) 20 MEQ tablet, Take 2 tablets (40 mEq total) by mouth daily for 5 days., Disp: 10 tablet, Rfl: 0 .  predniSONE (DELTASONE) 10 MG tablet, TAKE 4 TABLETS BY MOUTH DAILY WITH BREAKFAST (Patient not taking: Reported on 09/09/2017), Disp: 120 tablet, Rfl: 0   Family History  Problem Relation Age of Onset  . Diabetes Paternal Grandfather   . Heart disease Paternal Grandfather   . Depression Mother   . Drug abuse Mother   . Osteoporosis Neg Hx      Social History   Tobacco Use  . Smoking status: Never Smoker  . Smokeless tobacco: Former Network engineer Use Topics  . Alcohol use: Yes    Frequency: Never  . Drug use: No    Comment: hx of 3 year ago     Allergies as of 09/09/2017  . (No Known Allergies)    Review of Systems:    All systems reviewed and negative except where noted in HPI.   Physical Exam:  BP 112/75 (BP Location: Left Arm, Patient Position: Sitting, Cuff Size: Normal)   Pulse 100   Resp 17   Ht 5' 5"  (1.651 m)   Wt 130 lb 6.4 oz (59.1 kg)   BMI 21.70 kg/m  No LMP for male patient. Filed Weights   09/09/17 1058  Weight: 130 lb 6.4 oz (59.1 kg)   General:   Alert,  Thin bulit, pleasant and cooperative in NAD Head:  Normocephalic and atraumatic. Eyes:  Sclera clear, no icterus.   Conjunctiva pale. Ears:  Normal auditory acuity. Nose:  No deformity, discharge, or lesions. Mouth:  No deformity or lesions,oropharynx pink & moist. Neck:  Supple; no masses or thyromegaly. Lungs:  Respirations even and unlabored.  Clear throughout to auscultation.   No wheezes, crackles, or rhonchi. No acute distress. Heart:  Regular rate and rhythm; no murmurs,  clicks, rubs, or gallops. Abdomen:  Normal bowel sounds.  No bruits.  Soft, non-tender and mildly distended without masses, hepatosplenomegaly or hernias noted.  No guarding or rebound tenderness.  Midline vertical scar from prior surgery Rectal: perianal erythema, there is no active perianal fistula, scarring at the previous fistulous track, inflamed external hemorrhoid and skin tag Msk:  Symmetrical without gross deformities. Good, equal movement & strength bilaterally. Pulses:  Normal pulses noted. Extremities:  No clubbing or edema.  No cyanosis. Neurologic:  Alert and oriented x3;  grossly normal neurologically. Skin:  Intact without significant lesions or rashes. No jaundice. Psych:  Alert and cooperative. Normal mood and affect.   Assessment and Plan:   KENNETT SYMES is a 32 y.o. caucasian male with Ileocolonic Crohn's disease diagnosed in 2007, stricturing and  penetrating with perianal fistula, previously on Humira and Imuran, recurrence of postoperative Crohn's, resulted in discontinuation of Humira. Switched to [place] in 05/2016 biweekly and continued Imuran 150 mg daily which resulted in clinical remission followed by recurrence of luminal disease and perianal fistula that responded to antibiotics, then recurrence of symptoms after tapering abx, responded to entocort, EGD and colonoscopy 12/2016 revealed gastric crohn's and post op recurrence of Crohn's Rutgeert's i4, anastomotic stricture and status post dilation. Switched to Remicade and methotrexate. Currently in clinical remission, recent episode of C. Difficile being treated with oral vancomycin. He now has flare up of symptoms  Crohn's disease: 1. Continue Remicade, finished induction, currently on maintenance at 8 weeks interval. check Remicade antibodies and levels today as his next dose is due in 1 week. Will adjust the dose if he does not have antibodies and levels are low 2. continue methotrexate 90m po weekly, follow cbc  and LFTs every 320month up to date 3. Continue folic acid 53m48maily and 2mg5m day of methotrexate 4. Recommend 2 weeks course of Cipro and Flagyl 5. Recommend colonoscopy to assess response to Remicade 6. We will obtain information on his Remicade infusions to date  IBD Health Maintenance  1.TB status: PPD skin test negative as of October, 2017, quantiferon gold Negative, checked on 07/20/2017 2. Anemia: had iron deficiency anemia secondary to active Crohn's disease, resolved. Received parenteral iron infusions, 2, vitamin B12 is 397. Last ferritin level 114 in 07/2017 3.Immunizations: Hep A and B not immune, recommend Twinrix vaccine in our office at next clinic visit. Influenza-receives annual influenza vaccine, last received in October/2017, he is due for prevnar and pneumovax 4.Cancer screening I) Colon cancer/dysplasia surveillance: Last colonoscopy in 12/2016, no dysplasia II) Cervical cancer: N/A III) Skin cancer - counseled about annual skin exam by dermatology and skin protection in summer using sun screen SPF > 50, clothing 5.Bone health: Has osteoporosis, being followed by endocrine Vitamin D status: Normal as of 09/2016 Bone density testing: DEXA scan 09/29/2016 revealed osteoporosis, Z-score -3.3 5. Labs: every 3mon66monthSmoking: Never smoker 7. NSAIDs and Antibiotics use: No history of NSAID use  Follow up in 4 weeks     RohinCephas Hernandez

## 2017-09-10 ENCOUNTER — Inpatient Hospital Stay
Admission: EM | Admit: 2017-09-10 | Discharge: 2017-09-15 | DRG: 387 | Disposition: A | Discharge status: Home / Self Care | Payer: BC Managed Care – PPO | Attending: Internal Medicine | Admitting: Internal Medicine

## 2017-09-10 ENCOUNTER — Other Ambulatory Visit: Payer: Self-pay

## 2017-09-10 ENCOUNTER — Encounter: Payer: Self-pay | Admitting: Radiology

## 2017-09-10 ENCOUNTER — Emergency Department: Payer: BC Managed Care – PPO

## 2017-09-10 DIAGNOSIS — Z8249 Family history of ischemic heart disease and other diseases of the circulatory system: Secondary | ICD-10-CM | POA: Diagnosis not present

## 2017-09-10 DIAGNOSIS — K603 Anal fistula: Secondary | ICD-10-CM | POA: Diagnosis present

## 2017-09-10 DIAGNOSIS — K50813 Crohn's disease of both small and large intestine with fistula: Principal | ICD-10-CM | POA: Diagnosis present

## 2017-09-10 DIAGNOSIS — K50119 Crohn's disease of large intestine with unspecified complications: Secondary | ICD-10-CM

## 2017-09-10 DIAGNOSIS — K50012 Crohn's disease of small intestine with intestinal obstruction: Secondary | ICD-10-CM

## 2017-09-10 DIAGNOSIS — K50819 Crohn's disease of both small and large intestine with unspecified complications: Secondary | ICD-10-CM

## 2017-09-10 DIAGNOSIS — Z9049 Acquired absence of other specified parts of digestive tract: Secondary | ICD-10-CM

## 2017-09-10 DIAGNOSIS — K50912 Crohn's disease, unspecified, with intestinal obstruction: Secondary | ICD-10-CM | POA: Diagnosis not present

## 2017-09-10 DIAGNOSIS — F329 Major depressive disorder, single episode, unspecified: Secondary | ICD-10-CM | POA: Diagnosis present

## 2017-09-10 DIAGNOSIS — K5090 Crohn's disease, unspecified, without complications: Secondary | ICD-10-CM

## 2017-09-10 DIAGNOSIS — F419 Anxiety disorder, unspecified: Secondary | ICD-10-CM | POA: Diagnosis present

## 2017-09-10 DIAGNOSIS — K644 Residual hemorrhoidal skin tags: Secondary | ICD-10-CM | POA: Diagnosis present

## 2017-09-10 DIAGNOSIS — Z833 Family history of diabetes mellitus: Secondary | ICD-10-CM

## 2017-09-10 DIAGNOSIS — K50913 Crohn's disease, unspecified, with fistula: Secondary | ICD-10-CM | POA: Diagnosis not present

## 2017-09-10 HISTORY — DX: Crohn's disease, unspecified, without complications: K50.90

## 2017-09-10 LAB — COMPREHENSIVE METABOLIC PANEL
ALT: 16 U/L (ref 0–44)
AST: 16 U/L (ref 15–41)
Albumin: 3.7 g/dL (ref 3.5–5.0)
Alkaline Phosphatase: 76 U/L (ref 38–126)
Anion gap: 12 (ref 5–15)
BUN: 12 mg/dL (ref 6–20)
CO2: 30 mmol/L (ref 22–32)
Calcium: 9 mg/dL (ref 8.9–10.3)
Chloride: 92 mmol/L — ABNORMAL LOW (ref 98–111)
Creatinine, Ser: 1.17 mg/dL (ref 0.61–1.24)
GFR calc Af Amer: >60 mL/min (ref >60)
GFR calc non Af Amer: >60 mL/min (ref >60)
Glucose, Bld: 214 mg/dL — ABNORMAL HIGH (ref 70–99)
Potassium: 3.7 mmol/L (ref 3.5–5.1)
Sodium: 134 mmol/L — ABNORMAL LOW (ref 135–145)
Total Bilirubin: 2 mg/dL — ABNORMAL HIGH (ref 0.3–1.2)
Total Protein: 7.8 g/dL (ref 6.5–8.1)

## 2017-09-10 LAB — CBC
HCT: 47.2 % (ref 40.0–52.0)
Hemoglobin: 16.6 g/dL (ref 13.0–18.0)
MCH: 29.2 pg (ref 26.0–34.0)
MCHC: 35.2 g/dL (ref 32.0–36.0)
MCV: 83.1 fL (ref 80.0–100.0)
Platelets: 329 10*3/uL (ref 150–440)
RBC: 5.68 MIL/uL (ref 4.40–5.90)
RDW: 13.4 % (ref 11.5–14.5)
WBC: 13.1 10*3/uL — ABNORMAL HIGH (ref 3.8–10.6)

## 2017-09-10 LAB — LIPASE, BLOOD: Lipase: 31 U/L (ref 11–51)

## 2017-09-10 MED ORDER — ONDANSETRON HCL 4 MG/2ML IJ SOLN
4.0000 mg | Freq: Once | INTRAMUSCULAR | Status: AC
  Administered 2017-09-10: 4 mg via INTRAVENOUS
  Filled 2017-09-10: qty 2

## 2017-09-10 MED ORDER — HYDROCORTISONE NA SUCCINATE PF 100 MG IJ SOLR
100.0000 mg | Freq: Once | INTRAMUSCULAR | Status: AC
  Administered 2017-09-10: 100 mg via INTRAVENOUS
  Filled 2017-09-10: qty 2

## 2017-09-10 MED ORDER — IOHEXOL 300 MG/ML  SOLN
100.0000 mL | Freq: Once | INTRAMUSCULAR | Status: AC | PRN
  Administered 2017-09-10: 100 mL via INTRAVENOUS

## 2017-09-10 MED ORDER — MORPHINE SULFATE (PF) 4 MG/ML IV SOLN
4.0000 mg | Freq: Once | INTRAVENOUS | Status: AC
  Administered 2017-09-10: 4 mg via INTRAVENOUS
  Filled 2017-09-10: qty 1

## 2017-09-10 MED ORDER — SODIUM CHLORIDE 0.9 % IV BOLUS
1000.0000 mL | Freq: Once | INTRAVENOUS | Status: AC
  Administered 2017-09-10: 1000 mL via INTRAVENOUS

## 2017-09-10 NOTE — ED Notes (Signed)
Patient transported to CT 

## 2017-09-10 NOTE — H&P (Signed)
[facility] [place] NAME: Gregg Winchell    MR#:  [id number]  DATE OF BIRTH:  1985-06-30  DATE OF ADMISSION:  09/10/2017  PRIMARY CARE PHYSICIAN: [name], MD   REQUESTING/REFERRING PHYSICIAN: Dr. Clearnce Hasten.  CHIEF COMPLAINT:   Chief Complaint  Patient presents with  . Abdominal Pain   Abdominal pain, nausea, vomiting and bloody diarrhea for several weeks. HISTORY OF PRESENT ILLNESS:  Leroy Hernandez  is a 32 y.o. male with a known history of Crohn's disease, rectal fistula, asthma, anxiety and depression.  The patient presented to the ED with above chief complaints.  Abdominal pain is in the middle part of the abdomen, tenderness without radiation.  He also complains of nausea, vomiting several times and diarrhea with bloody stool.  CAT scan of abdomen and pelvis shows New inflammatory changes involving a colonic anastomosis in the right mid abdomen in this patient status post right hemicolectomy.  Dr. Vicente Males suggest admit patient and start IV hydrocortisone.  PAST MEDICAL HISTORY:   Past Medical History:  Diagnosis Date  . Anxiety   . Asthma   . Crohn's disease ([place])   . Depression   . Rectal fistula     PAST SURGICAL HISTORY:   Past Surgical History:  Procedure Laterality Date  . COLON RESECTION    . COLON SURGERY    . COLONOSCOPY WITH PROPOFOL N/A 04/03/2015   Procedure: COLONOSCOPY WITH PROPOFOL;  Surgeon: Hulen Luster, MD;  [facility] ENDOSCOPY;  Service: Endoscopy;  Laterality: N/A;  . COLONOSCOPY WITH PROPOFOL N/A 12/22/2015   Procedure: COLONOSCOPY WITH PROPOFOL;  Surgeon: [name], MD;  Location: [place];  Service: Endoscopy;  Laterality: N/A;  . COLONOSCOPY WITH PROPOFOL N/A 12/22/2016   Procedure: COLONOSCOPY WITH PROPOFOL;  Surgeon: Lin Landsman, MD;  Location: [place];  Service: Endoscopy;  Laterality: N/A;  . ESOPHAGOGASTRODUODENOSCOPY (EGD) WITH PROPOFOL N/A 12/22/2016   Procedure: ESOPHAGOGASTRODUODENOSCOPY (EGD) WITH PROPOFOL;  Surgeon: Lin Landsman, MD;  Location: [place];  Service: Endoscopy;  Laterality: N/A;  . rectal fistula surgery    . WRIST SURGERY      SOCIAL HISTORY:   Social History   Tobacco Use  . Smoking status: Never Smoker  . Smokeless tobacco: Former Network engineer Use Topics  . Alcohol use: Yes    Frequency: Never    FAMILY HISTORY:   Family History  Problem Relation Age of Onset  . Diabetes Paternal Grandfather   . Heart disease Paternal Grandfather   . Depression Mother   . Drug abuse Mother   . Osteoporosis Neg Hx     DRUG ALLERGIES:  No Known Allergies  REVIEW OF SYSTEMS:   Review of Systems  Constitutional: Negative for chills, fever and malaise/fatigue.  HENT: Negative for sore throat.   Eyes: Negative for blurred vision and double vision.  Respiratory: Negative for cough, hemoptysis, shortness of breath, wheezing and stridor.   Cardiovascular: Negative for chest pain, palpitations, orthopnea and leg swelling.  Gastrointestinal: Positive for abdominal pain, blood in stool, diarrhea, nausea and vomiting. Negative for melena.  Genitourinary: Negative for dysuria, flank pain and hematuria.  Musculoskeletal: Negative for back pain and joint pain.  Skin: Negative for rash.  Neurological: Negative for dizziness, sensory change, focal weakness, seizures, loss of consciousness, weakness and headaches.  Endo/Heme/Allergies: Negative for polydipsia.  Psychiatric/Behavioral: Negative for depression. The patient is not nervous/anxious.     MEDICATIONS AT HOME:  Prior to Admission medications   Medication Sig Start Date End Date Taking? Authorizing Provider  benztropine (COGENTIN) 0.5 MG tablet Take 1 tablet (0.5 mg total) by mouth at bedtime. 07/15/17  Yes Eappen, Ria Clock, MD  busPIRone (BUSPAR) 5 MG tablet Take 1 tablet (5 mg total) by mouth 2 (two) times daily. 07/15/17  Yes Ursula Alert, MD    ciprofloxacin (CIPRO) 500 MG tablet Take 1 tablet (500 mg total) by mouth 2 (two) times daily for 14 days. 09/09/17 09/23/17 Yes Vanga, Tally Due, MD  escitalopram (LEXAPRO) 10 MG tablet Take 1 tablet (10 mg total) by mouth daily. 07/15/17  Yes Ursula Alert, MD  hydrOXYzine (VISTARIL) 25 MG capsule Take 1 capsule (25 mg total) by mouth daily as needed (severe anxiety attacks). 07/15/17  Yes Ursula Alert, MD  inFLIXimab (REMICADE) 100 MG injection Start 24m/kg IV on weeks 0,2,6 then continue every 8 weeks thereafter 01/10/17  Yes Vanga, RTally Due MD  metroNIDAZOLE (FLAGYL) 500 MG tablet Take 1 tablet (500 mg total) by mouth 2 (two) times daily for 14 days. 09/09/17 09/23/17 Yes Vanga, RTally Due MD  ARIPiprazole (ABILIFY) 5 MG tablet Take 1 tablet (5 mg total) by mouth at bedtime. Patient not taking: Reported on 09/09/2017 07/15/17   EUrsula Alert MD  budesonide (ENTOCORT EC) 3 MG 24 hr capsule Take 3 capsules (9 mg total) by mouth daily. Patient not taking: Reported on 09/09/2017 07/20/17   VLin Landsman MD  diphenoxylate-atropine (LOMOTIL) 2.5-0.025 MG tablet Take 1 tablet by mouth 4 (four) times daily as needed for diarrhea or loose stools. Patient not taking: Reported on 09/09/2017 09/06/17 09/06/18  WEarleen Newport MD  folic acid (FOLVITE) 1 MG tablet TAKE 1 TABLET BY MOUTH EVERY DAY. TAKE 2 TABLETS ON DAY WHEN YOU TAKE METHOTREXATE Patient not taking: Reported on 09/09/2017 06/14/17   VLin Landsman MD  methotrexate (RHEUMATREX) 2.5 MG tablet Take 10 tablets by mouth once weekly for 12 weeks Caution:Chemotherapy. Protect from light. Patient not taking: Reported on 09/09/2017 03/11/17   VLin Landsman MD  ondansetron (ZOFRAN) 4 MG tablet Take 1 tablet (4 mg total) by mouth every 8 (eight) hours as needed for nausea or vomiting. Patient not taking: Reported on 09/09/2017 07/20/17   VLin Landsman MD  potassium chloride SA (K-DUR,KLOR-CON) 20 MEQ tablet Take 2 tablets (40 mEq  total) by mouth daily for 5 days. 07/22/17 07/27/17  VLin Landsman MD  predniSONE (DELTASONE) 10 MG tablet TAKE 4 TABLETS BY MOUTH DAILY WITH BREAKFAST Patient not taking: Reported on 09/09/2017 08/30/17   VLin Landsman MD      VITAL SIGNS:  Blood pressure 120/79, pulse (!) 126, temperature 98.4 F (36.9 C), temperature source Oral, resp. rate 20, SpO2 97 %.  PHYSICAL EXAMINATION:  Physical Exam  GENERAL:  32y.o.-year-old patient lying in the bed with no acute distress.  EYES: Pupils equal, round, reactive to light and accommodation. No scleral icterus. Extraocular muscles intact.  HEENT: Head atraumatic, normocephalic. Oropharynx and nasopharynx clear.  NECK:  Supple, no jugular venous distention. No thyroid enlargement, no tenderness.  LUNGS: Normal breath sounds bilaterally, no wheezing, rales,rhonchi or crepitation. No use of accessory muscles of respiration.  CARDIOVASCULAR: S1, S2 normal. No murmurs, rubs, or gallops.  ABDOMEN: Soft, nontender, nondistended. Bowel sounds present. No organomegaly or mass.  EXTREMITIES: No pedal edema, cyanosis, or clubbing.  NEUROLOGIC: Cranial nerves II through XII are intact. Muscle strength 5/5 in all extremities. Sensation intact. Gait not checked.  PSYCHIATRIC: The patient is alert and oriented x 3.  SKIN: No obvious rash, lesion, or ulcer.   LABORATORY PANEL:   CBC Recent Labs  Lab 09/10/17 1649  WBC 13.1*  HGB 16.6  HCT 47.2  PLT 329   ------------------------------------------------------------------------------------------------------------------  Chemistries  Recent Labs  Lab 09/10/17 1649  NA 134*  K 3.7  CL 92*  CO2 30  GLUCOSE 214*  BUN 12  CREATININE 1.17  CALCIUM 9.0  AST 16  ALT 16  ALKPHOS 76  BILITOT 2.0*   ------------------------------------------------------------------------------------------------------------------  Cardiac Enzymes No results for input(s): TROPONINI in the last 168  hours. ------------------------------------------------------------------------------------------------------------------  RADIOLOGY:  Ct Abdomen Pelvis W Contrast  Result Date: 09/10/2017 CLINICAL DATA:  Diarrhea, vomiting, abdominal pain, possible rectal fistula. Crohn's disease. EXAM: CT ABDOMEN AND PELVIS WITH CONTRAST TECHNIQUE: Multidetector CT imaging of the abdomen and pelvis was performed using the standard protocol following bolus administration of intravenous contrast. CONTRAST:  152m OMNIPAQUE IOHEXOL 300 MG/ML  SOLN COMPARISON:  04/30/2017 FINDINGS: Lower chest: Lung bases are clear. Hepatobiliary: Liver is within normal limits. 5 mm layering gallstone (series 2/image 28), without associated inflammatory changes. No intrahepatic or extrahepatic ductal dilatation. Pancreas: Within normal limits. Spleen: Within normal limits. Adrenals/Urinary Tract: Adrenal glands are within normal limits. Kidneys are within normal limits.  No hydronephrosis. Bladder is within normal limits. Stomach/Bowel: Stomach is within normal limits. Status post right hemicolectomy. Colonic suture line in the right mid abdomen. Associated new pericolic inflammatory changes at the suture line (series 2/image 32). Rectal wall thickening. Associated anorectal fistula at the 7 o'clock position (series 2/image 73) with fistulous tract extending into the right gluteal region (series 2/image 78), chronic. No associated perirectal abscess. Vascular/Lymphatic: No evidence of abdominal aortic aneurysm. Reactive nodes along the right mid abdominal mesentery measuring up to 11 mm (series 2/image 38), grossly unchanged. Reproductive: Prostate is unremarkable. Other: No abdominopelvic ascites.  No free air. Musculoskeletal: Visualized osseous structures are within normal limits. IMPRESSION: New inflammatory changes involving a colonic anastomosis in the right mid abdomen in this patient status post right hemicolectomy. Given the clinical  history, this presumably reflects active inflammatory Crohn's disease. 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 No associated perirectal abscess. Cholelithiasis, without associated inflammatory changes. Electronically Signed   By: SJulian HyM.D.   On: 09/10/2017 21:46      IMPRESSION AND PLAN:   Crohn's disease exacerbation The patient will be admitted to medical floor. Continue hydrocortisone IV every 12 hours, follow-up GI consult. Pain control.  Zofran as needed.  Clear liquid diet.  Leukocytosis, possible due to above.  Follow-up CBC.  Elevated bilirubin.  Unclear etiology, follow-up with GI.  Cholelithiasis.  Stable.  Asthma.  Stable.  Nebulizer as needed.  All the records are reviewed and case discussed with ED provider. Management plans discussed with the patient, family and they are in agreement.  CODE STATUS: Full code.  TOTAL TIME TAKING CARE OF THIS PATIENT: 42 minutes.    QDemetrios LollM.D on 09/10/2017 at 10:50 PM  Between 7am to 6pm - Pager - [phone]  After 6pm go to [email or web address] - pTechnical brewerAlam[facility] Hospitalists  Office  [phone] CC: Primary care physician; AKirk Ruths MD   Note: This dictation was prepared with Dragon dictation along with smaller phrase technology. Any transcriptional errors that result from this process are unin

## 2017-09-10 NOTE — ED Notes (Addendum)
ED Provider at bedside.  Pt reports recent hx of diarrhea over the last 5 days and now regular vomiting, unable to retain fluids,   Pain at mid abdomen 3-10 soreness -tender to touch, 7/10 at rectum,   Fistula at right side of rectum, no drainage, for the the last 8 years "because of my Crohns"  Pt reports foods causes bloating then nausea, reports bright red blood in stool for the last 2 weeks

## 2017-09-10 NOTE — ED Provider Notes (Signed)
[facility] Emergency Department Provider Note ____________________________________________   First MD Initiated Contact with Patient 09/10/17 2041     (approximate)  I have reviewed the triage vital signs and the nursing notes.   HISTORY  Chief Complaint Abdominal Pain  HPI Leroy Hernandez is a 32 y.o. male history of Crohn's disease and rectal fistula as well as a bowel resection on Remicade who is presenting to the emergency department today with persistent abdominal pain as well as diarrhea.  He says that he is having blood mixed in with the stool.  Says that he also has burning in his rectum.  Says that as of today he is also vomiting.  Was seen here in this emergency department on 30 July and had a negative C. difficile study.  At that time his white blood cell count was also normal.   Past Medical History:  Diagnosis Date  . Anxiety   . Asthma   . Crohn's disease ([place])   . Depression   . Rectal fistula     Patient Active Problem List   Diagnosis Date Noted  . H/O Clostridium difficile infection 05/03/2017  . Gastroenteritis due to norovirus 05/03/2017  . Iron deficiency anemia 12/28/2016  . Osteoporosis 12/09/2016  . Generalized abdominal pain   . Perirectal fistula   . Sepsis ([place]) 06/22/2016  . Fracture of metacarpal bone 06/09/2016  . Calculus of gallbladder without cholecystitis without obstruction 12/25/2015  . Gallbladder polyp 12/25/2015  . Blood in stool   . Intestinal bypass or anastomosis status   . Crohn's disease of both small and large intestine with rectal bleeding ([place])   . Perirectal abscess 07/04/2015  . Mild episode of recurrent major depressive disorder ([place]) 04/02/2015  . Asthma, mild intermittent 04/02/2015    Past Surgical History:  Procedure Laterality Date  . COLON RESECTION    . COLON SURGERY    . COLONOSCOPY WITH PROPOFOL N/A 04/03/2015   Procedure: COLONOSCOPY WITH PROPOFOL;  Surgeon: Hulen Luster, MD;   [facility] ENDOSCOPY;  Service: Endoscopy;  Laterality: N/A;  . COLONOSCOPY WITH PROPOFOL N/A 12/22/2015   Procedure: COLONOSCOPY WITH PROPOFOL;  Surgeon: [name], MD;  Location: [place];  Service: Endoscopy;  Laterality: N/A;  . COLONOSCOPY WITH PROPOFOL N/A 12/22/2016   Procedure: COLONOSCOPY WITH PROPOFOL;  Surgeon: Lin Landsman, MD;  Location: [place];  Service: Endoscopy;  Laterality: N/A;  . ESOPHAGOGASTRODUODENOSCOPY (EGD) WITH PROPOFOL N/A 12/22/2016   Procedure: ESOPHAGOGASTRODUODENOSCOPY (EGD) WITH PROPOFOL;  Surgeon: Lin Landsman, MD;  Location: [place];  Service: Endoscopy;  Laterality: N/A;  . rectal fistula surgery    . WRIST SURGERY      Prior to Admission medications   Medication Sig Start Date End Date Taking? Authorizing Provider  ARIPiprazole (ABILIFY) 5 MG tablet Take 1 tablet (5 mg total) by mouth at bedtime. Patient not taking: Reported on 09/09/2017 07/15/17   Ursula Alert, MD  benztropine (COGENTIN) 0.5 MG tablet Take 1 tablet (0.5 mg total) by mouth at bedtime. 07/15/17   Ursula Alert, MD  budesonide (ENTOCORT EC) 3 MG 24 hr capsule Take 9 mg by mouth daily.     [provider]  budesonide (ENTOCORT EC) 3 MG 24 hr capsule Take 3 capsules (9 mg total) by mouth daily. Patient not taking: Reported on 09/09/2017 07/20/17   Lin Landsman, MD  busPIRone (BUSPAR) 5 MG tablet Take 1 tablet (5 mg total) by mouth 2 (two) times daily.  07/15/17   Ursula Alert, MD  ciprofloxacin (CIPRO) 500 MG tablet Take 1 tablet (500 mg total) by mouth 2 (two) times daily for 14 days. 09/09/17 09/23/17  Lin Landsman, MD  diphenoxylate-atropine (LOMOTIL) 2.5-0.025 MG tablet Take 1 tablet by mouth 4 (four) times daily as needed for diarrhea or loose stools. Patient not taking: Reported on 09/09/2017 09/06/17 09/06/18  Earleen Newport, MD  escitalopram (LEXAPRO) 10 MG tablet Take 1 tablet (10 mg total) by mouth daily. 07/15/17    Ursula Alert, MD  folic acid (FOLVITE) 1 MG tablet TAKE 1 TABLET BY MOUTH EVERY DAY. TAKE 2 TABLETS ON DAY WHEN YOU TAKE METHOTREXATE Patient not taking: Reported on 09/09/2017 06/14/17   Lin Landsman, MD  hydrOXYzine (VISTARIL) 25 MG capsule Take 1 capsule (25 mg total) by mouth daily as needed (severe anxiety attacks). 07/15/17   Ursula Alert, MD  inFLIXimab (REMICADE) 100 MG injection Start 67m/kg IV on weeks 0,2,6 then continue every 8 weeks thereafter 01/10/17   VLin Landsman MD  methotrexate (RHEUMATREX) 2.5 MG tablet Take 10 tablets by mouth once weekly for 12 weeks Caution:Chemotherapy. Protect from light. Patient not taking: Reported on 09/09/2017 03/11/17   VLin Landsman MD  metroNIDAZOLE (FLAGYL) 500 MG tablet Take 1 tablet (500 mg total) by mouth 2 (two) times daily for 14 days. 09/09/17 09/23/17  VLin Landsman MD  ondansetron (ZOFRAN) 4 MG tablet Take 1 tablet (4 mg total) by mouth every 8 (eight) hours as needed for nausea or vomiting. Patient not taking: Reported on 09/09/2017 07/20/17   VLin Landsman MD  potassium chloride SA (K-DUR,KLOR-CON) 20 MEQ tablet Take 2 tablets (40 mEq total) by mouth daily for 5 days. 07/22/17 07/27/17  VLin Landsman MD  predniSONE (DELTASONE) 10 MG tablet TAKE 4 TABLETS BY MOUTH DAILY WITH BREAKFAST Patient not taking: Reported on 09/09/2017 08/30/17   VLin Landsman MD    Allergies Patient has no known allergies.  Family History  Problem Relation Age of Onset  . Diabetes Paternal Grandfather   . Heart disease Paternal Grandfather   . Depression Mother   . Drug abuse Mother   . Osteoporosis Neg Hx     Social History Social History   Tobacco Use  . Smoking status: Never Smoker  . Smokeless tobacco: Former UNetwork engineerUse Topics  . Alcohol use: Yes    Frequency: Never  . Drug use: No    Comment: hx of 3 year ago     Review of Systems  Constitutional: No fever/chills Eyes: No visual  changes. ENT: No sore throat. Cardiovascular: Denies chest pain. Respiratory: Denies shortness of breath. Gastrointestinal:   No constipation. Genitourinary: Negative for dysuria. Musculoskeletal: Negative for back pain. Skin: Negative for rash. Neurological: Negative for headaches, focal weakness or numbness.   ____________________________________________   PHYSICAL EXAM:  VITAL SIGNS: ED Triage Vitals [09/10/17 1642]  Enc Vitals Group     BP 136/89     Pulse Rate (!) 130     Resp 18     Temp 98.4 F (36.9 C)     Temp Source Oral     SpO2      Weight      Height      Head Circumference      Peak Flow      Pain Score 6     Pain Loc      Pain Edu?      [place] in [place]  Constitutional: Alert and oriented. Well appearing and in no acute distress. Eyes: Conjunctivae are normal.  Head: Atraumatic. Nose: No congestion/rhinnorhea. Mouth/Throat: Mucous membranes are moist.  Neck: No stridor.   Cardiovascular: Normal rate, regular rhythm. Grossly normal heart sounds.   Respiratory: Normal respiratory effort.  No retractions. Lungs CTAB. Gastrointestinal: Soft with minimal left upper quadrant tenderness to palpation without rebound or guarding. No distention.   External rectal exam with a small amount of erythema extending out from the rectum with a diameter of about 2 cm.  No active drainage.  No obvious fistulas.  No blood on external exam.  Musculoskeletal: No lower extremity tenderness nor edema.  No joint effusions. Neurologic:  Normal speech and language. No gross focal neurologic deficits are appreciated. Skin:  Skin is warm, dry and intact. No rash noted. Psychiatric: Mood and affect are normal. Speech and behavior are normal.  ____________________________________________   LABS (all labs ordered are listed, but only abnormal results are displayed)  Labs Reviewed  COMPREHENSIVE METABOLIC PANEL - Abnormal; Notable for the following components:      Result  Value   Sodium 134 (*)    Chloride 92 (*)    Glucose, Bld 214 (*)    Total Bilirubin 2.0 (*)    All other components within normal limits  CBC - Abnormal; Notable for the following components:   WBC 13.1 (*)    All other components within normal limits  LIPASE, BLOOD  URINALYSIS, COMPLETE (UACMP) WITH MICROSCOPIC   ____________________________________________  EKG   ____________________________________________  RADIOLOGY  CT with inflammatory changes consistent with Crohn's disease.  No discernible obstruction. ____________________________________________   PROCEDURES  Procedure(s) performed:   Procedures  Critical Care performed:   ____________________________________________   INITIAL IMPRESSION / ASSESSMENT AND PLAN / ED COURSE  Pertinent labs & imaging results that were available during my care of the patient were reviewed by me and considered in my medical decision making (see chart for details).  Differential diagnosis includes, but is not limited to, acute appendicitis, renal colic, testicular torsion, urinary tract infection/pyelonephritis, prostatitis,  epididymitis, diverticulitis, small bowel obstruction or ileus, colitis, abdominal aortic aneurysm, gastroenteritis, hernia, etc. Crohn's flare As part of my medical decision making, I reviewed the following data within the electronic MEDICAL RECORD NUMBER Notes from prior ED visits  ----------------------------------------- 9:11 PM on 09/10/2017 -----------------------------------------  Discussed the case with Dr. Vicente Males of GI who recommends giving the patient 100 mg of hydrocortisone and obtaining a CAT scan.  Recommends admitting the patient and having [name] see the patient in the morning.  ----------------------------------------- 9:59 PM on 09/10/2017 -----------------------------------------  No vomiting in the emergency department.  Patient updated as CAT scan results.  Will be admitted overnight.   Signed out to Dr. [name]. ____________________________________________   FINAL CLINICAL IMPRESSION(S) / ED DIAGNOSES  Crohn's colitis  NEW MEDICATIONS STARTED DURING THIS VISIT:  New Prescriptions   No medications on file     Note:  This document was prepared using Dragon voice recognition software and may include unintentional dictation errors.     Orbie Pyo, MD 09/10/17 2200

## 2017-09-10 NOTE — ED Triage Notes (Signed)
Patient reports he was here couple days ago for abdominal pain and nausea.  States "I think I need to be hospitalized".

## 2017-09-11 DIAGNOSIS — K50119 Crohn's disease of large intestine with unspecified complications: Secondary | ICD-10-CM

## 2017-09-11 LAB — CBC
HCT: 36.7 % — ABNORMAL LOW (ref 40.0–52.0)
Hemoglobin: 12.9 g/dL — ABNORMAL LOW (ref 13.0–18.0)
MCH: 29.2 pg (ref 26.0–34.0)
MCHC: 35.2 g/dL (ref 32.0–36.0)
MCV: 83 fL (ref 80.0–100.0)
PLATELETS: 234 10*3/uL (ref 150–440)
RBC: 4.42 MIL/uL (ref 4.40–5.90)
RDW: 13 % (ref 11.5–14.5)
WBC: 4.4 10*3/uL (ref 3.8–10.6)

## 2017-09-11 LAB — BASIC METABOLIC PANEL
Anion gap: 7 (ref 5–15)
BUN: 10 mg/dL (ref 6–20)
CHLORIDE: 101 mmol/L (ref 98–111)
CO2: 27 mmol/L (ref 22–32)
CREATININE: 0.9 mg/dL (ref 0.61–1.24)
Calcium: 7.5 mg/dL — ABNORMAL LOW (ref 8.9–10.3)
GFR calc Af Amer: >60 mL/min (ref >60)
GFR calc non Af Amer: >60 mL/min (ref >60)
GLUCOSE: 131 mg/dL — AB (ref 70–99)
Potassium: 3.8 mmol/L (ref 3.5–5.1)
Sodium: 135 mmol/L (ref 135–145)

## 2017-09-11 MED ORDER — MORPHINE SULFATE (PF) 4 MG/ML IV SOLN
4.0000 mg | INTRAVENOUS | Status: DC | PRN
  Administered 2017-09-11 – 2017-09-15 (×9): 4 mg via INTRAVENOUS
  Filled 2017-09-11 (×9): qty 1

## 2017-09-11 MED ORDER — HYDROCORTISONE NA SUCCINATE PF 100 MG IJ SOLR
100.0000 mg | Freq: Two times a day (BID) | INTRAMUSCULAR | Status: DC
  Administered 2017-09-11 (×3): 100 mg via INTRAVENOUS
  Filled 2017-09-11 (×3): qty 2

## 2017-09-11 MED ORDER — ESCITALOPRAM OXALATE 10 MG PO TABS
10.0000 mg | ORAL_TABLET | Freq: Every day | ORAL | Status: DC
  Administered 2017-09-11 – 2017-09-15 (×4): 10 mg via ORAL
  Filled 2017-09-11 (×5): qty 1

## 2017-09-11 MED ORDER — HYDROCODONE-ACETAMINOPHEN 5-325 MG PO TABS
1.0000 | ORAL_TABLET | ORAL | Status: DC | PRN
  Administered 2017-09-11 – 2017-09-15 (×9): 2 via ORAL
  Administered 2017-09-15: 1 via ORAL
  Filled 2017-09-11 (×9): qty 2
  Filled 2017-09-11: qty 1
  Filled 2017-09-11 (×2): qty 2

## 2017-09-11 MED ORDER — BUSPIRONE HCL 10 MG PO TABS
5.0000 mg | ORAL_TABLET | Freq: Two times a day (BID) | ORAL | Status: DC
  Administered 2017-09-11 – 2017-09-15 (×9): 5 mg via ORAL
  Filled 2017-09-11 (×3): qty 0.5
  Filled 2017-09-11: qty 1
  Filled 2017-09-11: qty 0.5
  Filled 2017-09-11 (×2): qty 1
  Filled 2017-09-11 (×2): qty 0.5
  Filled 2017-09-11 (×2): qty 1
  Filled 2017-09-11 (×2): qty 0.5

## 2017-09-11 MED ORDER — HYDROXYZINE PAMOATE 25 MG PO CAPS
25.0000 mg | ORAL_CAPSULE | Freq: Every day | ORAL | Status: DC | PRN
  Filled 2017-09-11: qty 1

## 2017-09-11 MED ORDER — SODIUM CHLORIDE 0.9 % IV SOLN
INTRAVENOUS | Status: DC
  Administered 2017-09-11 – 2017-09-12 (×4): via INTRAVENOUS

## 2017-09-11 MED ORDER — ALBUTEROL SULFATE (2.5 MG/3ML) 0.083% IN NEBU: 2.5000 mg | INHALATION_SOLUTION | RESPIRATORY_TRACT | Status: DC | PRN

## 2017-09-11 MED ORDER — ONDANSETRON HCL 4 MG PO TABS: 4.0000 mg | ORAL_TABLET | Freq: Four times a day (QID) | ORAL | Status: DC | PRN

## 2017-09-11 MED ORDER — HYDROXYZINE HCL 25 MG PO TABS: 25.0000 mg | ORAL_TABLET | Freq: Every day | ORAL | Status: DC | PRN

## 2017-09-11 MED ORDER — ACETAMINOPHEN 650 MG RE SUPP: 650.0000 mg | Freq: Four times a day (QID) | RECTAL | Status: DC | PRN

## 2017-09-11 MED ORDER — BENZTROPINE MESYLATE 0.5 MG PO TABS
0.5000 mg | ORAL_TABLET | Freq: Every day | ORAL | Status: DC
Start: 2017-09-11 — End: 2017-09-15
  Administered 2017-09-11 – 2017-09-14 (×5): 0.5 mg via ORAL
  Filled 2017-09-11 (×6): qty 1

## 2017-09-11 MED ORDER — ONDANSETRON HCL 4 MG/2ML IJ SOLN: 4.0000 mg | Freq: Four times a day (QID) | INTRAMUSCULAR | Status: DC | PRN

## 2017-09-11 MED ORDER — ACETAMINOPHEN 325 MG PO TABS: 650.0000 mg | ORAL_TABLET | Freq: Four times a day (QID) | ORAL | Status: DC | PRN

## 2017-09-11 NOTE — Consult Note (Signed)
Leroy Lame, MD [facility]  [address], [place], [place] [address] Phone: [phone] Fax : [phone]  Consultation  Referring Provider:     Dr. [name] Primary Care Physician:  [name], MD Primary Gastroenterologist:  Dr. [name]         Reason for Consultation:     Crohn's flare  Date of Admission:  09/10/2017 Date of Consultation:  09/11/2017         HPI:   Leroy Hernandez is a 32 y.o. male who is well-known to me from past admissions and office follow-up.  The patient has been followed most recently by Dr. [name] for his inflammatory bowel disease.  The patient was taking Cimzia every 2 weeks and Imuran 150 mg a day.  The patient was having continued nonbloody diarrhea associated with urgency.  Patient now has been on Remicade but reports he is not due for his next Remicade infusion until next week.  The patient also reports that he has been feeling ill for the last 3 weeks.  The patient had called to Dr. [name] and was started on steroids.  He is also on methotrexate 25 mg weekly.  He was prescribed Entocort 3 mg 3 times daily for flares and was set up for a colonoscopy. The patient states that he had a lot of abdominal pain from what he reported to be his inflamed colon and was not doing well so he came to the emergency room.  The patient was started on steroids and states he is feeling better today.  There is no report of any nausea or vomiting.  The patient also has a history of a rectal fistula asthma anxiety and depression. The patient reported the abdominal pain to the in the middle part of his abdomen without radiation.  Past Medical History:  Diagnosis Date  . Anxiety   . Asthma   . Crohn's disease ([facility])   . Depression   . Rectal fistula     Past Surgical History:  Procedure Laterality Date  . COLON RESECTION    . COLON SURGERY    . COLONOSCOPY WITH PROPOFOL N/A 04/03/2015   Procedure: COLONOSCOPY WITH PROPOFOL;  Surgeon: Hulen Luster, MD;  Location: [facility]  ENDOSCOPY;  Service: Endoscopy;  Laterality: N/A;  . COLONOSCOPY WITH PROPOFOL N/A 12/22/2015   Procedure: COLONOSCOPY WITH PROPOFOL;  Surgeon: Leroy Lame, MD;  Location: [place];  Service: Endoscopy;  Laterality: N/A;  . COLONOSCOPY WITH PROPOFOL N/A 12/22/2016   Procedure: COLONOSCOPY WITH PROPOFOL;  Surgeon: Lin Landsman, MD;  Location: [place];  Service: Endoscopy;  Laterality: N/A;  . ESOPHAGOGASTRODUODENOSCOPY (EGD) WITH PROPOFOL N/A 12/22/2016   Procedure: ESOPHAGOGASTRODUODENOSCOPY (EGD) WITH PROPOFOL;  Surgeon: Lin Landsman, MD;  Location: [place];  Service: Endoscopy;  Laterality: N/A;  . rectal fistula surgery    . WRIST SURGERY      Prior to Admission medications   Medication Sig Start Date End Date Taking? Authorizing Provider  benztropine (COGENTIN) 0.5 MG tablet Take 1 tablet (0.5 mg total) by mouth at bedtime. 07/15/17  Yes Eappen, Ria Clock, MD  busPIRone (BUSPAR) 5 MG tablet Take 1 tablet (5 mg total) by mouth 2 (two) times daily. 07/15/17  Yes Ursula Alert, MD  ciprofloxacin (CIPRO) 500 MG tablet Take 1 tablet (500 mg total) by mouth 2 (two) times daily for 14 days. 09/09/17 09/23/17 Yes Vanga, Tally Due, MD  escitalopram (LEXAPRO) 10 MG tablet Take 1 tablet (10 mg total)  by mouth daily. 07/15/17  Yes Ursula Alert, MD  hydrOXYzine (VISTARIL) 25 MG capsule Take 1 capsule (25 mg total) by mouth daily as needed (severe anxiety attacks). 07/15/17  Yes Ursula Alert, MD  inFLIXimab (REMICADE) 100 MG injection Start 8m/kg IV on weeks 0,2,6 then continue every 8 weeks thereafter 01/10/17  Yes Vanga, RTally Due MD  metroNIDAZOLE (FLAGYL) 500 MG tablet Take 1 tablet (500 mg total) by mouth 2 (two) times daily for 14 days. 09/09/17 09/23/17 Yes Vanga, RTally Due MD  ARIPiprazole (ABILIFY) 5 MG tablet Take 1 tablet (5 mg total) by mouth at bedtime. Patient not taking: Reported on 09/09/2017 07/15/17   EUrsula Alert MD  budesonide  (ENTOCORT EC) 3 MG 24 hr capsule Take 3 capsules (9 mg total) by mouth daily. Patient not taking: Reported on 09/09/2017 07/20/17   VLin Landsman MD  diphenoxylate-atropine (LOMOTIL) 2.5-0.025 MG tablet Take 1 tablet by mouth 4 (four) times daily as needed for diarrhea or loose stools. Patient not taking: Reported on 09/09/2017 09/06/17 09/06/18  WEarleen Newport MD  folic acid (FOLVITE) 1 MG tablet TAKE 1 TABLET BY MOUTH EVERY DAY. TAKE 2 TABLETS ON DAY WHEN YOU TAKE METHOTREXATE Patient not taking: Reported on 09/09/2017 06/14/17   VLin Landsman MD  methotrexate (RHEUMATREX) 2.5 MG tablet Take 10 tablets by mouth once weekly for 12 weeks Caution:Chemotherapy. Protect from light. Patient not taking: Reported on 09/09/2017 03/11/17   VLin Landsman MD  ondansetron (ZOFRAN) 4 MG tablet Take 1 tablet (4 mg total) by mouth every 8 (eight) hours as needed for nausea or vomiting. Patient not taking: Reported on 09/09/2017 07/20/17   VLin Landsman MD  potassium chloride SA (K-DUR,KLOR-CON) 20 MEQ tablet Take 2 tablets (40 mEq total) by mouth daily for 5 days. 07/22/17 07/27/17  VLin Landsman MD  predniSONE (DELTASONE) 10 MG tablet TAKE 4 TABLETS BY MOUTH DAILY WITH BREAKFAST Patient not taking: Reported on 09/09/2017 08/30/17   VLin Landsman MD    Family History  Problem Relation Age of Onset  . Diabetes Paternal Grandfather   . Heart disease Paternal Grandfather   . Depression Mother   . Drug abuse Mother   . Osteoporosis Neg Hx      Social History   Tobacco Use  . Smoking status: Never Smoker  . Smokeless tobacco: Former UNetwork engineerUse Topics  . Alcohol use: Yes    Frequency: Never  . Drug use: No    Comment: hx of 3 year ago     Allergies as of 09/10/2017  . (No Known Allergies)    Review of Systems:    All systems reviewed and negative except where noted in HPI.   Physical Exam:  Vital signs in last 24 hours: Temp:  [97.4 F (36.3 C)-98.4 F  (36.9 C)] 97.4 F (36.3 C) (08/04 0440) Pulse Rate:  [56-130] 56 (08/04 0440) Resp:  [16-20] 20 (08/04 0440) BP: (94-136)/(60-89) 94/60 (08/04 0440) SpO2:  [97 %-99 %] 99 % (08/04 0440) Weight:  [126 lb 5.2 oz (57.3 kg)] 126 lb 5.2 oz (57.3 kg) (08/04 0007) Last BM Date: 09/11/17 General:   Pleasant, cooperative in NAD Head:  Normocephalic and atraumatic. Eyes:   No icterus.   Conjunctiva pink. PERRLA. Ears:  Normal auditory acuity. Neck:  Supple; no masses or thyroidomegaly Lungs: Respirations even and unlabored. Lungs clear to auscultation bilaterally.   No wheezes, crackles, or rhonchi.  Heart:  Regular rate and rhythm;  Without  murmur, clicks, rubs or gallops Abdomen:  Soft, nondistended, mildly diffusely tender. Normal bowel sounds. No appreciable masses or hepatomegaly.  No rebound or guarding.  Rectal:  Not performed. Msk:  Symmetrical without gross deformities.    Extremities:  Without edema, cyanosis or clubbing. Neurologic:  Alert and oriented x3;  grossly normal neurologically. Skin:  Intact without significant lesions or rashes. Cervical Nodes:  No significant cervical adenopathy. Psych:  Alert and cooperative. Normal affect.  LAB RESULTS: Recent Labs    09/10/17 1649 09/11/17 0554  WBC 13.1* 4.4  HGB 16.6 12.9*  HCT 47.2 36.7*  PLT 329 234   BMET Recent Labs    09/10/17 1649 09/11/17 0554  NA 134* 135  K 3.7 3.8  CL 92* 101  CO2 30 27  GLUCOSE 214* 131*  BUN 12 10  CREATININE 1.17 0.90  CALCIUM 9.0 7.5*   LFT Recent Labs    09/10/17 1649  PROT 7.8  ALBUMIN 3.7  AST 16  ALT 16  ALKPHOS 76  BILITOT 2.0*   PT/INR No results for input(s): LABPROT, INR in the last 72 hours.  STUDIES: Ct Abdomen Pelvis W Contrast  Result Date: 09/10/2017 CLINICAL DATA:  Diarrhea, vomiting, abdominal pain, possible rectal fistula. Crohn's disease. EXAM: CT ABDOMEN AND PELVIS WITH CONTRAST TECHNIQUE: Multidetector CT imaging of the abdomen and pelvis was performed  using the standard protocol following bolus administration of intravenous contrast. CONTRAST:  182m OMNIPAQUE IOHEXOL 300 MG/ML  SOLN COMPARISON:  04/30/2017 FINDINGS: Lower chest: Lung bases are clear. Hepatobiliary: Liver is within normal limits. 5 mm layering gallstone (series 2/image 28), without associated inflammatory changes. No intrahepatic or extrahepatic ductal dilatation. Pancreas: Within normal limits. Spleen: Within normal limits. Adrenals/Urinary Tract: Adrenal glands are within normal limits. Kidneys are within normal limits.  No hydronephrosis. Bladder is within normal limits. Stomach/Bowel: Stomach is within normal limits. Status post right hemicolectomy. Colonic suture line in the right mid abdomen. Associated new pericolic inflammatory changes at the suture line (series 2/image 32). Rectal wall thickening. Associated anorectal fistula at the 7 o'clock position (series 2/image 73) with fistulous tract extending into the right gluteal region (series 2/image 78), chronic. No associated perirectal abscess. Vascular/Lymphatic: No evidence of abdominal aortic aneurysm. Reactive nodes along the right mid abdominal mesentery measuring up to 11 mm (series 2/image 38), grossly unchanged. Reproductive: Prostate is unremarkable. Other: No abdominopelvic ascites.  No free air. Musculoskeletal: Visualized osseous structures are within normal limits. IMPRESSION: New inflammatory changes involving a colonic anastomosis in the right mid abdomen in this patient status post right hemicolectomy. Given the clinical history, this presumably reflects active inflammatory Crohn's disease. 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 No associated perirectal abscess. Cholelithiasis, without associated inflammatory changes. Electronically Signed   By: SJulian HyM.D.   On: 09/10/2017 21:46      Impression / Plan:   Assessment: Active Problems:   Exacerbation of Crohn's disease (HCC)   Leroy FORTINis a 32y.o. y/o male with with a history of long-standing Crohn disease who is being followed by Dr. VMarius Ditch  The patient was supposed to have a colonoscopy coming up soon and there was some question whether it can be done as an inpatient. The patient has been put on steroids and is feeling better today but still has some abdominal pain.  Plan:  The patient will continue his present treatment with steroids and we will notify Dr. VMarius Ditchto see if she would like to do his colonoscopy  while he is in the hospital or wait until the flare has subsided.  With his complicated course and multiple medications including Remicade and methotrexate Imuran and Entocort I would defer to whether she would want to do his procedure during this hospitalization. The patient is feeling better at the present time and we will continue to monitor his progress.  Thank you for involving me in the care of this patient.      LOS: 1 day   Leroy Lame, MD  09/11/2017, 9:39 AM    Note: This dictation was prepared with Dragon dictation along with smaller phrase technology. Any transcriptional errors that result from this process are unintentional.

## 2017-09-11 NOTE — Progress Notes (Signed)
[place] at [place] NAME: Leroy Hernandez    MR#:  [id number]  DATE OF BIRTH:  09-Jan-1986  SUBJECTIVE:   Patient here due to abdominal pain and noted to have a flareup of his underlying Crohn's.  Patient does have bloody stools with mucus intermittently.    REVIEW OF SYSTEMS:    Review of Systems  Constitutional: Negative for chills and fever.  HENT: Negative for congestion and tinnitus.   Eyes: Negative for blurred vision and double vision.  Respiratory: Negative for cough, shortness of breath and wheezing.   Cardiovascular: Negative for chest pain, orthopnea and PND.  Gastrointestinal: Positive for abdominal pain. Negative for diarrhea, nausea and vomiting.  Genitourinary: Negative for dysuria and hematuria.  Neurological: Negative for dizziness, sensory change and focal weakness.  All other systems reviewed and are negative.   Nutrition: clear liquids Tolerating Diet: Yes Tolerating PT: Ambulatory  DRUG ALLERGIES:  No Known Allergies  VITALS:  Blood pressure 94/60, pulse (!) 56, temperature (!) 97.4 F (36.3 C), temperature source Oral, resp. rate 20, height 5' 5"  (1.651 m), weight 57.3 kg (126 lb 5.2 oz), SpO2 99 %.  PHYSICAL EXAMINATION:   Physical Exam  GENERAL:  32 y.o.-year-old patient lying in bed in no acute distress.  EYES: Pupils equal, round, reactive to light. No scleral icterus. Extraocular muscles intact.  HEENT: Head atraumatic, normocephalic. Oropharynx and nasopharynx clear.  NECK:  Supple, no jugular venous distention. No thyroid enlargement, no tenderness.  LUNGS: Normal breath sounds bilaterally, no wheezing, rales, rhonchi. No use of accessory muscles of respiration.  CARDIOVASCULAR: S1, S2 normal. No murmurs, rubs, or gallops.  ABDOMEN: Soft, nontender, nondistended. Bowel sounds present. No organomegaly or mass.  EXTREMITIES: No cyanosis, clubbing or edema b/l.    NEUROLOGIC: Cranial nerves II through XII  are intact. No focal Motor or sensory deficits b/l.   PSYCHIATRIC: The patient is alert and oriented x 3.  SKIN: No obvious rash, lesion, or ulcer.    LABORATORY PANEL:   CBC Recent Labs  Lab 09/11/17 0554  WBC 4.4  HGB 12.9*  HCT 36.7*  PLT 234   ------------------------------------------------------------------------------------------------------------------  Chemistries  Recent Labs  Lab 09/10/17 1649 09/11/17 0554  NA 134* 135  K 3.7 3.8  CL 92* 101  CO2 30 27  GLUCOSE 214* 131*  BUN 12 10  CREATININE 1.17 0.90  CALCIUM 9.0 7.5*  AST 16  --   ALT 16  --   ALKPHOS 76  --   BILITOT 2.0*  --    ------------------------------------------------------------------------------------------------------------------  Cardiac Enzymes No results for input(s): TROPONINI in the last 168 hours. ------------------------------------------------------------------------------------------------------------------  RADIOLOGY:  Ct Abdomen Pelvis W Contrast  Result Date: 09/10/2017 CLINICAL DATA:  Diarrhea, vomiting, abdominal pain, possible rectal fistula. Crohn's disease. EXAM: CT ABDOMEN AND PELVIS WITH CONTRAST TECHNIQUE: Multidetector CT imaging of the abdomen and pelvis was performed using the standard protocol following bolus administration of intravenous contrast. CONTRAST:  173m OMNIPAQUE IOHEXOL 300 MG/ML  SOLN COMPARISON:  04/30/2017 FINDINGS: Lower chest: Lung bases are clear. Hepatobiliary: Liver is within normal limits. 5 mm layering gallstone (series 2/image 28), without associated inflammatory changes. No intrahepatic or extrahepatic ductal dilatation. Pancreas: Within normal limits. Spleen: Within normal limits. Adrenals/Urinary Tract: Adrenal glands are within normal limits. Kidneys are within normal limits.  No hydronephrosis. Bladder is within normal limits. Stomach/Bowel: Stomach is within normal limits. Status post right hemicolectomy. Colonic suture line in the right mid  abdomen. Associated new  pericolic inflammatory changes at the suture line (series 2/image 32). Rectal wall thickening. Associated anorectal fistula at the 7 o'clock position (series 2/image 73) with fistulous tract extending into the right gluteal region (series 2/image 78), chronic. No associated perirectal abscess. Vascular/Lymphatic: No evidence of abdominal aortic aneurysm. Reactive nodes along the right mid abdominal mesentery measuring up to 11 mm (series 2/image 38), grossly unchanged. Reproductive: Prostate is unremarkable. Other: No abdominopelvic ascites.  No free air. Musculoskeletal: Visualized osseous structures are within normal limits. IMPRESSION: New inflammatory changes involving a colonic anastomosis in the right mid abdomen in this patient status post right hemicolectomy. Given the clinical history, this presumably reflects active inflammatory Crohn's disease. 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 No associated perirectal abscess. Cholelithiasis, without associated inflammatory changes. Electronically Signed   By: Julian Hy M.D.   On: 09/10/2017 21:46     ASSESSMENT AND PLAN:   32 year old male with past medical history of Crohn's disease, anxiety, depression, history of rectal fistula who is followed by GI presents to the hospital due to abdominal pain.  1.  Abdominal pain- suspected to be secondary to flareup of his Crohn's disease.  Patient had a CT abdomen pelvis which showed New inflammatory changes involving a colonic anastomosis in the right mid abdomen in this patient status post right hemicolectomy. -Seen by gastroenterology and will continue IV steroids for now.  Patient was supposed to get outpatient colonoscopy next week but will defer this to his primary gastroenterologist who sees him tomorrow. -Continue supportive care with clear liquid diet, IV fluids, pain control. -Patient has been on Remicade infusions in the past.  2.   Anxiety/depression-continue BuSpar, Lexapro.   All the records are reviewed and case discussed with Care Management/Social Worker. Management plans discussed with the patient, family and they are in agreement.  CODE STATUS: Full code  DVT Prophylaxis: Ambulatory  TOTAL TIME TAKING CARE OF THIS PATIENT: 30 minutes.   POSSIBLE D/C IN 1-2 DAYS, DEPENDING ON CLINICAL CONDITION.   [name] M.D on 09/11/2017 at 1:17 PM  Between 7am to 6pm - Pager - [phone]  After 6pm go to [email or web address] - Technical brewer [facility]  Office  [phone]  CC: Primary care physician; [name], MD

## 2017-09-12 DIAGNOSIS — K5090 Crohn's disease, unspecified, without complications: Secondary | ICD-10-CM

## 2017-09-12 MED ORDER — CIPROFLOXACIN IN D5W 400 MG/200ML IV SOLN
400.0000 mg | Freq: Two times a day (BID) | INTRAVENOUS | Status: DC
  Administered 2017-09-12 – 2017-09-15 (×6): 400 mg via INTRAVENOUS
  Filled 2017-09-12 (×8): qty 200

## 2017-09-12 MED ORDER — HYDROCORTISONE NA SUCCINATE PF 100 MG IJ SOLR
100.0000 mg | Freq: Three times a day (TID) | INTRAMUSCULAR | Status: DC
  Administered 2017-09-12: 100 mg via INTRAVENOUS
  Filled 2017-09-12: qty 2

## 2017-09-12 MED ORDER — METRONIDAZOLE IN NACL 5-0.79 MG/ML-% IV SOLN
500.0000 mg | Freq: Two times a day (BID) | INTRAVENOUS | Status: DC
  Administered 2017-09-12 – 2017-09-15 (×5): 500 mg via INTRAVENOUS
  Filled 2017-09-12 (×9): qty 100

## 2017-09-12 MED ORDER — METHYLPREDNISOLONE SODIUM SUCC 125 MG IJ SOLR
60.0000 mg | Freq: Every day | INTRAMUSCULAR | Status: DC
  Administered 2017-09-12 – 2017-09-14 (×3): 60 mg via INTRAVENOUS
  Filled 2017-09-12 (×3): qty 2

## 2017-09-12 MED ORDER — ENOXAPARIN SODIUM 40 MG/0.4ML ~~LOC~~ SOLN
40.0000 mg | SUBCUTANEOUS | Status: DC
  Administered 2017-09-12 – 2017-09-14 (×3): 40 mg via SUBCUTANEOUS
  Filled 2017-09-12 (×3): qty 0.4

## 2017-09-12 MED ORDER — BOOST / RESOURCE BREEZE PO LIQD CUSTOM
1.0000 | Freq: Three times a day (TID) | ORAL | Status: DC
  Administered 2017-09-12 – 2017-09-13 (×3): 1 via ORAL

## 2017-09-12 NOTE — Progress Notes (Signed)
[place] at [place] NAME: Leroy Hernandez    MR#:  [id number]  DATE OF BIRTH:  07-02-1985  SUBJECTIVE:   Patient here due to abdominal pain and noted to have a flareup of his underlying Crohn's.   Patient reporting abdominal and rectal discomfort does have bloody stools with mucus intermittently.    REVIEW OF SYSTEMS:    Review of Systems  Constitutional: Negative for chills and fever.  HENT: Negative for congestion and tinnitus.   Eyes: Negative for blurred vision and double vision.  Respiratory: Negative for cough, shortness of breath and wheezing.   Cardiovascular: Negative for chest pain, orthopnea and PND.  Gastrointestinal: Positive for abdominal pain. Negative for diarrhea, nausea and vomiting.  Genitourinary: Negative for dysuria and hematuria.  Neurological: Negative for dizziness, sensory change and focal weakness.  All other systems reviewed and are negative.   Nutrition: clear liquids Tolerating Diet: Yes Tolerating PT: Ambulatory  DRUG ALLERGIES:  No Known Allergies  VITALS:  Blood pressure 112/76, pulse (!) 59, temperature (!) 97.5 F (36.4 C), temperature source Oral, resp. rate 16, height 5' 5"  (1.651 m), weight 57.3 kg (126 lb 5.2 oz), SpO2 100 %.  PHYSICAL EXAMINATION:   Physical Exam  GENERAL:  32 y.o.-year-old patient lying in bed in no acute distress.  EYES: Pupils equal, round, reactive to light. No scleral icterus. Extraocular muscles intact.  HEENT: Head atraumatic, normocephalic. Oropharynx and nasopharynx clear.  NECK:  Supple, no jugular venous distention. No thyroid enlargement, no tenderness.  LUNGS: Normal breath sounds bilaterally, no wheezing, rales, rhonchi. No use of accessory muscles of respiration.  CARDIOVASCULAR: S1, S2 normal. No murmurs, rubs, or gallops.  ABDOMEN: Soft, nontender, nondistended. Bowel sounds present. No organomegaly or mass.  EXTREMITIES: No cyanosis, clubbing or edema b/l.     NEUROLOGIC: Cranial nerves II through XII are intact. No focal Motor or sensory deficits b/l.   PSYCHIATRIC: The patient is alert and oriented x 3.  SKIN: No obvious rash, lesion, or ulcer.    LABORATORY PANEL:   CBC Recent Labs  Lab 09/11/17 0554  WBC 4.4  HGB 12.9*  HCT 36.7*  PLT 234   ------------------------------------------------------------------------------------------------------------------  Chemistries  Recent Labs  Lab 09/10/17 1649 09/11/17 0554  NA 134* 135  K 3.7 3.8  CL 92* 101  CO2 30 27  GLUCOSE 214* 131*  BUN 12 10  CREATININE 1.17 0.90  CALCIUM 9.0 7.5*  AST 16  --   ALT 16  --   ALKPHOS 76  --   BILITOT 2.0*  --    ------------------------------------------------------------------------------------------------------------------  Cardiac Enzymes No results for input(s): TROPONINI in the last 168 hours. ------------------------------------------------------------------------------------------------------------------  RADIOLOGY:  Ct Abdomen Pelvis W Contrast  Result Date: 09/10/2017 CLINICAL DATA:  Diarrhea, vomiting, abdominal pain, possible rectal fistula. Crohn's disease. EXAM: CT ABDOMEN AND PELVIS WITH CONTRAST TECHNIQUE: Multidetector CT imaging of the abdomen and pelvis was performed using the standard protocol following bolus administration of intravenous contrast. CONTRAST:  163m OMNIPAQUE IOHEXOL 300 MG/ML  SOLN COMPARISON:  04/30/2017 FINDINGS: Lower chest: Lung bases are clear. Hepatobiliary: Liver is within normal limits. 5 mm layering gallstone (series 2/image 28), without associated inflammatory changes. No intrahepatic or extrahepatic ductal dilatation. Pancreas: Within normal limits. Spleen: Within normal limits. Adrenals/Urinary Tract: Adrenal glands are within normal limits. Kidneys are within normal limits.  No hydronephrosis. Bladder is within normal limits. Stomach/Bowel: Stomach is within normal limits. Status post right  hemicolectomy. Colonic suture line in  the right mid abdomen. Associated new pericolic inflammatory changes at the suture line (series 2/image 32). Rectal wall thickening. Associated anorectal fistula at the 7 o'clock position (series 2/image 73) with fistulous tract extending into the right gluteal region (series 2/image 78), chronic. No associated perirectal abscess. Vascular/Lymphatic: No evidence of abdominal aortic aneurysm. Reactive nodes along the right mid abdominal mesentery measuring up to 11 mm (series 2/image 38), grossly unchanged. Reproductive: Prostate is unremarkable. Other: No abdominopelvic ascites.  No free air. Musculoskeletal: Visualized osseous structures are within normal limits. IMPRESSION: New inflammatory changes involving a colonic anastomosis in the right mid abdomen in this patient status post right hemicolectomy. Given the clinical history, this presumably reflects active inflammatory Crohn's disease. 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 No associated perirectal abscess. Cholelithiasis, without associated inflammatory changes. Electronically Signed   By: Julian Hy M.D.   On: 09/10/2017 21:46     ASSESSMENT AND PLAN:   32 year old male with past medical history of Crohn's disease, anxiety, depression, history of rectal fistula who is followed by GI presents to the hospital due to abdominal pain.  1.  Abdominal pain- suspected to be secondary to flareup of his Crohn's disease.  Patient had a CT abdomen pelvis which showed New inflammatory changes involving a colonic anastomosis in the right mid abdomen in this patient status post right hemicolectomy. -Seen by gastroenterology and will continue IV steroids for now.  Hydrocortisone changed to Solu-Medrol  -Full liquid diet  -GI has ordered fecal calprotectin, CRP -patient was supposed to get outpatient colonoscopy thisweek .primary gastroenterologist Dr. [name], if no clinical improvement in the next  24-48 hrs. will consider colonoscopy -Patient is started on Cipro and Flagyl -Continue supportive care with full liquid diet, PRN pain control. -Patient has been on Remicade infusions in the past.  GI is following Remicade antibodies and drug levels  2.  Anxiety/depression-continue BuSpar, Lexapro.   All the records are reviewed and case discussed with Care Management/Social Worker. Management plans discussed with the patient, family and they are in agreement.  CODE STATUS: Full code  DVT Prophylaxis: Ambulatory  TOTAL TIME TAKING CARE OF THIS PATIENT: 33 minutes.   POSSIBLE D/C IN 1-2 DAYS, DEPENDING ON CLINICAL CONDITION.   Nicholes Mango M.D on 09/12/2017 at 4:53 PM  Between 7am to 6pm - Pager - [phone]  After 6pm go to [email or web address] - Technical brewer [facility]  Office  [phone]  CC: Primary care physician; [name], MD

## 2017-09-12 NOTE — Progress Notes (Signed)
Leroy Darby, MD [address]  [place]  [place], [place] [address]  Main: [phone]  Fax: [phone] Pager: [phone]   Subjective: Tolerating clear liquid diet well, he is currently on hydrocortisone. Continue to have diarrhea, rectal discomfort and perianal discomfort  Objective: Vital signs in last 24 hours: Vitals:   09/11/17 1423 09/11/17 2005 09/12/17 0548 09/12/17 1238  BP: 102/60 96/61 98/75  112/76  Pulse: 67 61 69 (!) 59  Resp: 18 20 (!) 24 16  Temp: 97.6 F (36.4 C) 97.9 F (36.6 C) 97.7 F (36.5 C) (!) 97.5 F (36.4 C)  TempSrc: Oral Oral Oral Oral  SpO2: 99% 98% 100% 100%  Weight:      Height:       Weight change:   Intake/Output Summary (Last 24 hours) at 09/12/2017 1549 Last data filed at 09/12/2017 1359 Gross per 24 hour  Intake 3841 ml  Output 1020 ml  Net 2821 ml     Exam: Heart:: Regular rate and rhythm or S1S2 present Lungs: normal Abdomen: soft, nontender, mildly distended, normal bowel sounds   Lab Results: CBC Latest Ref Rng & Units 09/11/2017 09/10/2017 09/06/2017  WBC 3.8 - 10.6 K/uL 4.4 13.1(H) 10.2  Hemoglobin 13.0 - 18.0 g/dL 12.9(L) 16.6 15.6  Hematocrit 40.0 - 52.0 % 36.7(L) 47.2 44.6  Platelets 150 - 440 K/uL 234 329 284   BMP Latest Ref Rng & Units 09/11/2017 09/10/2017 09/06/2017  Glucose 70 - 99 mg/dL 131(H) 214(H) 106(H)  BUN 6 - 20 mg/dL 10 12 9   Creatinine 0.61 - 1.24 mg/dL 0.90 1.17 0.92  Sodium 135 - 145 mmol/L 135 134(L) 140  Potassium 3.5 - 5.1 mmol/L 3.8 3.7 3.3(L)  Chloride 98 - 111 mmol/L 101 92(L) 105  CO2 22 - 32 mmol/L 27 30 28   Calcium 8.9 - 10.3 mg/dL 7.5(L) 9.0 8.8(L)   Hepatic Function Latest Ref Rng & Units 09/10/2017 09/06/2017 07/20/2017  Total Protein 6.5 - 8.1 g/dL 7.8 7.1 7.9  Albumin 3.5 - 5.0 g/dL 3.7 3.7 4.3  AST 15 - 41 U/L 16 15 21   ALT 0 - 44 U/L 16 24 21   Alk Phosphatase 38 - 126 U/L 76 87 79  Total Bilirubin 0.3 - 1.2 mg/dL 2.0(H) 1.0 1.9(H)  Bilirubin, Direct 0.1 - 0.5 mg/dL - -  -   Micro Results: Recent Results (from the past 240 hour(s))  C difficile quick scan w PCR reflex     Status: None   Collection Time: 09/06/17  8:47 PM  Result Value Ref Range Status   C Diff antigen NEGATIVE NEGATIVE Final   C Diff toxin NEGATIVE NEGATIVE Final   C Diff interpretation No C. difficile detected.  Final    Comment: Performed at [facility], [address] [place], [place] [address]  Gastrointestinal Panel by PCR , Stool     Status: None   Collection Time: 09/06/17  8:47 PM  Result Value Ref Range Status   Campylobacter species NOT DETECTED NOT DETECTED Final   Plesimonas shigelloides NOT DETECTED NOT DETECTED Final   Salmonella species NOT DETECTED NOT DETECTED Final   Yersinia enterocolitica NOT DETECTED NOT DETECTED Final   Vibrio species NOT DETECTED NOT DETECTED Final   Vibrio cholerae NOT DETECTED NOT DETECTED Final   Enteroaggregative E coli (EAEC) NOT DETECTED NOT DETECTED Final   Enteropathogenic E coli (EPEC) NOT DETECTED NOT DETECTED Final   Enterotoxigenic E coli (ETEC) NOT DETECTED NOT DETECTED Final   Shiga like  toxin producing E coli (STEC) NOT DETECTED NOT DETECTED Final   Shigella/Enteroinvasive E coli (EIEC) NOT DETECTED NOT DETECTED Final   Cryptosporidium NOT DETECTED NOT DETECTED Final   Cyclospora cayetanensis NOT DETECTED NOT DETECTED Final   Entamoeba histolytica NOT DETECTED NOT DETECTED Final   Giardia lamblia NOT DETECTED NOT DETECTED Final   Adenovirus F40/41 NOT DETECTED NOT DETECTED Final   Astrovirus NOT DETECTED NOT DETECTED Final   Norovirus GI/GII NOT DETECTED NOT DETECTED Final   Rotavirus A NOT DETECTED NOT DETECTED Final   Sapovirus (I, II, IV, and V) NOT DETECTED NOT DETECTED Final    Comment: Performed at [facility], [address] [place], [place] [address]   Studies/Results: Ct Abdomen Pelvis W Contrast  Result Date: 09/10/2017 CLINICAL DATA:  Diarrhea, vomiting, abdominal pain, possible rectal  fistula. Crohn's disease. EXAM: CT ABDOMEN AND PELVIS WITH CONTRAST TECHNIQUE: Multidetector CT imaging of the abdomen and pelvis was performed using the standard protocol following bolus administration of intravenous contrast. CONTRAST:  125m OMNIPAQUE IOHEXOL 300 MG/ML  SOLN COMPARISON:  04/30/2017 FINDINGS: Lower chest: Lung bases are clear. Hepatobiliary: Liver is within normal limits. 5 mm layering gallstone (series 2/image 28), without associated inflammatory changes. No intrahepatic or extrahepatic ductal dilatation. Pancreas: Within normal limits. Spleen: Within normal limits. Adrenals/Urinary Tract: Adrenal glands are within normal limits. Kidneys are within normal limits.  No hydronephrosis. Bladder is within normal limits. Stomach/Bowel: Stomach is within normal limits. Status post right hemicolectomy. Colonic suture line in the right mid abdomen. Associated new pericolic inflammatory changes at the suture line (series 2/image 32). Rectal wall thickening. Associated anorectal fistula at the 7 o'clock position (series 2/image 73) with fistulous tract extending into the right gluteal region (series 2/image 78), chronic. No associated perirectal abscess. Vascular/Lymphatic: No evidence of abdominal aortic aneurysm. Reactive nodes along the right mid abdominal mesentery measuring up to 11 mm (series 2/image 38), grossly unchanged. Reproductive: Prostate is unremarkable. Other: No abdominopelvic ascites.  No free air. Musculoskeletal: Visualized osseous structures are within normal limits. IMPRESSION: New inflammatory changes involving a colonic anastomosis in the right mid abdomen in this patient status post right hemicolectomy. Given the clinical history, this presumably reflects active inflammatory Crohn's disease. 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 No associated perirectal abscess. Cholelithiasis, without associated inflammatory changes. Electronically Signed   By: SJulian Hy M.D.   On: 09/10/2017 21:46   Medications: I have reviewed the patient's current medications. Scheduled Meds: . benztropine  0.5 mg Oral QHS  . busPIRone  5 mg Oral BID  . enoxaparin (LOVENOX) injection  40 mg Subcutaneous Q24H  . escitalopram  10 mg Oral Daily  . feeding supplement  1 Container Oral TID BM  . methylPREDNISolone (SOLU-MEDROL) injection  60 mg Intravenous Daily   Continuous Infusions: . ciprofloxacin Stopped (09/12/17 1458)  . metronidazole 500 mg (09/12/17 1507)   PRN Meds:.acetaminophen **OR** acetaminophen, albuterol, HYDROcodone-acetaminophen, hydrOXYzine, morphine injection, ondansetron **OR** ondansetron (ZOFRAN) IV   Assessment: Active Problems:   Exacerbation of Crohn's disease ([facility]   Crohn's disease of colon with complication (HCC)  Postoperative recurrence of Crohn's disease, ileocolonic with anastomotic stricture, Rutgeert's i4, on weekly methotrexate + daily folate, Remicade every 8 weeks admitted with flare up of Crohn's disease. Stool studies have been negative for infectious etiology CT revealed Inflammatory changes involving the colonic anastomosis in the right midabdomen  Plan: Switch from hydrocortisone to Solu-Medrol 60 mg IV daily Full liquid diet Start Cipro and Flagyl Check CRP Check  fecal calprotectin Follow-up on Remicade antibodies and drug levels If patient is not clinically improving in next 24-48 hours, recommend colonoscopy   LOS: 2 days   Makya Yurko 09/12/2017, 3:49 PM

## 2017-09-13 DIAGNOSIS — K50912 Crohn's disease, unspecified, with intestinal obstruction: Secondary | ICD-10-CM

## 2017-09-13 LAB — HIV ANTIBODY (ROUTINE TESTING W REFLEX): HIV SCREEN 4TH GENERATION: NONREACTIVE

## 2017-09-13 LAB — INFLIXIMAB (IFX) CONC+ IFX AB
Anti-Infliximab Antibody: <22 ng/mL
Infliximab Drug Level: 2.4 ug/mL

## 2017-09-13 LAB — CBC
HEMATOCRIT: 35 % — AB (ref 40.0–52.0)
HEMOGLOBIN: 12.2 g/dL — AB (ref 13.0–18.0)
MCH: 29.2 pg (ref 26.0–34.0)
MCHC: 34.9 g/dL (ref 32.0–36.0)
MCV: 83.7 fL (ref 80.0–100.0)
Platelets: 241 10*3/uL (ref 150–440)
RBC: 4.18 MIL/uL — AB (ref 4.40–5.90)
RDW: 13.4 % (ref 11.5–14.5)
WBC: 10.2 10*3/uL (ref 3.8–10.6)

## 2017-09-13 LAB — BASIC METABOLIC PANEL
Anion gap: 4 — ABNORMAL LOW (ref 5–15)
BUN: 7 mg/dL (ref 6–20)
CO2: 29 mmol/L (ref 22–32)
CREATININE: 0.67 mg/dL (ref 0.61–1.24)
Calcium: 8 mg/dL — ABNORMAL LOW (ref 8.9–10.3)
Chloride: 107 mmol/L (ref 98–111)
GFR calc non Af Amer: >60 mL/min (ref >60)
Glucose, Bld: 141 mg/dL — ABNORMAL HIGH (ref 70–99)
POTASSIUM: 3.8 mmol/L (ref 3.5–5.1)
Sodium: 140 mmol/L (ref 135–145)

## 2017-09-13 LAB — GLUCOSE, CAPILLARY
GLUCOSE-CAPILLARY: 126 mg/dL — AB (ref 70–99)
Glucose-Capillary: 97 mg/dL (ref 70–99)
Glucose-Capillary: 85 mg/dL (ref 70–99)

## 2017-09-13 LAB — C-REACTIVE PROTEIN: CRP: 3.1 mg/dL — AB (ref <1.0)

## 2017-09-13 LAB — CALPROTECTIN, FECAL: CALPROTECTIN, FECAL: 71 ug/g (ref 0–120)

## 2017-09-13 LAB — MAGNESIUM: MAGNESIUM: 2.1 mg/dL (ref 1.7–2.4)

## 2017-09-13 MED ORDER — INSULIN ASPART 100 UNIT/ML ~~LOC~~ SOLN
0.0000 [IU] | Freq: Every day | SUBCUTANEOUS | Status: DC
  Administered 2017-09-14: 2 [IU] via SUBCUTANEOUS
  Filled 2017-09-13: qty 1

## 2017-09-13 MED ORDER — PEG 3350-KCL-NA BICARB-NACL 420 G PO SOLR
4000.0000 mL | Freq: Once | ORAL | Status: AC
  Administered 2017-09-13: 4000 mL via ORAL
  Filled 2017-09-13: qty 4000

## 2017-09-13 MED ORDER — INSULIN ASPART 100 UNIT/ML ~~LOC~~ SOLN
0.0000 [IU] | Freq: Three times a day (TID) | SUBCUTANEOUS | Status: DC
  Administered 2017-09-13: 1 [IU] via SUBCUTANEOUS
  Filled 2017-09-13: qty 1

## 2017-09-13 NOTE — Progress Notes (Signed)
[place] [facility] [place] NAME: Leroy Hernandez    MR#:  [id number]  DATE OF BIRTH:  Jul 20, 1985  SUBJECTIVE:   Patient here due to abdominal pain and noted to have a flareup of his underlying Crohn's.   Patient tolerating full liquids, likes ensure better, pain 6/10 , some distension   REVIEW OF SYSTEMS:    Review of Systems  Constitutional: Negative for chills and fever.  HENT: Negative for congestion and tinnitus.   Eyes: Negative for blurred vision and double vision.  Respiratory: Negative for cough, shortness of breath and wheezing.   Cardiovascular: Negative for chest pain, orthopnea and PND.  Gastrointestinal: Positive for abdominal pain. Negative for diarrhea, nausea and vomiting.  Genitourinary: Negative for dysuria and hematuria.  Neurological: Negative for dizziness, sensory change and focal weakness.  All other systems reviewed and are negative.   Nutrition: clear liquids Tolerating Diet: Yes Tolerating PT: Ambulatory  DRUG ALLERGIES:  No Known Allergies  VITALS:  Blood pressure 113/81, pulse (!) 57, temperature (!) 97.5 F (36.4 C), temperature source Oral, resp. rate 18, height 5' 5"  (1.651 m), weight 57.3 kg (126 lb 5.2 oz), SpO2 100 %.  PHYSICAL EXAMINATION:   Physical Exam  GENERAL:  32 y.o.-year-old patient lying in bed in no acute distress.  EYES: Pupils equal, round, reactive to light. No scleral icterus. Extraocular muscles intact.  HEENT: Head atraumatic, normocephalic. Oropharynx and nasopharynx clear.  NECK:  Supple, no jugular venous distention. No thyroid enlargement, no tenderness.  LUNGS: Normal breath sounds bilaterally, no wheezing, rales, rhonchi. No use of accessory muscles of respiration.  CARDIOVASCULAR: S1, S2 normal. No murmurs, rubs, or gallops.  ABDOMEN: Soft, nontender, some disstension . Bowel sounds present.  EXTREMITIES: No cyanosis, clubbing or edema b/l.    NEUROLOGIC: Cranial nerves II  through XII are intact. No focal Motor or sensory deficits b/l.   PSYCHIATRIC: The patient is alert and oriented x 3.  SKIN: No obvious rash, lesion, or ulcer.    LABORATORY PANEL:   CBC Recent Labs  Lab 09/13/17 0450  WBC 10.2  HGB 12.2*  HCT 35.0*  PLT 241   ------------------------------------------------------------------------------------------------------------------  Chemistries  Recent Labs  Lab 09/10/17 1649  09/13/17 0450  NA 134*   < > 140  K 3.7   < > 3.8  CL 92*   < > 107  CO2 30   < > 29  GLUCOSE 214*   < > 141*  BUN 12   < > 7  CREATININE 1.17   < > 0.67  CALCIUM 9.0   < > 8.0*  MG  --   --  2.1  AST 16  --   --   ALT 16  --   --   ALKPHOS 76  --   --   BILITOT 2.0*  --   --    < > = values in this interval not displayed.   ------------------------------------------------------------------------------------------------------------------  Cardiac Enzymes No results for input(s): TROPONINI in the last 168 hours. ------------------------------------------------------------------------------------------------------------------  RADIOLOGY:  No results found.   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  1.  Abdominal pain- suspected to be secondary to flareup of his Crohn's disease.  Patient had a CT abdomen pelvis which showed New inflammatory changes involving a colonic anastomosis in the right mid abdomen  in this patient status post right hemicolectomy. -Seen by gastroenterology and will continue IV steroids   Hydrocortisone changed to Solu-Medrol  -Full liquid diet  -GI has ordered fecal calprotectin,  3.1 -CRP -patient was supposed to get outpatient colonoscopy thisweek .primary gastroenterologist Dr. [name], if no clinical improvement in the next 24-48 hrs. will consider  colonoscopy -Patient is on Cipro and Flagyl -Continue supportive care with full liquid diet, PRN pain control. -Patient has been on Remicade infusions in the past.  GI is following Remicade antibodies and drug levels  2.  Anxiety/depression-continue BuSpar, Lexapro.  3. fatique - OOB as tolerated   All the records are reviewed and case discussed with Care Management/Social Worker. Management plans discussed with the patient, family and they are in agreement.  CODE STATUS: Full code  DVT Prophylaxis: Ambulatory  TOTAL TIME TAKING CARE OF THIS PATIENT: 32 minutes.   POSSIBLE D/C IN 1-2 DAYS, DEPENDING ON CLINICAL CONDITION.   Nicholes Mango M.D on 09/13/2017 at 10:52 AM  Between 7am to 6pm - Pager - [phone]  After 6pm go to [email or web address] - [employer] [facility]  Office  [phone]  CC: Primary care physician; [name], MD

## 2017-09-13 NOTE — Progress Notes (Signed)
[name], MD [address]  [place]  [place], [place] [address]  Main: [phone]  Fax: [phone] Pager: [phone]   Subjective: Tolerating full liquid diet well, feels better today, having BM every 2hrs, more energy, less abdominal pain and distension.   Objective: Vital signs in last 24 hours: Vitals:   09/12/17 0548 09/12/17 1238 09/12/17 2111 09/13/17 0447  BP: 98/75 112/76 102/68 113/81  Pulse: 69 (!) 59 (!) 55 (!) 57  Resp: (!) _0 Temp: 97.7 F (36.5 C) (!) 97.5 F (36.4 C) 97.7 F (36.5 C) (!) 97.5 F (36.4 C)  TempSrc: Oral Oral Oral Oral  SpO2: 100% 100% 100% 100%  Weight:      Height:       Weight change:   Intake/Output Summary (Last 24 hours) at 09/13/2017 1525 Last data filed at 09/13/2017 1300 Gross per 24 hour  Intake 1520 ml  Output 600 ml  Net 920 ml     Exam: Heart:: Regular rate and rhythm or S1S2 present Lungs: normal Abdomen: soft, nontender, normal bowel sounds   Lab Results: CBC Latest Ref Rng & Units 09/13/2017 09/11/2017 09/10/2017  WBC 3.8 - 10.6 K/uL 10.2 4.4 13.1(H)  Hemoglobin 13.0 - 18.0 g/dL 12.2(L) 12.9(L) 16.6  Hematocrit 40.0 - 52.0 % 35.0(L) 36.7(L) 47.2  Platelets 150 - 440 K/uL 241 234 329   BMP Latest Ref Rng & Units 09/13/2017 09/11/2017 09/10/2017  Glucose 70 - 99 mg/dL 141(H) 131(H) 214(H)  BUN 6 - 20 mg/dL _1 Creatinine 0.61 - 1.24 mg/dL 0.67 0.90 1.17  Sodium 135 - 145 mmol/L 140 135 134(L)  Potassium 3.5 - 5.1 mmol/L 3.8 3.8 3.7  Chloride 98 - 111 mmol/L 107 101 92(L)  CO2 22 - 32 mmol/L _2 Calcium 8.9 - 10.3 mg/dL 8.0(L) 7.5(L) 9.0   Hepatic Function Latest Ref Rng & Units 09/10/2017 09/06/2017 07/20/2017  Total Protein 6.5 - 8.1 g/dL 7.8 7.1 7.9  Albumin 3.5 - 5.0 g/dL 3.7 3.7 4.3  AST 15 - 41 U/L _3 ALT 0 - 44 U/L _4 Alk Phosphatase 38 - 126 U/L 76 87 79  Total Bilirubin 0.3 - 1.2 mg/dL 2.0(H) 1.0 1.9(H)  Bilirubin, Direct 0.1 - 0.5 mg/dL - - -   Micro  Results: Recent Results (from the past 240 hour(s))  C difficile quick scan w PCR reflex     Status: None   Collection Time: 09/06/17  8:47 PM  Result Value Ref Range Status   C Diff antigen NEGATIVE NEGATIVE Final   C Diff toxin NEGATIVE NEGATIVE Final   C Diff interpretation No C. difficile detected.  Final    Comment: Performed at [facility], [address] [place], [place] [address]  Gastrointestinal Panel by PCR , Stool     Status: None   Collection Time: 09/06/17  8:47 PM  Result Value Ref Range Status   Campylobacter species NOT DETECTED NOT DETECTED Final   Plesimonas shigelloides NOT DETECTED NOT DETECTED Final   Salmonella species NOT DETECTED NOT DETECTED Final   Yersinia enterocolitica NOT DETECTED NOT DETECTED Final   Vibrio species NOT DETECTED NOT DETECTED Final   Vibrio cholerae NOT DETECTED NOT DETECTED Final   Enteroaggregative E coli (EAEC) NOT DETECTED NOT DETECTED Final   Enteropathogenic E coli (EPEC) NOT DETECTED NOT DETECTED Final   Enterotoxigenic E coli (ETEC) NOT DETECTED NOT DETECTED Final  Shiga like toxin producing E coli (STEC) NOT DETECTED NOT DETECTED Final   Shigella/Enteroinvasive E coli (EIEC) NOT DETECTED NOT DETECTED Final   Cryptosporidium NOT DETECTED NOT DETECTED Final   Cyclospora cayetanensis NOT DETECTED NOT DETECTED Final   Entamoeba histolytica NOT DETECTED NOT DETECTED Final   Giardia lamblia NOT DETECTED NOT DETECTED Final   Adenovirus F40/41 NOT DETECTED NOT DETECTED Final   Astrovirus NOT DETECTED NOT DETECTED Final   Norovirus GI/GII NOT DETECTED NOT DETECTED Final   Rotavirus A NOT DETECTED NOT DETECTED Final   Sapovirus (I, II, IV, and V) NOT DETECTED NOT DETECTED Final    Comment: Performed at [facility], [address] [place], [place] [address]   Studies/Results: No results found. Medications: I have reviewed the patient's current medications. Scheduled Meds: . benztropine  0.5 mg Oral QHS  .  busPIRone  5 mg Oral BID  . enoxaparin (LOVENOX) injection  40 mg Subcutaneous Q24H  . escitalopram  10 mg Oral Daily  . feeding supplement  1 Container Oral TID BM  . insulin aspart  0-5 Units Subcutaneous QHS  . insulin aspart  0-9 Units Subcutaneous TID WC  . methylPREDNISolone (SOLU-MEDROL) injection  60 mg Intravenous Daily  . polyethylene glycol-electrolytes  4,000 mL Oral Once   Continuous Infusions: . ciprofloxacin Stopped (09/13/17 0239)  . metronidazole Stopped (09/13/17 0230)   PRN Meds:.acetaminophen **OR** acetaminophen, albuterol, HYDROcodone-acetaminophen, hydrOXYzine, morphine injection, ondansetron **OR** ondansetron (ZOFRAN) IV   Assessment: Active Problems:   Exacerbation of Crohn's disease ([place])   Crohn's disease of colon with complication (HCC)  Postoperative recurrence of Crohn's disease, ileocolonic with anastomotic stricture, Rutgeert's i4, on weekly methotrexate + daily folate, Remicade every 8 weeks admitted with flare up of Crohn's disease. Stool studies have been negative for infectious etiology CT revealed Inflammatory changes involving the colonic anastomosis in the right midabdomen  Plan: Continue Solu-Medrol 60 mg IV daily Continue Cipro and Flagyl, 2 weeks total fecal calprotectin in process Awaiting Remicade antibodies and drug levels Plan for colonoscopy tomorrow CLD  Bowel prep tonight   LOS: 3 days   Tanielle Emigh 09/13/2017, 3:25 PM

## 2017-09-14 ENCOUNTER — Encounter
Admission: EM | Disposition: A | Discharge status: Home / Self Care | Payer: Self-pay | Source: Home / Self Care | Attending: Internal Medicine

## 2017-09-14 ENCOUNTER — Inpatient Hospital Stay: Payer: BC Managed Care – PPO | Admitting: Certified Registered Nurse Anesthetist

## 2017-09-14 ENCOUNTER — Encounter: Payer: Self-pay | Admitting: Student

## 2017-09-14 ENCOUNTER — Ambulatory Visit: Admit: 2017-09-14 | Payer: BC Managed Care – PPO | Admitting: Gastroenterology

## 2017-09-14 DIAGNOSIS — K50913 Crohn's disease, unspecified, with fistula: Secondary | ICD-10-CM

## 2017-09-14 HISTORY — PX: COLONOSCOPY WITH PROPOFOL: SHX5780

## 2017-09-14 LAB — GLUCOSE, CAPILLARY
Glucose-Capillary: 79 mg/dL (ref 70–99)
Glucose-Capillary: 72 mg/dL (ref 70–99)
Glucose-Capillary: 209 mg/dL — ABNORMAL HIGH (ref 70–99)

## 2017-09-14 SURGERY — COLONOSCOPY WITH PROPOFOL
Anesthesia: Choice

## 2017-09-14 SURGERY — COLONOSCOPY
Anesthesia: General

## 2017-09-14 SURGERY — COLONOSCOPY WITH PROPOFOL
Anesthesia: General

## 2017-09-14 MED ORDER — LIDOCAINE HCL (PF) 2 % IJ SOLN
INTRAMUSCULAR | Status: AC
  Filled 2017-09-14: qty 10

## 2017-09-14 MED ORDER — PROPOFOL 500 MG/50ML IV EMUL
INTRAVENOUS | Status: AC
  Filled 2017-09-14: qty 50

## 2017-09-14 MED ORDER — PROPOFOL 500 MG/50ML IV EMUL
INTRAVENOUS | Status: DC | PRN
  Administered 2017-09-14: 100 ug/kg/min via INTRAVENOUS

## 2017-09-14 MED ORDER — PROPOFOL 10 MG/ML IV BOLUS
INTRAVENOUS | Status: DC | PRN
  Administered 2017-09-14: 100 mg via INTRAVENOUS

## 2017-09-14 MED ORDER — LIDOCAINE HCL (CARDIAC) PF 100 MG/5ML IV SOSY
PREFILLED_SYRINGE | INTRAVENOUS | Status: DC | PRN
  Administered 2017-09-14: 50 mg via INTRAVENOUS

## 2017-09-14 MED ORDER — FOLIC ACID 1 MG PO TABS: 2.0000 mg | ORAL_TABLET | Freq: Once | ORAL | Status: DC

## 2017-09-14 MED ORDER — SODIUM CHLORIDE 0.9 % IV SOLN
INTRAVENOUS | Status: DC
  Administered 2017-09-14: 16:00:00 via INTRAVENOUS

## 2017-09-14 MED ORDER — GLYCOPYRROLATE 0.2 MG/ML IJ SOLN
INTRAMUSCULAR | Status: AC
  Filled 2017-09-14: qty 1

## 2017-09-14 MED ORDER — FOLIC ACID 1 MG PO TABS
1.0000 mg | ORAL_TABLET | Freq: Every day | ORAL | Status: DC
  Administered 2017-09-15: 1 mg via ORAL
  Filled 2017-09-14: qty 1

## 2017-09-14 MED ORDER — GLYCOPYRROLATE 0.2 MG/ML IJ SOLN
INTRAMUSCULAR | Status: DC | PRN
  Administered 2017-09-14: 0.1 mg via INTRAVENOUS

## 2017-09-14 MED ORDER — SODIUM CHLORIDE 0.9 % IV SOLN
INTRAVENOUS | Status: DC
  Administered 2017-09-14: 15:00:00 via INTRAVENOUS

## 2017-09-14 MED ORDER — PROPOFOL 10 MG/ML IV BOLUS
INTRAVENOUS | Status: AC
  Filled 2017-09-14: qty 20

## 2017-09-14 MED ORDER — SODIUM CHLORIDE 0.9 % IV SOLN
10.0000 mg/kg | Freq: Once | INTRAVENOUS | Status: AC
  Administered 2017-09-14: 600 mg via INTRAVENOUS
  Filled 2017-09-14: qty 60

## 2017-09-14 NOTE — Transfer of Care (Signed)
Immediate Anesthesia Transfer of Care Note  Patient: Leroy Hernandez  Procedure(s) Performed: COLONOSCOPY WITH PROPOFOL (N/A )  Patient Location: PACU and Endoscopy Unit  Anesthesia Type:General  Level of Consciousness: drowsy and patient cooperative  Airway & Oxygen Therapy: Patient Spontanous Breathing  Post-op Assessment: Report given to RN, Post -op Vital signs reviewed and stable and Patient moving all extremities  Post vital signs: Reviewed and stable  Last Vitals:  Vitals Value Taken Time  BP 100/64 09/14/2017  4:04 PM  Temp 36.1 C 09/14/2017  4:04 PM  Pulse 68 09/14/2017  4:04 PM  Resp 18 09/14/2017  4:04 PM  SpO2 96 % 09/14/2017  4:04 PM    Last Pain:  Vitals:   09/14/17 1604  TempSrc: Tympanic  PainSc: 0-No pain      Patients Stated Pain Goal: 0 (97/94/99 7182)  Complications: No apparent anesthesia complications

## 2017-09-14 NOTE — Anesthesia Preprocedure Evaluation (Signed)
Anesthesia Evaluation  Patient identified by MRN, date of birth, ID band Patient awake    Reviewed: Allergy & Precautions, H&P , NPO status , reviewed documented beta blocker date and time   Airway Mallampati: II  TM Distance: >3 FB Neck ROM: full    Dental  (+) Teeth Intact   Pulmonary asthma ,    Pulmonary exam normal        Cardiovascular Normal cardiovascular exam     Neuro/Psych PSYCHIATRIC DISORDERS Anxiety Depression    GI/Hepatic   Endo/Other    Renal/GU      Musculoskeletal   Abdominal   Peds  Hematology  (+) anemia ,   Anesthesia Other Findings Past Medical History: No date: Anxiety No date: Asthma No date: Crohn's disease ([place]) No date: Depression No date: Rectal fistula  Past Surgical History: No date: COLON RESECTION No date: COLON SURGERY 04/03/2015: COLONOSCOPY WITH PROPOFOL; N/A     Comment:  Procedure: COLONOSCOPY WITH PROPOFOL;  Surgeon: Hulen Luster, MD;  Location: [facility] ENDOSCOPY;  Service: Endoscopy;                Laterality: N/A; 12/22/2015: COLONOSCOPY WITH PROPOFOL; N/A     Comment:  Procedure: COLONOSCOPY WITH PROPOFOL;  Surgeon: [name], MD;  Location: [place];  Service:               Endoscopy;  Laterality: N/A; 12/22/2016: COLONOSCOPY WITH PROPOFOL; N/A     Comment:  Procedure: COLONOSCOPY WITH PROPOFOL;  Surgeon: Lin Landsman, MD;  Location: [place];                Service: Endoscopy;  Laterality: N/A; 12/22/2016: ESOPHAGOGASTRODUODENOSCOPY (EGD) WITH PROPOFOL; N/A     Comment:  Procedure: ESOPHAGOGASTRODUODENOSCOPY (EGD) WITH               PROPOFOL;  Surgeon: Lin Landsman, MD;  Location:               [place];  Service: Endoscopy;  Laterality:               N/A; No date: rectal fistula surgery No date: WRIST SURGERY  BMI    Body Mass Index:  21.02 kg/m      Reproductive/Obstetrics                             Anesthesia Physical Anesthesia Plan  ASA: II  Anesthesia Plan: General   Post-op Pain Management:    Induction: Intravenous  PONV Risk Score and Plan: 2 and Treatment may vary due to age or medical condition and TIVA  Airway Management Planned: Nasal Cannula and Natural Airway  Additional Equipment:   Intra-op Plan:   Post-operative Plan:   Informed Consent: I have reviewed the patients History and Physical, chart, labs and discussed the procedure including the risks, benefits and alternatives for the proposed anesthesia with the patient or authorized representative who has indicated his/her understanding and acceptance.   Dental Advisory Given  Plan Discussed with: CRNA  Anesthesia Plan Comments:         Anesthesia Quick Evaluation

## 2017-09-14 NOTE — Progress Notes (Signed)
[facility] [place] NAME: Leroy Hernandez    MR#:  [id number]  DATE OF BIRTH:  [birth year]  SUBJECTIVE:   Patient here due to abdominal pain and noted to have a flareup of his underlying Crohn's.   Patient tolerating full liquids, likes ensure better, bowel movement spaced out to every 3-4 hours, feels less distended, scheduled for colonoscopy today, wants to eat regular food, very hungry  REVIEW OF SYSTEMS:    Review of Systems  Constitutional: Negative for chills and fever.  HENT: Negative for congestion and tinnitus.   Eyes: Negative for blurred vision and double vision.  Respiratory: Negative for cough, shortness of breath and wheezing.   Cardiovascular: Negative for chest pain, orthopnea and PND.  Gastrointestinal: Positive for abdominal pain. Negative for diarrhea, nausea and vomiting.  Genitourinary: Negative for dysuria and hematuria.  Neurological: Negative for dizziness, sensory change and focal weakness.  All other systems reviewed and are negative.   Nutrition: clear liquids Tolerating Diet: Yes Tolerating PT: Ambulatory  DRUG ALLERGIES:  No Known Allergies  VITALS:  Blood pressure 108/75, pulse (!) 52, temperature 98 F (36.7 C), temperature source Oral, resp. rate 16, height 5' 5"  (1.651 m), weight 57.3 kg (126 lb 5.2 oz), SpO2 100 %.  PHYSICAL EXAMINATION:   Physical Exam  GENERAL:  32 y.o.-year-old patient lying in bed in no acute distress.  EYES: Pupils equal, round, reactive to light. No scleral icterus. Extraocular muscles intact.  HEENT: Head atraumatic, normocephalic. Oropharynx and nasopharynx clear.  NECK:  Supple, no jugular venous distention. No thyroid enlargement, no tenderness.  LUNGS: Normal breath sounds bilaterally, no wheezing, rales, rhonchi. No use of accessory muscles of respiration.  CARDIOVASCULAR: S1, S2 normal. No murmurs, rubs, or gallops.  ABDOMEN: Soft, nontender, some disstension . Bowel  sounds present.  EXTREMITIES: No cyanosis, clubbing or edema b/l.    NEUROLOGIC: Cranial nerves II through XII are intact. No focal Motor or sensory deficits b/l.   PSYCHIATRIC: The patient is alert and oriented x 3.  SKIN: No obvious rash, lesion, or ulcer.    LABORATORY PANEL:   CBC Recent Labs  Lab 09/13/17 0450  WBC 10.2  HGB 12.2*  HCT 35.0*  PLT 241   ------------------------------------------------------------------------------------------------------------------  Chemistries  Recent Labs  Lab 09/10/17 1649  09/13/17 0450  NA 134*   < > 140  K 3.7   < > 3.8  CL 92*   < > 107  CO2 30   < > 29  GLUCOSE 214*   < > 141*  BUN 12   < > 7  CREATININE 1.17   < > 0.67  CALCIUM 9.0   < > 8.0*  MG  --   --  2.1  AST 16  --   --   ALT 16  --   --   ALKPHOS 76  --   --   BILITOT 2.0*  --   --    < > = values in this interval not displayed.   ------------------------------------------------------------------------------------------------------------------  Cardiac Enzymes No results for input(s): TROPONINI in the last 168 hours. ------------------------------------------------------------------------------------------------------------------  RADIOLOGY:  No results found.   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  1.  Abdominal pain- suspected to be secondary to flareup of his Crohn's disease.   Patient had a CT  abdomen pelvis which showed New inflammatory changes involving a colonic anastomosis in the right mid abdomen in this patient status post right hemicolectomy. -Seen by gastroenterology and will continue IV steroids   Hydrocortisone changed to Solu-Medrol  -N.p.o. for scheduled colonoscopy today -GI has ordered fecal calprotectin,  3.1 -CRP -Patient is on Cipro and Flagyl -Continue supportive  care , PRN pain control. -Patient has been on Remicade infusions in the past.  GI is following Remicade antibodies and drug levels  2.  Anxiety/depression-continue BuSpar, Lexapro.  3. fatique - OOB as tolerated   All the records are reviewed and case discussed with Care Management/Social Worker. Management plans discussed with the patient, family and they are in agreement.  CODE STATUS: Full code  DVT Prophylaxis: Ambulatory  TOTAL TIME TAKING CARE OF THIS PATIENT: 32 minutes.   POSSIBLE D/C IN 1-2 DAYS, DEPENDING ON CLINICAL CONDITION.   Nicholes Mango M.D on 09/14/2017 at 3:11 PM  Between 7am to 6pm - Pager - [phone]  After 6pm go to [email or web address] - [employer] [facility]  Office  [phone]  CC: Primary care physician; [name], MD

## 2017-09-14 NOTE — Anesthesia Post-op Follow-up Note (Signed)
Anesthesia QCDR form completed.        

## 2017-09-14 NOTE — Anesthesia Postprocedure Evaluation (Signed)
Anesthesia Post Note  Patient: Leroy Hernandez  Procedure(s) Performed: COLONOSCOPY WITH PROPOFOL (N/A )  Patient location during evaluation: Endoscopy Anesthesia Type: General Level of consciousness: awake and alert, oriented and patient cooperative Pain management: satisfactory to patient Vital Signs Assessment: post-procedure vital signs reviewed and stable Respiratory status: spontaneous breathing and respiratory function stable Cardiovascular status: blood pressure returned to baseline and stable Postop Assessment: no headache, no backache, patient able to bend at knees, no apparent nausea or vomiting, adequate PO intake and able to ambulate Anesthetic complications: no     Last Vitals:  Vitals:   09/14/17 1516 09/14/17 1604  BP: 117/80 100/64  Pulse: 64 68  Resp: 18 18  Temp: 36.6 C (!) 36.1 C  SpO2: 100% 96%    Last Pain:  Vitals:   09/14/17 1604  TempSrc: Tympanic  PainSc: 0-No pain                 Halvor Behrend H Chrisie Jankovich

## 2017-09-14 NOTE — Op Note (Signed)
[facility] Patient Name: Leroy Hernandez Procedure Date: 09/14/2017 3:32 PM MRN: [id number] Account #: [id number] Date of Birth: 1985-11-24 Admit Type: Inpatient Age: 32 Room: [facility] ENDO ROOM 4 Gender: Male Note Status: Finalized Procedure:            Colonoscopy Indications:          Disease activity assessment of Crohn's disease of the                        small bowel and colon, Assess therapeutic response to                        therapy of Crohn's disease of the small bowel and colon Providers:            Lin Landsman MD, MD Medicines:            Monitored Anesthesia Care Complications:        No immediate complications. Estimated blood loss: None. Procedure:            Pre-Anesthesia Assessment:                       - Prior to the procedure, a History and Physical was                        performed, and patient medications and allergies were                        reviewed. The patient is competent. The risks and                        benefits of the procedure and the sedation options and                        risks were discussed with the patient. All questions                        were answered and informed consent was obtained.                        Patient identification and proposed procedure were                        verified by the physician, the nurse, the                        anesthesiologist, the anesthetist and the technician in                        the pre-procedure area in the procedure room in the                        endoscopy suite. Mental Status Examination: alert and                        oriented. Airway Examination: normal oropharyngeal                        airway and neck mobility. Respiratory Examination:  clear to auscultation. CV Examination: normal.                        Prophylactic Antibiotics: The patient does not require                        prophylactic  antibiotics. Prior Anticoagulants: The                        patient has taken no previous anticoagulant or                        antiplatelet agents. ASA Grade Assessment: II - A                        patient with mild systemic disease. After reviewing the                        risks and benefits, the patient was deemed in                        satisfactory condition to undergo the procedure. The                        anesthesia plan was to use monitored anesthesia care                        (MAC). Immediately prior to administration of                        medications, the patient was re-assessed for adequacy                        to receive sedatives. The heart rate, respiratory rate,                        oxygen saturations, blood pressure, adequacy of                        pulmonary ventilation, and response to care were                        monitored throughout the procedure. The physical status                        of the patient was re-assessed after the procedure.                       After obtaining informed consent, the colonoscope was                        passed under direct vision. Throughout the procedure,                        the patient's blood pressure, pulse, and oxygen                        saturations were monitored continuously. The                        Colonoscope  was introduced through the anus and                        advanced to the the ileocolonic anastomosis. The                        colonoscopy was performed without difficulty. The                        patient tolerated the procedure well. The quality of                        the bowel preparation was adequate. Findings:      The perianal exam findings include perianal fistula and a skin tag.      There was evidence of a prior functional end-to-end ileo-colonic       anastomosis in the transverse colon. This was patent and was       characterized by edema, erythema and friable  mucosa. The anastomosis was       traversed.      Inflammation characterized by congestion (edema), aphthous ulcerations       and shallow ulcerations was found in a continuous and circumferential       pattern from the anus to the sigmoid colon. The descending colon and the       transverse colon were spared. This was graded as Rutgeerts Score i2       (more than five aphthous lesions with normal intervening mucosa or skip       areas of larger lesions or lesions confined to the ileocolonic       anastomosis), and when compared to previous examinations, the findings       are improved. Biopsies were taken from the rectum with a cold forceps       for histology. Impression:           - Perianal fistula and perianal skin tag found on                        perianal exam.                       - Patent functional end-to-end ileo-colonic                        anastomosis, characterized by edema, erythema and                        friable mucosa.                       - A few ulcers in the neo-terminal ileum. Rutgeert's i2                        improved from i4 disease on remicade                       - Congested and superficial ulcerated mucosa in the                        rectum and in the recto-sigmoid colon. Biopsied. Recommendation:       - Return patient to hospital ward for ongoing care.                       -  Resume regular diet today.                       - Continue present medications.                       - Await pathology results.                       - Administer remicade 102m/kg                       - Stop IV steroids                       - Continue antibiotics Procedure Code(s):    --- Professional ---                       [phone] Colonoscopy, flexible; with biopsy, single or                        multiple Diagnosis Code(s):    --- Professional ---                       Z98.0, Intestinal bypass and anastomosis status                       K63.3, Ulcer of  intestine                       K62.89, Other specified diseases of anus and rectum                       K63.89, Other specified diseases of intestine                       K60.3, Anal fistula                       K64.4, Residual hemorrhoidal skin tags                       K50.80, Crohn's disease of both small and large                        intestine without complications CPT copyright 2017 American Medical Association. All rights reserved. The codes documented in this report are preliminary and upon coder review may  be revised to meet current compliance requirements. Dr. RUlyess MortRLin LandsmanMD, MD 09/14/2017 4:12:41 PM This report has been signed electronically. Number of Addenda: 0 Note Initiated On: 09/14/2017 3:32 PM Scope Withdrawal Time: 0 hours 7 minutes 30 seconds  Total Procedure Duration: 0 hours 9 minutes 14 seconds       [facility]

## 2017-09-14 NOTE — Progress Notes (Signed)
   R , MD [address]  [address]  [place], [place] [address]  Main: [phone]  Fax: [phone] Pager: [phone]   Subjective: Feeling significantly better today. Tolerated bowel prep well.   Objective: Vital signs in last 24 hours: Vitals:   09/14/17 1516 09/14/17 1604 09/14/17 1614 09/14/17 1624  BP: 117/80 100/64 99/69 114/71  Pulse: 64 68 66 60  Resp: 18 18 16   Temp: 97.8 F (36.6 C) (!) 97 F (36.1 C)    TempSrc: Tympanic Tympanic    SpO2: 100% 96% 100% 99%  Weight:      Height:       Weight change:   Intake/Output Summary (Last 24 hours) at 09/14/2017 1630 Last data filed at 09/14/2017 1600 Gross per 24 hour  Intake 1040 ml  Output 1100 ml  Net -60 ml     Exam: Heart:: Regular rate and rhythm or S1S2 present Lungs: normal Abdomen: soft, nontender, normal bowel sounds   Lab Results: CBC Latest Ref Rng & Units 09/13/2017 09/11/2017 09/10/2017  WBC 3.8 - 10.6 K/uL 10.2 4.4 13.1(H)  Hemoglobin 13.0 - 18.0 g/dL 12.2(L) 12.9(L) 16.6  Hematocrit 40.0 - 52.0 % 35.0(L) 36.7(L) 47.2  Platelets 150 - 440 K/uL 241 234 329   BMP Latest Ref Rng & Units 09/13/2017 09/11/2017 09/10/2017  Glucose 70 - 99 mg/dL 141(H) 131(H) 214(H)  BUN 6 - 20 mg/dL 7 10 12  Creatinine 0.61 - 1.24 mg/dL 0.67 0.90 1.17  Sodium 135 - 145 mmol/L 140 135 134(L)  Potassium 3.5 - 5.1 mmol/L 3.8 3.8 3.7  Chloride 98 - 111 mmol/L 107 101 92(L)  CO2 22 - 32 mmol/L 29 27 30  Calcium 8.9 - 10.3 mg/dL 8.0(L) 7.5(L) 9.0   Hepatic Function Latest Ref Rng & Units 09/10/2017 09/06/2017 07/20/2017  Total Protein 6.5 - 8.1 g/dL 7.8 7.1 7.9  Albumin 3.5 - 5.0 g/dL 3.7 3.7 4.3  AST 15 - 41 U/L 16 15 21  ALT 0 - 44 U/L 16 24 21  Alk Phosphatase 38 - 126 U/L 76 87 79  Total Bilirubin 0.3 - 1.2 mg/dL 2.0(H) 1.0 1.9(H)  Bilirubin, Direct 0.1 - 0.5 mg/dL - - -   Micro Results: Recent Results (from the past 240 hour(s))  C difficile quick scan w PCR reflex     Status: None   Collection  Time: 09/06/17  8:47 PM  Result Value Ref Range Status   C Diff antigen NEGATIVE NEGATIVE Final   C Diff toxin NEGATIVE NEGATIVE Final   C Diff interpretation No C. difficile detected.  Final    Comment: Performed at [facility], [address] [place], [place] [address]  Gastrointestinal Panel by PCR , Stool     Status: None   Collection Time: 09/06/17  8:47 PM  Result Value Ref Range Status   Campylobacter species NOT DETECTED NOT DETECTED Final   Plesimonas shigelloides NOT DETECTED NOT DETECTED Final   Salmonella species NOT DETECTED NOT DETECTED Final   Yersinia enterocolitica NOT DETECTED NOT DETECTED Final   Vibrio species NOT DETECTED NOT DETECTED Final   Vibrio cholerae NOT DETECTED NOT DETECTED Final   Enteroaggregative E coli (EAEC) NOT DETECTED NOT DETECTED Final   Enteropathogenic E coli (EPEC) NOT DETECTED NOT DETECTED Final   Enterotoxigenic E coli (ETEC) NOT DETECTED NOT DETECTED Final   Shiga like toxin producing E coli (STEC) NOT DETECTED NOT DETECTED Final   Shigella/Enteroinvasive E coli (EIEC) NOT DETECTED NOT DETECTED   Final   Cryptosporidium NOT DETECTED NOT DETECTED Final   Cyclospora cayetanensis NOT DETECTED NOT DETECTED Final   Entamoeba histolytica NOT DETECTED NOT DETECTED Final   Giardia lamblia NOT DETECTED NOT DETECTED Final   Adenovirus F40/41 NOT DETECTED NOT DETECTED Final   Astrovirus NOT DETECTED NOT DETECTED Final   Norovirus GI/GII NOT DETECTED NOT DETECTED Final   Rotavirus A NOT DETECTED NOT DETECTED Final   Sapovirus (I, II, IV, and V) NOT DETECTED NOT DETECTED Final    Comment: Performed at [facility], [address] [place], [place] [address]  Calprotectin, Fecal     Status: None   Collection Time: 09/12/17  3:55 PM  Result Value Ref Range Status   Calprotectin, Fecal 71 0 - 120 ug/g Final    Comment: (NOTE) Concentration     Interpretation   Follow-Up <16 - 50 ug/g     Normal           None >50 -120 ug/g      Borderline       Re-evaluate in 4-6 weeks    >120 ug/g     Abnormal         Repeat as clinically                                   indicated Performed At: [facility]  [address] [place], [place] [id number] Nagendra Sanjai MD Ph:[phone]    Studies/Results: No results found. Medications: I have reviewed the patient's current medications. Scheduled Meds: . [MAR Hold] benztropine  0.5 mg Oral QHS  . [MAR Hold] busPIRone  5 mg Oral BID  . [MAR Hold] enoxaparin (LOVENOX) injection  40 mg Subcutaneous Q24H  . [MAR Hold] escitalopram  10 mg Oral Daily  . [MAR Hold] feeding supplement  1 Container Oral TID BM  . [START ON 09/15/2017] folic acid  1 mg Oral Daily  . folic acid  2 mg Oral Once  . [MAR Hold] insulin aspart  0-5 Units Subcutaneous QHS  . [MAR Hold] insulin aspart  0-9 Units Subcutaneous TID WC   Continuous Infusions: . sodium chloride    . sodium chloride 20 mL/hr at 09/14/17 1527  . [MAR Hold] ciprofloxacin 400 mg (09/14/17 1228)  . [MAR Hold] inFLIXimab (REMICADE) infusion    . [MAR Hold] metronidazole Stopped (09/14/17 0150)   PRN Meds:.[MAR Hold] acetaminophen **OR** [MAR Hold] acetaminophen, [MAR Hold] albuterol, [MAR Hold] HYDROcodone-acetaminophen, [MAR Hold] hydrOXYzine, [MAR Hold]  morphine injection, [MAR Hold] ondansetron **OR** [MAR Hold] ondansetron (ZOFRAN) IV   Assessment: Active Problems:   Exacerbation of Crohn's disease (HCC)   Crohn's disease of colon with complication (HCC)  Postoperative recurrence of Crohn's disease, ileocolonic with anastomotic stricture, Rutgeert's i4, on weekly methotrexate + daily folate, Remicade every 8 weeks admitted with flare up of Crohn's disease. Stool studies have been negative for infectious etiology Patient underwent colonoscopy today which revealed moderate inflammation in the rectum and rectosigmoid colon, however with significant endoscopic response to Remicade at the ileocolonic anastomosis, regressed from  Rutgeert's i4 to i2 stage. Patient has Infliximab levels in subtherapeutic range and undetectable antibodies  Plan: Discontinue Solu-Medrol Continue Cipro and Flagyl, 2 weeks total Recommend to administer Remicade 10mg/kg today and continue as outpatient per protocol Continue weekly methotrexate 25 MG by mouth and daily folate Advance diet as tolerated   LOS: 4 days     09/14/2017, 4:30 PM 

## 2017-09-15 ENCOUNTER — Ambulatory Visit
Admission: RE | Admit: 2017-09-15 | Payer: BC Managed Care – PPO | Source: Ambulatory Visit | Admitting: Gastroenterology

## 2017-09-15 LAB — CBC
HEMATOCRIT: 36.4 % — AB (ref 40.0–52.0)
Hemoglobin: 12.5 g/dL — ABNORMAL LOW (ref 13.0–18.0)
MCH: 28.6 pg (ref 26.0–34.0)
MCHC: 34.4 g/dL (ref 32.0–36.0)
MCV: 83 fL (ref 80.0–100.0)
PLATELETS: 260 10*3/uL (ref 150–440)
RBC: 4.38 MIL/uL — AB (ref 4.40–5.90)
RDW: 13 % (ref 11.5–14.5)
WBC: 9.9 10*3/uL (ref 3.8–10.6)

## 2017-09-15 LAB — GLUCOSE, CAPILLARY
GLUCOSE-CAPILLARY: 90 mg/dL (ref 70–99)
Glucose-Capillary: 90 mg/dL (ref 70–99)

## 2017-09-15 LAB — BASIC METABOLIC PANEL
Anion gap: 6 (ref 5–15)
BUN: 12 mg/dL (ref 6–20)
CO2: 29 mmol/L (ref 22–32)
Calcium: 7.7 mg/dL — ABNORMAL LOW (ref 8.9–10.3)
Chloride: 104 mmol/L (ref 98–111)
Creatinine, Ser: 0.68 mg/dL (ref 0.61–1.24)
GFR calc Af Amer: >60 mL/min (ref >60)
GLUCOSE: 138 mg/dL — AB (ref 70–99)
POTASSIUM: 3.4 mmol/L — AB (ref 3.5–5.1)
Sodium: 139 mmol/L (ref 135–145)

## 2017-09-15 MED ORDER — METRONIDAZOLE 500 MG PO TABS: 500.0000 mg | ORAL_TABLET | Freq: Two times a day (BID) | ORAL | 0 refills | Status: AC

## 2017-09-15 MED ORDER — ACETAMINOPHEN 325 MG PO TABS: 650.0000 mg | ORAL_TABLET | Freq: Four times a day (QID) | ORAL | Status: DC | PRN

## 2017-09-15 MED ORDER — MESALAMINE 4 G RE ENEM: 4.0000 g | ENEMA | Freq: Every day | RECTAL | 0 refills | Status: DC

## 2017-09-15 MED ORDER — METRONIDAZOLE 500 MG PO TABS
500.0000 mg | ORAL_TABLET | Freq: Two times a day (BID) | ORAL | Status: DC
  Filled 2017-09-15: qty 1

## 2017-09-15 MED ORDER — MESALAMINE 4 G RE ENEM
4.0000 g | ENEMA | Freq: Every day | RECTAL | Status: DC
  Filled 2017-09-15: qty 60

## 2017-09-15 MED ORDER — MESALAMINE 4 G RE ENEM
4.0000 g | ENEMA | Freq: Once | RECTAL | Status: AC
  Administered 2017-09-15: 4 g via RECTAL
  Filled 2017-09-15: qty 60

## 2017-09-15 MED ORDER — CIPROFLOXACIN HCL 500 MG PO TABS: 500.0000 mg | ORAL_TABLET | Freq: Two times a day (BID) | ORAL | Status: DC

## 2017-09-15 MED ORDER — OXYCODONE HCL 5 MG PO TABS: 5.0000 mg | ORAL_TABLET | Freq: Four times a day (QID) | ORAL | 0 refills | Status: DC | PRN

## 2017-09-15 MED ORDER — CIPROFLOXACIN HCL 500 MG PO TABS: 500.0000 mg | ORAL_TABLET | Freq: Two times a day (BID) | ORAL | 0 refills | Status: AC

## 2017-09-15 MED ORDER — OXYCODONE HCL 5 MG PO TABS: 5.0000 mg | ORAL_TABLET | Freq: Four times a day (QID) | ORAL | Status: DC | PRN

## 2017-09-15 MED ORDER — FOLIC ACID 1 MG PO TABS: ORAL_TABLET | ORAL | 0 refills | Status: DC

## 2017-09-15 MED ORDER — POTASSIUM CHLORIDE CRYS ER 20 MEQ PO TBCR
40.0000 meq | EXTENDED_RELEASE_TABLET | Freq: Once | ORAL | Status: AC
  Administered 2017-09-15: 40 meq via ORAL
  Filled 2017-09-15: qty 2

## 2017-09-15 MED ORDER — METHOTREXATE 2.5 MG PO TABS: ORAL_TABLET | ORAL | 0 refills | Status: DC

## 2017-09-15 NOTE — Progress Notes (Signed)
Leroy Darby, MD [address]  [place]  [place], [place] [address]  Main: [phone]  Fax: [phone] Pager: [phone]   Subjective: Feeling better today. Having soft brown stools, using bathroom every 2hrs. Continues to have rectal discomfort and burning.   Objective: Vital signs in last 24 hours: Vitals:   09/14/17 2225 09/14/17 2306 09/15/17 0441 09/15/17 0650  BP: 106/62 110/69 (!) 90/55 105/63  Pulse: 74 68 (!) 51 60  Resp: 16  18   Temp: (!) 97.5 F (36.4 C) (!) 97.5 F (36.4 C) 97.8 F (36.6 C)   TempSrc: Oral Oral Oral   SpO2: 97% 98% 96%   Weight:      Height:       Weight change:   Intake/Output Summary (Last 24 hours) at 09/15/2017 1140 Last data filed at 09/15/2017 1023 Gross per 24 hour  Intake 680 ml  Output 950 ml  Net -270 ml     Exam: Heart:: Regular rate and rhythm or S1S2 present Lungs: normal Abdomen: soft, nontender, normal bowel sounds   Lab Results: CBC Latest Ref Rng & Units 09/15/2017 09/13/2017 09/11/2017  WBC 3.8 - 10.6 K/uL 9.9 10.2 4.4  Hemoglobin 13.0 - 18.0 g/dL 12.5(L) 12.2(L) 12.9(L)  Hematocrit 40.0 - 52.0 % 36.4(L) 35.0(L) 36.7(L)  Platelets 150 - 440 K/uL 260 241 234   BMP Latest Ref Rng & Units 09/15/2017 09/13/2017 09/11/2017  Glucose 70 - 99 mg/dL 138(H) 141(H) 131(H)  BUN 6 - 20 mg/dL 12 7 10   Creatinine 0.61 - 1.24 mg/dL 0.68 0.67 0.90  Sodium 135 - 145 mmol/L 139 140 135  Potassium 3.5 - 5.1 mmol/L 3.4(L) 3.8 3.8  Chloride 98 - 111 mmol/L 104 107 101  CO2 22 - 32 mmol/L 29 29 27   Calcium 8.9 - 10.3 mg/dL 7.7(L) 8.0(L) 7.5(L)   Hepatic Function Latest Ref Rng & Units 09/10/2017 09/06/2017 07/20/2017  Total Protein 6.5 - 8.1 g/dL 7.8 7.1 7.9  Albumin 3.5 - 5.0 g/dL 3.7 3.7 4.3  AST 15 - 41 U/L 16 15 21   ALT 0 - 44 U/L 16 24 21   Alk Phosphatase 38 - 126 U/L 76 87 79  Total Bilirubin 0.3 - 1.2 mg/dL 2.0(H) 1.0 1.9(H)  Bilirubin, Direct 0.1 - 0.5 mg/dL - - -   Micro Results: Recent Results (from the past 240  hour(s))  C difficile quick scan w PCR reflex     Status: None   Collection Time: 09/06/17  8:47 PM  Result Value Ref Range Status   C Diff antigen NEGATIVE NEGATIVE Final   C Diff toxin NEGATIVE NEGATIVE Final   C Diff interpretation No C. difficile detected.  Final    Comment: Performed at [facility], [address] [place], [place] [address]  Gastrointestinal Panel by PCR , Stool     Status: None   Collection Time: 09/06/17  8:47 PM  Result Value Ref Range Status   Campylobacter species NOT DETECTED NOT DETECTED Final   Plesimonas shigelloides NOT DETECTED NOT DETECTED Final   Salmonella species NOT DETECTED NOT DETECTED Final   Yersinia enterocolitica NOT DETECTED NOT DETECTED Final   Vibrio species NOT DETECTED NOT DETECTED Final   Vibrio cholerae NOT DETECTED NOT DETECTED Final   Enteroaggregative E coli (EAEC) NOT DETECTED NOT DETECTED Final   Enteropathogenic E coli (EPEC) NOT DETECTED NOT DETECTED Final   Enterotoxigenic E coli (ETEC) NOT DETECTED NOT DETECTED Final   Shiga like toxin producing E coli (  STEC) NOT DETECTED NOT DETECTED Final   Shigella/Enteroinvasive E coli (EIEC) NOT DETECTED NOT DETECTED Final   Cryptosporidium NOT DETECTED NOT DETECTED Final   Cyclospora cayetanensis NOT DETECTED NOT DETECTED Final   Entamoeba histolytica NOT DETECTED NOT DETECTED Final   Giardia lamblia NOT DETECTED NOT DETECTED Final   Adenovirus F40/41 NOT DETECTED NOT DETECTED Final   Astrovirus NOT DETECTED NOT DETECTED Final   Norovirus GI/GII NOT DETECTED NOT DETECTED Final   Rotavirus A NOT DETECTED NOT DETECTED Final   Sapovirus (I, II, IV, and V) NOT DETECTED NOT DETECTED Final    Comment: Performed at [facility], [address] [place], [place] [address]  Calprotectin, Fecal     Status: None   Collection Time: 09/12/17  3:55 PM  Result Value Ref Range Status   Calprotectin, Fecal 71 0 - 120 ug/g Final    Comment: (NOTE) Concentration      Interpretation   Follow-Up <16 - 50 ug/g     Normal           None >50 -120 ug/g     Borderline       Re-evaluate in 4-6 weeks    >120 ug/g     Abnormal         Repeat as clinically                                   indicated Performed At: [facility] [place], [place] [id number] Rush Farmer MD KP:[id number]    Studies/Results: No results found. Medications: I have reviewed the patient's current medications. Scheduled Meds: . benztropine  0.5 mg Oral QHS  . busPIRone  5 mg Oral BID  . enoxaparin (LOVENOX) injection  40 mg Subcutaneous Q24H  . escitalopram  10 mg Oral Daily  . feeding supplement  1 Container Oral TID BM  . folic acid  1 mg Oral Daily  . folic acid  2 mg Oral Once  . insulin aspart  0-5 Units Subcutaneous QHS  . insulin aspart  0-9 Units Subcutaneous TID WC  . mesalamine  4 g Rectal Once   Continuous Infusions: . ciprofloxacin Stopped (09/15/17 0132)  . metronidazole Stopped (09/15/17 0132)   PRN Meds:.acetaminophen **OR** acetaminophen, albuterol, HYDROcodone-acetaminophen, hydrOXYzine, morphine injection, ondansetron **OR** ondansetron (ZOFRAN) IV   Assessment: Active Problems:   Exacerbation of Crohn's disease ([place])   Crohn's disease of colon with complication ([facility])  Postoperative recurrence of Crohn's disease, ileocolonic with anastomotic stricture, Rutgeert's i4, on weekly methotrexate + daily folate, Remicade every 8 weeks admitted with flare up of Crohn's disease. Stool studies have been negative for infectious etiology Patient underwent colonoscopy on 09/14/17 which revealed moderate inflammation in the rectum and rectosigmoid colon, however with significant endoscopic response to Remicade at the ileocolonic anastomosis, regressed from Rutgeert's i4 to i2 stage. Patient has Infliximab levels in subtherapeutic range and undetectable antibodies  Plan: Ok to discharge home today Continue Cipro and Flagyl 526m po BID We will  arrange for outpt Remicade infusions Continue weekly methotrexate 25 MG by mouth and daily folate Start rowasa enema daily for 2weeks  F/u in 2-3weeks   LOS: 5 days   Leroy Hernandez 09/15/2017, 11:40 AM

## 2017-09-15 NOTE — Progress Notes (Signed)
Pharmacy Electrolyte Monitoring Consult:  Pharmacy consulted to assist in monitoring and replacing electrolytes in this1 year old male admitted for exacerbation of Crohn's disease   Labs: 8/8 K 3.4  Assessment/Plan: Will replace potassium 18mq PO x 1 dose today.  Will recheck electrolytes with am labs.  Pharmacy will continue to monitor and adjust per consult.  ATawnya Crook PharmD Pharmacy Resident  09/15/2017 10:11 AM

## 2017-09-15 NOTE — Discharge Instructions (Signed)
Follow-up with primary care physician in 2 to 3 days Follow-up with gastroenterology Dr. [name] in 2 to 3 weeks

## 2017-09-15 NOTE — Discharge Summary (Addendum)
[place] [facility] [place] NAME: Leroy Hernandez    MR#:  [id number]  DATE OF BIRTH:  Jun 07, 1985  DATE OF ADMISSION:  09/10/2017 ADMITTING PHYSICIAN: Demetrios Loll, MD  DATE OF DISCHARGE:  09/15/17  PRIMARY CARE PHYSICIAN: [name], MD    ADMISSION DIAGNOSIS:  Crohn's disease of colon with complication ([place]) [K91.791]  DISCHARGE DIAGNOSIS:  Active Problems:   Exacerbation of Crohn's disease ([place])   Crohn's disease of colon with complication ([place])   SECONDARY DIAGNOSIS:   Past Medical History:  Diagnosis Date  . Anxiety   . Asthma   . Crohn's disease ([place])   . Depression   . Rectal fistula     HOSPITAL COURSE:   HPI  Leroy Hernandez  is a 32 y.o. male with a known history of Crohn's disease, rectal fistula, asthma, anxiety and depression.  The patient presented to the ED with above chief complaints.  Abdominal pain is in the middle part of the abdomen, tenderness without radiation.  He also complains of nausea, vomiting several times and diarrhea with bloody stool.  CAT scan of abdomen and pelvis shows New inflammatory changes involving a colonic anastomosis in the right mid abdomen in this patient status post right hemicolectomy.  Dr. Vicente Males suggest admit patient and start IV hydrocortisone.   .  Abdominal pain- suspected to be secondary to flareup of his Crohn's disease.   Patient had a CT abdomen pelvis which showed New inflammatory changes involving a colonic anastomosis in the right mid abdomen in this patient status post right hemicolectomy. -Seen by gastroenterology and will continue IV steroids   Hydrocortisone changed to Solu-Medrol  -Colonoscopy-09/14/2017 has revealed moderate inflammation in the rectum and rectosigmoid colon,however with significant endoscopic response to Remicade at the ileocolonic anastomosis, regressed from Rutgeert's i4 to i2 stage.  -GI has ordered fecal calprotectin,  3.1 -CRP -Patient is on Cipro  and Flagyl, complete the course for a total of 14 days started on August 5 -We will discharge with methotrexate 25 mg weekly and daily folate -Rowasa enema daily for 2 weeks -Continue supportive care , PRN pain control. -Patient has been on Remicade infusions in the past.  GI is following Remicade antibodies and drug levels -Outpatient follow-up with gastroenterology Dr. [name] in 2 to 3 weeks  2.  Anxiety/depression-continue BuSpar, Lexapro.  3. fatique - OOB as tolerated   All the records are reviewed and case discussed with Care Management/Social Worker. Management plans discussed with the patient, family and they are in agreement.  CODE STATUS: Full code  DVT Prophylaxis: Ambulatory  DISCHARGE CONDITIONS:   fair  CONSULTS OBTAINED:  Treatment Team:  [name], MD   PROCEDURES colonoscopy  DRUG ALLERGIES:  No Known Allergies  DISCHARGE MEDICATIONS:   Allergies as of 09/15/2017   No Known Allergies     Medication List    STOP taking these medications   ARIPiprazole 5 MG tablet Commonly known as:  ABILIFY   budesonide 3 MG 24 hr capsule Commonly known as:  ENTOCORT EC   diphenoxylate-atropine 2.5-0.025 MG tablet Commonly known as:  LOMOTIL   inFLIXimab 100 MG injection Commonly known as:  REMICADE   ondansetron 4 MG tablet Commonly known as:  ZOFRAN   potassium chloride SA 20 MEQ tablet Commonly known as:  K-DUR,KLOR-CON   predniSONE 10 MG tablet Commonly known as:  DELTASONE     TAKE these medications   acetaminophen 325 MG tablet Commonly known as:  TYLENOL Take 2 tablets (650 mg total) by mouth every 6 (six) hours as needed for mild pain (or Fever >/= 101).   benztropine 0.5 MG tablet Commonly known as:  COGENTIN Take 1 tablet (0.5 mg total) by mouth at bedtime.   busPIRone 5 MG tablet Commonly known as:  BUSPAR Take 1 tablet (5 mg total) by mouth 2 (two) times daily.   ciprofloxacin 500 MG tablet Commonly known as:  CIPRO Take 1  tablet (500 mg total) by mouth 2 (two) times daily for 10 days.   escitalopram 10 MG tablet Commonly known as:  LEXAPRO Take 1 tablet (10 mg total) by mouth daily.   folic acid 1 MG tablet Commonly known as:  FOLVITE TAKE 1 TABLET BY MOUTH EVERY DAY. TAKE 2 TABLETS ON DAY WHEN YOU TAKE METHOTREXATE   hydrOXYzine 25 MG capsule Commonly known as:  VISTARIL Take 1 capsule (25 mg total) by mouth daily as needed (severe anxiety attacks).   mesalamine 4 g enema Commonly known as:  ROWASA Place 60 mLs (4 g total) rectally at bedtime.   methotrexate 2.5 MG tablet Commonly known as:  RHEUMATREX Take 10 tablets by mouth once weekly for 3 weeks Caution:Chemotherapy. Protect from light. Follow-up with gastroenterology for more recommendations What changed:  additional instructions   metroNIDAZOLE 500 MG tablet Commonly known as:  FLAGYL Take 1 tablet (500 mg total) by mouth every 12 (twelve) hours for 10 days. What changed:  when to take this   oxyCODONE 5 MG immediate release tablet Commonly known as:  Oxy IR/ROXICODONE Take 1 tablet (5 mg total) by mouth every 6 (six) hours as needed for moderate pain or breakthrough pain.        DISCHARGE INSTRUCTIONS:  Follow-up with primary care physician in 2 to 3 days Follow-up with gastroenterology Dr. [name] in 2 to 3 weeks   DIET:  Regular diet  DISCHARGE CONDITION:  Fair  ACTIVITY:  Activity as tolerated  OXYGEN:  Home Oxygen: No.   Oxygen Delivery: room air  DISCHARGE LOCATION:  home   If you experience worsening of your admission symptoms, develop shortness of breath, life threatening emergency, suicidal or homicidal thoughts you must seek medical attention immediately by calling 911 or calling your MD immediately  if symptoms less severe.  You Must read complete instructions/literature along with all the possible adverse reactions/side effects for all the Medicines you take and that have been prescribed to you. Take any  new Medicines after you have completely understood and accpet all the possible adverse reactions/side effects.   Please note  You were cared for by a hospitalist during your hospital stay. If you have any questions about your discharge medications or the care you received while you were in the hospital after you are discharged, you can call the unit and asked to speak with the hospitalist on call if the hospitalist that took care of you is not available. Once you are discharged, your primary care physician will handle any further medical issues. Please note that NO REFILLS for any discharge medications will be authorized once you are discharged, as it is imperative that you return to your primary care physician (or establish a relationship with a primary care physician if you do not have one) for your aftercare needs so that they can reassess your need for medications and monitor your lab values.     Today  Chief Complaint  Patient presents with  . Abdominal Pain   Patient is feeling much  better.  Abdominal pain significantly improved and okay to discharge patient from GI standpoint.  ROS:  CONSTITUTIONAL: Denies fevers, chills. Denies any fatigue, weakness.  EYES: Denies blurry vision, double vision, eye pain. EARS, NOSE, THROAT: Denies tinnitus, ear pain, hearing loss. RESPIRATORY: Denies cough, wheeze, shortness of breath.  CARDIOVASCULAR: Denies chest pain, palpitations, edema.  GASTROINTESTINAL: Denies nausea, vomiting, diarrhea, abdominal pain. Denies bright red blood per rectum. GENITOURINARY: Denies dysuria, hematuria. ENDOCRINE: Denies nocturia or thyroid problems. HEMATOLOGIC AND LYMPHATIC: Denies easy bruising or bleeding. SKIN: Denies rash or lesion. MUSCULOSKELETAL: Denies pain in neck, back, shoulder, knees, hips or arthritic symptoms.  NEUROLOGIC: Denies paralysis, paresthesias.  PSYCHIATRIC: Denies anxiety or depressive symptoms.   VITAL SIGNS:  Blood pressure 113/73,  pulse 60, temperature 98 F (36.7 C), temperature source Oral, resp. rate 18, height 5' 5"  (1.651 m), weight 57.3 kg, SpO2 100 %.  I/O:    Intake/Output Summary (Last 24 hours) at 09/15/2017 1412 Last data filed at 09/15/2017 1023 Gross per 24 hour  Intake 680 ml  Output 950 ml  Net -270 ml    PHYSICAL EXAMINATION:  GENERAL:  32 y.o.-year-old patient lying in the bed with no acute distress.  EYES: Pupils equal, round, reactive to light and accommodation. No scleral icterus. Extraocular muscles intact.  HEENT: Head atraumatic, normocephalic. Oropharynx and nasopharynx clear.  NECK:  Supple, no jugular venous distention. No thyroid enlargement, no tenderness.  LUNGS: Normal breath sounds bilaterally, no wheezing, rales,rhonchi or crepitation. No use of accessory muscles of respiration.  CARDIOVASCULAR: S1, S2 normal. No murmurs, rubs, or gallops.  ABDOMEN: Soft, non-tender, non-distended. Bowel sounds present. No organomegaly or mass.  EXTREMITIES: No pedal edema, cyanosis, or clubbing.  NEUROLOGIC: Cranial nerves II through XII are intact. Muscle strength 5/5 in all extremities. Sensation intact. Gait not checked.  PSYCHIATRIC: The patient is alert and oriented x 3.  SKIN: No obvious rash, lesion, or ulcer.   DATA REVIEW:   CBC Recent Labs  Lab 09/15/17 0434  WBC 9.9  HGB 12.5*  HCT 36.4*  PLT 260    Chemistries  Recent Labs  Lab 09/10/17 1649  09/13/17 0450 09/15/17 0434  NA 134*   < > 140 139  K 3.7   < > 3.8 3.4*  CL 92*   < > 107 104  CO2 30   < > 29 29  GLUCOSE 214*   < > 141* 138*  BUN 12   < > 7 12  CREATININE 1.17   < > 0.67 0.68  CALCIUM 9.0   < > 8.0* 7.7*  MG  --   --  2.1  --   AST 16  --   --   --   ALT 16  --   --   --   ALKPHOS 76  --   --   --   BILITOT 2.0*  --   --   --    < > = values in this interval not displayed.    Cardiac Enzymes No results for input(s): TROPONINI in the last 168 hours.  Microbiology Results  Results for orders  placed or performed during the hospital encounter of 09/10/17  Calprotectin, Fecal     Status: None   Collection Time: 09/12/17  3:55 PM  Result Value Ref Range Status   Calprotectin, Fecal 71 0 - 120 ug/g Final    Comment: (NOTE) Concentration     Interpretation   Follow-Up <16 - 50 ug/g  Normal           None >50 -120 ug/g     Borderline       Re-evaluate in 4-6 weeks    >120 ug/g     Abnormal         Repeat as clinically                                   indicated Performed At: [facility] [place], [place] [id number] Rush Farmer MD VG:[id number]     RADIOLOGY:  No results found.  EKG:   Orders placed or performed during the hospital encounter of 04/30/17  . ED EKG  . ED EKG  . EKG      Management plans discussed with the patient, family and they are in agreement.  CODE STATUS:     Code Status Orders  (From admission, onward)         Start     Ordered   09/11/17 0008  Full code  Continuous     09/11/17 0008        Code Status History    Date Active Date Inactive Code Status Order ID Comments User Context   04/30/2017 2003 05/01/2017 2058 Full Code [id number]  Saundra Shelling, MD Inpatient   06/22/2016 2338 06/25/2016 1712 Full Code [id number]  Hugelmeyer, Ubaldo Glassing, DO Inpatient      TOTAL TIME TAKING CARE OF THIS PATIENT: 43  minutes.   Note: This dictation was prepared with Dragon dictation along with smaller phrase technology. Any transcriptional errors that result from this process are unintentional.   @M[facility] @  on 09/15/2017 at 2:12 PM  Between 7am to 6pm - Pager - [phone]  After 6pm go to [email or web address] - password [email or web address] [facility]  [facility]  Office  [phone]  CC: Primary care physician; [name], MD

## 2017-09-16 ENCOUNTER — Ambulatory Visit: Payer: BC Managed Care – PPO | Admitting: Psychiatry

## 2017-09-16 LAB — SURGICAL PATHOLOGY

## 2017-09-19 ENCOUNTER — Ambulatory Visit (INDEPENDENT_AMBULATORY_CARE_PROVIDER_SITE_OTHER): Payer: BC Managed Care – PPO | Admitting: Endocrinology

## 2017-09-19 ENCOUNTER — Encounter: Payer: Self-pay | Admitting: Endocrinology

## 2017-09-19 VITALS — BP 96/60 | HR 72 | Ht 65.0 in | Wt 136.0 lb

## 2017-09-19 DIAGNOSIS — M810 Age-related osteoporosis without current pathological fracture: Secondary | ICD-10-CM | POA: Diagnosis not present

## 2017-09-19 LAB — VITAMIN D 25 HYDROXY (VIT D DEFICIENCY, FRACTURES): VITD: 18.7 ng/mL — AB (ref 30.00–100.00)

## 2017-09-19 MED ORDER — ERGOCALCIFEROL 1.25 MG (50000 UT) PO CAPS: 50000.0000 [IU] | ORAL_CAPSULE | ORAL | 1 refills | Status: AC

## 2017-09-19 NOTE — Patient Instructions (Addendum)
I have sent a prescription to your pharmacy, to resume the vitamin-D Please redo the blood tests in 4 weeks.   When the vitamin-D is normal, we'll recheck the bone density test You probably need the vitamin-D long-term.   Please come back for a follow-up appointment in 5 months.

## 2017-09-19 NOTE — Progress Notes (Signed)
Subjective:    Patient ID: Leroy Hernandez, male    DOB: 04/27/1985, 32 y.o.   MRN: [id number]  HPI Pt returns for f/u of osteoporosis: Dx'ed: 2018 Secondary causes: vit-D deficiency and intermitt steroids (both due to Crohn's) Fractures: right wrist (2000), right little finger (1995), and left hand (2018).   Past rx: none Current rx: ergocalciferol.  Last DEXA result: (2018): right Femur Neck (worst site): Z-score of -3.3.   Other: plan is to first normalize vit-D. Interval hx: He stopped ergocalciferol 2 mos ago.  No further fractures.  pt states he feels well in general.  He has slight cramps of the hands, but no assoc numbness.   Past Medical History:  Diagnosis Date  . Anxiety   . Asthma   . Crohn's disease ([place])   . Depression   . Rectal fistula     Past Surgical History:  Procedure Laterality Date  . COLON RESECTION    . COLON SURGERY    . COLONOSCOPY WITH PROPOFOL N/A 04/03/2015   Procedure: COLONOSCOPY WITH PROPOFOL;  Surgeon: Hulen Luster, MD;  Location: [facility] ENDOSCOPY;  Service: Endoscopy;  Laterality: N/A;  . COLONOSCOPY WITH PROPOFOL N/A 12/22/2015   Procedure: COLONOSCOPY WITH PROPOFOL;  Surgeon: [name], MD;  Location: [place];  Service: Endoscopy;  Laterality: N/A;  . COLONOSCOPY WITH PROPOFOL N/A 12/22/2016   Procedure: COLONOSCOPY WITH PROPOFOL;  Surgeon: Lin Landsman, MD;  Location: [place];  Service: Endoscopy;  Laterality: N/A;  . COLONOSCOPY WITH PROPOFOL N/A 09/14/2017   Procedure: COLONOSCOPY WITH PROPOFOL;  Surgeon: Lin Landsman, MD;  Location: [facility] ENDOSCOPY;  Service: Gastroenterology;  Laterality: N/A;  . ESOPHAGOGASTRODUODENOSCOPY (EGD) WITH PROPOFOL N/A 12/22/2016   Procedure: ESOPHAGOGASTRODUODENOSCOPY (EGD) WITH PROPOFOL;  Surgeon: Lin Landsman, MD;  Location: [place];  Service: Endoscopy;  Laterality: N/A;  . rectal fistula surgery    . WRIST SURGERY      Social History   Socioeconomic  History  . Marital status: Legally Separated    Spouse name: Not on file  . Number of children: 0  . Years of education: Not on file  . Highest education level: Associate degree: occupational, Hotel manager, or vocational program  Occupational History    Employer: [employer]  Social Needs  . Financial resource strain: Not hard at all  . Food insecurity:    Worry: Never true    Inability: Never true  . Transportation needs:    Medical: No    Non-medical: No  Tobacco Use  . Smoking status: Never Smoker  . Smokeless tobacco: Former Network engineer and Sexual Activity  . Alcohol use: Yes    Frequency: Never  . Drug use: No    Comment: hx of 3 year ago   . Sexual activity: Yes  Lifestyle  . Physical activity:    Days per week: 0 days    Minutes per session: 0 min  . Stress: Not at all  Relationships  . Social connections:    Talks on phone: More than three times a week    Gets together: Once a week    Attends religious service: Never    Active member of club or organization: No    Attends meetings of clubs or organizations: Never    Relationship status: Separated  . Intimate partner violence:    Fear of current or ex partner: No    Emotionally abused: No    Physically abused: No  Forced sexual activity: No  Other Topics Concern  . Not on file  Social History Narrative  . Not on file    Current Outpatient Medications on File Prior to Visit  Medication Sig Dispense Refill  . acetaminophen (TYLENOL) 325 MG tablet Take 2 tablets (650 mg total) by mouth every 6 (six) hours as needed for mild pain (or Fever >/= 101).    . benztropine (COGENTIN) 0.5 MG tablet Take 1 tablet (0.5 mg total) by mouth at bedtime. 90 tablet 1  . busPIRone (BUSPAR) 5 MG tablet Take 1 tablet (5 mg total) by mouth 2 (two) times daily. 180 tablet 1  . ciprofloxacin (CIPRO) 500 MG tablet Take 1 tablet (500 mg total) by mouth 2 (two) times daily for 10 days. 20 tablet 0  . escitalopram (LEXAPRO)  10 MG tablet Take 1 tablet (10 mg total) by mouth daily. 90 tablet 1  . folic acid (FOLVITE) 1 MG tablet TAKE 1 TABLET BY MOUTH EVERY DAY. TAKE 2 TABLETS ON DAY WHEN YOU TAKE METHOTREXATE 90 tablet 0  . hydrOXYzine (VISTARIL) 25 MG capsule Take 1 capsule (25 mg total) by mouth daily as needed (severe anxiety attacks). 90 capsule 1  . mesalamine (ROWASA) 4 g enema Place 60 mLs (4 g total) rectally at bedtime. 14 Bottle 0  . methotrexate (RHEUMATREX) 2.5 MG tablet Take 10 tablets by mouth once weekly for 3 weeks Caution:Chemotherapy. Protect from light. Follow-up with gastroenterology for more recommendations 30 tablet 0  . metroNIDAZOLE (FLAGYL) 500 MG tablet Take 1 tablet (500 mg total) by mouth every 12 (twelve) hours for 10 days. 20 tablet 0  . oxyCODONE (OXY IR/ROXICODONE) 5 MG immediate release tablet Take 1 tablet (5 mg total) by mouth every 6 (six) hours as needed for moderate pain or breakthrough pain. 20 tablet 0   No current facility-administered medications on file prior to visit.     No Known Allergies  Family History  Problem Relation Age of Onset  . Diabetes Paternal Grandfather   . Heart disease Paternal Grandfather   . Depression Mother   . Drug abuse Mother   . Osteoporosis Neg Hx     BP 96/60 (BP Location: Left Arm, Patient Position: Sitting, Cuff Size: Normal)   Pulse 72   Ht 5' 5"  (1.651 m)   Wt 136 lb (61.7 kg)   BMI 22.63 kg/m    Review of Systems Denies falls and LOC.      Objective:   Physical Exam VITAL SIGNS:  See vs page GENERAL: no distress Chest wall: no kyphosis   Lab Results  Component Value Date   PTH 26 05/17/2017   CALCIUM 7.7 (L) 09/15/2017   PHOS 3.7 06/23/2016       Assessment & Plan:  Vit-D def: due to Crohn's Dz Hypocalcemia: prob due to the vit-D def: worse Osteoporosis: we'll recheck when vit-D def is better  Patient Instructions  I have sent a prescription to your pharmacy, to resume the vitamin-D Please redo the blood  tests in 4 weeks.   When the vitamin-D is normal, we'll recheck the bone density test You probably need the vitamin-D long-term.   Please come back for a follow-up appointment in 5 months.

## 2017-09-20 ENCOUNTER — Ambulatory Visit: Payer: BC Managed Care – PPO | Admitting: Gastroenterology

## 2017-09-20 ENCOUNTER — Ambulatory Visit: Payer: BC Managed Care – PPO | Admitting: Licensed Clinical Social Worker

## 2017-09-21 LAB — PTH, INTACT AND CALCIUM
Calcium: 9 mg/dL (ref 8.6–10.3)
PTH: 39 pg/mL (ref 14–64)

## 2017-09-22 ENCOUNTER — Ambulatory Visit: Payer: Self-pay | Admitting: Psychiatry

## 2017-09-29 NOTE — Anesthesia Postprocedure Evaluation (Signed)
Anesthesia Post Note  Patient: Leroy Hernandez  Procedure(s) Performed: COLONOSCOPY WITH PROPOFOL (N/A )  Patient location during evaluation: PACU Anesthesia Type: General Level of consciousness: awake and alert Pain management: pain level controlled Vital Signs Assessment: post-procedure vital signs reviewed and stable Respiratory status: spontaneous breathing, nonlabored ventilation, respiratory function stable and patient connected to nasal cannula oxygen Cardiovascular status: blood pressure returned to baseline and stable Postop Assessment: no apparent nausea or vomiting Anesthetic complications: no     Last Vitals:  Vitals:   09/15/17 1142 09/15/17 1540  BP: 113/73 119/76  Pulse: 60 76  Resp: 18 17  Temp: 36.7 C 36.8 C  SpO2: 100% 100%    Last Pain:  Vitals:   09/15/17 1540  TempSrc: Oral  PainSc:                  Molli Barrows

## 2017-10-04 ENCOUNTER — Telehealth: Payer: Self-pay | Admitting: Gastroenterology

## 2017-10-04 NOTE — Telephone Encounter (Signed)
Pt girlfriend left vm  In regards to pt fusion  Please call her

## 2017-10-04 NOTE — Telephone Encounter (Signed)
Spoke with patient and confirmed date for next infusion at [facility] rheumatology

## 2017-10-10 ENCOUNTER — Other Ambulatory Visit: Payer: Self-pay | Admitting: Endocrinology

## 2017-10-10 NOTE — Telephone Encounter (Signed)
please call patient: I received refill request for ergocalciferol.  Before refilling, we need to know what your level is now.  Please come in to have drawn.

## 2017-10-12 ENCOUNTER — Ambulatory Visit (INDEPENDENT_AMBULATORY_CARE_PROVIDER_SITE_OTHER): Payer: BC Managed Care – PPO | Admitting: Licensed Clinical Social Worker

## 2017-10-12 DIAGNOSIS — F331 Major depressive disorder, recurrent, moderate: Secondary | ICD-10-CM | POA: Diagnosis not present

## 2017-10-12 DIAGNOSIS — F411 Generalized anxiety disorder: Secondary | ICD-10-CM | POA: Diagnosis not present

## 2017-10-18 ENCOUNTER — Encounter: Payer: Self-pay | Admitting: Psychiatry

## 2017-10-18 ENCOUNTER — Other Ambulatory Visit: Payer: Self-pay

## 2017-10-18 ENCOUNTER — Ambulatory Visit (INDEPENDENT_AMBULATORY_CARE_PROVIDER_SITE_OTHER): Payer: BC Managed Care – PPO | Admitting: Psychiatry

## 2017-10-18 VITALS — BP 112/73 | HR 87 | Temp 98.9°F | Wt 140.2 lb

## 2017-10-18 DIAGNOSIS — F3341 Major depressive disorder, recurrent, in partial remission: Secondary | ICD-10-CM | POA: Diagnosis not present

## 2017-10-18 DIAGNOSIS — G2402 Drug induced acute dystonia: Secondary | ICD-10-CM

## 2017-10-18 DIAGNOSIS — F411 Generalized anxiety disorder: Secondary | ICD-10-CM | POA: Diagnosis not present

## 2017-10-18 NOTE — Progress Notes (Signed)
[place] [facility] MD OP Progress Note  10/18/2017 9:40 AM Leroy Hernandez  MRN:  [id number]  Chief Complaint: ' I am here for follow up.' Chief Complaint    Follow-up; Medication Refill     HPI: Leroy Hernandez is a 32 year old Caucasian male, employed, lives in [place], has a history of anxiety, depression, Crohn's disease, presented to the clinic today for a follow-up visit.  Patient today reports his depression and anxiety symptoms are improved on the current medication regimen.  He reports his girlfriend is back home and they have a better relationship right now.  He does have some on and off irritability however they are very mild.  Patient reports work is going well.  Patient reports sleep as better and sometimes as excessive.  He however reports he was admitted to the hospital for Crohn's disease flare up for a week.  It is likely that his tiredness and excessive sleep may be related to his Crohn's disease flare up.  Patient reports he is tolerating the medications well with no side effects.  He denies any rigidity stiffness or tremors from the Abilify.  He reports he currently does not see the therapist since he and his therapist decided that he does not need the sessions anymore.  He however reports he will restart therapy if he needs it in future.  Visit Diagnosis:    ICD-10-CM   1. Major depressive disorder, recurrent episode, in partial remission ([place]) F33.41   2. GAD (generalized anxiety disorder) F41.1    improving  3. 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  Past trials of Ativan.  Past Medical History:  Past Medical History:  Diagnosis Date  . Anxiety   . Asthma   . Crohn's disease ([place])   . Depression   . Rectal fistula     Past Surgical History:  Procedure Laterality Date  . COLON RESECTION    . COLON SURGERY    . COLONOSCOPY WITH PROPOFOL N/A 04/03/2015   Procedure: COLONOSCOPY  WITH PROPOFOL;  Surgeon: Hulen Luster, MD;  [facility]COPY;  Service: Endoscopy;  Laterality: N/A;  . COLONOSCOPY WITH PROPOFOL N/A 12/22/2015   Procedure: COLONOSCOPY WITH PROPOFOL;  Surgeon: [name], MD;  Location: [place];  Service: Endoscopy;  Laterality: N/A;  . COLONOSCOPY WITH PROPOFOL N/A 12/22/2016   Procedure: COLONOSCOPY WITH PROPOFOL;  Surgeon: Lin Landsman, MD;  Location: [place];  Service: Endoscopy;  Laterality: N/A;  . COLONOSCOPY WITH PROPOFOL N/A 09/14/2017   Procedure: COLONOSCOPY WITH PROPOFOL;  Surgeon: Lin Landsman, MD;  Location[facility] ENDOSCOPY;  Service: Gastroenterology;  Laterality: N/A;  . ESOPHAGOGASTRODUODENOSCOPY (EGD) WITH PROPOFOL N/A 12/22/2016   Procedure: ESOPHAGOGASTRODUODENOSCOPY (EGD) WITH PROPOFOL;  Surgeon: Lin Landsman, MD;  Location: [place];  Service: Endoscopy;  Laterality: N/A;  . rectal fistula surgery    . WRIST SURGERY      Family Psychiatric History: Reviewed family psychiatric history from my progress note on 03/22/2017  Family History:  Family History  Problem Relation Age of Onset  . Diabetes Paternal Grandfather   . Heart disease Paternal Grandfather   . Depression Mother   . Drug abuse Mother   . Osteoporosis Neg Hx     Social History: Have reviewed social history from my progress note on 03/22/2017 Social History   Socioeconomic History  . Marital status: Legally Separated    Spouse name: Not  on file  . Number of children: 0  . Years of education: Not on file  . Highest education level: Associate degree: occupational, Hotel manager, or vocational program  Occupational History    Employer: [employer]  Social Needs  . Financial resource strain: Not hard at all  . Food insecurity:    Worry: Never true    Inability: Never true  . Transportation needs:    Medical: No    Non-medical: No  Tobacco Use  . Smoking status: Never Smoker  . Smokeless tobacco:  Former Network engineer and Sexual Activity  . Alcohol use: Yes    Frequency: Never  . Drug use: No    Comment: hx of 3 year ago   . Sexual activity: Yes  Lifestyle  . Physical activity:    Days per week: 0 days    Minutes per session: 0 min  . Stress: Not at all  Relationships  . Social connections:    Talks on phone: More than three times a week    Gets together: Once a week    Attends religious service: Never    Active member of club or organization: No    Attends meetings of clubs or organizations: Never    Relationship status: Separated  Other Topics Concern  . Not on file  Social History Narrative  . Not on file    Allergies: No Known Allergies  Metabolic Disorder Labs: Lab Results  Component Value Date   HGBA1C 5.0 03/22/2017   MPG 96.8 03/22/2017   Lab Results  Component Value Date   PROLACTIN 9.4 03/22/2017   Lab Results  Component Value Date   CHOL 130 03/22/2017   TRIG 72 03/22/2017   HDL 47 03/22/2017   CHOLHDL 2.8 03/22/2017   VLDL 14 03/22/2017   LDLCALC 69 03/22/2017   Lab Results  Component Value Date   TSH 1.257 03/22/2017   TSH 1.80 12/09/2016    Therapeutic Level Labs: No results found for: LITHIUM No results found for: VALPROATE No components found for:  CBMZ  Current Medications: Current Outpatient Medications  Medication Sig Dispense Refill  . acetaminophen (TYLENOL) 325 MG tablet Take 2 tablets (650 mg total) by mouth every 6 (six) hours as needed for mild pain (or Fever >/= 101).    . benztropine (COGENTIN) 0.5 MG tablet Take 1 tablet (0.5 mg total) by mouth at bedtime. 90 tablet 1  . busPIRone (BUSPAR) 5 MG tablet Take 1 tablet (5 mg total) by mouth 2 (two) times daily. 180 tablet 1  . ergocalciferol (VITAMIN D2) 50000 units capsule Take 1 capsule (50,000 Units total) by mouth 3 (three) times a week. 12 capsule 1  . escitalopram (LEXAPRO) 10 MG tablet Take 1 tablet (10 mg total) by mouth daily. 90 tablet 1  . folic acid  (FOLVITE) 1 MG tablet TAKE 1 TABLET BY MOUTH EVERY DAY. TAKE 2 TABLETS ON DAY WHEN YOU TAKE METHOTREXATE 90 tablet 0  . hydrOXYzine (VISTARIL) 25 MG capsule Take 1 capsule (25 mg total) by mouth daily as needed (severe anxiety attacks). 90 capsule 1  . mesalamine (ROWASA) 4 g enema Place 60 mLs (4 g total) rectally at bedtime. 14 Bottle 0  . methotrexate (RHEUMATREX) 2.5 MG tablet Take 10 tablets by mouth once weekly for 3 weeks Caution:Chemotherapy. Protect from light. Follow-up with gastroenterology for more recommendations 30 tablet 0  . oxyCODONE (OXY IR/ROXICODONE) 5 MG immediate release tablet Take 1 tablet (5 mg total) by mouth  every 6 (six) hours as needed for moderate pain or breakthrough pain. 20 tablet 0   No current facility-administered medications for this visit.      Musculoskeletal: Strength & Muscle Tone: within normal limits Gait & Station: normal Patient leans: N/A  Psychiatric Specialty Exam: Review of Systems  Psychiatric/Behavioral: Positive for depression (improved). The patient is nervous/anxious (improving).   All other systems reviewed and are negative.   Blood pressure 112/73, pulse 87, temperature 98.9 F (37.2 C), temperature source Oral, weight 140 lb 3.2 oz (63.6 kg).Body mass index is 23.33 kg/m.  General Appearance: Casual  Eye Contact:  Fair  Speech:  Clear and Coherent  Volume:  Normal  Mood:  Anxious improving  Affect:  Congruent  Thought Process:  Goal Directed and Descriptions of Associations: Intact  Orientation:  Full (Time, Place, and Person)  Thought Content: Logical   Suicidal Thoughts:  No  Homicidal Thoughts:  No  Memory:  Immediate;   Fair Recent;   Fair Remote;   Fair  Judgement:  Fair  Insight:  Fair  Psychomotor Activity:  Normal  Concentration:  Concentration: Fair and Attention Span: Fair  Recall:  [employer] of Knowledge: Fair  Language: Fair  Akathisia:  No  Handed:  Right  AIMS (if indicated): 0  Assets:   Communication Skills Desire for Improvement Social Support  ADL's:  Intact  Cognition: WNL  Sleep:  Fair   Screenings: AIMS     Office Visit from 06/13/2017 in [facility] Total Score  3       Assessment and Plan: Leroy Hernandez is a 32 year old Caucasian male, employed, lives in [place], has a history of anxiety, depression, intermittent rages, Crohn's disease, presented to the clinic today for a follow-up visit.  Patient reports his girlfriend is currently back with him and they are both working on the relationship.  He reports his mood symptoms is improved.  We will continue medications as noted below.  Plan MDD in partial remission PHQ 9 equals 2 Continue Lexapro 10 mg p.o. daily Continue BuSpar 5 mg p.o. twice daily Abilify 5 mg p.o. daily for mood symptoms Continue Cogentin 0.5 mg p.o. nightly for EPS  Aims equals 0  For anxiety disorder Continue BuSpar and Lexapro as prescribed  For abnormal involuntary movements-resolved Cogentin 0.5 mg p.o. nightly Aims equals 0  Follow-up in clinic in 3 months or sooner if needed.  More than 50 % of the time was spent for psychoeducation and supportive psychotherapy and care coordination.  This note was generated in part or whole with voice recognition software. Voice recognition is usually quite accurate but there are transcription errors that can and very often do occur. I apologize for any typographical errors that were not detected and corrected.       Ursula Alert, MD 10/18/2017, 9:40 AM

## 2017-10-25 ENCOUNTER — Ambulatory Visit: Payer: BC Managed Care – PPO | Admitting: Gastroenterology

## 2017-10-25 NOTE — Progress Notes (Signed)
   THERAPIST PROGRESS NOTE  Session Time: 8mn  Participation Level: Active  Behavioral Response: CasualAlertEuthymic  Type of Therapy: Individual Therapy  Treatment Goals addressed: Coping  Interventions: CBT and Motivational Interviewing  Summary: TABRAHIM SARGENTis a 32y.o. male who presents with reduction in symptoms.  Therapist met with Patient in an outpatient setting to assess current mood and assist with making progress towards goals through the use of therapeutic intervention. Therapist did a brief mood check, assessing anger, fear, disgust, excitement, happiness, and sadness.  Patient reports a "good" mood. Therapist asked Patient what the term "imagery for remaining calm" means.  Therapist asked Patient if he feels he is good at calming down when upset.  Therapist reminded Patient that if he understands his trigger points then they can calm down quicker.  Therapist introduced an activity to assist with remaining calm in given situations.  Therapist explained seeing things that upset you can make you feel tense.  Therapist assisted Patient with making a list of things that make you tense and things that make you calm.  Therapist modeled imagery with Patient several times before he was able to follow along.  Therapist role played with Patient in what type of situations these can be useful in.  Therapist encouraged Patient to stay motivated to meet treatment goals.    Suicidal/Homicidal: No  Plan: Return again in 2 weeks.  Diagnosis: Axis I: Depressive Disorder NOS and Generalized Anxiety Disorder    Axis II: No diagnosis    NLubertha South LCSW 10/12/2017

## 2017-11-28 ENCOUNTER — Other Ambulatory Visit: Payer: Self-pay | Admitting: Gastroenterology

## 2017-11-29 ENCOUNTER — Ambulatory Visit (INDEPENDENT_AMBULATORY_CARE_PROVIDER_SITE_OTHER): Payer: BC Managed Care – PPO | Admitting: Gastroenterology

## 2017-11-29 ENCOUNTER — Encounter: Payer: Self-pay | Admitting: Gastroenterology

## 2017-11-29 VITALS — BP 108/74 | HR 76 | Resp 16 | Ht 65.0 in | Wt 145.8 lb

## 2017-11-29 DIAGNOSIS — K50811 Crohn's disease of both small and large intestine with rectal bleeding: Secondary | ICD-10-CM

## 2017-11-29 DIAGNOSIS — Z23 Encounter for immunization: Secondary | ICD-10-CM

## 2017-11-29 NOTE — Patient Instructions (Signed)
1. Stop entocort 2. Continue remicade 30m/kg every 8weeks 3. Will check remicade trough levels and antibodies 1-2 days prior to next dose 4. F/u in 327month Please call our office to speak with my nurse TeBarnie [phone] business hours from 8am to 4pm if you have any questions/concerns. During after hours, you will be redirected to on call GI physician. For any emergency please call 911 or go the nearest emergency room.    RoCephas DarbyMD [address]SuSpeculatorBuCavaleroNC [id number]: [phone]: [phone]

## 2017-11-29 NOTE — Progress Notes (Signed)
we'll  Leroy Darby, MD [address]  [place]  [place], [place] [address]  Main: [phone]  Fax: [phone]    Gastroenterology Consultation  Referring Provider:     [name], MD Primary Care Physician:  Leroy Ruths, MD Primary Gastroenterologist:  Dr. Cephas Hernandez Reason for Consultation:     Crohn's disease        HPI:   Leroy Hernandez is a 32 y.o. y/o male referred by Dr. Ouida Hernandez, Leroy Cornfield, MD  for consultation & management of Crohn's disease. He has history of ileocolonic Crohn's diagnosed in 2007, detailed history described below is referred by Dr. Allen Hernandez to establish care. He is currently on cymzia 400 mg every 2 weeks and Imuran 150 mg daily. Currently, he is experiencing nonbloody diarrhea up to 10 times a day associated with urgency and perianal purulent discharge from the fistula. He also reports tenderness in the right perianal area. He reports left-sided abdominal pain associated with nausea but no vomiting. He denies bloating, blood in the stools. He went to [facility] 2 days ago due to severe diarrhea and head is given prescription for oral prednisone but he did not filling the prescription yet. He denies fever, chills, loss of appetite, weight loss. He has been adherent with his medications.   Follow up visit 01/10/2017: Since last visit, I started him on entocort, cipro, cortenema for flare up of his symptoms. He gained about 5lbs since last visit. 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 Remicade process was started. He reports some rectal urgency and minimal rectal bleeding mostly on wiping. When I went over medications with him, he doesn't really know what he is taking and he did not bring his medication bottles with him even though I reminded him during last visit. He is not on entocort. He has seen endocrine and also hematology for iron deficiency anemia and scheduled for iron  infusion today  Follow-up visit 05/03/2017 He started on Remicade, completed induction. Stopped Imuran, started on Weekly methotrexate along with daily folate. He reports that he has been tolerating Remicade infusions very well. He gained weight. His stools are more formed, no recurrence of perianal fistulas. He denies rectal bleeding or bloody diarrhea. She recently had flareup of symptoms, was found to have norovirus as well as C. Difficile toxin positive when he went to emergency room on 04/30/2017. E also has significant leukocytosis. Underwent CT scan which did not show acute GI pathology. He was hospitalized for 1 day, discharged on oral vancomycin 125 mg 4 times a day. Patient is here for follow-up. He reports that his diarrhea is improving, currently having 4 semi-formed bowel movements daily without any blood. His appetite is improving.He reports being adherent to the medications. He denies any other complaints today. He reports that his energy levels are better, able to work longer hours.  Follow-up visit 07/20/2017 He went to [facility] ER 2 days ago due to one-week history of severe nonbloody diarrhea, fatigue, chills, nausea. He said he ate spaghetti with chicken and beef about a week ago, started experiencing several episodes of nonbloody diarrhea, particularly postprandial and sometimes at night. He also reports he may have had mild flareup of the perianal fistula drained and currently not draining much. In the ER, he had CBC and CMP which were unremarkable, GI pathogen panel and C. Difficile studies were negative. He is due for next dose of Remicade tomorrow. He  reports taking methotrexate weekly along with folate daily.  Follow up visit 09/09/2017 Leroy Hernandez has not been doing well since last visit. I gave him 4 weeks of prednisone course because of flareup of his GI symptoms including abdominal pain, bloating, urgency, loss of appetite, weight loss. He lost about 7 pounds  since last visit. Stool studies came back negative for infection. He reports taking methotrexate but missed last 2 doses as his fianc misplaced the medicine as they were cleaning up the house for sale. He is due for her next Remicade on August 8. He also reports perianal burning, discomfort, flare up of hemorrhoids, blood on wiping, diarrhea.  He went to ER 3 days ago due to ongoing symptoms. Repeat C. Difficile and GI pathogen panel came back negative  Follow-up visit 11/29/2017 Patient was hospitalized to [facility] in early 09/2017 secondary to flareup of his Crohn's disease.  He underwent colonoscopy which showed improvement in his disease on Remicade 5 mg/kg from i4 to i2.  He received IV steroids, 2 weeks course of Cipro and Flagyl, Rowasa enemas for 2 weeks.  His Remicade trough levels were subtherapeutic, therefore increased to 10 mg/kg.  He is here for follow-up since the discharge.  He received first high-dose Remicade on 11/22/2017. He reports doing very well, in clinical remission since hospital discharge.  He regained 10 pounds in last 2 months, denies abdominal pain, nausea or vomiting.  His appetite has significantly improved.  He reports having 2-3 formed bowel movements daily, without rectal bleeding or rectal pain.  He denies any perianal lesions or discharge.  He continues to take methotrexate 25 mg weekly along with folate daily.  He is also taking Entocort.  He stopped Rowasa enema.  Crohn's disease classification:  Age: 61 to 70 Location: ileocolonic  Behavior: stricturing and penetrating  Perianal: Yes  IBD diagnosis:2007  Disease course: He was diagnosed in 2007, was experiencing right lower quadrant pain and bloating, was on oral mesalamine, several courses of prednisone, underwent first surgery in 2011 of transverse colon resection at [facility]. He continued to have diarrhea and required repeated courses of prednisone, established care at [facility] and underwent  colonoscopy which revealed severe ileocolonic disease, underwent ileocolonic resection in 11/2011. He reports being started on Humira and Imuran since then and was doing well for some time. He was also taking marijuana and it significantly helped with his symptoms. Subsequently, he had frequent flareups, several hospitalizations and ER visits almost every month secondary to perianal fistula, abscess and bloody diarrhea. He was receiving short courses of antibiotics. He had I&D of right perirectal abscesses in May/2017. He does not recall having a seton placement. His colonoscopy in 03/2015 revealed mild active colitis and anastomotic stricture that was dilated with balloon to 12 mm. He underwent another colonoscopy in 12/2015 which revealed inflammation in the neoterminal ileum as well as colon and was found to have moderately active ileocolonic disease. He was then switched to [place] in 05/2016 and stayed on Imuran 150 mg daily. He was feeling better overall but continued to have active perianal disease resulting in urgency and bloody stools. CT A/P from 06/2016 revealed right perirectal fistula with developing fluid collection concerning for abscess. MRE from 09/2016 revealed no evidence of active disease, however the fistula could not be visualized. His CRP improved from 10 in 06/2016 -1.7 in 09/2016. 09/2016 Trial of cipro+flagyl BID and rowasa enema resulted in significant improvement of symptoms. 11/2016 Return of fistula and rectal bleeding after tapering  antibiotic. 12/2016 Restarted cipro, entocort, cortenema, decreased imuran to 40m. EGD and colonoscopy revealed gastric granuloma, Rutgeert's i4. Stool studies negative. Fecal calprotectin 370. C. Difficile infection in 04/2017, treated with oral vancomycin. Flareup of Crohn's since 07/2017, took 4 weeks course of prednisone while on Remicade and methotrexate, mild improvement. Stool studies negative for infection including C. Difficile.  [facility], IV steroids, 2 weeks antibiotics, Rowasa enemas, colonoscopy showed progression of disease from i4 to i2. Remicade trough levels 2.4, undetectable antibodies.  Increased Remicade to 10 mg/kg, first maintenance dose in 11/2017  Extra intestinal manifestations: Imaging revealed chronic sacroiliitis. He denies skin, eye manifestations  IBD surgical history: 2011 : Intestinal resection, transverse colon Diagnosis:  TRANSVERSE COLON AND PROXIMAL TRANSVERSE COLON RESECTION:  SEGMENTS OF BOWEL WITH MODERATE TO FOCALLY SEVERE CHRONIC ACTIVE COLITIS.  EXTENSIVE TRANSMURAL INFLAMMATION.  CRYTPITIS AND CRYPT ABSCESSES ARE NOTED.  THE RESECTION MARGINS APPEAR CLEAR.  THERE IS NO EVIDENCE OF DYSPLASIA OR MALIGNANCY.  NINETEEN BENIGN LYMPH NODES. (0/19)  [facility]- 9 cm TI, 5.5 cm long large bowel Diagnosis:  A:Ileum and colon, resection  -Segment of ileum and colon with moderate to severely active chronic  enteritis, mildly active chronic colitis, and intact anastomosis at distal end  of specimen  -Surgical margins are viable; mildly active chronic colitis is present at  distal margin  -Apparent stricture near ileocecal valve  -Inflammatory polyp near ileocecal valve  -Fibrous obliteration of appendiceal lumen  -Six unremarkable lymph nodes examined microscopically  -No granulomas, viral cytopathic effect, or dysplasia identified   07/04/2015-incision and drainage of right perirectal abscess Imaging:  MRE-09/21/2016 IMPRESSION: 1. Stable surgical changes from prior colon resection and ileal colonic anastomosis. No findings for active Crohn's disease. 2. No acute abdominal/pelvic findings, mass lesions or adenopathy.  CT A/P 06/22/2016 IMPRESSION: 1. The fistula between the right lateral aspect of the rectum and the skin is again identified. The portion of the fistula immediately beneath the skin is more  prominent in caliber in the interval with central decreased attenuation worrisome for a developing fluid collection in the distal fistula immediately beneath the cutaneous surface. A developing abscess cannot be excluded. Recommend clinical correlation. 2. Fluid throughout the remaining colon consistent with history of diarrhea. No colonic or small bowel inflammation identified. The rectum is poorly evaluated due to lack of distention. 3. 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  SBFT none  Procedures: Colonoscopy 09/2009 Diagnosis:  TRANSVERSE COLON MUCOSA COLD BIOPSY:  - MODERATE CHRONIC ACTIVE COLITIS, CONSISTENT WITH PATIENTS  HISTORY OF CROHNS DISEASE.  - NEGATIVE FOR DYSPLASIA AND MALIGNANCY.   Colonoscopy 06/2010 Diagnosis:  ULCERATED RIGHT SIDED COLON BIOPSY:  - MILD CHRONIC ACTIVE COLITIS, COMPATIBLE WITH PATIENTS KNOWN  HISTORY OF CROHNS DISEASE.  - NEGATIVE FOR DYSPLASIA AND MALIGNANCY.   Colonoscopies 07/2012 Diagnosis:  ANASTOMOTIC STRICTURE COLD BIOPSY:  - MILD CHRONIC ACTIVE COLITIS, COMPATIBLE WITH PATIENTS KNOWN  HISTORY OF CROHNS DISEASE.  - NEGATIVE FOR DYSPLASIA AND MALIGNANCY.   Colonoscopies 07/2013 Diagnosis:  ILEUM BIOPSY:  - SMALL BOWEL MUCOSA WITH PRESERVED VILLOUS ARCHITECTURE.  - NEGATIVE FOR INTRAEPITHELIAL LYMPHOCYTOSIS, DYSPLASIA AND  MALIGNANCY.   Colonoscopy 03/2015 Multiple ulcers in the sigmoid colon, no bleeding was present biopsies were taken, tight stricture at the ileocolonic anastomosis unable to get through scope, dilated with 12 mm TTS balloon. The terminal ileum appeared normal DIAGNOSIS:  A. SIGMOID COLON; COLD BIOPSY:  - MILD CHRONIC ACTIVE  COLITIS.  - NEGATIVE FOR DYSPLASIA AND MALIGNANCY.   Colonoscopy 12/2015 There was evidence of prior end-to-end ileocolonic anastomosis in the transverse colon. This was characterized by mild stenosis. The anastomosis was traversed. Scattered inflammation,  moderate in severity and characterized by shallow ulcerations was found in the proximal ileum. Biopsies were taken with the cold forceps for histology. Inflammation characterized by shallow ulcerations were found no sites were spared. This was moderate in severity. Biopsies were taken. Perianal fistula found on perianal exam. Pathology: Small intestine-severe chronic active ileitis with ulceration. 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 - Patent functional end-to-end ileo-colonic anastomosis, characterized by mild stenosis. Dilated to 14m. - Ulcerated mucosa in the neo-terminal ileum. Biopsied. - Mucosal ulceration in colon. Biopsied. - Active ileocolonic Crohn's disease, Rutgeert's i4 on cimzia and imuran - The distal rectum and anal verge are normal on retroflexion view.  Upper Endoscopy 12/2016 - Normal second portion of the duodenum and third portion of the duodenum. Biopsied. - Multiple non-bleeding duodenal ulcers with a clean ulcer base (Forrest Class III). Biopsied. - Normal stomach. Biopsied. - Normal gastroesophageal junction and esophagus.   Colonoscopy 09/14/2017 Perianal fistula and perianal skin tag found on perianal exam. - Patent functional end-to-end ileo-colonic anastomosis, characterized by edema, erythema and friable mucosa. - A few ulcers in the neo-terminal ileum. Rutgeert's i2 improved from i4 disease on remicade - Congested and superficial ulcerated mucosa in the rectum and in the recto-sigmoid colon. Biopsied.  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 Cimzia started in 05/28/2016 along with Imuran. Cimzia stopped in 01/2017. Remicade 5 mg/kg started in 02/2017, maintenance every 8 weeks, infliximab trough levels 2.4ug/ml on 09/09/2017, antibodies less than 22, increased Remicade to 10 mg/kg in 11/2017 Anti Integrins:  Ustekinumab: Tofactinib: Clinical trial:   Past Medical History:  Diagnosis Date  . Anxiety   . Asthma   . [place] disease [place]  . Depression   . Rectal fistula     Past Surgical History:  Procedure Laterality Date  . COLON RESECTION    . COLON SURGERY    . COLONOSCOPY WITH PROPOFOL N/A 04/03/2015   Procedure: COLONOSCOPY WITH PROPOFOL;  Surgeon: PaHulen LusterMD;  [facility];  Service: Endoscopy;  Laterality: N/A;  . COLONOSCOPY WITH PROPOFOL N/A 12/22/2015   Procedure: COLONOSCOPY WITH PROPOFOL;  Surgeon: DaLucilla LameMD;  [place] [facility] [place];  Laterality: N/A;  . COLONOSCOPY WITH PROPOFOL N/A 12/22/2016   Procedure: COLONOSCOPY WITH PROPOFOL;  Surgeon: VaLin LandsmanMD;  [place] [facility];  Laterality: N/A;  . COLONOSCOPY WITH PROPOFOL N/A 09/14/2017   Procedure: COLONOSCOPY WITH PROPOFOL;  Surgeon: VaLin LandsmanMD;  [facility];  Service: Gastroenterology;  Laterality: N/A;  .  ESOPHAGOGASTRODUODENOSCOPY (EGD) WITH PROPOFOL N/A 12/22/2016   Procedure: ESOPHAGOGASTRODUODENOSCOPY (EGD) WITH PROPOFOL;  Surgeon: Lin Landsman, MD;  Location: [place];  Service: Endoscopy;   Laterality: N/A;  . rectal fistula surgery    . WRIST SURGERY       Current Outpatient Medications:  .  acetaminophen (TYLENOL) 325 MG tablet, Take 2 tablets (650 mg total) by mouth every 6 (six) hours as needed for mild pain (or Fever >/= 101)., Disp: , Rfl:  .  benztropine (COGENTIN) 0.5 MG tablet, Take 1 tablet (0.5 mg total) by mouth at bedtime., Disp: 90 tablet, Rfl: 1 .  busPIRone (BUSPAR) 5 MG tablet, Take 1 tablet (5 mg total) by mouth 2 (two) times daily., Disp: 180 tablet, Rfl: 1 .  escitalopram (LEXAPRO) 10 MG tablet, Take 1 tablet (10 mg total) by mouth daily., Disp: 90 tablet, Rfl: 1 .  folic acid (FOLVITE) 1 MG tablet, TAKE 1 TABLET BY MOUTH EVERY DAY. TAKE 2 TABLETS ON DAY WHEN YOU TAKE METHOTREXATE, Disp: 90 tablet, Rfl: 0 .  hydrOXYzine (VISTARIL) 25 MG capsule, Take 1 capsule (25 mg total) by mouth daily as needed (severe anxiety attacks)., Disp: 90 capsule, Rfl: 1 .  mesalamine (ROWASA) 4 g enema, Place 60 mLs (4 g total) rectally at bedtime., Disp: 14 Bottle, Rfl: 0 .  methotrexate (RHEUMATREX) 2.5 MG tablet, Take 10 tablets by mouth once weekly for 3 weeks Caution:Chemotherapy. Protect from light. Follow-up with gastroenterology for more recommendations, Disp: 30 tablet, Rfl: 0 .  oxyCODONE (OXY IR/ROXICODONE) 5 MG immediate release tablet, Take 1 tablet (5 mg total) by mouth every 6 (six) hours as needed for moderate pain or breakthrough pain., Disp: 20 tablet, Rfl: 0   Family History  Problem Relation Age of Onset  . Diabetes Paternal Grandfather   . Heart disease Paternal Grandfather   . Depression Mother   . Drug abuse Mother   . Osteoporosis Neg Hx      Social History   Tobacco Use  . Smoking status: Never Smoker  . Smokeless tobacco: Former Network engineer Use Topics  . Alcohol use: Yes    Frequency: Never  . Drug use: No    Comment: hx of 3 year ago     Allergies as of 11/29/2017  . (No Known Allergies)    Review of Systems:    All systems  reviewed and negative except where noted in HPI.   Physical Exam:  BP 108/74 (BP Location: Left Arm, Patient Position: Sitting, Cuff Size: Normal)   Pulse 76   Resp 16   Ht 5' 5"  (1.651 m)   Wt 145 lb 12.8 oz (66.1 kg)   BMI 24.26 kg/m  No LMP for male patient. Filed Weights   11/29/17 1105  Weight: 145 lb 12.8 oz (66.1 kg)   General:   Alert, well-built, well-nourished, pleasant and cooperative in NAD Head:  Normocephalic and atraumatic. Eyes:  Sclera clear, no icterus.   Conjunctiva pale. Ears:  Normal auditory acuity. Nose:  No deformity, discharge, or lesions. Mouth:  No deformity or lesions,oropharynx pink & moist. Neck:  Supple; no masses or thyromegaly. Lungs:  Respirations even and unlabored.  Clear throughout to auscultation.   No wheezes, crackles, or rhonchi. No acute distress. Heart:  Regular rate and rhythm; no murmurs, clicks, rubs, or gallops. Abdomen:  Normal bowel sounds.  No bruits.  Soft, non-tender nondistended without masses, hepatosplenomegaly or hernias noted.  No guarding or rebound tenderness.  Midline vertical scar  from prior surgery Rectal: Not examined Msk:  Symmetrical without gross deformities. Good, equal movement & strength bilaterally. Pulses:  Normal pulses noted. Extremities:  No clubbing or edema.  No cyanosis. Neurologic:  Alert and oriented x3;  grossly normal neurologically. Skin:  Intact without significant lesions or rashes. No jaundice. Psych:  Alert and cooperative. Normal mood and affect.   Assessment and Plan:   Leroy Hernandez is a 32 y.o. caucasian male with Ileocolonic Crohn's disease diagnosed in 2007, stricturing and penetrating with perianal fistula, previously on Humira and Imuran, recurrence of postoperative Crohn's, resulted in discontinuation of Humira. Switched to [place] in 05/2016 biweekly and continued Imuran 150 mg daily which resulted in clinical remission followed by recurrence of luminal disease and perianal fistula  that responded to antibiotics, then recurrence of symptoms after tapering abx, responded to entocort, EGD and colonoscopy 12/2016 revealed gastric crohn's and post op recurrence of Crohn's Rutgeert's i4, anastomotic stricture and status post dilation. Switched to Remicade and methotrexate.  Temporarily in clinical remission, episode of C. Difficile treated with oral vancomycin, followed by flareup, currently on high-dose Remicade along with methotrexate, in clinical remission   Crohn's disease: 1. Continue high-dose Remicade, maintenance at 8 weeks interval. check Remicade antibodies and trough levels prior to next dose, will adjust the dose if he does not have antibodies and levels are low 2. continue methotrexate 40m po weekly, follow cbc and LFTs every 337month3. Continue folic acid 73m22maily and 2mg34m day of methotrexate 4. Recommend colonoscopy in the next 3 to 6 months to assess response to Remicade 5.  Stop Entocort   IBD Health Maintenance  1.TB status: PPD skin test negative as of October, 2017, quantiferon gold Negative, checked on 07/20/2017 2. Anemia: had iron deficiency anemia secondary to active Crohn's disease, resolved. Received parenteral iron infusions, 2, vitamin B12 is 397. Last ferritin level 114 in 07/2017 3.Immunizations: Hep A and B not immune, recommend Twinrix vaccine, first dose to be administered in clinic today, influenza-received annual influenza vaccine, last received in October/2019, administer Prevnar today and pneumovax in 6 months 4.Cancer screening I) Colon cancer/dysplasia surveillance: Last colonoscopy in 12/2016, no dysplasia II) Cervical cancer: N/A III) Skin cancer - counseled about annual skin exam by dermatology and skin protection in summer using sun screen SPF > 50, clothing 5.Bone health: Has osteoporosis, being followed by endocrine Vitamin D status: Normal as of 09/2016 Bone density testing: DEXA scan 09/29/2016 revealed osteoporosis, Z-score  -3.3 5. Labs: Recheck today, and every 3mon82monthSmoking: Never smoker 7. NSAIDs and Antibiotics use: No history of NSAID use  Follow up every 3mont773monthRohiniCephas Hernandez

## 2017-11-30 ENCOUNTER — Other Ambulatory Visit: Payer: Self-pay | Admitting: Gastroenterology

## 2017-11-30 LAB — HEPATIC FUNCTION PANEL
ALT: 29 IU/L (ref 0–44)
AST: 40 IU/L (ref 0–40)
Albumin: 4.5 g/dL (ref 3.5–5.5)
Alkaline Phosphatase: 88 IU/L (ref 39–117)
Bilirubin Total: 0.7 mg/dL (ref 0.0–1.2)
Bilirubin, Direct: 0.16 mg/dL (ref 0.00–0.40)
Total Protein: 7 g/dL (ref 6.0–8.5)

## 2017-11-30 LAB — CBC
HEMATOCRIT: 46.4 % (ref 37.5–51.0)
Hemoglobin: 15.4 g/dL (ref 13.0–17.7)
MCH: 27.8 pg (ref 26.6–33.0)
MCHC: 33.2 g/dL (ref 31.5–35.7)
MCV: 84 fL (ref 79–97)
PLATELETS: 266 10*3/uL (ref 150–450)
RBC: 5.54 x10E6/uL (ref 4.14–5.80)
RDW: 12.6 % (ref 12.3–15.4)
WBC: 7.6 10*3/uL (ref 3.4–10.8)

## 2017-11-30 LAB — C-REACTIVE PROTEIN: CRP: 2 mg/L (ref 0–10)

## 2017-12-18 ENCOUNTER — Other Ambulatory Visit: Payer: Self-pay | Admitting: Endocrinology

## 2017-12-18 NOTE — Telephone Encounter (Signed)
please call patient: F/u blood test is needed before this is refilled

## 2017-12-19 NOTE — Telephone Encounter (Signed)
Called pt to inform that he is in need of an OV with labs. Cannot refill rx until he has updated labs. LVM requesting returned call.

## 2017-12-20 NOTE — Telephone Encounter (Signed)
Letter has been mailed asking pt to call to schedule appt for lab draw. Will keep appt as scheduled with Dr. Loanne Drilling on 01/26/18.

## 2017-12-20 NOTE — Telephone Encounter (Signed)
2nd attempt to reach pt. LVM requesting returned call.

## 2018-01-10 ENCOUNTER — Encounter: Payer: Self-pay | Admitting: Gastroenterology

## 2018-01-11 ENCOUNTER — Other Ambulatory Visit: Payer: Self-pay | Admitting: Gastroenterology

## 2018-01-11 DIAGNOSIS — K5080 Crohn's disease of both small and large intestine without complications: Secondary | ICD-10-CM

## 2018-01-11 MED ORDER — METHOTREXATE 2.5 MG PO TABS: ORAL_TABLET | ORAL | 2 refills | Status: DC

## 2018-01-15 LAB — INFLIXIMAB (IFX) CONC+ IFX AB: Infliximab Drug Level: 9.6 ug/mL

## 2018-01-18 ENCOUNTER — Ambulatory Visit: Payer: BC Managed Care – PPO | Admitting: Psychiatry

## 2018-01-26 ENCOUNTER — Ambulatory Visit: Payer: BC Managed Care – PPO | Admitting: Endocrinology

## 2018-01-27 ENCOUNTER — Ambulatory Visit (INDEPENDENT_AMBULATORY_CARE_PROVIDER_SITE_OTHER): Payer: BC Managed Care – PPO | Admitting: Endocrinology

## 2018-01-27 ENCOUNTER — Encounter: Payer: Self-pay | Admitting: Endocrinology

## 2018-01-27 VITALS — BP 98/62 | HR 95 | Temp 98.0°F | Ht 65.0 in | Wt 150.0 lb

## 2018-01-27 DIAGNOSIS — M810 Age-related osteoporosis without current pathological fracture: Secondary | ICD-10-CM | POA: Diagnosis not present

## 2018-01-27 LAB — VITAMIN D 25 HYDROXY (VIT D DEFICIENCY, FRACTURES): VITD: 78.69 ng/mL (ref 30.00–100.00)

## 2018-01-27 NOTE — Progress Notes (Signed)
Subjective:    Patient ID: Leroy Hernandez, male    DOB: 1985/07/23, 32 y.o.   MRN: [id number]  HPI  The state of at least three ongoing medical problems is addressed today, with interval history of each noted here: Pt returns for f/u of osteoporosis: Dx'ed: 2018 Secondary causes: vit-D deficiency and intermitt steroids (both due to Crohn's) Fractures: right wrist (2000), right little finger (1995), and left hand (2018).   Past rx: none Current rx: ergocalciferol.  Last DEXA result: (2018): right Femur Neck (worst site): Z-score of -3.3.   Other: plan is to first normalize vit-D.  Interval hx: denies falls Vit-D def: he was taking prescription vitamin-D, possibly ergocalciferol 50,000 units, TIW, but he ran out 2 weeks ago.  He did not return for f/u labs. Denies leg cramps.  He says Crohn's is well-controlled.  Past Medical History:  Diagnosis Date  . Anxiety   . Asthma   . Blood in stool   . Calculus of gallbladder without cholecystitis without obstruction 12/25/2015  . Crohn's disease ([place])   . Depression   . Exacerbation of Crohn's disease ([place]) 09/10/2017  . Gallbladder polyp 12/25/2015  . Gastroenteritis due to norovirus 05/03/2017  . Generalized abdominal pain   . H/O Clostridium difficile infection 05/03/2017  . Iron deficiency anemia 12/28/2016  . Perirectal abscess 07/04/2015   Overview:  Added automatically from request for surgery [id number]  . Rectal fistula   . Sepsis ([place]) 06/22/2016    Past Surgical History:  Procedure Laterality Date  . COLON RESECTION    . COLON SURGERY    . COLONOSCOPY WITH PROPOFOL N/A 04/03/2015   Procedure: COLONOSCOPY WITH PROPOFOL;  Surgeon: Hulen Luster, MD;  Location: [facility] ENDOSCOPY;  Service: Endoscopy;  Laterality: N/A;  . COLONOSCOPY WITH PROPOFOL N/A 12/22/2015   Procedure: COLONOSCOPY WITH PROPOFOL;  Surgeon: [name], MD;  Location: [place];  Service: Endoscopy;  Laterality: N/A;  . COLONOSCOPY WITH PROPOFOL N/A  12/22/2016   Procedure: COLONOSCOPY WITH PROPOFOL;  Surgeon: Lin Landsman, MD;  Location: [place];  Service: Endoscopy;  Laterality: N/A;  . COLONOSCOPY WITH PROPOFOL N/A 09/14/2017   Procedure: COLONOSCOPY WITH PROPOFOL;  Surgeon: Lin Landsman, MD;  Location: [facility] ENDOSCOPY;  Service: Gastroenterology;  Laterality: N/A;  . ESOPHAGOGASTRODUODENOSCOPY (EGD) WITH PROPOFOL N/A 12/22/2016   Procedure: ESOPHAGOGASTRODUODENOSCOPY (EGD) WITH PROPOFOL;  Surgeon: Lin Landsman, MD;  Location: [place];  Service: Endoscopy;  Laterality: N/A;  . rectal fistula surgery    . WRIST SURGERY      Social History   Socioeconomic History  . Marital status: Legally Separated    Spouse name: Not on file  . Number of children: 0  . Years of education: Not on file  . Highest education level: Associate degree: occupational, Hotel manager, or vocational program  Occupational History    Employer: [employer]  Social Needs  . Financial resource strain: Not hard at all  . Food insecurity:    Worry: Never true    Inability: Never true  . Transportation needs:    Medical: No    Non-medical: No  Tobacco Use  . Smoking status: Never Smoker  . Smokeless tobacco: Former Network engineer and Sexual Activity  . Alcohol use: Yes    Frequency: Never  . Drug use: No    Comment: hx of 3 year ago   . Sexual activity: Yes  Lifestyle  . Physical activity:    Days per  week: 0 days    Minutes per session: 0 min  . Stress: Not at all  Relationships  . Social connections:    Talks on phone: More than three times a week    Gets together: Once a week    Attends religious service: Never    Active member of club or organization: No    Attends meetings of clubs or organizations: Never    Relationship status: Separated  . Intimate partner violence:    Fear of current or ex partner: No    Emotionally abused: No    Physically abused: No    Forced sexual activity: No  Other  Topics Concern  . Not on file  Social History Narrative  . Not on file    Current Outpatient Medications on File Prior to Visit  Medication Sig Dispense Refill  . acetaminophen (TYLENOL) 325 MG tablet Take 2 tablets (650 mg total) by mouth every 6 (six) hours as needed for mild pain (or Fever >/= 101).    . benztropine (COGENTIN) 0.5 MG tablet Take 1 tablet (0.5 mg total) by mouth at bedtime. 90 tablet 1  . busPIRone (BUSPAR) 5 MG tablet Take 1 tablet (5 mg total) by mouth 2 (two) times daily. 180 tablet 1  . escitalopram (LEXAPRO) 10 MG tablet Take 1 tablet (10 mg total) by mouth daily. 90 tablet 1  . folic acid (FOLVITE) 1 MG tablet TAKE 1 TABLET BY MOUTH EVERY DAY. TAKE 2 TABLETS ON DAY WHEN YOU TAKE METHOTREXATE 90 tablet 0  . hydrOXYzine (VISTARIL) 25 MG capsule Take 1 capsule (25 mg total) by mouth daily as needed (severe anxiety attacks). 90 capsule 1  . mesalamine (ROWASA) 4 g enema Place 60 mLs (4 g total) rectally at bedtime. 14 Bottle 0  . methotrexate (RHEUMATREX) 2.5 MG tablet Take 10 tablets by mouth once weekly along with daily folate 40 tablet 2   No current facility-administered medications on file prior to visit.     No Known Allergies  Family History  Problem Relation Age of Onset  . Diabetes Paternal Grandfather   . Heart disease Paternal Grandfather   . Depression Mother   . Drug abuse Mother   . Osteoporosis Neg Hx     BP 98/62 (BP Location: Left Arm, Patient Position: Sitting, Cuff Size: Normal)   Pulse 95   Temp 98 F (36.7 C) (Oral)   Ht 5' 5"  (1.651 m)   Wt 150 lb (68 kg)   BMI 24.96 kg/m   Review of Systems Denies numbness and LOC    Objective:   Physical Exam VITAL SIGNS:  See vs page.  GENERAL: no distress.  GAIT: normal and steady.    25-OH vit-D=78    Assessment & Plan:  Osteoporosis: due for recheck Noncompliance with medication and f/u labs.  We'll follow with more frequent ov's.   Vit-D def: well-replaced.  However, he will prob  need to resume at a lower dosage in the future Crohn's Dz: he may need ergocalciferol 50,000/week long-term.   Patient Instructions  blood tests are requested for you today.  We'll let you know about the results. When the vitamin-D is normal, we'll recheck the bone density test Please come back for a follow-up appointment in 6 weeks.

## 2018-01-27 NOTE — Patient Instructions (Signed)
blood tests are requested for you today.  We'll let you know about the results. When the vitamin-D is normal, we'll recheck the bone density test Please come back for a follow-up appointment in 6 weeks.

## 2018-02-02 ENCOUNTER — Other Ambulatory Visit: Payer: Self-pay | Admitting: Gastroenterology

## 2018-02-02 DIAGNOSIS — K5080 Crohn's disease of both small and large intestine without complications: Secondary | ICD-10-CM

## 2018-02-15 ENCOUNTER — Ambulatory Visit: Payer: BC Managed Care – PPO | Admitting: Psychiatry

## 2018-02-20 ENCOUNTER — Encounter: Payer: Self-pay | Admitting: Psychiatry

## 2018-02-20 ENCOUNTER — Other Ambulatory Visit: Payer: Self-pay

## 2018-02-20 ENCOUNTER — Ambulatory Visit (INDEPENDENT_AMBULATORY_CARE_PROVIDER_SITE_OTHER): Payer: BC Managed Care – PPO | Admitting: Psychiatry

## 2018-02-20 VITALS — BP 110/76 | HR 92 | Temp 98.9°F | Wt 148.4 lb

## 2018-02-20 DIAGNOSIS — F411 Generalized anxiety disorder: Secondary | ICD-10-CM

## 2018-02-20 DIAGNOSIS — F3341 Major depressive disorder, recurrent, in partial remission: Secondary | ICD-10-CM

## 2018-02-20 MED ORDER — ESCITALOPRAM OXALATE 10 MG PO TABS: 10.0000 mg | ORAL_TABLET | Freq: Every day | ORAL | 1 refills | Status: AC

## 2018-02-20 MED ORDER — BUSPIRONE HCL 5 MG PO TABS: 5.0000 mg | ORAL_TABLET | Freq: Two times a day (BID) | ORAL | 1 refills | Status: DC

## 2018-02-20 NOTE — Progress Notes (Signed)
[place] MD OP Progress Note  02/20/2018 4:16 PM ANTOIN Hernandez  MRN:  [id number]  Chief Complaint: ' I am here for follow up.' Chief Complaint    Follow-up; Medication Refill     HPI: Leroy Hernandez is a 33 year old Caucasian male, employed, lives in [place], has a history of anxiety, depression, presented to the clinic today for a follow-up visit.  Patient reports his mood symptoms are currently fair.  He denies any significant sadness or anxiety symptoms.  He denies any suicidality.  He reports work is going well.  He is compliant on his Lexapro and BuSpar.  He is unsure whether he is taking the Abilify or not.  He reports he will take a look at his medications and let writer know.  Patient denies any other concerns today. Visit Diagnosis:    ICD-10-CM   1. MDD (major depressive disorder), recurrent, in partial remission (HCC) F33.41 busPIRone (BUSPAR) 5 MG tablet    escitalopram (LEXAPRO) 10 MG tablet  2. GAD (generalized anxiety disorder) F41.1 escitalopram (LEXAPRO) 10 MG tablet    Past Psychiatric History: Reviewed past psychiatric history from my progress note on 03/22/2017.  Past trials of Ativan  Past Medical History:  Past Medical History:  Diagnosis Date  . Anxiety   . Asthma   . Blood in stool   . Calculus of gallbladder without cholecystitis without obstruction 12/25/2015  . Crohn's disease ([place])   . Depression   . Exacerbation of Crohn's disease ([place]) 09/10/2017  . Gallbladder polyp 12/25/2015  . Gastroenteritis due to norovirus 05/03/2017  . Generalized abdominal pain   . H/O Clostridium difficile infection 05/03/2017  . Iron deficiency anemia 12/28/2016  . Perirectal abscess 07/04/2015   Overview:  Added automatically from request for surgery [id number]  . Rectal fistula   . Sepsis ([place]) 06/22/2016    Past Surgical History:  Procedure Laterality Date  . COLON RESECTION    . COLON SURGERY    . COLONOSCOPY WITH PROPOFOL N/A 04/03/2015   Procedure: COLONOSCOPY WITH  PROPOFOL;  Surgeon: Hulen Luster, MD;  Location: [facility] ENDOSCOPY;  Service: Endoscopy;  Laterality: N/A;  . COLONOSCOPY WITH PROPOFOL N/A 12/22/2015   Procedure: COLONOSCOPY WITH PROPOFOL;  Surgeon: [name], MD;  Location: [place];  Service: Endoscopy;  Laterality: N/A;  . COLONOSCOPY WITH PROPOFOL N/A 12/22/2016   Procedure: COLONOSCOPY WITH PROPOFOL;  Surgeon: Lin Landsman, MD;  Location: [place];  Service: Endoscopy;  Laterality: N/A;  . COLONOSCOPY WITH PROPOFOL N/A 09/14/2017   Procedure: COLONOSCOPY WITH PROPOFOL;  Surgeon: Lin Landsman, MD;  Location[facility] ENDOSCOPY;  Service: Gastroenterology;  Laterality: N/A;  . ESOPHAGOGASTRODUODENOSCOPY (EGD) WITH PROPOFOL N/A 12/22/2016   Procedure: ESOPHAGOGASTRODUODENOSCOPY (EGD) WITH PROPOFOL;  Surgeon: Lin Landsman, MD;  Location: [place];  Service: Endoscopy;  Laterality: N/A;  . rectal fistula surgery    . WRIST SURGERY      Family Psychiatric History: I have reviewed family psychiatric history from my progress note on 03/22/2017.  Family History:  Family History  Problem Relation Age of Onset  . Diabetes Paternal Grandfather   . Heart disease Paternal Grandfather   . Depression Mother   . Drug abuse Mother   . Osteoporosis Neg Hx     Social History: Have reviewed social history from my progress note on 03/22/2017. Social History   Socioeconomic History  . Marital status: Legally Separated    Spouse name: Not on file  . Number of children: 0  .  Years of education: Not on file  . Highest education level: Associate degree: occupational, Hotel manager, or vocational program  Occupational History    Employer: [employer]  Social Needs  . Financial resource strain: Not hard at all  . Food insecurity:    Worry: Never true    Inability: Never true  . Transportation needs:    Medical: No    Non-medical: No  Tobacco Use  . Smoking status: Never Smoker  . Smokeless  tobacco: Former Network engineer and Sexual Activity  . Alcohol use: Yes    Frequency: Never  . Drug use: No    Comment: hx of 3 year ago   . Sexual activity: Yes  Lifestyle  . Physical activity:    Days per week: 0 days    Minutes per session: 0 min  . Stress: Not at all  Relationships  . Social connections:    Talks on phone: More than three times a week    Gets together: Once a week    Attends religious service: Never    Active member of club or organization: No    Attends meetings of clubs or organizations: Never    Relationship status: Separated  Other Topics Concern  . Not on file  Social History Narrative  . Not on file    Allergies: No Known Allergies  Metabolic Disorder Labs: Lab Results  Component Value Date   HGBA1C 5.0 03/22/2017   MPG 96.8 03/22/2017   Lab Results  Component Value Date   PROLACTIN 9.4 03/22/2017   Lab Results  Component Value Date   CHOL 130 03/22/2017   TRIG 72 03/22/2017   HDL 47 03/22/2017   CHOLHDL 2.8 03/22/2017   VLDL 14 03/22/2017   LDLCALC 69 03/22/2017   Lab Results  Component Value Date   TSH 1.257 03/22/2017   TSH 1.80 12/09/2016    Therapeutic Level Labs: No results found for: LITHIUM No results found for: VALPROATE No components found for:  CBMZ  Current Medications: Current Outpatient Medications  Medication Sig Dispense Refill  . acetaminophen (TYLENOL) 325 MG tablet Take 2 tablets (650 mg total) by mouth every 6 (six) hours as needed for mild pain (or Fever >/= 101).    . busPIRone (BUSPAR) 5 MG tablet Take 1 tablet (5 mg total) by mouth 2 (two) times daily. 180 tablet 1  . escitalopram (LEXAPRO) 10 MG tablet Take 1 tablet (10 mg total) by mouth daily. 90 tablet 1  . folic acid (FOLVITE) 1 MG tablet TAKE 1 TABLET BY MOUTH EVERY DAY. TAKE 2 TABLETS ON DAY WHEN YOU TAKE METHOTREXATE 90 tablet 0  . hydrOXYzine (VISTARIL) 25 MG capsule Take 1 capsule (25 mg total) by mouth daily as needed (severe anxiety  attacks). 90 capsule 1  . mesalamine (ROWASA) 4 g enema Place 60 mLs (4 g total) rectally at bedtime. 14 Bottle 0  . methotrexate (RHEUMATREX) 2.5 MG tablet TAKE 10 TABLETS BY MOUTH ONCE WEEKLY ALONG WITH DAILY FOLATE 120 tablet 1   No current facility-administered medications for this visit.      Musculoskeletal: Strength & Muscle Tone: within normal limits Gait & Station: normal Patient leans: N/A  Psychiatric Specialty Exam: Review of Systems  Psychiatric/Behavioral: Negative for suicidal ideas. The patient is not nervous/anxious.   All other systems reviewed and are negative.   Blood pressure 110/76, pulse 92, temperature 98.9 F (37.2 C), temperature source Oral, weight 148 lb 6.4 oz (67.3 kg).Body mass index is  24.7 kg/m.  General Appearance: Casual  Eye Contact:  Fair  Speech:  Clear and Coherent  Volume:  Normal  Mood:  Euthymic  Affect:  Congruent  Thought Process:  Goal Directed and Descriptions of Associations: Intact  Orientation:  Full (Time, Place, and Person)  Thought Content: Logical   Suicidal Thoughts:  No  Homicidal Thoughts:  No  Memory:  Immediate;   Fair Recent;   Fair Remote;   Fair  Judgement:  Fair  Insight:  Fair  Psychomotor Activity:  Normal  Concentration:  Concentration: Fair and Attention Span: Fair  Recall:  [employer] of Knowledge: Fair  Language: Fair  Akathisia:  No  Handed:  Right  AIMS (if indicated): denies tremors, rigidity  Assets:  Communication Skills Desire for Improvement Social Support  ADL's:  Intact  Cognition: WNL  Sleep:  Fair   Screenings: AIMS     Office Visit from 06/13/2017 in [place] Total Score  3       Assessment and Plan: Leroy Hernandez is a 33 year old Caucasian male, employed, lives in [place], has a history of anxiety, depression, intermittent rages, Crohn's disease, presented to the clinic today for a follow-up visit.  Patient reports mood symptoms continues to be good.   Will not make any changes today.  Plan MDD in partial remission Lexapro 10 mg p.o. daily BuSpar 5 mg p.o. twice daily.  For generalized anxiety disorder-improving BuSpar and Lexapro as prescribed  Follow-up in clinic in 6 months or sooner if needed.   More than 50 % of the time was spent for psychoeducation and supportive psychotherapy and care coordination.  This note was generated in part or whole with voice recognition software. Voice recognition is usually quite accurate but there are transcription errors that can and very often do occur. I apologize for any typographical errors that were not detected and corrected.        Ursula Alert, MD 02/20/2018, 4:16 PM

## 2018-03-11 ENCOUNTER — Other Ambulatory Visit
Admission: RE | Admit: 2018-03-11 | Discharge: 2018-03-11 | Disposition: A | Discharge status: Home / Self Care | Payer: BC Managed Care – PPO | Source: Ambulatory Visit | Attending: Family Medicine | Admitting: Family Medicine

## 2018-03-11 DIAGNOSIS — K529 Noninfective gastroenteritis and colitis, unspecified: Secondary | ICD-10-CM | POA: Diagnosis present

## 2018-03-11 LAB — C DIFFICILE QUICK SCREEN W PCR REFLEX
C DIFFICILE (CDIFF) INTERP: NOT DETECTED
C Diff antigen: NEGATIVE
C Diff toxin: NEGATIVE

## 2018-03-13 ENCOUNTER — Ambulatory Visit: Payer: BC Managed Care – PPO | Admitting: Endocrinology

## 2018-03-16 ENCOUNTER — Encounter: Payer: Self-pay | Admitting: Endocrinology

## 2018-03-16 ENCOUNTER — Ambulatory Visit (INDEPENDENT_AMBULATORY_CARE_PROVIDER_SITE_OTHER): Payer: BC Managed Care – PPO | Admitting: Endocrinology

## 2018-03-16 VITALS — BP 100/60 | HR 85 | Ht 65.0 in | Wt 148.4 lb

## 2018-03-16 DIAGNOSIS — M810 Age-related osteoporosis without current pathological fracture: Secondary | ICD-10-CM | POA: Diagnosis not present

## 2018-03-16 LAB — VITAMIN D 25 HYDROXY (VIT D DEFICIENCY, FRACTURES): VITD: 33.34 ng/mL (ref 30.00–100.00)

## 2018-03-16 NOTE — Progress Notes (Signed)
Subjective:    Patient ID: Leroy Hernandez, male    DOB: 10/20/1985, 33 y.o.   MRN: [id number]  HPI The state of at least three ongoing medical problems is addressed today, with interval history of each noted here: Pt returns for f/u of osteoporosis: Dx'ed: 2018 Secondary causes: vit-D deficiency and intermitt steroids (both due to Crohn's) Fractures: right wrist (2000), right little finger (1995), and left hand (2018).   Past rx: none Current rx: ergocalciferol.  Last DEXA result: (2018): right Femur Neck (worst site): Z-score of -3.3.   Other: plan is to first normalize vit-D.  Interval hx: denies falls Vit-D def: he is off prescription vitamin-D supplement now. Denies leg cramps.  A few weeks ago, he had an episode of Crohn's vs viral GE.  sxs are resolved now.  He is finished with the prednisone.   Past Medical History:  Diagnosis Date  . Anxiety   . Asthma   . Blood in stool   . Calculus of gallbladder without cholecystitis without obstruction 12/25/2015  . Crohn's disease ([place])   . Depression   . Exacerbation of Crohn's disease ([place]) 09/10/2017  . Gallbladder polyp 12/25/2015  . Gastroenteritis due to norovirus 05/03/2017  . Generalized abdominal pain   . H/O Clostridium difficile infection 05/03/2017  . Iron deficiency anemia 12/28/2016  . Perirectal abscess 07/04/2015   Overview:  Added automatically from request for surgery [id number]  . Rectal fistula   . Sepsis ([place]) 06/22/2016    Past Surgical History:  Procedure Laterality Date  . COLON RESECTION    . COLON SURGERY    . COLONOSCOPY WITH PROPOFOL N/A 04/03/2015   Procedure: COLONOSCOPY WITH PROPOFOL;  Surgeon: Hulen Luster, MD;  Location[facility] ENDOSCOPY;  Service: Endoscopy;  Laterality: N/A;  . COLONOSCOPY WITH PROPOFOL N/A 12/22/2015   Procedure: COLONOSCOPY WITH PROPOFOL;  Surgeon: [name], MD;  Location: [place];  Service: Endoscopy;  Laterality: N/A;  . COLONOSCOPY WITH PROPOFOL N/A 12/22/2016   Procedure: COLONOSCOPY WITH PROPOFOL;  Surgeon: Lin Landsman, MD;  Location: [place];  Service: Endoscopy;  Laterality: N/A;  . COLONOSCOPY WITH PROPOFOL N/A 09/14/2017   Procedure: COLONOSCOPY WITH PROPOFOL;  Surgeon: Lin Landsman, MD;  Location: [facility] ENDOSCOPY;  Service: Gastroenterology;  Laterality: N/A;  . ESOPHAGOGASTRODUODENOSCOPY (EGD) WITH PROPOFOL N/A 12/22/2016   Procedure: ESOPHAGOGASTRODUODENOSCOPY (EGD) WITH PROPOFOL;  Surgeon: Lin Landsman, MD;  Location: [place];  Service: Endoscopy;  Laterality: N/A;  . rectal fistula surgery    . WRIST SURGERY      Social History   Socioeconomic History  . Marital status: Legally Separated    Spouse name: Not on file  . Number of children: 0  . Years of education: Not on file  . Highest education level: Associate degree: occupational, Hotel manager, or vocational program  Occupational History    Employer: [employer]  Social Needs  . Financial resource strain: Not hard at all  . Food insecurity:    Worry: Never true    Inability: Never true  . Transportation needs:    Medical: No    Non-medical: No  Tobacco Use  . Smoking status: Never Smoker  . Smokeless tobacco: Former Network engineer and Sexual Activity  . Alcohol use: Yes    Frequency: Never  . Drug use: No    Comment: hx of 3 year ago   . Sexual activity: Yes  Lifestyle  . Physical activity:    Days  per week: 0 days    Minutes per session: 0 min  . Stress: Not at all  Relationships  . Social connections:    Talks on phone: More than three times a week    Gets together: Once a week    Attends religious service: Never    Active member of club or organization: No    Attends meetings of clubs or organizations: Never    Relationship status: Separated  . Intimate partner violence:    Fear of current or ex partner: No    Emotionally abused: No    Physically abused: No    Forced sexual activity: No  Other Topics  Concern  . Not on file  Social History Narrative  . Not on file    Current Outpatient Medications on File Prior to Visit  Medication Sig Dispense Refill  . acetaminophen (TYLENOL) 325 MG tablet Take 2 tablets (650 mg total) by mouth every 6 (six) hours as needed for mild pain (or Fever >/= 101).    . busPIRone (BUSPAR) 5 MG tablet Take 1 tablet (5 mg total) by mouth 2 (two) times daily. 180 tablet 1  . escitalopram (LEXAPRO) 10 MG tablet Take 1 tablet (10 mg total) by mouth daily. 90 tablet 1  . folic acid (FOLVITE) 1 MG tablet TAKE 1 TABLET BY MOUTH EVERY DAY. TAKE 2 TABLETS ON DAY WHEN YOU TAKE METHOTREXATE 90 tablet 0  . hydrOXYzine (VISTARIL) 25 MG capsule Take 1 capsule (25 mg total) by mouth daily as needed (severe anxiety attacks). 90 capsule 1  . mesalamine (ROWASA) 4 g enema Place 60 mLs (4 g total) rectally at bedtime. 14 Bottle 0  . methotrexate (RHEUMATREX) 2.5 MG tablet TAKE 10 TABLETS BY MOUTH ONCE WEEKLY ALONG WITH DAILY FOLATE 120 tablet 1   No current facility-administered medications on file prior to visit.     No Known Allergies  Family History  Problem Relation Age of Onset  . Diabetes Paternal Grandfather   . Heart disease Paternal Grandfather   . Depression Mother   . Drug abuse Mother   . Osteoporosis Neg Hx     BP 100/60 (BP Location: Left Arm, Patient Position: Sitting, Cuff Size: Normal)   Pulse 85   Ht 5' 5"  (1.651 m)   Wt 148 lb 6.4 oz (67.3 kg)   SpO2 97%   BMI 24.70 kg/m    Review of Systems Denies numbness    Objective:   Physical Exam VITAL SIGNS:  See vs page GENERAL: no distress GAIT: normal and steady.   Lab Results  Component Value Date   TSH 1.257 03/22/2017   Lab Results  Component Value Date   PTH 39 09/19/2017   CALCIUM 9.0 09/19/2017   PHOS 3.7 06/23/2016        Assessment & Plan:  Vit-D def: recheck today. Osteoporosis: due for recheck  Patient Instructions  blood tests are requested for you today.  We'll let  you know about the results. Based on the results, you will probably need to take the prescription vitamin-D, 1 or 2 times per week--I'll let you know When the vitamin-D is normal, we'll recheck the bone density test. Please come back for a follow-up appointment in 6 months.

## 2018-03-16 NOTE — Patient Instructions (Addendum)
blood tests are requested for you today.  We'll let you know about the results. Based on the results, you will probably need to take the prescription vitamin-D, 1 or 2 times per week--I'll let you know When the vitamin-D is normal, we'll recheck the bone density test. Please come back for a follow-up appointment in 6 months.

## 2018-03-17 LAB — PTH, INTACT AND CALCIUM
CALCIUM: 9 mg/dL (ref 8.6–10.3)
PTH: 16 pg/mL (ref 14–64)

## 2018-04-15 ENCOUNTER — Other Ambulatory Visit: Payer: Self-pay | Admitting: Psychiatry

## 2018-04-15 DIAGNOSIS — F331 Major depressive disorder, recurrent, moderate: Secondary | ICD-10-CM

## 2018-05-15 ENCOUNTER — Encounter

## 2018-05-15 ENCOUNTER — Ambulatory Visit: Payer: BC Managed Care – PPO | Admitting: Gastroenterology

## 2018-06-14 ENCOUNTER — Other Ambulatory Visit: Payer: Self-pay | Admitting: Psychiatry

## 2018-06-14 DIAGNOSIS — F331 Major depressive disorder, recurrent, moderate: Secondary | ICD-10-CM

## 2018-06-19 ENCOUNTER — Ambulatory Visit: Payer: BC Managed Care – PPO | Admitting: Gastroenterology

## 2018-06-21 ENCOUNTER — Telehealth: Payer: Self-pay

## 2018-06-21 ENCOUNTER — Other Ambulatory Visit: Payer: Self-pay | Admitting: Psychiatry

## 2018-06-21 DIAGNOSIS — F331 Major depressive disorder, recurrent, moderate: Secondary | ICD-10-CM

## 2018-06-21 DIAGNOSIS — F0630 Mood disorder due to known physiological condition, unspecified: Secondary | ICD-10-CM

## 2018-06-21 MED ORDER — ARIPIPRAZOLE 5 MG PO TABS: 5.0000 mg | ORAL_TABLET | Freq: Every day | ORAL | 0 refills | Status: DC

## 2018-06-21 NOTE — Telephone Encounter (Signed)
spoke with patient he states that he is taking the abilify. he said that he thought that was the medication that he stop but that wasnt the name he forgot to call and let you know that he was still taken the abilify that it was something else he stopped

## 2018-06-21 NOTE — Telephone Encounter (Signed)
Restart Abilify, sent to pharmacy

## 2018-06-21 NOTE — Telephone Encounter (Signed)
left a message that he wants to go back on abilify that he needs refills.Leroy Hernandez

## 2018-07-19 DIAGNOSIS — C4492 Squamous cell carcinoma of skin, unspecified: Secondary | ICD-10-CM

## 2018-07-19 HISTORY — DX: Squamous cell carcinoma of skin, unspecified: C44.92

## 2018-07-27 ENCOUNTER — Other Ambulatory Visit: Payer: Self-pay | Admitting: Gastroenterology

## 2018-07-27 DIAGNOSIS — K5080 Crohn's disease of both small and large intestine without complications: Secondary | ICD-10-CM

## 2018-07-28 ENCOUNTER — Other Ambulatory Visit: Payer: Self-pay | Admitting: Psychiatry

## 2018-07-28 DIAGNOSIS — F0630 Mood disorder due to known physiological condition, unspecified: Secondary | ICD-10-CM

## 2018-08-21 ENCOUNTER — Ambulatory Visit: Payer: BC Managed Care – PPO | Admitting: Psychiatry

## 2018-09-04 ENCOUNTER — Other Ambulatory Visit: Payer: Self-pay | Admitting: Psychiatry

## 2018-09-04 DIAGNOSIS — F0630 Mood disorder due to known physiological condition, unspecified: Secondary | ICD-10-CM

## 2018-09-07 ENCOUNTER — Ambulatory Visit: Payer: BC Managed Care – PPO | Admitting: Endocrinology

## 2018-10-02 ENCOUNTER — Other Ambulatory Visit: Payer: BC Managed Care – PPO

## 2018-10-14 ENCOUNTER — Other Ambulatory Visit: Payer: Self-pay | Admitting: Psychiatry

## 2018-10-14 DIAGNOSIS — F0630 Mood disorder due to known physiological condition, unspecified: Secondary | ICD-10-CM

## 2018-10-17 ENCOUNTER — Ambulatory Visit: Payer: BC Managed Care – PPO | Admitting: Gastroenterology

## 2018-11-07 ENCOUNTER — Emergency Department: Payer: BC Managed Care – PPO

## 2018-11-07 ENCOUNTER — Other Ambulatory Visit: Payer: Self-pay

## 2018-11-07 ENCOUNTER — Emergency Department
Admission: EM | Admit: 2018-11-07 | Discharge: 2018-11-07 | Disposition: A | Discharge status: Home / Self Care | Payer: BC Managed Care – PPO | Attending: Student | Admitting: Student

## 2018-11-07 DIAGNOSIS — Z79899 Other long term (current) drug therapy: Secondary | ICD-10-CM | POA: Diagnosis not present

## 2018-11-07 DIAGNOSIS — R0789 Other chest pain: Secondary | ICD-10-CM | POA: Diagnosis not present

## 2018-11-07 DIAGNOSIS — K5090 Crohn's disease, unspecified, without complications: Secondary | ICD-10-CM | POA: Diagnosis not present

## 2018-11-07 DIAGNOSIS — J45909 Unspecified asthma, uncomplicated: Secondary | ICD-10-CM | POA: Diagnosis not present

## 2018-11-07 DIAGNOSIS — R079 Chest pain, unspecified: Secondary | ICD-10-CM | POA: Diagnosis present

## 2018-11-07 DIAGNOSIS — R002 Palpitations: Secondary | ICD-10-CM

## 2018-11-07 LAB — BASIC METABOLIC PANEL
Anion gap: 8 (ref 5–15)
BUN: 9 mg/dL (ref 6–20)
CO2: 22 mmol/L (ref 22–32)
Calcium: 9 mg/dL (ref 8.9–10.3)
Chloride: 107 mmol/L (ref 98–111)
Creatinine, Ser: 0.98 mg/dL (ref 0.61–1.24)
GFR calc Af Amer: >60 mL/min (ref >60)
GFR calc non Af Amer: >60 mL/min (ref >60)
Glucose, Bld: 105 mg/dL — ABNORMAL HIGH (ref 70–99)
Potassium: 3.6 mmol/L (ref 3.5–5.1)
Sodium: 137 mmol/L (ref 135–145)

## 2018-11-07 LAB — CBC
HCT: 43.2 % (ref 39.0–52.0)
Hemoglobin: 14.6 g/dL (ref 13.0–17.0)
MCH: 27.1 pg (ref 26.0–34.0)
MCHC: 33.8 g/dL (ref 30.0–36.0)
MCV: 80.1 fL (ref 80.0–100.0)
Platelets: 298 10*3/uL (ref 150–400)
RBC: 5.39 MIL/uL (ref 4.22–5.81)
RDW: 12.9 % (ref 11.5–15.5)
WBC: 9.4 10*3/uL (ref 4.0–10.5)
nRBC: 0 % (ref 0.0–0.2)

## 2018-11-07 LAB — TROPONIN I (HIGH SENSITIVITY): Troponin I (High Sensitivity): <2 ng/L (ref <18)

## 2018-11-07 NOTE — Discharge Instructions (Signed)
Thank you for letting us take care of you in the emergency department today.   Please continue to take any regular, prescribed medications.   Please follow up with: - Your primary care doctor to review your ER visit and follow up on your symptoms.   Please return to the ER for any new or worsening symptoms.

## 2018-11-07 NOTE — ED Triage Notes (Signed)
Reports CP that started approx 2 hours ago. Pt reports he drank a large cup of coffee and 414m tablets of caffeine at work. Reports CP started after taking these. Pt alert and oriented X4, cooperative, RR even and unlabored, color WNL. Pt in NAD.

## 2018-11-07 NOTE — ED Notes (Signed)
Pt sitting on bed speaking with this RN in NAD, reports chest pain still comes and goes but reports this as a dull pain, 2/10 when pain hits but 0/10 now. A&Ox4.

## 2018-11-07 NOTE — ED Notes (Signed)
Pt given turkey sandwich tray and ginger ale. 

## 2018-11-07 NOTE — ED Notes (Signed)
Dr. Joan Mayans at bedside

## 2018-11-08 NOTE — ED Provider Notes (Signed)
[facility] Emergency Department Provider Note  ____________________________________________   First MD Initiated Contact with Patient 11/07/18 1605     (approximate)  I have reviewed the triage vital signs and the nursing notes.  History  Chief Complaint Chest Pain    HPI Leroy Hernandez is a 33 y.o. male with history of Crohn's who presents for palpitations and chest discomfort. He states earlier today he took two caffeine tabs and then drank a soda. Shortly thereafter he felt like his heart was racing and had an associated discomfort in the center of his chest. He had no nausea, SOB, diaphoresis with these symptoms. They have improved on arrival to the ED. He does not use tobacco. No hx of CAD.   Past Medical Hx Past Medical History:  Diagnosis Date  . Anxiety   . Asthma   . Blood in stool   . Calculus of gallbladder without cholecystitis without obstruction 12/25/2015  . Crohn's disease ([place])   . Depression   . Exacerbation of Crohn's disease ([place]) 09/10/2017  . Gallbladder polyp 12/25/2015  . Gastroenteritis due to norovirus 05/03/2017  . Generalized abdominal pain   . H/O Clostridium difficile infection 05/03/2017  . Iron deficiency anemia 12/28/2016  . Perirectal abscess 07/04/2015   Overview:  Added automatically from request for surgery [phone]  . Rectal fistula   . Sepsis ([place]) 06/22/2016    Problem List Patient Active Problem List   Diagnosis Date Noted  . Crohn's disease of colon with complication ([place])   . Iron deficiency anemia 12/28/2016  . Osteoporosis 12/09/2016  . Perirectal fistula   . Fracture of metacarpal bone 06/09/2016  . Intestinal bypass or anastomosis status   . Crohn's disease of both small and large intestine with rectal bleeding ([place])   . Mild episode of recurrent major depressive disorder ([place]) 04/02/2015  . Asthma, mild intermittent 04/02/2015    Past Surgical Hx Past Surgical History:  Procedure Laterality  Date  . COLON RESECTION    . COLON SURGERY    . COLONOSCOPY WITH PROPOFOL N/A 04/03/2015   Procedure: COLONOSCOPY WITH PROPOFOL;  Surgeon: Hulen Luster, MD;  Location: [facility] ENDOSCOPY;  Service: Endoscopy;  Laterality: N/A;  . COLONOSCOPY WITH PROPOFOL N/A 12/22/2015   Procedure: COLONOSCOPY WITH PROPOFOL;  Surgeon: [name], MD;  Location: [place];  Service: Endoscopy;  Laterality: N/A;  . COLONOSCOPY WITH PROPOFOL N/A 12/22/2016   Procedure: COLONOSCOPY WITH PROPOFOL;  Surgeon: Lin Landsman, MD;  Location: [place];  Service: Endoscopy;  Laterality: N/A;  . COLONOSCOPY WITH PROPOFOL N/A 09/14/2017   Procedure: COLONOSCOPY WITH PROPOFOL;  Surgeon: Lin Landsman, MD;  Location: [facility] ENDOSCOPY;  Service: Gastroenterology;  Laterality: N/A;  . ESOPHAGOGASTRODUODENOSCOPY (EGD) WITH PROPOFOL N/A 12/22/2016   Procedure: ESOPHAGOGASTRODUODENOSCOPY (EGD) WITH PROPOFOL;  Surgeon: Lin Landsman, MD;  Location: [place];  Service: Endoscopy;  Laterality: N/A;  . rectal fistula surgery    . WRIST SURGERY      Medications Prior to Admission medications   Medication Sig Start Date End Date Taking? Authorizing Provider  acetaminophen (TYLENOL) 325 MG tablet Take 2 tablets (650 mg total) by mouth every 6 (six) hours as needed for mild pain (or Fever >/= 101). 09/15/17   Gouru, Aruna, MD  ARIPiprazole (ABILIFY) 5 MG tablet TAKE 1 TABLET BY MOUTH EVERYDAY AT BEDTIME 07/28/18   Eappen, Ria Clock, MD  busPIRone (BUSPAR) 5 MG tablet Take 1 tablet (5 mg total) by mouth 2 (two)  times daily. 02/20/18   Ursula Alert, MD  escitalopram (LEXAPRO) 10 MG tablet Take 1 tablet (10 mg total) by mouth daily. 02/20/18   Ursula Alert, MD  folic acid (FOLVITE) 1 MG tablet TAKE 1 TABLET BY MOUTH EVERY DAY. TAKE 2 TABLETS ON DAY WHEN YOU TAKE METHOTREXATE 09/15/17   Nicholes Mango, MD  hydrOXYzine (VISTARIL) 25 MG capsule Take 1 capsule (25 mg total) by mouth daily as needed (severe  anxiety attacks). 07/15/17   Ursula Alert, MD  mesalamine (ROWASA) 4 g enema Place 60 mLs (4 g total) rectally at bedtime. 09/15/17   Gouru, Illene Silver, MD  methotrexate (RHEUMATREX) 2.5 MG tablet TAKE 10 TABLETS BY MOUTH ONCE WEEKLY ALONG WITH DAILY FOLATE 02/02/18   Vanga, Tally Due, MD    Allergies Patient has no known allergies.  Family Hx Family History  Problem Relation Age of Onset  . Diabetes Paternal Grandfather   . Heart disease Paternal Grandfather   . Depression Mother   . Drug abuse Mother   . Osteoporosis Neg Hx     Social Hx Social History   Tobacco Use  . Smoking status: Never Smoker  . Smokeless tobacco: Former Network engineer Use Topics  . Alcohol use: Yes    Frequency: Never  . Drug use: No    Comment: hx of 3 year ago      Review of Systems  Constitutional: Negative for fever, chills. Eyes: Negative for visual changes. ENT: Negative for sore throat. Cardiovascular: + for palpitations, chest pain. Respiratory: Negative for shortness of breath. Gastrointestinal: Negative for nausea, vomiting.  Genitourinary: Negative for dysuria. Musculoskeletal: Negative for leg swelling. Skin: Negative for rash. Neurological: Negative for for headaches.   Physical Exam  Vital Signs: ED Triage Vitals  Enc Vitals Group     BP 11/07/18 1213 103/69     Pulse Rate 11/07/18 1213 85     Resp 11/07/18 1213 16     Temp 11/07/18 1213 98.1 F (36.7 C)     Temp Source 11/07/18 1213 Oral     SpO2 11/07/18 1213 100 %     Weight 11/07/18 1214 135 lb (61.2 kg)     Height 11/07/18 1214 5' 5"  (1.651 m)     Head Circumference --      Peak Flow --      Pain Score 11/07/18 1214 2     Pain Loc --      Pain Edu? --      Excl. in [place]? --     Constitutional: Alert and oriented.  Head: Normocephalic. Atraumatic. Eyes: Conjunctivae clear. Sclera anicteric. Nose: No congestion. No rhinorrhea. Mouth/Throat: Mucous membranes are moist.  Neck: No stridor.   Cardiovascular:  Normal rate, regular rhythm. No murmurs. Extremities well perfused. Respiratory: Normal respiratory effort.  Lungs CTAB. Gastrointestinal: Soft. Non-tender. Non-distended.  Musculoskeletal: No lower extremity edema. No deformities. Neurologic:  Normal speech and language. No gross focal neurologic deficits are appreciated.  Skin: Skin is warm, dry and intact. No rash noted. Psychiatric: Mood and affect are appropriate for situation.  EKG  Personally reviewed.   Rate: 80s Rhythm: sinus Axis: normal Intervals: WNL No acute ischemic changes No e/o Brug[place], WPW, or long QT No STEMI    Radiology  XR: IMPRESSION:  No active cardiopulmonary disease.    Procedures  Procedure(s) performed (including critical care):  Procedures   Initial Impression / Assessment and Plan / ED Course  33 y.o. male who presents to the ED for palpitations, chest  discomfort have large caffeine intake.  Ddx: suspect symptoms likely caffeine related, consider anemia, electrolyte abnormality, atypical ACS  Plan: labs, EKG, XR  HS troponin negative, no ischemic changes on EKG or e/o arrhythmia. XR negative. As such will plan for discharge. Discussed appropriate caffeine intake. Advised PCP and given return precautions, he voices understanding of this and is comfortable with d/c.     Final Clinical Impression(s) / ED Diagnosis  Final diagnoses:  Palpitations  Chest discomfort       Note:  This document was prepared using Dragon voice recognition software and may include unintentional dictation errors.   Lilia Pro., MD 11/08/18 [phone]

## 2018-11-10 ENCOUNTER — Other Ambulatory Visit: Payer: Self-pay | Admitting: Psychiatry

## 2018-11-10 DIAGNOSIS — F0630 Mood disorder due to known physiological condition, unspecified: Secondary | ICD-10-CM

## 2018-11-14 ENCOUNTER — Encounter: Payer: Self-pay | Admitting: Gastroenterology

## 2018-11-14 ENCOUNTER — Other Ambulatory Visit: Payer: Self-pay

## 2018-11-14 ENCOUNTER — Ambulatory Visit: Payer: BC Managed Care – PPO | Admitting: Gastroenterology

## 2018-11-14 VITALS — BP 97/68 | HR 103 | Temp 98.1°F | Wt 150.0 lb

## 2018-11-14 DIAGNOSIS — K50812 Crohn's disease of both small and large intestine with intestinal obstruction: Secondary | ICD-10-CM | POA: Diagnosis not present

## 2018-11-14 DIAGNOSIS — K5080 Crohn's disease of both small and large intestine without complications: Secondary | ICD-10-CM

## 2018-11-14 DIAGNOSIS — Z23 Encounter for immunization: Secondary | ICD-10-CM

## 2018-11-14 DIAGNOSIS — R197 Diarrhea, unspecified: Secondary | ICD-10-CM

## 2018-11-14 DIAGNOSIS — Z79899 Other long term (current) drug therapy: Secondary | ICD-10-CM | POA: Insufficient documentation

## 2018-11-14 DIAGNOSIS — K50818 Crohn's disease of both small and large intestine with other complication: Secondary | ICD-10-CM | POA: Insufficient documentation

## 2018-11-14 MED ORDER — NA SULFATE-K SULFATE-MG SULF 17.5-3.13-1.6 GM/177ML PO SOLN: 354.0000 mL | Freq: Once | ORAL | 0 refills | Status: AC

## 2018-11-14 NOTE — Addendum Note (Signed)
Addended by: Sherri Sear R on: 11/14/2018 12:06 PM   Modules accepted: Orders

## 2018-11-14 NOTE — Progress Notes (Signed)
we'll  [name], MD [address]  [place]  [place], [place] [address]  Main: [phone]  Fax: [phone]    Gastroenterology Consultation  Referring Provider:     [name], MD Primary Care Physician:  [name], MD Primary Gastroenterologist:  Dr. [name] Reason for Consultation:     Crohn's disease        HPI:   Leroy Hernandez is a 33 y.o. y/o male referred by Dr. Ouida Sills, [name], MD  for consultation & management of Crohn's disease. He has history of ileocolonic Crohn's diagnosed in 2007, detailed history described below is referred by [name] to establish care. He is currently on cymzia 400 mg every 2 weeks and Imuran 150 mg daily. Currently, he is experiencing nonbloody diarrhea up to 10 times a day associated with urgency and perianal purulent discharge from the fistula. He also reports tenderness in the right perianal area. He reports left-sided abdominal pain associated with nausea but no vomiting. He denies bloating, blood in the stools. He went to [facility] ER 2 days ago due to severe diarrhea and head is given prescription for oral prednisone but he did not filling the prescription yet. He denies fever, chills, loss of appetite, weight loss. He has been adherent with his medications.   Follow up visit 01/10/2017: Since last visit, I started him on entocort, cipro, cortenema for flare up of his symptoms. He gained about 5lbs since last visit. 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 Remicade process was started. He reports some rectal urgency and minimal rectal bleeding mostly on wiping. When I went over medications with him, he doesn't really know what he is taking and he did not bring his medication bottles with him even though I reminded him during last visit. He is not on entocort. He has seen endocrine and also hematology for iron deficiency anemia and scheduled for iron  infusion today  Follow-up visit 05/03/2017 He started on Remicade, completed induction. Stopped Imuran, started on Weekly methotrexate along with daily folate. He reports that he has been tolerating Remicade infusions very well. He gained weight. His stools are more formed, no recurrence of perianal fistulas. He denies rectal bleeding or bloody diarrhea. She recently had flareup of symptoms, was found to have norovirus as well as C. Difficile toxin positive when he went to emergency room on 04/30/2017. E also has significant leukocytosis. Underwent CT scan which did not show acute GI pathology. He was hospitalized for 1 day, discharged on oral vancomycin 125 mg 4 times a day. Patient is here for follow-up. He reports that his diarrhea is improving, currently having 4 semi-formed bowel movements daily without any blood. His appetite is improving.He reports being adherent to the medications. He denies any other complaints today. He reports that his energy levels are better, able to work longer hours.  Follow-up visit 07/20/2017 He went to [facility] ER 2 days ago due to one-week history of severe nonbloody diarrhea, fatigue, chills, nausea. He said he ate spaghetti with chicken and beef about a week ago, started experiencing several episodes of nonbloody diarrhea, particularly postprandial and sometimes at night. He also reports he may have had mild flareup of the perianal fistula drained and currently not draining much. In the ER, he had CBC and CMP which were unremarkable, GI pathogen panel and C. Difficile studies were negative. He is due for next dose of Remicade tomorrow. He  reports taking methotrexate weekly along with folate daily.  Follow up visit 09/09/2017 Leroy Hernandez has not been doing well since last visit. I gave him 4 weeks of prednisone course because of flareup of his GI symptoms including abdominal pain, bloating, urgency, loss of appetite, weight loss. He lost about 7 pounds  since last visit. Stool studies came back negative for infection. He reports taking methotrexate but missed last 2 doses as his fianc misplaced the medicine as they were cleaning up the house for sale. He is due for her next Remicade on August 8. He also reports perianal burning, discomfort, flare up of hemorrhoids, blood on wiping, diarrhea.  He went to ER 3 days ago due to ongoing symptoms. Repeat C. Difficile and GI pathogen panel came back negative  Follow-up visit 11/29/2017 Patient was hospitalized to [facility] in early 09/2017 secondary to flareup of his Crohn's disease.  He underwent colonoscopy which showed improvement in his disease on Remicade 5 mg/kg from i4 to i2.  He received IV steroids, 2 weeks course of Cipro and Flagyl, Rowasa enemas for 2 weeks.  His Remicade trough levels were subtherapeutic, therefore increased to 10 mg/kg.  He is here for follow-up since the discharge.  He received first high-dose Remicade on 11/22/2017. He reports doing very well, in clinical remission since hospital discharge.  He regained 10 pounds in last 2 months, denies abdominal pain, nausea or vomiting.  His appetite has significantly improved.  He reports having 2-3 formed bowel movements daily, without rectal bleeding or rectal pain.  He denies any perianal lesions or discharge.  He continues to take methotrexate 25 mg weekly along with folate daily.  He is also taking Entocort.  He stopped Rowasa enema.  Follow-up visit 11/14/2018 Patient has not seen me in last 1 year.  He felt he did not need follow-up as he was doing good.  He continues to take Remicade 10 mg/KG body weight every 8 weeks.  He ran out of methotrexate about 3 weeks ago.  Over the last 2 weeks, he has been experiencing postprandial urgency noticing streaks of blood.  He had temporary drainage of the perianal fistula but stopped at present it was tender as well.  He no longer has tenderness or drainage of his fistula.  He denies abdominal pain,  nausea or vomiting.  His weight has been stable.  He was seen by [facility] dermatology for evaluation of skin lesion on his left temple, confirmed squamous cell carcinoma in situ, stage I which was treated with Mohs surgery.  The scar looks completely healed.  He does not have a follow-up with dermatology yet.  He needs to see endocrinology for follow-up of osteoporosis.  He cancel his bone density testing and waiting to reschedule  Crohn's disease classification:  Age: 74 to 73 Location: ileocolonic  Behavior: stricturing and penetrating  Perianal: Yes  IBD diagnosis:2007  Disease course: He was diagnosed in 2007, was experiencing right lower quadrant pain and bloating, was on oral mesalamine, several courses of prednisone, underwent first surgery in 2011 of transverse colon resection at [facility]. He continued to have diarrhea and required repeated courses of prednisone, established care at [facility] and underwent colonoscopy which revealed severe ileocolonic disease, underwent ileocolonic resection in 11/2011. He reports being started on Humira and Imuran since then and was doing well for some time. He was also taking marijuana and it significantly helped with his symptoms. Subsequently, he had frequent flareups, several hospitalizations and ER visits almost every  month secondary to perianal fistula, abscess and bloody diarrhea. He was receiving short courses of antibiotics. He had I&D of right perirectal abscesses in May/2017. He does not recall having a seton placement. His colonoscopy in 03/2015 revealed mild active colitis and anastomotic stricture that was dilated with balloon to 12 mm. He underwent another colonoscopy in 12/2015 which revealed inflammation in the neoterminal ileum as well as colon and was found to have moderately active ileocolonic disease. He was then switched to [place] in 05/2016 and stayed on Imuran 150 mg daily. He was feeling better overall but continued to have  active perianal disease resulting in urgency and bloody stools. CT A/P from 06/2016 revealed right perirectal fistula with developing fluid collection concerning for abscess. MRE from 09/2016 revealed no evidence of active disease, however the fistula could not be visualized. His CRP improved from 10 in 06/2016 -1.7 in 09/2016. 09/2016 Trial of cipro+flagyl BID and rowasa enema resulted in significant improvement of symptoms. 11/2016 Return of fistula and rectal bleeding after tapering antibiotic. 12/2016 Restarted cipro, entocort, cortenema, decreased imuran to 6m. EGD and colonoscopy revealed gastric granuloma, Rutgeert's i4. Stool studies negative. Fecal calprotectin 370. C. Difficile infection in 04/2017, treated with oral vancomycin. Flareup of Crohn's since 07/2017, took 4 weeks course of prednisone while on Remicade and methotrexate, mild improvement. Stool studies negative for infection including C. Difficile.  [facility], IV steroids, 2 weeks antibiotics, Rowasa enemas, colonoscopy showed progression of disease from i4 to i2. Remicade trough levels 2.4, undetectable antibodies.  Increased Remicade to 10 mg/kg, first maintenance dose in 11/2017  Extra intestinal manifestations: Imaging revealed chronic sacroiliitis. He denies skin, eye manifestations  IBD surgical history: 2011 : Intestinal resection, transverse colon Diagnosis:  TRANSVERSE COLON AND PROXIMAL TRANSVERSE COLON RESECTION:  SEGMENTS OF BOWEL WITH MODERATE TO FOCALLY SEVERE CHRONIC ACTIVE COLITIS.  EXTENSIVE TRANSMURAL INFLAMMATION.  CRYTPITIS AND CRYPT ABSCESSES ARE NOTED.  THE RESECTION MARGINS APPEAR CLEAR.  THERE IS NO EVIDENCE OF DYSPLASIA OR MALIGNANCY.  NINETEEN BENIGN LYMPH NODES. (0/19)  [facility] 9 cm TI, 5.5 cm long large bowel Diagnosis:  A:Ileum and colon, resection  -Segment of ileum and colon with moderate to severely active chronic    enteritis, mildly active chronic colitis, and intact anastomosis at distal end  of specimen  -Surgical margins are viable; mildly active chronic colitis is present at  distal margin  -Apparent stricture near ileocecal valve  -Inflammatory polyp near ileocecal valve  -Fibrous obliteration of appendiceal lumen  -Six unremarkable lymph nodes examined microscopically  -No granulomas, viral cytopathic effect, or dysplasia identified   07/04/2015-incision and drainage of right perirectal abscess Imaging:  MRE-09/21/2016 IMPRESSION: 1. Stable surgical changes from prior colon resection and ileal colonic anastomosis. No findings for active Crohn's disease. 2. No acute abdominal/pelvic findings, mass lesions or adenopathy.  CT A/P 06/22/2016 IMPRESSION: 1. The fistula between the right lateral aspect of the rectum and the skin is again identified. The portion of the fistula immediately beneath the skin is more prominent in caliber in the interval with central decreased attenuation worrisome for a developing fluid collection in the distal fistula immediately beneath the cutaneous surface. A developing abscess cannot be excluded. Recommend clinical correlation. 2. Fluid throughout the remaining colon consistent with history of diarrhea. No colonic or small bowel inflammation identified. The rectum is poorly evaluated due to lack of distention. 3. 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  SBFT  none  Procedures: Colonoscopy 09/2009 Diagnosis:  TRANSVERSE COLON MUCOSA COLD BIOPSY:  - MODERATE CHRONIC ACTIVE COLITIS, CONSISTENT WITH PATIENTS  HISTORY OF CROHNS DISEASE.  - NEGATIVE FOR DYSPLASIA AND MALIGNANCY.   Colonoscopy 06/2010 Diagnosis:  ULCERATED RIGHT SIDED COLON BIOPSY:  - MILD CHRONIC ACTIVE COLITIS, COMPATIBLE WITH PATIENTS KNOWN  HISTORY OF CROHNS DISEASE.  - NEGATIVE FOR DYSPLASIA AND MALIGNANCY.   Colonoscopies  07/2012 Diagnosis:  ANASTOMOTIC STRICTURE COLD BIOPSY:  - MILD CHRONIC ACTIVE COLITIS, COMPATIBLE WITH PATIENTS KNOWN  HISTORY OF CROHNS DISEASE.  - NEGATIVE FOR DYSPLASIA AND MALIGNANCY.   Colonoscopies 07/2013 Diagnosis:  ILEUM BIOPSY:  - SMALL BOWEL MUCOSA WITH PRESERVED VILLOUS ARCHITECTURE.  - NEGATIVE FOR INTRAEPITHELIAL LYMPHOCYTOSIS, DYSPLASIA AND  MALIGNANCY.   Colonoscopy 03/2015 Multiple ulcers in the sigmoid colon, no bleeding was present biopsies were taken, tight stricture at the ileocolonic anastomosis unable to get through scope, dilated with 12 mm TTS balloon. The terminal ileum appeared normal DIAGNOSIS:  A. SIGMOID COLON; COLD BIOPSY:  - MILD CHRONIC ACTIVE COLITIS.  - NEGATIVE FOR DYSPLASIA AND MALIGNANCY.   Colonoscopy 12/2015 There was evidence of prior end-to-end ileocolonic anastomosis in the transverse colon. This was characterized by mild stenosis. The anastomosis was traversed. Scattered inflammation, moderate in severity and characterized by shallow ulcerations was found in the proximal ileum. Biopsies were taken with the cold forceps for histology. Inflammation characterized by shallow ulcerations were found no sites were spared. This was moderate in severity. Biopsies were taken. Perianal fistula found on perianal exam. Pathology: Small intestine-severe chronic active ileitis with ulceration. 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 - Patent functional end-to-end ileo-colonic anastomosis, characterized by mild stenosis. Dilated to 21m. - Ulcerated mucosa in the neo-terminal ileum. Biopsied. - Mucosal ulceration in  colon. Biopsied. - Active ileocolonic Crohn's disease, Rutgeert's i4 on cimzia and imuran - The distal rectum and anal verge are normal on retroflexion view.  Upper Endoscopy 12/2016 - Normal second portion of the duodenum and third portion of the duodenum. Biopsied. - Multiple non-bleeding duodenal ulcers with a clean ulcer base ([place] Class III). Biopsied. - Normal stomach. Biopsied. - Normal gastroesophageal junction and esophagus.   Colonoscopy 09/14/2017 Perianal fistula and perianal skin tag found on perianal exam. - Patent functional end-to-end ileo-colonic anastomosis, characterized by edema, erythema and friable mucosa. - A few ulcers in the neo-terminal ileum. Rutgeert's i2 improved from i4 disease on remicade - Congested and superficial ulcerated mucosa in the rectum and in the recto-sigmoid colon. Biopsied.  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 Cimzia started in 05/28/2016 along with Imuran. Cimzia stopped in 01/2017. Remicade 5 mg/kg started in 02/2017, maintenance every 8 weeks, infliximab trough levels 2.4ug/ml on 09/09/2017, antibodies less than 22, increased Remicade to 10 mg/kg in 11/2017 Anti Integrins:  Ustekinumab: Tofactinib: Clinical trial:   Past Medical History:  Diagnosis Date   Anxiety    Asthma    Blood in stool    Calculus of gallbladder without cholecystitis without obstruction 12/25/2015   Crohn's disease ([place])    Depression    Exacerbation of Crohn's  disease ([place]) 09/10/2017   Gallbladder polyp 12/25/2015   Gastroenteritis due to norovirus 05/03/2017   Generalized abdominal pain    H/O Clostridium difficile infection 05/03/2017   Iron deficiency anemia 12/28/2016   Perirectal abscess 07/04/2015   Overview:  Added automatically from request for surgery [id number]   Rectal fistula    Sepsis ([place]) 06/22/2016    Past Surgical History:  Procedure Laterality Date   COLON RESECTION     COLON SURGERY     COLONOSCOPY WITH PROPOFOL N/A 04/03/2015   Procedure: COLONOSCOPY WITH PROPOFOL;  Surgeon: Hulen Luster, MD;  Location: [facility] ENDOSCOPY;  Service: Endoscopy;  Laterality: N/A;   COLONOSCOPY WITH PROPOFOL N/A 12/22/2015   Procedure: COLONOSCOPY WITH PROPOFOL;  Surgeon: [name], MD;  Location: [place];  Service: Endoscopy;  Laterality: N/A;   COLONOSCOPY WITH PROPOFOL N/A 12/22/2016   Procedure: COLONOSCOPY WITH PROPOFOL;  Surgeon: Lin Landsman, MD;  Location: [place];  Service: Endoscopy;  Laterality: N/A;   COLONOSCOPY WITH PROPOFOL N/A 09/14/2017   Procedure: COLONOSCOPY WITH PROPOFOL;  Surgeon: Lin Landsman, MD;  Location: [facility] ENDOSCOPY;  Service: Gastroenterology;  Laterality: N/A;   ESOPHAGOGASTRODUODENOSCOPY (EGD) WITH PROPOFOL N/A 12/22/2016   Procedure: ESOPHAGOGASTRODUODENOSCOPY (EGD) WITH PROPOFOL;  Surgeon: Lin Landsman, MD;  Location: [place];  Service: Endoscopy;  Laterality: N/A;   rectal fistula surgery     WRIST SURGERY       Current Outpatient Medications:    busPIRone (BUSPAR) 5 MG tablet, Take 1 tablet (5 mg total) by mouth 2 (two) times daily., Disp: 180 tablet, Rfl: 1   escitalopram (LEXAPRO) 10 MG tablet, Take 1 tablet (10 mg total) by mouth daily., Disp: 90 tablet, Rfl: 1   hydrOXYzine (VISTARIL) 25 MG capsule, Take 1 capsule (25 mg total) by mouth daily as needed (severe anxiety attacks)., Disp: 90 capsule, Rfl: 1   acetaminophen (TYLENOL) 325 MG  tablet, Take 2 tablets (650 mg total) by mouth every 6 (six) hours as needed for mild pain (or Fever >/= 101). (Patient not taking: Reported on 11/14/2018), Disp: , Rfl:    ARIPiprazole (ABILIFY) 5 MG tablet, TAKE 1 TABLET BY MOUTH EVERYDAY AT BEDTIME (Patient not taking: Reported on 11/14/2018), Disp: 30 tablet, Rfl: 0   folic acid (FOLVITE) 1 MG tablet, TAKE 1 TABLET BY MOUTH EVERY DAY. TAKE 2 TABLETS ON DAY WHEN YOU TAKE METHOTREXATE (Patient not taking: Reported on 11/14/2018), Disp: 90 tablet, Rfl: 0   mesalamine (ROWASA) 4 g enema, Place 60 mLs (4 g total) rectally at bedtime. (Patient not taking: Reported on 11/14/2018), Disp: 14 Bottle, Rfl: 0   methotrexate (RHEUMATREX) 2.5 MG tablet, TAKE 10 TABLETS BY MOUTH ONCE WEEKLY ALONG WITH DAILY FOLATE (Patient not taking: Take 10 tablets by mouth once weekly along with daily folate), Disp: 120 tablet, Rfl: 1   Na Sulfate-K Sulfate-Mg Sulf 17.5-3.13-1.6 GM/177ML SOLN, Take 354 mLs by mouth once for 1 dose., Disp: 354 mL, Rfl: 0   Family History  Problem Relation Age of Onset   Diabetes Paternal Grandfather    Heart disease Paternal Grandfather    Depression Mother    Drug abuse Mother    Osteoporosis Neg  Hx      Social History   Tobacco Use   Smoking status: Never Smoker   Smokeless tobacco: Former Systems developer  Substance Use Topics   Alcohol use: Yes    Frequency: Never   Drug use: No    Comment: hx of 3 year ago     Allergies as of 11/14/2018   (No Known Allergies)    Review of Systems:    All systems reviewed and negative except where noted in HPI.   Physical Exam:  BP 97/68 (BP Location: Left Arm, Patient Position: Sitting, Cuff Size: Normal)    Pulse (!) 103    Temp 98.1 F (36.7 C) (Oral)    Wt 150 lb (68 kg)    BMI 24.96 kg/m  No LMP for male patient. Filed Weights   11/14/18 1106  Weight: 150 lb (68 kg)   General:   Alert, well-built, well-nourished, pleasant and cooperative in NAD Head:  Normocephalic and  atraumatic. Eyes:  Sclera clear, no icterus.   Conjunctiva pale. Ears:  Normal auditory acuity. Nose:  No deformity, discharge, or lesions. Mouth:  No deformity or lesions,oropharynx pink & moist. Neck:  Supple; no masses or thyromegaly. Lungs:  Respirations even and unlabored.  Clear throughout to auscultation.   No wheezes, crackles, or rhonchi. No acute distress. Heart:  Regular rate and rhythm; no murmurs, clicks, rubs, or gallops. Abdomen:  Normal bowel sounds.  No bruits.  Soft, non-tender nondistended without masses, hepatosplenomegaly or hernias noted.  No guarding or rebound tenderness.  Midline vertical scar from prior surgery Rectal: Perianal exam revealed cutaneous fistula, not draining, nontender Msk:  Symmetrical without gross deformities. Good, equal movement & strength bilaterally. Pulses:  Normal pulses noted. Extremities:  No clubbing or edema.  No cyanosis. Neurologic:  Alert and oriented x3;  grossly normal neurologically. Skin:  Intact without significant lesions or rashes. No jaundice, well-healed scar in the left temple. Psych:  Alert and cooperative. Normal mood and affect.   Assessment and Plan:   ORY ELTING is a 33 y.o. caucasian male with Ileocolonic Crohn's disease diagnosed in 2007, stricturing and penetrating with perianal fistula, previously on Humira and Imuran, recurrence of postoperative Crohn's, resulted in discontinuation of Humira. Switched to [place] in 05/2016 biweekly and continued Imuran 150 mg daily which resulted in clinical remission followed by recurrence of luminal disease and perianal fistula that responded to antibiotics, then recurrence of symptoms after tapering abx, responded to entocort, EGD and colonoscopy 12/2016 revealed gastric crohn's and post op recurrence of Crohn's Rutgeert's i4, anastomotic stricture and status post dilation. Switched to Remicade and methotrexate.  Temporarily in clinical remission, episode of C. Difficile treated  with oral vancomycin, followed by flareup, currently on high-dose Remicade along with methotrexate, in clinical remission   Crohn's disease: 1. Continue high-dose Remicade, maintenance at 8 weeks interval. 2. Hold methotrexate for now  3. Recommend colonoscopy for disease assessment and dose adjustment 4.  Reiterated to the patient about importance of regular follow-ups at least every 6 months if disease is under control, to monitor for any side effects and medication tolerance while he is on Remicade  Acute diarrhea Check fecal calprotectin GI pathogen panel to rule out C. difficile  IBD Health Maintenance  1.TB status: PPD skin test negative as of October, 2017, quantiferon gold Negative, checked on 07/20/2017.  We will have to update QuantiFERON gold test 2. Anemia: had iron deficiency anemia secondary to active Crohn's disease, resolved. Received parenteral iron infusions, 2, vitamin  B12 is 397. Last ferritin level 114 in 07/2017 3.Immunizations: Hep A and B not immune, recommend Twinrix vaccine, first dose administered in 2019, did not follow-up with a series.  Restart series today, influenza-received annual influenza vaccine, last received in October/2019, received Prevnar in 2019 and administer pneumovax today 4.Cancer screening I) Colon cancer/dysplasia surveillance: Last colonoscopy in 12/2016, no dysplasia II) Cervical cancer: N/A III) Skin cancer -SCC I-S, stage I in left temple, status post Mohs surgery.  Counseled about annual skin exam by dermatology and skin protection in summer using sun screen SPF > 50, clothing 5.Bone health: Has osteoporosis, being followed by endocrine Vitamin D status: Normal as of 09/2016 Bone density testing: DEXA scan 09/29/2016 revealed osteoporosis, Z-score -3.3 5. Labs: Recheck labs every 57month 6. Smoking: Never smoker 7. NSAIDs and Antibiotics use: No history of NSAID use  Follow up in 367month    RoCephas DarbyMD

## 2018-11-15 ENCOUNTER — Telehealth: Payer: Self-pay

## 2018-11-15 NOTE — Telephone Encounter (Signed)
Faxed form.

## 2018-11-15 NOTE — Telephone Encounter (Signed)
pt girlfriend called states that pt is out of medications and that he not seeing dr. Nicolasa Ducking until the middle of jan.

## 2018-11-15 NOTE — Telephone Encounter (Signed)
Talk to patient fiance and she states his last does was 2 weeks ago and she thinks he gets this every 8 weeks

## 2018-11-15 NOTE — Telephone Encounter (Signed)
Leroy Hernandez the drug rep sent [place] a email for this patient about patient getting the [facility] and he will do at home on weekends. I printed off the form she attached. They need a TB screening test. Patient had this done 07/20/17. Will he need to get this repeated.

## 2018-11-15 NOTE — Telephone Encounter (Signed)
I will be willing to see him again if he wants for med management .

## 2018-11-15 NOTE — Telephone Encounter (Signed)
He is on remicade, not entyvio and yes, we need to update TB test. But, go ahead and send what we have now. Also, you have to find out with him when his last remicade infusion was. He is on 102m/kg every 8 weeks  Thanks RV

## 2018-11-17 ENCOUNTER — Other Ambulatory Visit: Payer: Self-pay | Admitting: Psychiatry

## 2018-11-17 DIAGNOSIS — F0630 Mood disorder due to known physiological condition, unspecified: Secondary | ICD-10-CM

## 2018-11-19 LAB — C DIFFICILE, CYTOTOXIN B

## 2018-11-19 LAB — C DIFFICILE TOXINS A+B W/RFLX: C difficile Toxins A+B, EIA: NEGATIVE

## 2018-11-20 LAB — GI PROFILE, STOOL, PCR

## 2018-11-20 LAB — CALPROTECTIN, FECAL: Calprotectin, Fecal: 600 ug/g — ABNORMAL HIGH (ref 0–120)

## 2018-11-21 ENCOUNTER — Other Ambulatory Visit: Payer: Self-pay

## 2018-11-21 DIAGNOSIS — K50812 Crohn's disease of both small and large intestine with intestinal obstruction: Secondary | ICD-10-CM

## 2018-11-22 ENCOUNTER — Other Ambulatory Visit
Admission: RE | Admit: 2018-11-22 | Discharge: 2018-11-22 | Disposition: A | Discharge status: Home / Self Care | Payer: BC Managed Care – PPO | Source: Ambulatory Visit | Attending: Gastroenterology | Admitting: Gastroenterology

## 2018-11-22 DIAGNOSIS — K50812 Crohn's disease of both small and large intestine with intestinal obstruction: Secondary | ICD-10-CM | POA: Diagnosis present

## 2018-11-23 ENCOUNTER — Other Ambulatory Visit: Payer: Self-pay | Admitting: Psychiatry

## 2018-11-23 DIAGNOSIS — F3341 Major depressive disorder, recurrent, in partial remission: Secondary | ICD-10-CM

## 2018-11-23 DIAGNOSIS — F411 Generalized anxiety disorder: Secondary | ICD-10-CM

## 2018-11-29 ENCOUNTER — Other Ambulatory Visit: Payer: Self-pay

## 2018-11-29 ENCOUNTER — Encounter: Payer: Self-pay | Admitting: Emergency Medicine

## 2018-11-29 ENCOUNTER — Inpatient Hospital Stay
Admission: EM | Admit: 2018-11-29 | Discharge: 2018-12-01 | DRG: 386 | Disposition: A | Discharge status: Home / Self Care | Payer: BC Managed Care – PPO | Attending: Specialist | Admitting: Specialist

## 2018-11-29 ENCOUNTER — Emergency Department: Payer: BC Managed Care – PPO

## 2018-11-29 DIAGNOSIS — K50911 Crohn's disease, unspecified, with rectal bleeding: Secondary | ICD-10-CM | POA: Diagnosis not present

## 2018-11-29 DIAGNOSIS — E876 Hypokalemia: Secondary | ICD-10-CM | POA: Diagnosis present

## 2018-11-29 DIAGNOSIS — F339 Major depressive disorder, recurrent, unspecified: Secondary | ICD-10-CM | POA: Diagnosis present

## 2018-11-29 DIAGNOSIS — K633 Ulcer of intestine: Secondary | ICD-10-CM | POA: Diagnosis present

## 2018-11-29 DIAGNOSIS — M810 Age-related osteoporosis without current pathological fracture: Secondary | ICD-10-CM | POA: Diagnosis present

## 2018-11-29 DIAGNOSIS — Z8619 Personal history of other infectious and parasitic diseases: Secondary | ICD-10-CM

## 2018-11-29 DIAGNOSIS — Z87891 Personal history of nicotine dependence: Secondary | ICD-10-CM

## 2018-11-29 DIAGNOSIS — J4520 Mild intermittent asthma, uncomplicated: Secondary | ICD-10-CM | POA: Diagnosis present

## 2018-11-29 DIAGNOSIS — Z9049 Acquired absence of other specified parts of digestive tract: Secondary | ICD-10-CM | POA: Diagnosis not present

## 2018-11-29 DIAGNOSIS — R197 Diarrhea, unspecified: Secondary | ICD-10-CM | POA: Diagnosis not present

## 2018-11-29 DIAGNOSIS — K50111 Crohn's disease of large intestine with rectal bleeding: Secondary | ICD-10-CM

## 2018-11-29 DIAGNOSIS — Z8719 Personal history of other diseases of the digestive system: Secondary | ICD-10-CM | POA: Diagnosis not present

## 2018-11-29 DIAGNOSIS — Z20828 Contact with and (suspected) exposure to other viral communicable diseases: Secondary | ICD-10-CM | POA: Diagnosis present

## 2018-11-29 DIAGNOSIS — K50811 Crohn's disease of both small and large intestine with rectal bleeding: Secondary | ICD-10-CM | POA: Diagnosis not present

## 2018-11-29 DIAGNOSIS — Z79899 Other long term (current) drug therapy: Secondary | ICD-10-CM

## 2018-11-29 DIAGNOSIS — K5010 Crohn's disease of large intestine without complications: Secondary | ICD-10-CM | POA: Diagnosis present

## 2018-11-29 DIAGNOSIS — K644 Residual hemorrhoidal skin tags: Secondary | ICD-10-CM | POA: Diagnosis present

## 2018-11-29 DIAGNOSIS — K603 Anal fistula: Secondary | ICD-10-CM | POA: Diagnosis present

## 2018-11-29 DIAGNOSIS — Z980 Intestinal bypass and anastomosis status: Secondary | ICD-10-CM | POA: Diagnosis not present

## 2018-11-29 LAB — C-REACTIVE PROTEIN: CRP: 5.8 mg/dL — ABNORMAL HIGH (ref <1.0)

## 2018-11-29 LAB — COMPREHENSIVE METABOLIC PANEL
ALT: 15 U/L (ref 0–44)
AST: 16 U/L (ref 15–41)
Albumin: 3.6 g/dL (ref 3.5–5.0)
Alkaline Phosphatase: 81 U/L (ref 38–126)
Anion gap: 12 (ref 5–15)
BUN: 7 mg/dL (ref 6–20)
CO2: 26 mmol/L (ref 22–32)
Calcium: 8.5 mg/dL — ABNORMAL LOW (ref 8.9–10.3)
Chloride: 102 mmol/L (ref 98–111)
Creatinine, Ser: 0.97 mg/dL (ref 0.61–1.24)
GFR calc Af Amer: >60 mL/min (ref >60)
GFR calc non Af Amer: >60 mL/min (ref >60)
Glucose, Bld: 95 mg/dL (ref 70–99)
Potassium: 3 mmol/L — ABNORMAL LOW (ref 3.5–5.1)
Sodium: 140 mmol/L (ref 135–145)
Total Bilirubin: 1 mg/dL (ref 0.3–1.2)
Total Protein: 7.2 g/dL (ref 6.5–8.1)

## 2018-11-29 LAB — CBC WITH DIFFERENTIAL/PLATELET
Abs Immature Granulocytes: 0.04 10*3/uL (ref 0.00–0.07)
Basophils Absolute: 0 10*3/uL (ref 0.0–0.1)
Basophils Relative: 0 %
Eosinophils Absolute: 0.1 10*3/uL (ref 0.0–0.5)
Eosinophils Relative: 1 %
HCT: 40.8 % (ref 39.0–52.0)
Hemoglobin: 13.5 g/dL (ref 13.0–17.0)
Immature Granulocytes: 0 %
Lymphocytes Relative: 14 %
Lymphs Abs: 1.3 10*3/uL (ref 0.7–4.0)
MCH: 26.9 pg (ref 26.0–34.0)
MCHC: 33.1 g/dL (ref 30.0–36.0)
MCV: 81.3 fL (ref 80.0–100.0)
Monocytes Absolute: 1.2 10*3/uL — ABNORMAL HIGH (ref 0.1–1.0)
Monocytes Relative: 13 %
Neutro Abs: 6.8 10*3/uL (ref 1.7–7.7)
Neutrophils Relative %: 72 %
Platelets: 280 10*3/uL (ref 150–400)
RBC: 5.02 MIL/uL (ref 4.22–5.81)
RDW: 12.8 % (ref 11.5–15.5)
WBC: 9.5 10*3/uL (ref 4.0–10.5)
nRBC: 0 % (ref 0.0–0.2)

## 2018-11-29 LAB — C DIFFICILE QUICK SCREEN W PCR REFLEX
C Diff antigen: NEGATIVE
C Diff interpretation: NOT DETECTED
C Diff toxin: NEGATIVE

## 2018-11-29 LAB — SARS CORONAVIRUS 2 (TAT 6-24 HRS): SARS Coronavirus 2: NEGATIVE

## 2018-11-29 LAB — MAGNESIUM: Magnesium: 2 mg/dL (ref 1.7–2.4)

## 2018-11-29 MED ORDER — ACETAMINOPHEN 650 MG RE SUPP: 650.0000 mg | Freq: Four times a day (QID) | RECTAL | Status: DC | PRN

## 2018-11-29 MED ORDER — ENOXAPARIN SODIUM 40 MG/0.4ML ~~LOC~~ SOLN
40.0000 mg | SUBCUTANEOUS | Status: DC
  Administered 2018-11-30: 40 mg via SUBCUTANEOUS
  Filled 2018-11-29: qty 0.4

## 2018-11-29 MED ORDER — ONDANSETRON HCL 4 MG PO TABS
4.0000 mg | ORAL_TABLET | Freq: Four times a day (QID) | ORAL | Status: DC | PRN
  Administered 2018-11-29: 4 mg via ORAL
  Filled 2018-11-29: qty 1

## 2018-11-29 MED ORDER — MORPHINE SULFATE (PF) 4 MG/ML IV SOLN
INTRAVENOUS | Status: AC
  Administered 2018-11-29: 4 mg via INTRAVENOUS
  Filled 2018-11-29: qty 1

## 2018-11-29 MED ORDER — METRONIDAZOLE IN NACL 5-0.79 MG/ML-% IV SOLN
500.0000 mg | Freq: Three times a day (TID) | INTRAVENOUS | Status: DC
  Administered 2018-11-29 – 2018-12-01 (×5): 500 mg via INTRAVENOUS
  Filled 2018-11-29 (×7): qty 100

## 2018-11-29 MED ORDER — MORPHINE SULFATE (PF) 4 MG/ML IV SOLN
4.0000 mg | INTRAVENOUS | Status: DC | PRN
  Administered 2018-11-29 – 2018-12-01 (×6): 4 mg via INTRAVENOUS
  Filled 2018-11-29 (×6): qty 1

## 2018-11-29 MED ORDER — POTASSIUM CHLORIDE CRYS ER 20 MEQ PO TBCR
40.0000 meq | EXTENDED_RELEASE_TABLET | Freq: Once | ORAL | Status: AC
  Administered 2018-11-29: 40 meq via ORAL
  Filled 2018-11-29: qty 2

## 2018-11-29 MED ORDER — PEG 3350-KCL-NA BICARB-NACL 420 G PO SOLR
4000.0000 mL | Freq: Once | ORAL | Status: AC
  Administered 2018-11-29: 4000 mL via ORAL
  Filled 2018-11-29: qty 4000

## 2018-11-29 MED ORDER — ONDANSETRON HCL 4 MG/2ML IJ SOLN
INTRAMUSCULAR | Status: AC
  Administered 2018-11-29: 4 mg via INTRAVENOUS
  Filled 2018-11-29: qty 2

## 2018-11-29 MED ORDER — SODIUM CHLORIDE 0.9 % IV BOLUS
1000.0000 mL | Freq: Once | INTRAVENOUS | Status: AC
  Administered 2018-11-29: 1000 mL via INTRAVENOUS

## 2018-11-29 MED ORDER — ONDANSETRON HCL 4 MG/2ML IJ SOLN: 4.0000 mg | Freq: Four times a day (QID) | INTRAMUSCULAR | Status: DC | PRN

## 2018-11-29 MED ORDER — CIPROFLOXACIN IN D5W 400 MG/200ML IV SOLN
400.0000 mg | Freq: Two times a day (BID) | INTRAVENOUS | Status: DC
  Administered 2018-11-29 – 2018-12-01 (×3): 400 mg via INTRAVENOUS
  Filled 2018-11-29 (×6): qty 200

## 2018-11-29 MED ORDER — PREDNISONE 20 MG PO TABS
40.0000 mg | ORAL_TABLET | Freq: Every day | ORAL | Status: DC
  Administered 2018-11-30 – 2018-12-01 (×2): 40 mg via ORAL
  Filled 2018-11-29 (×2): qty 2

## 2018-11-29 MED ORDER — PREDNISONE 20 MG PO TABS
40.0000 mg | ORAL_TABLET | Freq: Once | ORAL | Status: AC
  Administered 2018-11-29: 40 mg via ORAL
  Filled 2018-11-29: qty 2

## 2018-11-29 MED ORDER — ONDANSETRON HCL 4 MG/2ML IJ SOLN
4.0000 mg | Freq: Once | INTRAMUSCULAR | Status: AC
  Administered 2018-11-29: 11:00:00 4 mg via INTRAVENOUS

## 2018-11-29 MED ORDER — ACETAMINOPHEN 325 MG PO TABS
650.0000 mg | ORAL_TABLET | Freq: Four times a day (QID) | ORAL | Status: DC | PRN
  Administered 2018-11-29 (×2): 650 mg via ORAL
  Filled 2018-11-29 (×2): qty 2

## 2018-11-29 MED ORDER — ESCITALOPRAM OXALATE 10 MG PO TABS
10.0000 mg | ORAL_TABLET | Freq: Every day | ORAL | Status: DC
  Administered 2018-11-29 – 2018-12-01 (×3): 10 mg via ORAL
  Filled 2018-11-29 (×3): qty 1

## 2018-11-29 MED ORDER — SODIUM CHLORIDE 0.9 % IV SOLN
INTRAVENOUS | Status: DC | PRN
  Administered 2018-11-29: 17:00:00 via INTRAVENOUS
  Administered 2018-11-30: 1000 mL via INTRAVENOUS
  Administered 2018-11-30: 250 mL via INTRAVENOUS

## 2018-11-29 NOTE — ED Notes (Signed)
Pt ambulatory to toilet with steady gait noted. Hats placed in toilet to collect stool sample.

## 2018-11-29 NOTE — ED Notes (Addendum)
Pt c/o of rectal bleeding x few days. States has crohn's, unsure if flare up. States had blood work performed few weeks ago and showed "my inflammation markers are 3 times what they should be." states has a fistula near rectum x few years that sometimes bleeds. Unsure of hemorrhoids. C/o of rectal pain. States rectal bleeding is bright red. Denies blood thinner use. A&O. No distress noted at present. Sees Dr. [name] for GI, states has colonoscopy scheduled in November "but just can't wait"

## 2018-11-29 NOTE — ED Provider Notes (Signed)
[facility] Provider Note    First MD Initiated Contact with Patient 11/29/18 1007     (approximate)  I have reviewed the triage vital signs and the nursing notes.   HISTORY  Chief Complaint Abdominal Pain    HPI Ler[facility] is a 33 y.o. male below his past medical history on Remicade for Crohn's colitis presents to ER for several days of worsening abdominal pain now with multiple episodes of bloody diarrhea.  States he is having 10-15 episodes daily.  Not have any vomiting.  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    Past Medical History:  Diagnosis Date  . Anxiety   . Asthma   . Blood in stool   . Calculus of gallbladder without cholecystitis without obstruction 12/25/2015  . Crohn's disease ([place])   . Depression   . Exacerbation of Crohn's disease ([place]) 09/10/2017  . Gallbladder polyp 12/25/2015  . Gastroenteritis due to norovirus 05/03/2017  . Generalized abdominal pain   . H/O Clostridium difficile infection 05/03/2017  . Iron deficiency anemia 12/28/2016  . Perirectal abscess 07/04/2015   Overview:  Added automatically from request for surgery [id number]  . Rectal fistula   . Sepsis ([place]) 06/22/2016   Family History  Problem Relation Age of Onset  . Diabetes Paternal Grandfather   . Heart disease Paternal Grandfather   . Depression Mother   . Drug abuse Mother   . Osteoporosis Neg Hx    Past Surgical History:  Procedure Laterality Date  . COLON RESECTION    . COLON SURGERY    . COLONOSCOPY WITH PROPOFOL N/A 04/03/2015   Procedure: COLONOSCOPY WITH PROPOFOL;  Surgeon: Hulen Luster, MD;  Location: [facility] ENDOSCOPY;  Service: Endoscopy;  Laterality: N/A;  . COLONOSCOPY WITH PROPOFOL N/A 12/22/2015   Procedure: COLONOSCOPY WITH PROPOFOL;  Surgeon: [name], MD;  Location: [place];  Service: Endoscopy;  Laterality: N/A;  . COLONOSCOPY WITH PROPOFOL N/A 12/22/2016   Procedure: COLONOSCOPY WITH PROPOFOL;   Surgeon: Lin Landsman, MD;  Location: [place];  Service: Endoscopy;  Laterality: N/A;  . COLONOSCOPY WITH PROPOFOL N/A 09/14/2017   Procedure: COLONOSCOPY WITH PROPOFOL;  Surgeon: Lin Landsman, MD;  Location[facility] END[facility];  Service: Gastroenterology;  Laterality: N/A;  . ESOPHAGOGASTRODUODENOSCOPY (EGD) WITH PROPOFOL N/A 12/22/2016   Procedure: ESOPHAGOGASTRODUODENOSCOPY (EGD) WITH PROPOFOL;  Surgeon: Lin Landsman, MD;  Location: [place];  Service: Endoscopy;  Laterality: N/A;  . rectal fistula surgery    . WRIST SURGERY     Patient Active Problem List   Diagnosis Date Noted  . Other long term (current) drug therapy 11/14/2018  . Crohn's disease of both small and large intestine with other complication ([place]) 21/19/4174  . Iron deficiency anemia 12/28/2016  . Osteoporosis 12/09/2016  . Perirectal fistula   . Fracture of metacarpal bone 06/09/2016  . Intestinal bypass or anastomosis status   . Mild episode of recurrent major depressive disorder ([place]) 04/02/2015  . Asthma, mild intermittent 04/02/2015      Prior to Admission medications   Medication Sig Start Date End Date Taking? Authorizing Provider  busPIRone (BUSPAR) 5 MG tablet Take 1 tablet (5 mg total) by mouth 2 (two) times daily. Patient taking differently: Take 5 mg by mouth daily.  02/20/18  Yes Eappen, Ria Clock, MD  escitalopram (LEXAPRO) 10 MG tablet Take 1 tablet (10 mg total) by mouth daily. 02/20/18  Yes Ursula Alert, MD  hydrOXYzine (VISTARIL) 25 MG capsule  Take 1 capsule (25 mg total) by mouth daily as needed (severe anxiety attacks). 07/15/17  Yes Eappen, Ria Clock, MD  ARIPiprazole (ABILIFY) 5 MG tablet TAKE 1 TABLET BY MOUTH EVERYDAY AT BEDTIME Patient not taking: Reported on 11/14/2018 07/28/18   Ursula Alert, MD  inFLIXimab (REMICADE) 100 MG injection Inject 100 mg into the vein every 8 (eight) weeks.    [provider]  methotrexate (RHEUMATREX) 2.5 MG tablet TAKE  10 TABLETS BY MOUTH ONCE WEEKLY ALONG WITH DAILY FOLATE Patient not taking: Take 10 tablets by mouth once weekly along with daily folate 02/02/18   Vanga, Tally Due, MD    Allergies Patient has no known allergies.    Social History Social History   Tobacco Use  . Smoking status: Never Smoker  . Smokeless tobacco: Former Network engineer Use Topics  . Alcohol use: Yes    Frequency: Never  . Drug use: No    Comment: hx of 3 year ago     Review of Systems Patient denies headaches, rhinorrhea, blurry vision, numbness, shortness of breath, chest pain, edema, cough, abdominal pain, nausea, vomiting, diarrhea, dysuria, fevers, rashes or hallucinations unless otherwise stated above in HPI. ____________________________________________   PHYSICAL EXAM:  VITAL SIGNS: Vitals:   11/29/18 0956 11/29/18 1127  BP: 118/69 117/79  Pulse: 92 86  Resp: 18 16  Temp: 98.4 F (36.9 C)   SpO2: 98% 100%    Constitutional: Alert and oriented.  Eyes: Conjunctivae are normal.  Head: Atraumatic. Nose: No congestion/rhinnorhea. Mouth/Throat: Mucous membranes are moist.   Neck: No stridor. Painless ROM.  Cardiovascular: Normal rate, regular rhythm. Grossly normal heart sounds.  Good peripheral circulation. Respiratory: Normal respiratory effort.  No retractions. Lungs CTAB. Gastrointestinal: Soft and nontender. No distention. No abdominal bruits. No CVA tenderness. Genitourinary: His right perirectal fistula without any drainage.  No fluctuance.  Does have a small nonthrombosed hemorrhoid.  No evidence of blood per rectum.  No masses. Musculoskeletal: No lower extremity tenderness nor edema.  No joint effusions. Neurologic:  Normal speech and language. No gross focal neurologic deficits are appreciated. No facial droop Skin:  Skin is warm, dry and intact. No rash noted. Psychiatric: Mood and affect are normal. Speech and behavior are normal.  ____________________________________________    LABS (all labs ordered are listed, but only abnormal results are displayed)  Results for orders placed or performed during the hospital encounter of 11/29/18 (from the past 24 hour(s))  CBC with Differential/Platelet     Status: Abnormal   Collection Time: 11/29/18 10:16 AM  Result Value Ref Range   WBC 9.5 4.0 - 10.5 K/uL   RBC 5.02 4.22 - 5.81 MIL/uL   Hemoglobin 13.5 13.0 - 17.0 g/dL   HCT 40.8 39.0 - 52.0 %   MCV 81.3 80.0 - 100.0 fL   MCH 26.9 26.0 - 34.0 pg   MCHC 33.1 30.0 - 36.0 g/dL   RDW 12.8 11.5 - 15.5 %   Platelets 280 150 - 400 K/uL   nRBC 0.0 0.0 - 0.2 %   Neutrophils Relative % 72 %   Neutro Abs 6.8 1.7 - 7.7 K/uL   Lymphocytes Relative 14 %   Lymphs Abs 1.3 0.7 - 4.0 K/uL   Monocytes Relative 13 %   Monocytes Absolute 1.2 (H) 0.1 - 1.0 K/uL   Eosinophils Relative 1 %   Eosinophils Absolute 0.1 0.0 - 0.5 K/uL   Basophils Relative 0 %   Basophils Absolute 0.0 0.0 - 0.1 K/uL  Immature Granulocytes 0 %   Abs Immature Granulocytes 0.04 0.00 - 0.07 K/uL  Comprehensive metabolic panel     Status: Abnormal   Collection Time: 11/29/18 10:16 AM  Result Value Ref Range   Sodium 140 135 - 145 mmol/L   Potassium 3.0 (L) 3.5 - 5.1 mmol/L   Chloride 102 98 - 111 mmol/L   CO2 26 22 - 32 mmol/L   Glucose, Bld 95 70 - 99 mg/dL   BUN 7 6 - 20 mg/dL   Creatinine, Ser 0.97 0.61 - 1.24 mg/dL   Calcium 8.5 (L) 8.9 - 10.3 mg/dL   Total Protein 7.2 6.5 - 8.1 g/dL   Albumin 3.6 3.5 - 5.0 g/dL   AST 16 15 - 41 U/L   ALT 15 0 - 44 U/L   Alkaline Phosphatase 81 38 - 126 U/L   Total Bilirubin 1.0 0.3 - 1.2 mg/dL   GFR calc non Af Amer >60 >60 mL/min   GFR calc Af Amer >60 >60 mL/min   Anion gap 12 5 - 15   ____________________________________________ ____________________________  RADIOLOGY  I personally reviewed all radiographic images ordered to evaluate for the above acute complaints and reviewed radiology reports and findings.  These findings were personally discussed  with the patient.  Please see medical record for radiology report.  ____________________________________________   PROCEDURES  Procedure(s) performed:  Procedures    Critical Care performed: no ____________________________________________   INITIAL IMPRESSION / ASSESSMENT AND PLAN / ED COURSE  Pertinent labs & imaging results that were available during my care of the patient were reviewed by me and considered in my medical decision making (see chart for details).   DDX: Crohn's colitis, enteritis, abscess, perforation, diverticular bleed, hemorrhoids, fistula  Leroy [facility] is a 33 y.o. who presents to the ED with above described symptoms on Remicade presenting with worsening abdominal pain and bloody diarrhea.  Exam is reassuring and blood work is reassuring but patient's presentation complicated as he is on immunomodulatory medications with a history of Crohn's.  Does warrant CT imaging to evaluate for the by differential.  Will consult with GI.  Will give IV fluids as well as IV pain medication  Clinical Course as of Nov 29 1150  Wed Nov 29, 2018  1116 Discussed the case in consultation with Dr. [name] the patient's GI specialist.  Based on the extent of his bloody diarrhea and complicated history will discuss with hospitalist for admission for IV fluids stool studies steroids and probable colonoscopy.  Have discussed with the patient and available family all diagnostics and treatments performed thus far and all questions were answered to the best of my ability. The patient demonstrates understanding and agreement with plan.    [PR]    Clinical Course User Index [PR] Merlyn Lot, MD    The patient was evaluated in Emergency Department today for the symptoms described in the history of present illness. He/she was evaluated in the context of the global COVID-19 pandemic, which necessitated consideration that the patient might be at risk for infection with the SARS-CoV-2  virus that causes COVID-19. Institutional protocols and algorithms that pertain to the evaluation of patients at risk for COVID-19 are in a state of rapid change based on information released by regulatory bodies including the CDC and federal and state organizations. These policies and algorithms were followed during the patient's care in the ED.  As part of my medical decision making, I reviewed the following data within the [facility]  Nursing notes reviewed and incorporated, Labs reviewed, notes from prior ED visits and [place] Controlled Substance Database   ____________________________________________   FINAL CLINICAL IMPRESSION(S) / ED DIAGNOSES  Final diagnoses:  Crohn's colitis, with rectal bleeding ([place])      NEW MEDICATIONS STARTED DURING THIS VISIT:  New Prescriptions   No medications on file     Note:  This document was prepared using Dragon voice recognition software and may include unintentional dictation errors.    Merlyn Lot, MD 11/29/18 1152

## 2018-11-29 NOTE — H&P (Signed)
[facility] [place] NAME: Leroy [facility]    MR#:  [id number]  DATE OF BIRTH:  11-Jun-1985  DATE OF ADMISSION:  11/29/2018  PRIMARY CARE PHYSICIAN: [name], MD   REQUESTING/REFERRING PHYSICIAN: Dr. Merlyn Lot  CHIEF COMPLAINT:   Chief Complaint  Patient presents with  . Abdominal Pain    HISTORY OF PRESENT ILLNESS:  Leroy [facility]  is a 33 y.o. male with a known history of Crohn's disease, previous history of perirectal abscess and rectal fistula, asthma, anxiety/depression, history of C. difficile colitis who presents to the hospital complaining of abdominal pain.  Patient says he has been in his usual state of health and normally has minimal rectal bleeding with most of his stools but over the past few days to a week it has gotten somewhat worse.  He does have a rectal fistula and its causing him significant pain over the past week or so.  He saw his gastroenterologist who recommended inpatient admission and to be started on some IV antibiotics and oral prednisone and to have a colonoscopy done.  Hospitalist services were therefore contacted for admission.  Patient admits to some intermittent nausea but no vomiting.  He denies any fevers chills cough recent travel history or sick contacts.  Patient's COVID-19 test is still pending.  PAST MEDICAL HISTORY:   Past Medical History:  Diagnosis Date  . Anxiety   . Asthma   . Blood in stool   . Calculus of gallbladder without cholecystitis without obstruction 12/25/2015  . Crohn's disease ([place])   . Depression   . Exacerbation of Crohn's disease ([place]) 09/10/2017  . Gallbladder polyp 12/25/2015  . Gastroenteritis due to norovirus 05/03/2017  . Generalized abdominal pain   . H/O Clostridium difficile infection 05/03/2017  . Iron deficiency anemia 12/28/2016  . Perirectal abscess 07/04/2015   Overview:  Added automatically from request for surgery [id number]  . Rectal fistula   .  Sepsis ([place]) 06/22/2016    PAST SURGICAL HISTORY:   Past Surgical History:  Procedure Laterality Date  . COLON RESECTION    . COLON SURGERY    . COLONOSCOPY WITH PROPOFOL N/A 04/03/2015   Procedure: COLONOSCOPY WITH PROPOFOL;  Surgeon: Hulen Luster, MD;  Location: [facility] ENDOSCOPY;  Service: Endoscopy;  Laterality: N/A;  . COLONOSCOPY WITH PROPOFOL N/A 12/22/2015   Procedure: COLONOSCOPY WITH PROPOFOL;  Surgeon: [name], MD;  Location: [place];  Service: Endoscopy;  Laterality: N/A;  . COLONOSCOPY WITH PROPOFOL N/A 12/22/2016   Procedure: COLONOSCOPY WITH PROPOFOL;  Surgeon: Lin Landsman, MD;  Location: [place];  Service: Endoscopy;  Laterality: N/A;  . COLONOSCOPY WITH PROPOFOL N/A 09/14/2017   Procedure: COLONOSCOPY WITH PROPOFOL;  Surgeon: Lin Landsman, MD;  Location[facility] ENDOSCOPY;  Service: Gastroenterology;  Laterality: N/A;  . ESOPHAGOGASTRODUODENOSCOPY (EGD) WITH PROPOFOL N/A 12/22/2016   Procedure: ESOPHAGOGASTRODUODENOSCOPY (EGD) WITH PROPOFOL;  Surgeon: Lin Landsman, MD;  Location: [place];  Service: Endoscopy;  Laterality: N/A;  . rectal fistula surgery    . WRIST SURGERY      SOCIAL HISTORY:   Social History   Tobacco Use  . Smoking status: Never Smoker  . Smokeless tobacco: Former Network engineer Use Topics  . Alcohol use: Yes    Frequency: Never    FAMILY HISTORY:   Family History  Problem Relation Age of Onset  . Diabetes Paternal Grandfather   . Heart disease Paternal Grandfather   .  Depression Mother   . Drug abuse Mother   . Osteoporosis Neg Hx     DRUG ALLERGIES:  No Known Allergies  REVIEW OF SYSTEMS:   Review of Systems  Constitutional: Negative for chills, fever and weight loss.  HENT: Negative for congestion, nosebleeds and tinnitus.   Eyes: Negative for blurred vision, double vision and redness.  Respiratory: Negative for cough, hemoptysis, shortness of breath and wheezing.    Cardiovascular: Negative for chest pain, orthopnea, leg swelling and PND.  Gastrointestinal: Positive for abdominal pain, blood in stool and nausea. Negative for diarrhea, melena and vomiting.  Genitourinary: Negative for dysuria, hematuria and urgency.  Musculoskeletal: Negative for falls and joint pain.  Neurological: Negative for dizziness, tingling, sensory change, focal weakness, seizures, weakness and headaches.  Endo/Heme/Allergies: Negative for polydipsia. Does not bruise/bleed easily.  Psychiatric/Behavioral: Negative for depression and memory loss. The patient is not nervous/anxious.   All other systems reviewed and are negative.   MEDICATIONS AT HOME:   Prior to Admission medications   Medication Sig Start Date End Date Taking? Authorizing Provider  busPIRone (BUSPAR) 5 MG tablet Take 1 tablet (5 mg total) by mouth 2 (two) times daily. Patient taking differently: Take 5 mg by mouth daily.  02/20/18  Yes Eappen, Ria Clock, MD  escitalopram (LEXAPRO) 10 MG tablet Take 1 tablet (10 mg total) by mouth daily. 02/20/18  Yes Ursula Alert, MD  hydrOXYzine (VISTARIL) 25 MG capsule Take 1 capsule (25 mg total) by mouth daily as needed (severe anxiety attacks). 07/15/17  Yes Eappen, Ria Clock, MD  ARIPiprazole (ABILIFY) 5 MG tablet TAKE 1 TABLET BY MOUTH EVERYDAY AT BEDTIME Patient not taking: Reported on 11/14/2018 07/28/18   Ursula Alert, MD  inFLIXimab (REMICADE) 100 MG injection Inject 100 mg into the vein every 8 (eight) weeks.    [provider]  methotrexate (RHEUMATREX) 2.5 MG tablet TAKE 10 TABLETS BY MOUTH ONCE WEEKLY ALONG WITH DAILY FOLATE Patient not taking: Take 10 tablets by mouth once weekly along with daily folate 02/02/18   Vanga, Tally Due, MD      VITAL SIGNS:  Blood pressure 121/75, pulse 84, temperature 98.4 F (36.9 C), temperature source Oral, resp. rate 16, height 5' 5"  (1.651 m), weight 68 kg, SpO2 99 %.  PHYSICAL EXAMINATION:  Physical Exam   GENERAL:  33 y.o.-year-old patient lying in the bed in no acute distress.  EYES: Pupils equal, round, reactive to light and accommodation. No scleral icterus. Extraocular muscles intact.  HEENT: Head atraumatic, normocephalic. Oropharynx and nasopharynx clear. No oropharyngeal erythema, moist oral mucosa  NECK:  Supple, no jugular venous distention. No thyroid enlargement, no tenderness.  LUNGS: Normal breath sounds bilaterally, no wheezing, rales, rhonchi. No use of accessory muscles of respiration.  CARDIOVASCULAR: S1, S2 RRR. No murmurs, rubs, gallops, clicks.  ABDOMEN: Soft, nontender, nondistended. Bowel sounds present. No organomegaly or mass.  EXTREMITIES: No pedal edema, cyanosis, or clubbing. + 2 pedal & radial pulses b/l.   NEUROLOGIC: Cranial nerves II through XII are intact. No focal Motor or sensory deficits appreciated b/l PSYCHIATRIC: The patient is alert and oriented x 3. Good affect.  SKIN: No obvious rash, lesion, or ulcer.   LABORATORY PANEL:   CBC Recent Labs  Lab 11/29/18 1016  WBC 9.5  HGB 13.5  HCT 40.8  PLT 280   ------------------------------------------------------------------------------------------------------------------  Chemistries  Recent Labs  Lab 11/29/18 1016  NA 140  K 3.0*  CL 102  CO2 26  GLUCOSE 95  BUN 7  CREATININE  0.97  CALCIUM 8.5*  AST 16  ALT 15  ALKPHOS 81  BILITOT 1.0   ------------------------------------------------------------------------------------------------------------------  Cardiac Enzymes No results for input(s): TROPONINI in the last 168 hours. ------------------------------------------------------------------------------------------------------------------  RADIOLOGY:  No results found.   IMPRESSION AND PLAN:   33 y.o. male with a known history of Crohn's disease, previous history of perirectal abscess and rectal fistula, asthma, anxiety/depression, history of C. difficile colitis who presents to the  hospital complaining of abdominal pain.  1.  Abdominal pain/Crohn's disease flareup-patient presents to the hospital complaining of abdominal pain and intermittent nausea and also mucoid bloody stools. -Patient is also complaining of some rectal pain.  Seen by gastroenterology and they think this is patient's Crohn's flareup. -Patient is admitted to the hospital started patient on IV antibiotics with ciprofloxacin and Flagyl.  Start the patient on oral prednisone, gastroenterology plans on doing a colonoscopy tomorrow. -Continue IV fluids and further supportive care. -Placed on clear liquid diet.  2.  Hypokalemia-will replace and repeat in a.m. -Check magnesium level.  3.  History of Crohn's disease-patient is followed by Dr. [name]. -Patient is on Remicade infusions.  Currently being admitted for a flareup as mentioned above.  4.  Depression-continue Lexapro.    All the records are reviewed and case discussed with ED provider. Management plans discussed with the patient, family and they are in agreement.  CODE STATUS: Full code  TOTAL TIME TAKING CARE OF THIS PATIENT: 40 minutes.    [name] M.D on 11/29/2018 at 12:53 PM  Between 7am to 6pm - Pager - [phone]  After 6pm go to [email or web address] - password [email or web address] [facility]  [facility]  Office  [phone]  CC: Primary care physician; [name], MD

## 2018-11-29 NOTE — ED Triage Notes (Signed)
Pt c/o abd pain with bloody diarrhea, BM 10-15x per day. States he has hx of crohn's disease and was seen by doctor and referred to the ED for further treatment. Pt is ambulatory to triage without difficulty or SOB.

## 2018-11-29 NOTE — ED Notes (Signed)
Type and screen sent to lab at this time.

## 2018-11-29 NOTE — ED Notes (Signed)
Admitting MD at bedside.

## 2018-11-29 NOTE — ED Notes (Signed)
Pt provided ginger ale.

## 2018-11-29 NOTE — Consult Note (Signed)
[name], MD [address]  [place]  [place], [place] [address]  Main: [phone]  Fax: [phone] Pager: [phone]   Consultation  Referring Provider:     No ref. provider found Primary Care Physician:  [name], MD Primary Gastroenterologist:  Dr. Sherri Sear        Reason for Consultation:     Exacerbation of Crohn's disease  Date of Admission:  11/29/2018 Date of Consultation:  11/29/2018         HPI:   Leroy Hernandez is a 33 y.o. male with ileocolonic and perianal Crohn's, currently maintained on Remicade 10 mg/KG body weight well-known to me presented to ER with 2 weeks of worsening diarrhea with rectal bleeding and rectal pain.  Patient originally was seen by me on 11/14/2018 after 1 year with recurrence of diarrhea.  I perform stool studies which were negative for C. difficile.  However, his fecal calprotectin levels came back significantly elevated.  Patient discontinued methotrexate few weeks ago as he ran out of the medication.  Over last 2 weeks, his diarrhea has progressively worsened, having bowel movements up to 10 times a day, severe rectal pain and discomfort, he is concerned more about flareup of perianal fistula.  Denies subjective fevers, nausea or vomiting.  He does report mild diffuse abdominal pain.  Remicade levels and antibodies are pending In the ER, he was hemodynamically stable, potassium was low, no leukocytosis.  Stool for C. difficile came back negative.  SARS COVID-19 test is pending The ER physician, Dr. Quentin Cornwall contacted me about the patient and given severe diarrhea with rectal bleeding, we made a decision to admit him to expedite further evaluation Originally he was scheduled to undergo colonoscopy for disease activity assessment to Remicade on 12/29/2018  NSAIDs: None  Antiplts/Anticoagulants/Anti thrombotics: None  GI Procedures: Refer to my clinic notes  Past Medical History:  Diagnosis Date  . Anxiety    . Asthma   . Blood in stool   . Calculus of gallbladder without cholecystitis without obstruction 12/25/2015  . Crohn's disease ([place])   . Depression   . Exacerbation of Crohn's disease ([place]) 09/10/2017  . Gallbladder polyp 12/25/2015  . Gastroenteritis due to norovirus 05/03/2017  . Generalized abdominal pain   . H/O Clostridium difficile infection 05/03/2017  . Iron deficiency anemia 12/28/2016  . Perirectal abscess 07/04/2015   Overview:  Added automatically from request for surgery [phone]  . Rectal fistula   . Sepsis ([place]) 06/22/2016    Past Surgical History:  Procedure Laterality Date  . COLON RESECTION    . COLON SURGERY    . COLONOSCOPY WITH PROPOFOL N/A 04/03/2015   Procedure: COLONOSCOPY WITH PROPOFOL;  Surgeon: Hulen Luster, MD;  Location: [facility] ENDOSCOPY;  Service: Endoscopy;  Laterality: N/A;  . COLONOSCOPY WITH PROPOFOL N/A 12/22/2015   Procedure: COLONOSCOPY WITH PROPOFOL;  Surgeon: [name], MD;  Location: [place];  Service: Endoscopy;  Laterality: N/A;  . COLONOSCOPY WITH PROPOFOL N/A 12/22/2016   Procedure: COLONOSCOPY WITH PROPOFOL;  Surgeon: Lin Landsman, MD;  Location: [place];  Service: Endoscopy;  Laterality: N/A;  . COLONOSCOPY WITH PROPOFOL N/A 09/14/2017   Procedure: COLONOSCOPY WITH PROPOFOL;  Surgeon: Lin Landsman, MD;  Location: [facility] ENDOSCOPY;  Service: Gastroenterology;  Laterality: N/A;  . ESOPHAGOGASTRODUODENOSCOPY (EGD) WITH PROPOFOL N/A 12/22/2016   Procedure: ESOPHAGOGASTRODUODENOSCOPY (EGD) WITH PROPOFOL;  Surgeon: Lin Landsman, MD;  Location: [place];  Service: Endoscopy;  Laterality:  N/A;  . rectal fistula surgery    . WRIST SURGERY      Prior to Admission medications   Medication Sig Start Date End Date Taking? Authorizing Provider  busPIRone (BUSPAR) 5 MG tablet Take 1 tablet (5 mg total) by mouth 2 (two) times daily. Patient taking differently: Take 5 mg by mouth daily.  02/20/18  Yes Eappen,  Ria Clock, MD  escitalopram (LEXAPRO) 10 MG tablet Take 1 tablet (10 mg total) by mouth daily. 02/20/18  Yes Ursula Alert, MD  hydrOXYzine (VISTARIL) 25 MG capsule Take 1 capsule (25 mg total) by mouth daily as needed (severe anxiety attacks). 07/15/17  Yes Eappen, Ria Clock, MD  ARIPiprazole (ABILIFY) 5 MG tablet TAKE 1 TABLET BY MOUTH EVERYDAY AT BEDTIME Patient not taking: Reported on 11/14/2018 07/28/18   Ursula Alert, MD  inFLIXimab (REMICADE) 100 MG injection Inject 100 mg into the vein every 8 (eight) weeks.    [provider]  methotrexate (RHEUMATREX) 2.5 MG tablet TAKE 10 TABLETS BY MOUTH ONCE WEEKLY ALONG WITH DAILY FOLATE Patient not taking: Take 10 tablets by mouth once weekly along with daily folate 02/02/18   Tameia Rafferty, Tally Due, MD    Current Facility-Administered Medications:  .  ciprofloxacin (CIPRO) IVPB 400 mg, 400 mg, Intravenous, Q12H, Derius Ghosh, Tally Due, MD, Stopped at 11/29/18 1359 .  enoxaparin (LOVENOX) injection 40 mg, 40 mg, Subcutaneous, Q24H, Falcon Mccaskey, Tally Due, MD .  metroNIDAZOLE (FLAGYL) IVPB 500 mg, 500 mg, Intravenous, Q8H, Varian Innes, Tally Due, MD .  morphine 4 MG/ML injection 4 mg, 4 mg, Intravenous, Q3H PRN, Merlyn Lot, MD, 4 mg at 11/29/18 1125 .  potassium chloride SA (KLOR-CON) CR tablet 40 mEq, 40 mEq, Oral, Once, Sainani, Belia Heman, MD  Family History  Problem Relation Age of Onset  . Diabetes Paternal Grandfather   . Heart disease Paternal Grandfather   . Depression Mother   . Drug abuse Mother   . Osteoporosis Neg Hx      Social History   Tobacco Use  . Smoking status: Never Smoker  . Smokeless tobacco: Former Network engineer Use Topics  . Alcohol use: Yes    Frequency: Never  . Drug use: No    Comment: hx of 3 year ago     Allergies as of 11/29/2018  . (No Known Allergies)    Review of Systems:    All systems reviewed and negative except where noted in HPI.   Physical Exam:  Vital signs in last 24 hours: Temp:   [98.1 F (36.7 C)-98.4 F (36.9 C)] 98.1 F (36.7 C) (10/21 1410) Pulse Rate:  [75-92] 75 (10/21 1410) Resp:  [16-18] 17 (10/21 1410) BP: (104-131)/(68-80) 104/72 (10/21 1410) SpO2:  [98 %-100 %] 99 % (10/21 1410) Weight:  [68 kg] 68 kg (10/21 0957)   General:   Pleasant, cooperative in NAD Head:  Normocephalic and atraumatic. Eyes:   No icterus.   Conjunctiva pink. PERRLA. Ears:  Normal auditory acuity. Neck:  Supple; no masses or thyroidomegaly Lungs: Respirations even and unlabored. Lungs clear to auscultation bilaterally.   No wheezes, crackles, or rhonchi.  Heart:  Regular rate and rhythm;  Without murmur, clicks, rubs or gallops Abdomen:  Soft, nondistended, nontender. Normal bowel sounds. No appreciable masses or hepatomegaly.  No rebound or guarding.  Rectal:  Not performed.  There is no presence of localized perianal abscess Msk:  Symmetrical without gross deformities.  Strength normal Extremities:  Without edema, cyanosis or clubbing. Neurologic:  Alert and oriented x3;  grossly normal neurologically. Skin:  Intact without significant lesions or rashes. Psych:  Alert and cooperative. Normal affect.  LAB RESULTS: CBC Latest Ref Rng & Units 11/29/2018 11/07/2018 11/29/2017  WBC 4.0 - 10.5 K/uL 9.5 9.4 7.6  Hemoglobin 13.0 - 17.0 g/dL 13.5 14.6 15.4  Hematocrit 39.0 - 52.0 % 40.8 43.2 46.4  Platelets 150 - 400 K/uL 280 298 266    BMET BMP Latest Ref Rng & Units 11/29/2018 11/07/2018 03/16/2018  Glucose 70 - 99 mg/dL 95 105(H) -  BUN 6 - 20 mg/dL 7 9 -  Creatinine 0.61 - 1.24 mg/dL 0.97 0.98 -  Sodium 135 - 145 mmol/L 140 137 -  Potassium 3.5 - 5.1 mmol/L 3.0(L) 3.6 -  Chloride 98 - 111 mmol/L 102 107 -  CO2 22 - 32 mmol/L 26 22 -  Calcium 8.9 - 10.3 mg/dL 8.5(L) 9.0 9.0    LFT Hepatic Function Latest Ref Rng & Units 11/29/2018 11/29/2017 09/10/2017  Total Protein 6.5 - 8.1 g/dL 7.2 7.0 7.8  Albumin 3.5 - 5.0 g/dL 3.6 4.5 3.7  AST 15 - 41 U/L 16 40 16  ALT 0 - 44 U/L  15 29 16   Alk Phosphatase 38 - 126 U/L 81 88 76  Total Bilirubin 0.3 - 1.2 mg/dL 1.0 0.7 2.0(H)  Bilirubin, Direct 0.00 - 0.40 mg/dL - 0.16 -     STUDIES: No results found.    Impression / Plan:   Leroy Hernandez is a 33 y.o. male with history of ileocolonic Crohn's status post resection, perianal Crohn's, with postop recurrence of ileocolonic Crohn's currently maintained on high-dose Remicade, stopped methotrexate few weeks ago admitted with Crohn's exacerbation, manifesting as severe diarrhea, rectal bleeding as well as rectal pain/discomfort  Stool for C. difficile came back negative x2 Recommend to start prednisone 40 mg daily Recommend IV Cipro and Flagyl for perianal fistula Will defer abdominal imaging for now Clear liquid diet Recommend pharmacologic DVT prophylaxis in setting of Crohn's exacerbation as patient is at high risk Plan for colonoscopy tomorrow pending SARS COVID-19 results   Thank you for involving me in the care of this patient.      LOS: 0 days   Sherri Sear, MD  11/29/2018, 2:21 PM   Note: This dictation was prepared with Dragon dictation along with smaller phrase technology. Any transcriptional errors that result from this process are unintentional.

## 2018-11-30 ENCOUNTER — Inpatient Hospital Stay: Payer: BC Managed Care – PPO | Admitting: Anesthesiology

## 2018-11-30 ENCOUNTER — Encounter: Payer: Self-pay | Admitting: Anesthesiology

## 2018-11-30 ENCOUNTER — Encounter
Admission: EM | Disposition: A | Discharge status: Home / Self Care | Payer: Self-pay | Source: Home / Self Care | Attending: Specialist

## 2018-11-30 HISTORY — PX: COLONOSCOPY: SHX5424

## 2018-11-30 LAB — CBC
HCT: 43.8 % (ref 39.0–52.0)
Hemoglobin: 14.1 g/dL (ref 13.0–17.0)
MCH: 26.4 pg (ref 26.0–34.0)
MCHC: 32.2 g/dL (ref 30.0–36.0)
MCV: 82 fL (ref 80.0–100.0)
Platelets: 308 10*3/uL (ref 150–400)
RBC: 5.34 MIL/uL (ref 4.22–5.81)
RDW: 12.8 % (ref 11.5–15.5)
WBC: 7.6 10*3/uL (ref 4.0–10.5)
nRBC: 0 % (ref 0.0–0.2)

## 2018-11-30 LAB — HEPATIC FUNCTION PANEL
ALT: 14 U/L (ref 0–44)
AST: 15 U/L (ref 15–41)
Albumin: 3.2 g/dL — ABNORMAL LOW (ref 3.5–5.0)
Alkaline Phosphatase: 74 U/L (ref 38–126)
Bilirubin, Direct: 0.2 mg/dL (ref 0.0–0.2)
Indirect Bilirubin: 0.8 mg/dL (ref 0.3–0.9)
Total Bilirubin: 1 mg/dL (ref 0.3–1.2)
Total Protein: 6.8 g/dL (ref 6.5–8.1)

## 2018-11-30 LAB — POTASSIUM: Potassium: 4 mmol/L (ref 3.5–5.1)

## 2018-11-30 LAB — INFLIXIMAB (IFX) CONC+ IFX AB
Anti-Infliximab Antibody: <22 ng/mL
Infliximab Drug Level: 22 ug/mL

## 2018-11-30 LAB — HIV ANTIBODY (ROUTINE TESTING W REFLEX): HIV Screen 4th Generation wRfx: NONREACTIVE

## 2018-11-30 SURGERY — COLONOSCOPY
Anesthesia: General

## 2018-11-30 MED ORDER — MIDAZOLAM HCL 2 MG/2ML IJ SOLN
INTRAMUSCULAR | Status: DC | PRN
  Administered 2018-11-30: 2 mg via INTRAVENOUS

## 2018-11-30 MED ORDER — MIDAZOLAM HCL 2 MG/2ML IJ SOLN
INTRAMUSCULAR | Status: AC
  Filled 2018-11-30: qty 2

## 2018-11-30 MED ORDER — FENTANYL CITRATE (PF) 100 MCG/2ML IJ SOLN
INTRAMUSCULAR | Status: DC | PRN
  Administered 2018-11-30 (×2): 50 ug via INTRAVENOUS

## 2018-11-30 MED ORDER — BOOST / RESOURCE BREEZE PO LIQD CUSTOM
1.0000 | Freq: Three times a day (TID) | ORAL | Status: DC
  Administered 2018-11-30 – 2018-12-01 (×3): 1 via ORAL

## 2018-11-30 MED ORDER — PROPOFOL 500 MG/50ML IV EMUL
INTRAVENOUS | Status: AC
  Filled 2018-11-30: qty 50

## 2018-11-30 MED ORDER — MESALAMINE 4 G RE ENEM
4.0000 g | ENEMA | Freq: Every day | RECTAL | Status: DC
  Administered 2018-11-30: 4 g via RECTAL
  Filled 2018-11-30 (×2): qty 60

## 2018-11-30 MED ORDER — FENTANYL CITRATE (PF) 100 MCG/2ML IJ SOLN
INTRAMUSCULAR | Status: AC
  Filled 2018-11-30: qty 2

## 2018-11-30 MED ORDER — PROPOFOL 500 MG/50ML IV EMUL
INTRAVENOUS | Status: DC | PRN
  Administered 2018-11-30: 100 ug/kg/min via INTRAVENOUS

## 2018-11-30 NOTE — Anesthesia Postprocedure Evaluation (Signed)
Anesthesia Post Note  Patient: Leroy Hernandez  Procedure(s) Performed: COLONOSCOPY (N/A )  Patient location during evaluation: Endoscopy Anesthesia Type: General Level of consciousness: awake and alert Pain management: pain level controlled Vital Signs Assessment: post-procedure vital signs reviewed and stable Respiratory status: spontaneous breathing, nonlabored ventilation, respiratory function stable and patient connected to nasal cannula oxygen Cardiovascular status: blood pressure returned to baseline and stable Postop Assessment: no apparent nausea or vomiting Anesthetic complications: no     Last Vitals:  Vitals:   11/30/18 1312 11/30/18 1320  BP: 105/81 108/80  Pulse: 76 72  Resp: 16   Temp:    SpO2: 98% 96%    Last Pain:  Vitals:   11/30/18 1320  TempSrc:   PainSc: 0-No pain                 Chandlor Noecker S

## 2018-11-30 NOTE — Op Note (Signed)
[facility] Patient Name: Leroy Hernandez Procedure Date: 11/30/2018 12:12 PM MRN: [id number] Account #: [id number] Date of Birth: [birth year] Admit Type: Inpatient Age: 33 Room: [facility] ENDO ROOM 3 Gender: Male Note Status: Finalized Procedure:            Colonoscopy Indications:          Follow-up of Crohn's disease of the small bowel and                        colon, Disease activity assessment of Crohn's disease                        of the small bowel and colon, Assess therapeutic                        response to therapy of Crohn's disease of the small                        bowel and colon Providers:            Lin Landsman MD, MD Referring MD:         [name]. Ouida Sills MD, MD (Referring MD) Medicines:            Monitored Anesthesia Care Complications:        No immediate complications. Estimated blood loss:                        Minimal. Procedure:            Pre-Anesthesia Assessment:                       - Prior to the procedure, a History and Physical was                        performed, and patient medications and allergies were                        reviewed. The patient is competent. The risks and                        benefits of the procedure and the sedation options and                        risks were discussed with the patient. All questions                        were answered and informed consent was obtained.                        Patient identification and proposed procedure were                        verified by the physician, the nurse, the                        anesthesiologist, the anesthetist and the technician in                        the pre-procedure area in the procedure room in the  endoscopy suite. Mental Status Examination: alert and                        oriented. Airway Examination: normal oropharyngeal                        airway and neck mobility. Respiratory  Examination:                        clear to auscultation. CV Examination: normal.                        Prophylactic Antibiotics: The patient does not require                        prophylactic antibiotics. Prior Anticoagulants: The                        patient has taken no previous anticoagulant or                        antiplatelet agents. ASA Grade Assessment: III - A                        patient with severe systemic disease. After reviewing                        the risks and benefits, the patient was deemed in                        satisfactory condition to undergo the procedure. The                        anesthesia plan was to use monitored anesthesia care                        (MAC). Immediately prior to administration of                        medications, the patient was re-assessed for adequacy                        to receive sedatives. The heart rate, respiratory rate,                        oxygen saturations, blood pressure, adequacy of                        pulmonary ventilation, and response to care were                        monitored throughout the procedure. The physical status                        of the patient was re-assessed after the procedure.                       After obtaining informed consent, the colonoscope was                        passed under  direct vision. Throughout the procedure,                        the patient's blood pressure, pulse, and oxygen                        saturations were monitored continuously. The                        Colonoscope was introduced through the anus and                        advanced to the the ileocolonic anastomosis. The                        colonoscopy was performed without difficulty. The                        patient tolerated the procedure fairly well. The                        quality of the bowel preparation was fair. Findings:      There was evidence of a prior end-to-side ileo-colonic  anastomosis at 70       cm proximal to the anus. This was patent and was characterized by       inflammation. The anastomosis was traversed.      Inflammation characterized by congestion (edema), erythema, friability       and shallow ulcerations was found. This was graded as Rutgeerts Score i2       (more than five aphthous lesions with normal intervening mucosa or skip       areas of larger lesions or lesions confined to the ileocolonic       anastomosis), and when compared to previous examinations, the findings       are unchanged. Biopsies were taken with a cold forceps for histology.      Inflammation characterized by friability and shallow ulcerations was       found as patches surrounded by normal mucosa in the rectum, in the       recto-sigmoid colon, in the sigmoid colon and in the ascending colon.       This was mild in severity in ascending colon, most severe in rectum >>       rectosigmoid, and when compared to previous examinations, the findings       are worsened in the rectum. Biopsies were taken with a cold forceps for       histology.      The perianal exam findings include perianal fistula and a skin tag. Impression:           - Preparation of the colon was fair.                       - Patent end-to-side ileo-colonic anastomosis,                        characterized by inflammation.                       - Crohn's disease, with ileitis and colitis.  Inflammation was found. This was graded as Rutgeerts                        Score i2 (more than five aphthous lesions or skip areas                        of larger lesions or lesions confined to the                        ileocolonic anastomosis), unchanged compared to                        previous examinations. Biopsied.                       - Crohn's disease with colonic involvement.                        Inflammation was found in the rectum, in the                        recto-sigmoid colon, in  the sigmoid colon and in the                        ascending colon. This was mild in severity, worsened                        compared to previous examinations. Biopsied, to rule                        out CMV, HSV on rectal ulcers                       - Perianal fistula and perianal skin tag found on                        perianal exam. Recommendation:       - Return patient to hospital ward for ongoing care.                       - Resume regular diet today.                       - Continue present medications.                       - Await pathology results.                       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 All rights reserved. The codes documented in this report are preliminary and upon coder review may  be revised to meet current compliance requirements. Dr. Ulyess Mort Lin Landsman MD, MD 11/30/2018 12:58:49 PM This report has been signed electronically. Number of Addenda: 0 Note Initiated On: 11/30/2018 12:12 PM Scope Withdrawal Time: 0 hours 13 minutes 22 seconds  Total Procedure Duration: 0 hours 14 minutes 14 seconds  Estimated Blood Loss: Estimated blood loss was minimal. Estimated blood loss                        was minimal.      [facility]

## 2018-11-30 NOTE — Transfer of Care (Signed)
Immediate Anesthesia Transfer of Care Note  Patient: Leroy Hernandez  Procedure(s) Performed: COLONOSCOPY (N/A )  Patient Location: PACU  Anesthesia Type:General  Level of Consciousness: awake and sedated  Airway & Oxygen Therapy: Patient Spontanous Breathing and Patient connected to nasal cannula oxygen  Post-op Assessment: Report given to RN and Post -op Vital signs reviewed and stable  Post vital signs: Reviewed and stable  Last Vitals:  Vitals Value Taken Time  BP 101/57 11/30/18 1252  Temp 36.1 C 11/30/18 1252  Pulse 78 11/30/18 1253  Resp 6 11/30/18 1253  SpO2 98 % 11/30/18 1253  Vitals shown include unvalidated device data.  Last Pain:  Vitals:   11/30/18 1252  TempSrc: Tympanic  PainSc:          Complications: No apparent anesthesia complications

## 2018-11-30 NOTE — Anesthesia Preprocedure Evaluation (Addendum)
Anesthesia Evaluation  Patient identified by MRN, date of birth, ID band Patient awake    Reviewed: Allergy & Precautions, NPO status , Patient's Chart, lab work & pertinent test results, reviewed documented beta blocker date and time   Airway Mallampati: II  TM Distance: >3 FB     Dental  (+) Chipped   Pulmonary asthma ,           Cardiovascular      Neuro/Psych PSYCHIATRIC DISORDERS Anxiety Depression    GI/Hepatic   Endo/Other    Renal/GU      Musculoskeletal   Abdominal   Peds  Hematology  (+) anemia ,   Anesthesia Other Findings Crohn's.  Reproductive/Obstetrics                             Anesthesia Physical Anesthesia Plan  ASA: III  Anesthesia Plan: General   Post-op Pain Management:    Induction: Intravenous  PONV Risk Score and Plan:   Airway Management Planned:   Additional Equipment:   Intra-op Plan:   Post-operative Plan:   Informed Consent: I have reviewed the patients History and Physical, chart, labs and discussed the procedure including the risks, benefits and alternatives for the proposed anesthesia with the patient or authorized representative who has indicated his/her understanding and acceptance.       Plan Discussed with: CRNA  Anesthesia Plan Comments:         Anesthesia Quick Evaluation

## 2018-11-30 NOTE — Anesthesia Procedure Notes (Signed)
Performed by: Cook-Martin, Tawanna Funk Pre-anesthesia Checklist: Patient identified, Emergency Drugs available, Suction available, Patient being monitored and Timeout performed Patient Re-evaluated:Patient Re-evaluated prior to induction Oxygen Delivery Method: Nasal cannula Preoxygenation: Pre-oxygenation with 100% oxygen Induction Type: IV induction Placement Confirmation: positive ETCO2 and CO2 detector       

## 2018-11-30 NOTE — Anesthesia Post-op Follow-up Note (Signed)
Anesthesia QCDR form completed.        

## 2018-11-30 NOTE — Progress Notes (Signed)
[facility] [place] NAME: Leroy Hernandez    MR#:  [id number]  DATE OF BIRTH:  10-05-1985  SUBJECTIVE:   Pt. Is still complaining of some mild lower abdomen, rectal pain.  No bloody stools this a.m. and planning for colonoscopy this a.m.   REVIEW OF SYSTEMS:    Review of Systems  Constitutional: Negative for chills and fever.  HENT: Negative for congestion and tinnitus.   Eyes: Negative for blurred vision and double vision.  Respiratory: Negative for cough, shortness of breath and wheezing.   Cardiovascular: Negative for chest pain, orthopnea and PND.  Gastrointestinal: Negative for abdominal pain, diarrhea, nausea and vomiting.  Genitourinary: Negative for dysuria and hematuria.  Neurological: Negative for dizziness, sensory change and focal weakness.  All other systems reviewed and are negative.   Nutrition: NPO for colonoscopy Tolerating Diet: No Tolerating PT: Ambulatory     DRUG ALLERGIES:  No Known Allergies  VITALS:  Blood pressure 101/66, pulse (!) 55, temperature (!) 97.4 F (36.3 C), temperature source Oral, resp. rate 17, height 5' 5"  (1.651 m), weight 68 kg, SpO2 99 %.  PHYSICAL EXAMINATION:   Physical Exam  GENERAL:  33 y.o.-year-old patient lying in bed in no acute distress.  EYES: Pupils equal, round, reactive to light and accommodation. No scleral icterus. Extraocular muscles intact.  HEENT: Head atraumatic, normocephalic. Oropharynx and nasopharynx clear.  NECK:  Supple, no jugular venous distention. No thyroid enlargement, no tenderness.  LUNGS: Normal breath sounds bilaterally, no wheezing, rales, rhonchi. No use of accessory muscles of respiration.  CARDIOVASCULAR: S1, S2 normal. No murmurs, rubs, or gallops.  ABDOMEN: Soft, nontender, nondistended. Bowel sounds present. No organomegaly or mass.  EXTREMITIES: No cyanosis, clubbing or edema b/l.    NEUROLOGIC: Cranial nerves II through XII are intact. No  focal Motor or sensory deficits b/l.   PSYCHIATRIC: The patient is alert and oriented x 3.  SKIN: No obvious rash, lesion, or ulcer.    LABORATORY PANEL:   CBC Recent Labs  Lab 11/30/18 0336  WBC 7.6  HGB 14.1  HCT 43.8  PLT 308   ------------------------------------------------------------------------------------------------------------------  Chemistries  Recent Labs  Lab 11/29/18 1016  NA 140  K 3.0*  CL 102  CO2 26  GLUCOSE 95  BUN 7  CREATININE 0.97  CALCIUM 8.5*  MG 2.0  AST 16  ALT 15  ALKPHOS 81  BILITOT 1.0   ------------------------------------------------------------------------------------------------------------------  Cardiac Enzymes No results for input(s): TROPONINI in the last 168 hours. ------------------------------------------------------------------------------------------------------------------  RADIOLOGY:  No results found.   ASSESSMENT AND PLAN:   33 y.o. male with a known history of Crohn's disease, previous history of perirectal abscess and rectal fistula, asthma, anxiety/depression, history of C. difficile colitis who presents to the hospital complaining of abdominal pain.  1.  Abdominal pain/Crohn's disease flareup-patient presents to the hospital complaining of abdominal pain and intermittent nausea and also mucoid bloody stools. -Patient was also complaining of some rectal pain.  Seen by gastroenterology and they think this is patient's Crohn's flareup. - cont. Oral prednisone, IV Cipro, flagyl.  - seen by GI and plan for colonoscopy later today. Further plan pending colonoscopy.  2.  Hypokalemia- replaced and will repeat this a.m.  - Mg. Level was normal.   3.  History of Crohn's disease-patient is followed by Dr. [name]. -Patient is on Remicade infusions.  cont. Treatment as mentioned above.   4.  Depression-continue Lexapro.     All the  records are reviewed and case discussed with Care Management/Social Worker.  Management plans discussed with the patient, family and they are in agreement.  CODE STATUS: Full code  DVT Prophylaxis: Ted's & SCD's.   TOTAL TIME TAKING CARE OF THIS PATIENT: 30 minutes.   POSSIBLE D/C IN 1-2 DAYS, DEPENDING ON CLINICAL CONDITION.   [name] M.D on 11/30/2018 at 11:20 AM  Between 7am to 6pm - Pager - [phone]  After 6pm go to [email or web address] - [employer] [facility]  Office  [phone]  CC: Primary care physician; [name], MD

## 2018-12-01 ENCOUNTER — Other Ambulatory Visit: Payer: Self-pay | Admitting: Gastroenterology

## 2018-12-01 DIAGNOSIS — K50111 Crohn's disease of large intestine with rectal bleeding: Secondary | ICD-10-CM

## 2018-12-01 DIAGNOSIS — K5080 Crohn's disease of both small and large intestine without complications: Secondary | ICD-10-CM

## 2018-12-01 MED ORDER — METHOTREXATE 2.5 MG PO TABS: 25.0000 mg | ORAL_TABLET | ORAL | 0 refills | Status: DC

## 2018-12-01 MED ORDER — MESALAMINE 4 G RE ENEM: 4.0000 g | ENEMA | Freq: Every day | RECTAL | 0 refills | Status: DC

## 2018-12-01 MED ORDER — METRONIDAZOLE 500 MG PO TABS: 500.0000 mg | ORAL_TABLET | Freq: Two times a day (BID) | ORAL | 0 refills | Status: AC

## 2018-12-01 MED ORDER — FOLIC ACID 1 MG PO TABS: 1.0000 mg | ORAL_TABLET | Freq: Every day | ORAL | 3 refills | Status: AC

## 2018-12-01 MED ORDER — CIPROFLOXACIN HCL 500 MG PO TABS: 500.0000 mg | ORAL_TABLET | Freq: Two times a day (BID) | ORAL | 0 refills | Status: AC

## 2018-12-01 MED ORDER — HYDROCODONE-ACETAMINOPHEN 5-325 MG PO TABS: 1.0000 | ORAL_TABLET | Freq: Four times a day (QID) | ORAL | 0 refills | Status: DC | PRN

## 2018-12-01 MED ORDER — PREDNISONE 10 MG PO TABS: ORAL_TABLET | ORAL | 0 refills | Status: DC

## 2018-12-01 NOTE — Discharge Summary (Signed)
[facility] [place] NAME: Leroy Hernandez    MR#:  [id number]  DATE OF BIRTH:  1985/11/19  DATE OF ADMISSION:  11/29/2018 ADMITTING PHYSICIAN: [name], MD  DATE OF DISCHARGE: 12/01/2018  PRIMARY CARE PHYSICIAN: [name], MD    ADMISSION DIAGNOSIS:  Crohn's colitis, with rectal bleeding ([place]) [K50.111]  DISCHARGE DIAGNOSIS:  Active Problems:   Crohn's disease of anal canal ([place])   SECONDARY DIAGNOSIS:   Past Medical History:  Diagnosis Date  . Anxiety   . Asthma    as a child  . Blood in stool   . Calculus of gallbladder without cholecystitis without obstruction 12/25/2015  . Crohn's disease ([place])   . Depression   . Exacerbation of Crohn's disease ([place]) 09/10/2017  . Gallbladder polyp 12/25/2015  . Gastroenteritis due to norovirus 05/03/2017  . Generalized abdominal pain   . H/O Clostridium difficile infection 05/03/2017  . Iron deficiency anemia 12/28/2016  . Perirectal abscess 07/04/2015   Overview:  Added automatically from request for surgery [id number]  . Rectal fistula   . Sepsis ([place]) 06/22/2016    HOSPITAL COURSE:   33 y.o.malewith a known history of Crohn's disease, previous history of perirectal abscess and rectal fistula, asthma, anxiety/depression, history of C. difficile colitis who presents to the hospital complaining of abdominal pain.  1. Abdominal pain/Crohn's disease flareup-patient presented to the hospital complaining of abdominal pain and intermittent nausea and also mucoid bloody stools. -Patient was admitted to the hospital treated with oral steroids, IV antibiotics with Cipro and Flagyl.  A gastroenterology consult was obtained.  Patient underwent a colonoscopy day after admission which showed Crohn's disease with colonic involvement. Inflammation was found in the rectum, in the recto-sigmoid colon, in the sigmoid colon and in the ascending colon. This was mild in severity, worsened  compared to previous examinations.  Perianal fistula and perianal skin tag found on perianal exam. -Patient has clinically improved and now being discharged on oral prednisone with taper over the next 4 weeks, oral ciprofloxacin and Flagyl and also Rowasa suppositories.  Patient will follow up with gastroenterology with Dr. [name] in the next 2 weeks.   2. Hypokalemia-improved and resolved with supplementation.  3. History of Crohn's disease-she needs follow-up with GI as an outpatient.  Patient is on Remicade infusions as an outpatient and also methotrexate.  4. Depression-continue Lexapro  DISCHARGE CONDITIONS:   Stable.   CONSULTS OBTAINED:  Treatment Team:  Lin Landsman, MD  DRUG ALLERGIES:  No Known Allergies  DISCHARGE MEDICATIONS:   Allergies as of 12/01/2018   No Known Allergies     Medication List    STOP taking these medications   ARIPiprazole 5 MG tablet Commonly known as: ABILIFY   busPIRone 5 MG tablet Commonly known as: BUSPAR     TAKE these medications   ciprofloxacin 500 MG tablet Commonly known as: Cipro Take 1 tablet (500 mg total) by mouth 2 (two) times daily for 14 days.   escitalopram 10 MG tablet Commonly known as: LEXAPRO Take 1 tablet (10 mg total) by mouth daily.   folic acid 1 MG tablet Commonly known as: FOLVITE Take 1 tablet (1 mg total) by mouth daily.   HYDROcodone-acetaminophen 5-325 MG tablet Commonly known as: Norco Take 1 tablet by mouth every 6 (six) hours as needed for moderate pain.   hydrOXYzine 25 MG capsule Commonly known as: Vistaril Take 1 capsule (25 mg total) by mouth daily as needed (severe  anxiety attacks).   mesalamine 4 g enema Commonly known as: ROWASA Place 60 mLs (4 g total) rectally at bedtime for 14 days.   methotrexate 2.5 MG tablet Commonly known as: RHEUMATREX Take 10 tablets (25 mg total) by mouth once a week. Caution:Chemotherapy. Protect from light. What changed: See the new  instructions.   metroNIDAZOLE 500 MG tablet Commonly known as: Flagyl Take 1 tablet (500 mg total) by mouth 2 (two) times daily for 14 days.   predniSONE 10 MG tablet Commonly known as: DELTASONE Take 40 mg PO Daily X 2 weeks, then 30 mg Daily X 1 week, then 20 mg Daily X 1 week, then 10 mg Daily X 1 week and then STOP.   Remicade 100 MG injection Generic drug: inFLIXimab Inject 100 mg into the vein every 8 (eight) weeks.         DISCHARGE INSTRUCTIONS:   DIET:  Regular diet  DISCHARGE CONDITION:  Stable  ACTIVITY:  Activity as tolerated  OXYGEN:  Home Oxygen: No.   Oxygen Delivery: room air  DISCHARGE LOCATION:  home   If you experience worsening of your admission symptoms, develop shortness of breath, life threatening emergency, suicidal or homicidal thoughts you must seek medical attention immediately by calling 911 or calling your MD immediately  if symptoms less severe.  You Must read complete instructions/literature along with all the possible adverse reactions/side effects for all the Medicines you take and that have been prescribed to you. Take any new Medicines after you have completely understood and accpet all the possible adverse reactions/side effects.   Please note  You were cared for by a hospitalist during your hospital stay. If you have any questions about your discharge medications or the care you received while you were in the hospital after you are discharged, you can call the unit and asked to speak with the hospitalist on call if the hospitalist that took care of you is not available. Once you are discharged, your primary care physician will handle any further medical issues. Please note that NO REFILLS for any discharge medications will be authorized once you are discharged, as it is imperative that you return to your primary care physician (or establish a relationship with a primary care physician if you do not have one) for your aftercare needs so  that they can reassess your need for medications and monitor your lab values.     Today   Rectal pain has improved, no nausea or vomiting.  Tolerating p.o. well.  Will discharge home today discussed with gastroenterology.  VITAL SIGNS:  Blood pressure 111/78, pulse (!) 56, temperature (!) 97.4 F (36.3 C), resp. rate 20, height 5' 5"  (1.651 m), weight 68 kg, SpO2 100 %.  I/O:    Intake/Output Summary (Last 24 hours) at 12/01/2018 1446 Last data filed at 12/01/2018 0902 Gross per 24 hour  Intake 1332.64 ml  Output 4 ml  Net 1328.64 ml    PHYSICAL EXAMINATION:  GENERAL:  33 y.o.-year-old patient lying in the bed with no acute distress.  EYES: Pupils equal, round, reactive to light and accommodation. No scleral icterus. Extraocular muscles intact.  HEENT: Head atraumatic, normocephalic. Oropharynx and nasopharynx clear.  NECK:  Supple, no jugular venous distention. No thyroid enlargement, no tenderness.  LUNGS: Normal breath sounds bilaterally, no wheezing, rales,rhonchi. No use of accessory muscles of respiration.  CARDIOVASCULAR: S1, S2 normal. No murmurs, rubs, or gallops.  ABDOMEN: Soft, non-tender, non-distended. Bowel sounds present. No organomegaly or mass.  EXTREMITIES:  No pedal edema, cyanosis, or clubbing.  NEUROLOGIC: Cranial nerves II through XII are intact. No focal motor or sensory defecits b/l.  PSYCHIATRIC: The patient is alert and oriented x 3.  SKIN: No obvious rash, lesion, or ulcer.   DATA REVIEW:   CBC Recent Labs  Lab 11/30/18 0336  WBC 7.6  HGB 14.1  HCT 43.8  PLT 308    Chemistries  Recent Labs  Lab 11/29/18 1016 11/30/18 0336 11/30/18 1127  NA 140  --   --   K 3.0*  --  4.0  CL 102  --   --   CO2 26  --   --   GLUCOSE 95  --   --   BUN 7  --   --   CREATININE 0.97  --   --   CALCIUM 8.5*  --   --   MG 2.0  --   --   AST 16 15  --   ALT 15 14  --   ALKPHOS 81 74  --   BILITOT 1.0 1.0  --     Cardiac Enzymes No results for  input(s): TROPONINI in the last 168 hours.  Microbiology Results  Results for orders placed or performed during the hospital encounter of 11/29/18  C difficile quick scan w PCR reflex     Status: None   Collection Time: 11/29/18 11:05 AM   Specimen: STOOL  Result Value Ref Range Status   C Diff antigen NEGATIVE NEGATIVE Final   C Diff toxin NEGATIVE NEGATIVE Final   C Diff interpretation No C. difficile detected.  Final    Comment: Performed at [facility], [place], [place] [address]  SARS CORONAVIRUS 2 (TAT 6-24 HRS) Nasopharyngeal Nasopharyngeal Swab     Status: None   Collection Time: 11/29/18 11:17 AM   Specimen: Nasopharyngeal Swab  Result Value Ref Range Status   SARS Coronavirus 2 NEGATIVE NEGATIVE Final    Comment: (NOTE) SARS-CoV-2 target nucleic acids are NOT DETECTED. The SARS-CoV-2 RNA is generally detectable in upper and lower respiratory specimens during the acute phase of infection. Negative results do not preclude SARS-CoV-2 infection, do not rule out co-infections with other pathogens, and should not be used as the sole basis for treatment or other patient management decisions. Negative results must be combined with clinical observations, patient history, and epidemiological information. The expected result is Negative. Fact Sheet for Patients: SugarRoll.be Fact Sheet for Healthcare Providers: https://www.woods-mathews.com/ This test is not yet approved or cleared by the [place] FDA and  has been authorized for detection and/or diagnosis of SARS-CoV-2 by FDA under an Emergency Use Authorization (EUA). This EUA will remain  in effect (meaning this test can be used) for the duration of the COVID-19 declaration under Section 56 4(b)(1) of the Act, 21 U.S.C. section 360bbb-3(b)(1), unless the authorization is terminated or revoked sooner. Performed at [facility], [place] [address],  [place],  [address]     RADIOLOGY:  No results found.    Management plans discussed with the patient, family and they are in agreement.  CODE STATUS:     Code Status Orders  (From admission, onward)         Start     Ordered   11/29/18 1448  Full code  Continuous     11/29/18 1447          TOTAL TIME TAKING CARE OF THIS PATIENT: 40 minutes.    Belia Heman  Walker Paddack M.D on 12/01/2018 at 2:46 PM  Between 7am to 6pm - Pager - [phone]  After 6pm go to [email or web address] - [employer] [facility]  Office  [phone]  CC: Primary care physician; [name], MD

## 2018-12-01 NOTE — Progress Notes (Signed)
Leroy Darby, MD [address]  [place]  [place], [place] [address]  Main: [phone]  Fax: [phone] Pager: [phone]   Subjective: Continues to have mild rectal pain. Reports BMs are soft, not watery anymore. Tolerating po well.   Objective: Vital signs in last 24 hours: Vitals:   11/30/18 1320 11/30/18 1509 11/30/18 2142 12/01/18 0422  BP: 108/80 102/70 98/60 111/78  Pulse: 72 64 62 (!) 56  Resp:  16 20 20   Temp:   (!) 97.5 F (36.4 C) (!) 97.4 F (36.3 C)  TempSrc:      SpO2: 96%  96% 100%  Weight:      Height:       Weight change:   Intake/Output Summary (Last 24 hours) at 12/01/2018 1419 Last data filed at 12/01/2018 0902 Gross per 24 hour  Intake 1332.64 ml  Output 4 ml  Net 1328.64 ml     Exam: Heart:: Regular rate and rhythm, S1S2 present or without murmur or extra heart sounds Lungs: normal and clear to auscultation Abdomen: soft, nontender, normal bowel sounds   Lab Results: CBC Latest Ref Rng & Units 11/30/2018 11/29/2018 11/07/2018  WBC 4.0 - 10.5 K/uL 7.6 9.5 9.4  Hemoglobin 13.0 - 17.0 g/dL 14.1 13.5 14.6  Hematocrit 39.0 - 52.0 % 43.8 40.8 43.2  Platelets 150 - 400 K/uL 308 280 298   CMP Latest Ref Rng & Units 11/30/2018 11/29/2018 11/07/2018  Glucose 70 - 99 mg/dL - 95 105(H)  BUN 6 - 20 mg/dL - 7 9  Creatinine 0.61 - 1.24 mg/dL - 0.97 0.98  Sodium 135 - 145 mmol/L - 140 137  Potassium 3.5 - 5.1 mmol/L 4.0 3.0(L) 3.6  Chloride 98 - 111 mmol/L - 102 107  CO2 22 - 32 mmol/L - 26 22  Calcium 8.9 - 10.3 mg/dL - 8.5(L) 9.0  Total Protein 6.5 - 8.1 g/dL 6.8 7.2 -  Total Bilirubin 0.3 - 1.2 mg/dL 1.0 1.0 -  Alkaline Phos 38 - 126 U/L 74 81 -  AST 15 - 41 U/L 15 16 -  ALT 0 - 44 U/L 14 15 -    Micro Results: Recent Results (from the past 240 hour(s))  C difficile quick scan w PCR reflex     Status: None   Collection Time: 11/29/18 11:05 AM   Specimen: STOOL  Result Value Ref Range Status   C Diff antigen NEGATIVE  NEGATIVE Final   C Diff toxin NEGATIVE NEGATIVE Final   C Diff interpretation No C. difficile detected.  Final    Comment: Performed at [facility], [place], [place] [address]  SARS CORONAVIRUS 2 (TAT 6-24 HRS) Nasopharyngeal Nasopharyngeal Swab     Status: None   Collection Time: 11/29/18 11:17 AM   Specimen: Nasopharyngeal Swab  Result Value Ref Range Status   SARS Coronavirus 2 NEGATIVE NEGATIVE Final    Comment: (NOTE) SARS-CoV-2 target nucleic acids are NOT DETECTED. The SARS-CoV-2 RNA is generally detectable in upper and lower respiratory specimens during the acute phase of infection. Negative results do not preclude SARS-CoV-2 infection, do not rule out co-infections with other pathogens, and should not be used as the sole basis for treatment or other patient management decisions. Negative results must be combined with clinical observations, patient history, and epidemiological information. The expected result is Negative. Fact Sheet for Patients: SugarRoll.be Fact Sheet for Healthcare Providers: https://www.woods-mathews.com/ This test is not yet approved or cleared by the [place] FDA  and  has been authorized for detection and/or diagnosis of SARS-CoV-2 by FDA under an Emergency Use Authorization (EUA). This EUA will remain  in effect (meaning this test can be used) for the duration of the COVID-19 declaration under Section 56 4(b)(1) of the Act, 21 U.S.C. section 360bbb-3(b)(1), unless the authorization is terminated or revoked sooner. Performed at [facility], [place] [address], [place], [place] [address]    Studies/Results: No results found. Medications:  I have reviewed the patient's current medications. Prior to Admission:  Medications Prior to Admission  Medication Sig Dispense Refill Last Dose  . busPIRone (BUSPAR) 5 MG tablet Take 1 tablet (5 mg total) by mouth 2 (two) times daily.  (Patient taking differently: Take 5 mg by mouth daily. ) 180 tablet 1 11/28/2018 at 2000  . escitalopram (LEXAPRO) 10 MG tablet Take 1 tablet (10 mg total) by mouth daily. 90 tablet 1 11/28/2018 at 2000  . hydrOXYzine (VISTARIL) 25 MG capsule Take 1 capsule (25 mg total) by mouth daily as needed (severe anxiety attacks). 90 capsule 1 11/28/2018 at 2000  . ARIPiprazole (ABILIFY) 5 MG tablet TAKE 1 TABLET BY MOUTH EVERYDAY AT BEDTIME (Patient not taking: Reported on 11/14/2018) 30 tablet 0   . inFLIXimab (REMICADE) 100 MG injection Inject 100 mg into the vein every 8 (eight) weeks.   +4 weeks  . [DISCONTINUED] methotrexate (RHEUMATREX) 2.5 MG tablet TAKE 10 TABLETS BY MOUTH ONCE WEEKLY ALONG WITH DAILY FOLATE (Patient not taking: Take 10 tablets by mouth once weekly along with daily folate) 120 tablet 1    Scheduled: . enoxaparin (LOVENOX) injection  40 mg Subcutaneous Q24H  . escitalopram  10 mg Oral Daily  . feeding supplement  1 Container Oral TID BM  . mesalamine  4 g Rectal QHS  . predniSONE  40 mg Oral Q breakfast   Continuous: . sodium chloride Stopped (12/01/18 0756)  . ciprofloxacin Stopped (12/01/18 0303)  . metronidazole Stopped (12/01/18 0531)   DHW:YSHUOH chloride, acetaminophen **OR** acetaminophen, morphine injection, ondansetron **OR** ondansetron (ZOFRAN) IV Anti-infectives (From admission, onward)   Start     Dose/Rate Route Frequency Ordered Stop   12/01/18 0000  ciprofloxacin (CIPRO) 500 MG tablet     500 mg Oral 2 times daily 12/01/18 1134 12/15/18 2359   12/01/18 0000  metroNIDAZOLE (FLAGYL) 500 MG tablet     500 mg Oral 2 times daily 12/01/18 1134 12/15/18 2359   11/29/18 1400  ciprofloxacin (CIPRO) IVPB 400 mg     400 mg 200 mL/hr over 60 Minutes Intravenous Every 12 hours 11/29/18 1236     11/29/18 1300  metroNIDAZOLE (FLAGYL) IVPB 500 mg     500 mg 100 mL/hr over 60 Minutes Intravenous Every 8 hours 11/29/18 1236       Scheduled Meds: . enoxaparin (LOVENOX)  injection  40 mg Subcutaneous Q24H  . escitalopram  10 mg Oral Daily  . feeding supplement  1 Container Oral TID BM  . mesalamine  4 g Rectal QHS  . predniSONE  40 mg Oral Q breakfast   Continuous Infusions: . sodium chloride Stopped (12/01/18 0756)  . ciprofloxacin Stopped (12/01/18 0303)  . metronidazole Stopped (12/01/18 0531)   PRN Meds:.sodium chloride, acetaminophen **OR** acetaminophen, morphine injection, ondansetron **OR** ondansetron (ZOFRAN) IV   Assessment: Active Problems:   Crohn's disease of anal canal (HCC)  Clinically improving Colonoscopy yesterday revealed active ileocolonic crohn's, worse in rectum, IHC stain for CMV, HSV pending  Plan: Continue prednisone 60m daily for  2 weeks followed by taper 59m weekly Continue PO cipro and flagyl 5098mBID for 2 weeks Continue Rowasa 4gm Q nightly for 2 weeks Restart MTX 2529meekly with oral folate 1mg58mily Continue remicade 10mg61mevery 8weeks, his anti-infliximab antibodies are at undetectable levels Clinic visit in 2-3 weeks  Ok to discharge home today   LOS: 2 days   Leroy Hernandez 12/01/2018, 2:19 PM

## 2018-12-01 NOTE — Progress Notes (Signed)
Leroy Hernandez was admitted to the Hospital on 11/29/2018 and Discharged  12/01/2018 and should be excused from work/school   for 3 days starting 11/29/2018 , may return to work/school without any restrictions.  Call Abel Presto MD, [facility]  [phone] with questions.  [name] M.D on 12/01/2018,at 12:26 PM

## 2018-12-01 NOTE — Progress Notes (Signed)
[place] to be D/C'd Home per MD order.  Discussed prescriptions and follow up appointments with the patient. Prescriptions given to patient, medication list explained in detail. Pt verbalized understanding.    Vitals:   11/30/18 2142 12/01/18 0422  BP: 98/60 111/78  Pulse: 62 (!) 56  Resp: 20 20  Temp: (!) 97.5 F (36.4 C) (!) 97.4 F (36.3 C)  SpO2: 96% 100%    Skin clean, dry and intact without evidence of skin break down, no evidence of skin tears noted. IV catheter discontinued intact. Site without signs and symptoms of complications. Dressing and pressure applied. Pt denies pain at this time. No complaints noted.  An After Visit Summary was printed and given to the patient. Patient escorted via [place], and D/C home via private auto.  Fuller Mandril, RN

## 2018-12-02 LAB — GI PATHOGEN PANEL BY PCR, STOOL

## 2018-12-04 LAB — SURGICAL PATHOLOGY

## 2018-12-06 NOTE — Telephone Encounter (Signed)
Message was left

## 2018-12-15 ENCOUNTER — Ambulatory Visit (INDEPENDENT_AMBULATORY_CARE_PROVIDER_SITE_OTHER): Payer: BC Managed Care – PPO | Admitting: Gastroenterology

## 2018-12-15 ENCOUNTER — Other Ambulatory Visit: Payer: Self-pay

## 2018-12-15 DIAGNOSIS — Z23 Encounter for immunization: Secondary | ICD-10-CM | POA: Diagnosis not present

## 2018-12-15 DIAGNOSIS — K50811 Crohn's disease of both small and large intestine with rectal bleeding: Secondary | ICD-10-CM

## 2018-12-18 ENCOUNTER — Ambulatory Visit: Payer: BC Managed Care – PPO

## 2018-12-26 ENCOUNTER — Other Ambulatory Visit: Payer: Self-pay

## 2018-12-26 ENCOUNTER — Encounter: Payer: Self-pay | Admitting: Gastroenterology

## 2018-12-26 ENCOUNTER — Ambulatory Visit: Payer: BC Managed Care – PPO | Admitting: Gastroenterology

## 2018-12-26 ENCOUNTER — Other Ambulatory Visit: Payer: Self-pay | Admitting: Gastroenterology

## 2018-12-26 VITALS — BP 105/72 | HR 118 | Temp 98.9°F | Resp 18 | Ht 65.0 in | Wt 145.0 lb

## 2018-12-26 DIAGNOSIS — K50811 Crohn's disease of both small and large intestine with rectal bleeding: Secondary | ICD-10-CM | POA: Diagnosis not present

## 2018-12-26 MED ORDER — SHINGRIX 50 MCG/0.5ML IM SUSR: 0.5000 mL | Freq: Once | INTRAMUSCULAR | 0 refills | Status: DC

## 2018-12-26 NOTE — Progress Notes (Signed)
we'll  Leroy Darby, MD [address]  [place]  [place], [place] [address]  Main: [phone]  Fax: [phone]    Gastroenterology Consultation  Referring Provider:     [name], MD Primary Care Physician:  [name], MD Primary Gastroenterologist:  Dr. Cephas Hernandez Reason for Consultation:     Crohn's disease        HPI:   Leroy Hernandez is a 33 y.o. y/o male referred by Dr. Ouida Sills, [name], MD  for consultation & management of Crohn's disease. He has history of ileocolonic Crohn's diagnosed in 2007, detailed history described below is referred by [name] to establish care.   Follow-up visit 11/14/2018 Patient has not seen me in last 1 year.  He felt he did not need follow-up as he was doing good.  He continues to take Remicade 10 mg/KG body weight every 8 weeks.  He ran out of methotrexate about 3 weeks ago.  Over the last 2 weeks, he has been experiencing postprandial urgency noticing streaks of blood.  He had temporary drainage of the perianal fistula but stopped at present it was tender as well.  He no longer has tenderness or drainage of his fistula.  He denies abdominal pain, nausea or vomiting.  His weight has been stable.  He was seen by [facility] dermatology for evaluation of skin lesion on his left temple, confirmed squamous cell carcinoma in situ, stage I which was treated with Mohs surgery.  The scar looks completely healed.  He does not have a follow-up with dermatology yet.  He needs to see endocrinology for follow-up of osteoporosis.  He cancel his bone density testing and waiting to reschedule  Follow-up visit 12/26/2018 Patient was recently hospitalized to [facility] secondary to flareup of ileocolonic Crohn's resulting in diarrhea, rectal discomfort, rectal bleeding, weight loss.  Found to have elevated CRP and leukocytosis.  Significantly elevated fecal calprotectin at 600, stool studies negative for infection including C. difficile.   Started him on antibiotics, steroids.  Underwent colonoscopy which revealed active ileocolonic disease and worse in the rectum.  Discharged him on oral methotrexate 25 mg weekly along with daily folate, 2 weeks course of Cipro and Flagyl, prednisone taper, Rowasa enema along with high-dose Remicade.  Since discharge, patient reports that he feels back to normal.  Currently having up to 3 mushy, nonbloody bowel movements daily.  No longer experiencing rectal bleeding, rectal discomfort.  He reports that his appetite has picked up, able to eat regularly.  He is going back to work.  He is scheduled for next Remicade infusion at home on Friday this week  Crohn's disease classification:  Age: 52 to 42 Location: ileocolonic  Behavior: stricturing and penetrating  Perianal: Yes  IBD diagnosis:2007  Disease course: He was diagnosed in 2007, was experiencing right lower quadrant pain and bloating, was on oral mesalamine, several courses of prednisone, underwent first surgery in 2011 of transverse colon resection at [facility]. He continued to have diarrhea and required repeated courses of prednisone, established care at [facility] and underwent colonoscopy which revealed severe ileocolonic disease, underwent ileocolonic resection in 11/2011. He reports being started on Humira and Imuran since then and was doing well for some time. He was also taking marijuana and it significantly helped with his symptoms. Subsequently, he had frequent flareups, several hospitalizations and ER visits almost every month secondary to perianal fistula, abscess and bloody diarrhea. He was receiving short courses of antibiotics. He  had I&D of right perirectal abscesses in May/2017. He does not recall having a seton placement. His colonoscopy in 03/2015 revealed mild active colitis and anastomotic stricture that was dilated with balloon to 12 mm. He underwent another colonoscopy in 12/2015 which revealed inflammation in the  neoterminal ileum as well as colon and was found to have moderately active ileocolonic disease. He was then switched to [place] in 05/2016 and stayed on Imuran 150 mg daily. He was feeling better overall but continued to have active perianal disease resulting in urgency and bloody stools. CT A/P from 06/2016 revealed right perirectal fistula with developing fluid collection concerning for abscess. MRE from 09/2016 revealed no evidence of active disease, however the fistula could not be visualized. His CRP improved from 10 in 06/2016 -1.7 in 09/2016. 09/2016 Trial of cipro+flagyl BID and rowasa enema resulted in significant improvement of symptoms. 11/2016 Return of fistula and rectal bleeding after tapering antibiotic. 12/2016 Restarted cipro, entocort, cortenema, decreased imuran to 28m. EGD and colonoscopy revealed gastric granuloma, Rutgeert's i4. Stool studies negative. Fecal calprotectin 370. C. Difficile infection in 04/2017, treated with oral vancomycin. Flareup of Crohn's since 07/2017, took 4 weeks course of prednisone while on Remicade and methotrexate, mild improvement. Stool studies negative for infection including C. Difficile.  [facility] [place] [facility], IV steroids, 2 weeks antibiotics, Rowasa enemas, colonoscopy showed progression of disease from i4 to i2. Remicade trough levels 2.4, undetectable antibodies.  Increased Remicade to 10 mg/kg, first maintenance dose in 11/2017.  Patient had a flareup of Crohn's colitis in 11/2018 when he was off methotrexate for about a month, requiring hospitalization.  Underwent colonoscopy in 11/2018 which revealed active ileocolonic disease, worse in the rectum.  Received prednisone, antibiotics, topical mesalamine, restarted methotrexate, continued high-dose Remicade.  Undetectable Remicade antibodies.  Therapeutic Remicade trough levels.  Extra intestinal manifestations: Imaging revealed chronic sacroiliitis. He denies skin, eye manifestations  IBD surgical  history: 2011 : Intestinal resection, transverse colon Diagnosis:  TRANSVERSE COLON AND PROXIMAL TRANSVERSE COLON RESECTION:  SEGMENTS OF BOWEL WITH MODERATE TO FOCALLY SEVERE CHRONIC ACTIVE COLITIS.  EXTENSIVE TRANSMURAL INFLAMMATION.  CRYTPITIS AND CRYPT ABSCESSES ARE NOTED.  THE RESECTION MARGINS APPEAR CLEAR.  THERE IS NO EVIDENCE OF DYSPLASIA OR MALIGNANCY.  NINETEEN BENIGN LYMPH NODES. (0/19)  [facility] 9 cm TI, 5.5 cm long large bowel Diagnosis:  A:Ileum and colon, resection  -Segment of ileum and colon with moderate to severely active chronic  enteritis, mildly active chronic colitis, and intact anastomosis at distal end  of specimen  -Surgical margins are viable; mildly active chronic colitis is present at  distal margin  -Apparent stricture near ileocecal valve  -Inflammatory polyp near ileocecal valve  -Fibrous obliteration of appendiceal lumen  -Six unremarkable lymph nodes examined microscopically  -No granulomas, viral cytopathic effect, or dysplasia identified   07/04/2015-incision and drainage of right perirectal abscess Imaging:  MRE-09/21/2016 IMPRESSION: 1. Stable surgical changes from prior colon resection and ileal colonic anastomosis. No findings for active Crohn's disease. 2. No acute abdominal/pelvic findings, mass lesions or adenopathy.  CT A/P 06/22/2016 IMPRESSION: 1. The fistula between the right lateral aspect of the rectum and the skin is again identified. The portion of the fistula immediately beneath the skin is more prominent in caliber in the interval with central decreased attenuation worrisome for a developing fluid collection in the distal fistula immediately beneath the cutaneous surface. A developing abscess cannot be excluded. Recommend clinical correlation. 2. Fluid throughout the  remaining colon consistent with history of diarrhea. No colonic or small bowel  inflammation identified. The rectum is poorly evaluated due to lack of distention. 3. 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  SBFT none  Procedures: Colonoscopy 09/2009 Diagnosis:  TRANSVERSE COLON MUCOSA COLD BIOPSY:  - MODERATE CHRONIC ACTIVE COLITIS, CONSISTENT WITH PATIENTS  HISTORY OF CROHNS DISEASE.  - NEGATIVE FOR DYSPLASIA AND MALIGNANCY.   Colonoscopy 06/2010 Diagnosis:  ULCERATED RIGHT SIDED COLON BIOPSY:  - MILD CHRONIC ACTIVE COLITIS, COMPATIBLE WITH PATIENTS KNOWN  HISTORY OF CROHNS DISEASE.  - NEGATIVE FOR DYSPLASIA AND MALIGNANCY.   Colonoscopies 07/2012 Diagnosis:  ANASTOMOTIC STRICTURE COLD BIOPSY:  - MILD CHRONIC ACTIVE COLITIS, COMPATIBLE WITH PATIENTS KNOWN  HISTORY OF CROHNS DISEASE.  - NEGATIVE FOR DYSPLASIA AND MALIGNANCY.   Colonoscopies 07/2013 Diagnosis:  ILEUM BIOPSY:  - SMALL BOWEL MUCOSA WITH PRESERVED VILLOUS ARCHITECTURE.  - NEGATIVE FOR INTRAEPITHELIAL LYMPHOCYTOSIS, DYSPLASIA AND  MALIGNANCY.   Colonoscopy 03/2015 Multiple ulcers in the sigmoid colon, no bleeding was present biopsies were taken, tight stricture at the ileocolonic anastomosis unable to get through scope, dilated with 12 mm TTS balloon. The terminal ileum appeared normal DIAGNOSIS:  A. SIGMOID COLON; COLD BIOPSY:  - MILD CHRONIC ACTIVE COLITIS.  - NEGATIVE FOR DYSPLASIA AND MALIGNANCY.   Colonoscopy 12/2015 There was evidence of prior end-to-end ileocolonic anastomosis in the transverse colon. This was characterized by mild stenosis. The anastomosis was traversed. Scattered inflammation, moderate in severity and characterized by shallow ulcerations was found in the proximal ileum. Biopsies were taken with the cold forceps for histology. Inflammation characterized by shallow ulcerations were found no sites were spared. This was moderate in severity. Biopsies were taken. Perianal fistula found on perianal exam. Pathology: Small intestine-severe  chronic active ileitis with ulceration. 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 - Patent functional end-to-end ileo-colonic anastomosis, characterized by mild stenosis. Dilated to 24m. - Ulcerated mucosa in the neo-terminal ileum. Biopsied. - Mucosal ulceration in colon. Biopsied. - Active ileocolonic Crohn's disease, Rutgeert's i4 on cimzia and imuran - The distal rectum and anal verge are normal on retroflexion view.  Upper Endoscopy 12/2016 - Normal second portion of the duodenum and third portion of the duodenum. Biopsied. - Multiple non-bleeding duodenal ulcers with a clean ulcer base (Forrest Class III). Biopsied. - Normal stomach. Biopsied. - Normal gastroesophageal junction and esophagus.   Colonoscopy 09/14/2017 Perianal fistula and perianal skin tag found on perianal exam. - Patent functional end-to-end ileo-colonic anastomosis, characterized by edema, erythema and friable mucosa. - A few ulcers in the neo-terminal ileum. Rutgeert's i2 improved from i4 disease on remicade - Congested and superficial ulcerated mucosa in the rectum and in the recto-sigmoid colon. Biopsied.  Colonoscopy 11/30/2018 - Patent end-to-side ileo-colonic anastomosis, characterized by inflammation. - Crohn's disease, with ileitis and colitis. Inflammation was found. This was graded as Rutgeerts Score i2 (more than five aphthous lesions or skip areas of larger lesions or lesions confined to the ileocolonic anastomosis), unchanged compared to previous examinations. Biopsied. - Crohn's disease with colonic involvement. Inflammation was found in the rectum, in the  recto-sigmoid colon, in the sigmoid colon and in the ascending colon. This was mild in severity, worsened compared to previous examinations. Biopsied,  to rule out CMV, HSV on rectal ulcers - Perianal fistula and perianal skin tag found on perianal exam.  DIAGNOSIS:  A. NEOTERMINAL ILEUM; COLD BIOPSY:  - CHRONICALLY INFLAMED ILEOCOLONIC ANASTOMOSIS WITH MILD MUCOSAL  ACTIVITY.  - NEGATIVE FOR GRANULOMA, DYSPLASIA, AND MALIGNANCY.   B. COLON, RIGHT; COLD BIOPSY:  - CHRONIC COLITIS WITH MODERATE ACTIVITY (CRYPTITIS AND CRYPT  ABSCESSES).  - NEGATIVE FOR GRANULOMA, DYSPLASIA, AND MALIGNANCY.   C. COLON, LEFT; COLD BIOPSY:  - CHRONIC COLITIS WITH MODERATE ACTIVITY (CRYPTITIS AND CRYPT  ABSCESSES).  - NEGATIVE FOR GRANULOMA, DYSPLASIA, AND MALIGNANCY.   D. COLON, RECTOSIGMOID; COLD BIOPSY:  - CHRONIC PROCTO-COLITIS WITH SEVERE ACTIVITY (CRYPTITIS, CRYPT  ABSCESSES, ULCERATION, AND CRYPT DESTRUCTION).  - NEGATIVE FOR GRANULOMA, DYSPLASIA, AND MALIGNANCY.   E. RECTAL ULCER; COLD BIOPSY:  - CHRONIC PROCTITIS WITH SEVERE ACTIVITY (CRYPTITIS, CRYPT ABSCESSES,  ULCERATION, AND CRYPT DESTRUCTION).  - NEGATIVE FOR GRANULOMA, DYSPLASIA, AND MALIGNANCY.   ADDENDUM:  Immunohistochemical studies with appropriately reactive controls were  performed against HSV 1/2 and CMV for the rectosigmoid colon biopsy and  rectal ulcer (specimens D and E). These studies are negative.   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 Stopped Imuran in 01/2017 Methotrexate: 25 mg by mouth started in 02/2017 weekly along with daily folate  Biologics:  Anti TNFs: Humira until end of 2017, stopped due to recurrence of postoperative Crohn's. Cimzia started in 05/28/2016 along  with Imuran. Cimzia stopped in 01/2017. Remicade 5 mg/kg started in 02/2017, maintenance every 8 weeks, infliximab trough levels 2.4ug/ml on 09/09/2017, antibodies less than 22, increased Remicade to 10 mg/kg in 11/2017 Anti Integrins:  Ustekinumab: Tofactinib: Clinical trial:   Past Medical History:  Diagnosis Date   Anxiety    Asthma    as a child   Blood in stool    Calculus of gallbladder without cholecystitis without obstruction 12/25/2015   [place]    Depression    [place] 09/10[place]2017   Gallbladder polyp 12/25/2015   Gastroenteritis due to norovirus 05/03/2017   Generalized abdominal pain    H/O Clostridium difficile infection 05/03/2017   Iron deficiency anemia 12/28/2016   Perirectal abscess 07/04/2015   Overview:  Added automatically from request for surgery [id number]  Rectal fistula    [place] 06/22/[place]    Past Surgical History:  Procedure Laterality Date   COLON RESECTION     COLON SURGERY     COLONOSCOPY N/A 11/30/2018   Procedure: COLONOSCOPY;  Surgeon: VLin Landsman MD;  [place]  Service: Gastroenterology;  Laterality: N/A;   COLONOSCOPY WITH PROPOFOL N/A 04/03/2015   Procedure: COLONOSCOPY WITH PROPOFOL;  Surgeon: PHulen Luster MD;  [facility]  Service: Endoscopy;  Laterality: N/A;   COLONOSCOPY WITH PROPOFOL N/A 12/22/2015   Procedure: COLONOSCOPY WITH PROPOFOL;  Surgeon: DLucilla Lame MD;  [place]  Service: Endoscopy;  Laterality: N/A;   COLONOSCOPY WITH PROPOFOL N/A 12/22/2016   Procedure: COLONOSCOPY WITH PROPOFOL;  Surgeon: VLin Landsman MD;  [place] [facility]  Service: Endoscopy;  Laterality: N/A;   COLONOSCOPY WITH PROPOFOL N/A 09/14/2017   Procedure: COLONOSCOPY WITH PROPOFOL;  Surgeon: VLin Landsman MD;  [facility];  Service: Gastroenterology;  Laterality: N/A;   ESOPHAGOGASTRODUODENOSCOPY (EGD)  WITH PROPOFOL N/A 12/22/2016   Procedure: ESOPHAGOGASTRODUODENOSCOPY (EGD) WITH PROPOFOL;  Surgeon: Lin Landsman, MD;  Location: [place];  Service: Endoscopy;  Laterality: N/A;   rectal fistula surgery     WRIST SURGERY       Current Outpatient Medications:    ARIPiprazole (ABILIFY) 5 MG tablet, Take 5 mg by mouth daily., Disp: , Rfl:    escitalopram (LEXAPRO) 10 MG tablet, Take 1 tablet (10 mg total) by mouth daily., Disp: 90 tablet, Rfl: 1   folic acid (FOLVITE) 1 MG tablet, Take 1 tablet (1 mg total) by mouth daily., Disp: 90 tablet, Rfl: 3   hydrOXYzine (VISTARIL) 25 MG capsule, Take 1 capsule (25 mg total) by mouth daily as needed (severe anxiety attacks)., Disp: 90 capsule, Rfl: 1   inFLIXimab (REMICADE) 100 MG injection, Inject 100 mg into the vein every 8 (eight) weeks., Disp: , Rfl:    methotrexate (RHEUMATREX) 2.5 MG tablet, Take 10 tablets (25 mg total) by mouth once a week. Caution:Chemotherapy. Protect from light., Disp: 120 tablet, Rfl: 0   oxyCODONE (OXY IR/ROXICODONE) 5 MG immediate release tablet, oxycodone 5 mg tablet, Disp: , Rfl:    predniSONE (DELTASONE) 10 MG tablet, Take 40 mg PO Daily X 2 weeks, then 30 mg Daily X 1 week, then 20 mg Daily X 1 week, then 10 mg Daily X 1 week and then STOP., Disp: 100 tablet, Rfl: 0   HYDROcodone-acetaminophen (NORCO) 5-325 MG tablet, Take 1 tablet by mouth every 6 (six) hours as needed for moderate pain. (Patient not taking: Reported on 12/26/2018), Disp: 15 tablet, Rfl: 0   mesalamine (ROWASA) 4 g enema, Place 60 mLs (4 g total) rectally at bedtime for 14 days., Disp: 840 mL, Rfl: 0   Family History  Problem Relation Age of Onset   Diabetes Paternal Grandfather    Heart disease Paternal Grandfather    Depression Mother    Drug abuse Mother    Osteoporosis Neg Hx      Social History   Tobacco Use   Smoking status: Never Smoker   Smokeless tobacco: Former Systems developer  Substance Use Topics    Alcohol use: Yes    Frequency: Never    Comment: once a month   Drug use: No    Comment: hx of 3 year ago     Allergies as of 12/26/2018   (No Known Allergies)    Review of Systems:    All systems reviewed and negative except where noted in HPI.   Physical Exam:  BP 105/72 (BP Location: Left Arm, Patient Position: Sitting, Cuff Size: Large)    Pulse (!) 118    Temp 98.9 F (37.2 C)    Resp 18    Ht 5' 5"  (1.651 m)    Wt 145 lb (65.8 kg)    BMI 24.13 kg/m  No LMP for male patient. Filed Weights   12/26/18 1316  Weight: 145 lb (65.8 kg)   General:   Alert, well-built, well-nourished, pleasant and cooperative in NAD Head:  Normocephalic and atraumatic. Eyes:  Sclera clear, no icterus.   Conjunctiva pale. Ears:  Normal auditory acuity. Nose:  No deformity, discharge, or lesions. Mouth:  No deformity or lesions,oropharynx pink & moist. Neck:  Supple; no masses or thyromegaly. Lungs:  Respirations even and unlabored.  Clear throughout to auscultation.   No wheezes, crackles, or rhonchi. No acute distress. Heart:  Regular rate and rhythm; no murmurs, clicks, rubs, or gallops. Abdomen:  Normal bowel  sounds.  No bruits.  Soft, non-tender nondistended without masses, hepatosplenomegaly or hernias noted.  No guarding or rebound tenderness.  Midline vertical scar from prior surgery Rectal: Not performed Msk:  Symmetrical without gross deformities. Good, equal movement & strength bilaterally. Pulses:  Normal pulses noted. Extremities:  No clubbing or edema.  No cyanosis. Neurologic:  Alert and oriented x3;  grossly normal neurologically. Skin:  Intact without significant lesions or rashes. No jaundice, well-healed scar in the left temple. Psych:  Alert and cooperative. Normal mood and affect.   Assessment and Plan:   ELSTER CORBELLO is a 33 y.o. caucasian male with Ileocolonic Crohn's disease diagnosed in 2007, stricturing and penetrating with perianal fistula, previously on Humira  and Imuran, recurrence of postoperative Crohn's, resulted in discontinuation of Humira. Switched to [place] in 05/2016 biweekly and continued Imuran 150 mg daily which resulted in clinical remission followed by recurrence of luminal disease and perianal fistula that responded to antibiotics, then recurrence of symptoms after tapering abx, responded to entocort, EGD and colonoscopy 12/2016 revealed gastric crohn's and post op recurrence of Crohn's Rutgeert's i4, anastomotic stricture and status post dilation. Switched to Remicade and methotrexate.  Temporarily in clinical remission, episode of C. Difficile treated with oral vancomycin, followed by flareup, currently on high-dose Remicade along with methotrexate, in clinical remission   Crohn's disease: 1. Continue high-dose Remicade, home infusion, maintenance at 8 weeks interval, no evidence of infliximab antibodies, infliximab drug levels are at therapeutic level 2. Continue methotrexate 20 mg weekly with daily folic acid 1 mg 3.  Recheck fecal calprotectin levels during next visit in 3 months  IBD Health Maintenance  1.TB status: PPD skin test negative as of October, 2017, quantiferon gold Negative, checked on 07/20/2017.  Recheck QuantiFERON gold test today 2. Anemia: had iron deficiency anemia secondary to active Crohn's disease, resolved. Received parenteral iron infusions, 2, vitamin B12 is 397. Last ferritin level 114 in 07/2017 3.Immunizations: Hep A and B not immune, recommend Twinrix vaccine, received 2 doses, administer third dose during next visit.  Influenza-received annual influenza vaccine up-to-date, received Prevnar in 2019 and pneumovax in 11/2018, recommend Shingrix vaccine 4.Cancer screening I) Colon cancer/dysplasia surveillance: Last colonoscopy in 11/2018, no dysplasia II) Cervical cancer: N/A III) Skin cancer -SCC I-S, stage I in left temple, status post Mohs surgery.  Counseled about annual skin exam by dermatology and skin  protection in summer using sun screen SPF > 50, clothing 5.Bone health: Has osteoporosis, being followed by endocrine Vitamin D status: Normal as of 03/16/2018 Bone density testing: DEXA scan 09/29/2016 revealed osteoporosis, Z-score -3.3 5. Labs: Recheck labs every 62month 6. Smoking: Never smoker 7. NSAIDs and Antibiotics use: No history of NSAID use  Follow up in 368month    RoCephas DarbyMD

## 2018-12-28 LAB — QUANTIFERON-TB GOLD PLUS
QuantiFERON Mitogen Value: 0.02 IU/mL
QuantiFERON Nil Value: 0.02 IU/mL
QuantiFERON TB1 Ag Value: 0.02 IU/mL
QuantiFERON TB2 Ag Value: 0.02 IU/mL
QuantiFERON-TB Gold Plus: UNDETERMINED — AB

## 2018-12-29 ENCOUNTER — Ambulatory Visit: Admit: 2018-12-29 | Payer: BC Managed Care – PPO | Admitting: Gastroenterology

## 2018-12-29 ENCOUNTER — Other Ambulatory Visit: Payer: Self-pay

## 2018-12-29 DIAGNOSIS — K50811 Crohn's disease of both small and large intestine with rectal bleeding: Secondary | ICD-10-CM

## 2018-12-29 SURGERY — COLONOSCOPY WITH PROPOFOL
Anesthesia: General

## 2019-01-09 ENCOUNTER — Encounter: Payer: Self-pay | Admitting: Gastroenterology

## 2019-01-10 ENCOUNTER — Encounter: Payer: Self-pay | Admitting: Gastroenterology

## 2019-02-09 HISTORY — PX: RECTAL SURGERY: SHX760

## 2019-02-16 ENCOUNTER — Other Ambulatory Visit: Payer: Self-pay | Admitting: Gastroenterology

## 2019-02-16 DIAGNOSIS — K5080 Crohn's disease of both small and large intestine without complications: Secondary | ICD-10-CM

## 2019-03-05 ENCOUNTER — Encounter: Payer: Self-pay | Admitting: Gastroenterology

## 2019-03-06 ENCOUNTER — Telehealth: Payer: Self-pay | Admitting: Gastroenterology

## 2019-03-06 NOTE — Telephone Encounter (Signed)
-----   Message from Lin Landsman, MD sent at 03/05/2019  5:12 PM EST ----- Regarding: Schedule follow-up He should see me sometime in February  RV

## 2019-03-06 NOTE — Telephone Encounter (Signed)
Left vm to offer f/u pt per Dr. Verlin Grills note in February

## 2019-03-12 ENCOUNTER — Telehealth: Payer: Self-pay | Admitting: Gastroenterology

## 2019-03-12 NOTE — Telephone Encounter (Signed)
-----   Message from Lin Landsman, MD sent at 03/05/2019  5:12 PM EST ----- Regarding: Schedule follow-up He should see me sometime in February  RV

## 2019-03-12 NOTE — Telephone Encounter (Signed)
Left vm top offer apt with Dr. [name] per her note

## 2019-03-18 ENCOUNTER — Inpatient Hospital Stay
Admission: EM | Admit: 2019-03-18 | Discharge: 2019-03-20 | DRG: 386 | Disposition: A | Discharge status: Home / Self Care | Payer: BC Managed Care – PPO | Attending: Internal Medicine | Admitting: Internal Medicine

## 2019-03-18 ENCOUNTER — Observation Stay: Payer: BC Managed Care – PPO

## 2019-03-18 ENCOUNTER — Other Ambulatory Visit: Payer: Self-pay

## 2019-03-18 ENCOUNTER — Encounter: Payer: Self-pay | Admitting: Emergency Medicine

## 2019-03-18 DIAGNOSIS — Z8249 Family history of ischemic heart disease and other diseases of the circulatory system: Secondary | ICD-10-CM

## 2019-03-18 DIAGNOSIS — D509 Iron deficiency anemia, unspecified: Secondary | ICD-10-CM | POA: Diagnosis present

## 2019-03-18 DIAGNOSIS — K604 Rectal fistula: Secondary | ICD-10-CM | POA: Diagnosis present

## 2019-03-18 DIAGNOSIS — F419 Anxiety disorder, unspecified: Secondary | ICD-10-CM | POA: Diagnosis present

## 2019-03-18 DIAGNOSIS — K50818 Crohn's disease of both small and large intestine with other complication: Secondary | ICD-10-CM | POA: Diagnosis not present

## 2019-03-18 DIAGNOSIS — Z79899 Other long term (current) drug therapy: Secondary | ICD-10-CM

## 2019-03-18 DIAGNOSIS — J4520 Mild intermittent asthma, uncomplicated: Secondary | ICD-10-CM | POA: Diagnosis present

## 2019-03-18 DIAGNOSIS — K50119 Crohn's disease of large intestine with unspecified complications: Secondary | ICD-10-CM | POA: Diagnosis present

## 2019-03-18 DIAGNOSIS — K50813 Crohn's disease of both small and large intestine with fistula: Principal | ICD-10-CM | POA: Diagnosis present

## 2019-03-18 DIAGNOSIS — Z20822 Contact with and (suspected) exposure to covid-19: Secondary | ICD-10-CM | POA: Diagnosis present

## 2019-03-18 DIAGNOSIS — E860 Dehydration: Secondary | ICD-10-CM | POA: Diagnosis not present

## 2019-03-18 DIAGNOSIS — Z7952 Long term (current) use of systemic steroids: Secondary | ICD-10-CM

## 2019-03-18 DIAGNOSIS — Z818 Family history of other mental and behavioral disorders: Secondary | ICD-10-CM

## 2019-03-18 DIAGNOSIS — K50811 Crohn's disease of both small and large intestine with rectal bleeding: Secondary | ICD-10-CM | POA: Diagnosis present

## 2019-03-18 DIAGNOSIS — Z813 Family history of other psychoactive substance abuse and dependence: Secondary | ICD-10-CM

## 2019-03-18 DIAGNOSIS — K605 Anorectal fistula: Secondary | ICD-10-CM | POA: Diagnosis present

## 2019-03-18 DIAGNOSIS — K644 Residual hemorrhoidal skin tags: Secondary | ICD-10-CM | POA: Diagnosis present

## 2019-03-18 DIAGNOSIS — F330 Major depressive disorder, recurrent, mild: Secondary | ICD-10-CM | POA: Diagnosis present

## 2019-03-18 DIAGNOSIS — Z833 Family history of diabetes mellitus: Secondary | ICD-10-CM

## 2019-03-18 DIAGNOSIS — Z9049 Acquired absence of other specified parts of digestive tract: Secondary | ICD-10-CM

## 2019-03-18 LAB — COMPREHENSIVE METABOLIC PANEL
ALT: 48 U/L — ABNORMAL HIGH (ref 0–44)
AST: 33 U/L (ref 15–41)
Albumin: 3.8 g/dL (ref 3.5–5.0)
Alkaline Phosphatase: 77 U/L (ref 38–126)
Anion gap: 5 (ref 5–15)
BUN: 8 mg/dL (ref 6–20)
CO2: 29 mmol/L (ref 22–32)
Calcium: 8.7 mg/dL — ABNORMAL LOW (ref 8.9–10.3)
Chloride: 105 mmol/L (ref 98–111)
Creatinine, Ser: 0.88 mg/dL (ref 0.61–1.24)
GFR calc Af Amer: >60 mL/min (ref >60)
GFR calc non Af Amer: >60 mL/min (ref >60)
Glucose, Bld: 97 mg/dL (ref 70–99)
Potassium: 3.5 mmol/L (ref 3.5–5.1)
Sodium: 139 mmol/L (ref 135–145)
Total Bilirubin: 0.8 mg/dL (ref 0.3–1.2)
Total Protein: 7.4 g/dL (ref 6.5–8.1)

## 2019-03-18 LAB — CBC
HCT: 42.7 % (ref 39.0–52.0)
Hemoglobin: 13.8 g/dL (ref 13.0–17.0)
MCH: 25.9 pg — ABNORMAL LOW (ref 26.0–34.0)
MCHC: 32.3 g/dL (ref 30.0–36.0)
MCV: 80.1 fL (ref 80.0–100.0)
Platelets: 295 10*3/uL (ref 150–400)
RBC: 5.33 MIL/uL (ref 4.22–5.81)
RDW: 12.6 % (ref 11.5–15.5)
WBC: 7.3 10*3/uL (ref 4.0–10.5)
nRBC: 0 % (ref 0.0–0.2)

## 2019-03-18 LAB — URINALYSIS, COMPLETE (UACMP) WITH MICROSCOPIC
Bilirubin Urine: NEGATIVE
Glucose, UA: NEGATIVE mg/dL
Hgb urine dipstick: NEGATIVE
Ketones, ur: 5 mg/dL — AB
Leukocytes,Ua: NEGATIVE
Nitrite: NEGATIVE
Protein, ur: 30 mg/dL — AB
Specific Gravity, Urine: 1.015 (ref 1.005–1.030)
pH: 6 (ref 5.0–8.0)

## 2019-03-18 LAB — LIPASE, BLOOD: Lipase: 31 U/L (ref 11–51)

## 2019-03-18 LAB — RESPIRATORY PANEL BY RT PCR (FLU A&B, COVID)
Influenza A by PCR: NEGATIVE
Influenza B by PCR: NEGATIVE
SARS Coronavirus 2 by RT PCR: NEGATIVE

## 2019-03-18 MED ORDER — METHYLPREDNISOLONE SODIUM SUCC 125 MG IJ SOLR
60.0000 mg | Freq: Once | INTRAMUSCULAR | Status: AC
  Administered 2019-03-18: 60 mg via INTRAVENOUS
  Filled 2019-03-18: qty 2

## 2019-03-18 MED ORDER — HYDROMORPHONE HCL 1 MG/ML IJ SOLN
1.0000 mg | INTRAMUSCULAR | Status: DC | PRN
  Administered 2019-03-18 – 2019-03-20 (×9): 1 mg via INTRAVENOUS
  Filled 2019-03-18 (×10): qty 1

## 2019-03-18 MED ORDER — METHYLPREDNISOLONE SODIUM SUCC 125 MG IJ SOLR
60.0000 mg | Freq: Three times a day (TID) | INTRAMUSCULAR | Status: AC
  Administered 2019-03-19: 60 mg via INTRAVENOUS
  Filled 2019-03-18: qty 2

## 2019-03-18 MED ORDER — IOHEXOL 9 MG/ML PO SOLN
500.0000 mL | ORAL | Status: AC
  Administered 2019-03-18: 500 mL via ORAL

## 2019-03-18 MED ORDER — ONDANSETRON HCL 4 MG/2ML IJ SOLN
4.0000 mg | Freq: Once | INTRAMUSCULAR | Status: AC
  Administered 2019-03-18: 4 mg via INTRAVENOUS
  Filled 2019-03-18: qty 2

## 2019-03-18 MED ORDER — METRONIDAZOLE IN NACL 5-0.79 MG/ML-% IV SOLN
500.0000 mg | Freq: Three times a day (TID) | INTRAVENOUS | Status: DC
  Administered 2019-03-18 – 2019-03-20 (×6): 500 mg via INTRAVENOUS
  Filled 2019-03-18 (×8): qty 100

## 2019-03-18 MED ORDER — ENOXAPARIN SODIUM 40 MG/0.4ML ~~LOC~~ SOLN
40.0000 mg | SUBCUTANEOUS | Status: DC
  Administered 2019-03-18 – 2019-03-19 (×2): 40 mg via SUBCUTANEOUS
  Filled 2019-03-18 (×2): qty 0.4

## 2019-03-18 MED ORDER — IOHEXOL 300 MG/ML  SOLN
100.0000 mL | Freq: Once | INTRAMUSCULAR | Status: AC | PRN
  Administered 2019-03-18: 100 mL via INTRAVENOUS

## 2019-03-18 MED ORDER — SODIUM CHLORIDE 0.9 % IV SOLN: 100.0000 mg | INTRAVENOUS | Status: DC

## 2019-03-18 MED ORDER — ESCITALOPRAM OXALATE 10 MG PO TABS
10.0000 mg | ORAL_TABLET | Freq: Every day | ORAL | Status: DC
  Administered 2019-03-18 – 2019-03-20 (×3): 10 mg via ORAL
  Filled 2019-03-18 (×3): qty 1

## 2019-03-18 MED ORDER — PANTOPRAZOLE SODIUM 40 MG IV SOLR
40.0000 mg | Freq: Two times a day (BID) | INTRAVENOUS | Status: DC
  Administered 2019-03-18 – 2019-03-20 (×5): 40 mg via INTRAVENOUS
  Filled 2019-03-18 (×5): qty 40

## 2019-03-18 MED ORDER — SODIUM CHLORIDE 0.9 % IV BOLUS
1000.0000 mL | Freq: Once | INTRAVENOUS | Status: AC
  Administered 2019-03-18: 1000 mL via INTRAVENOUS

## 2019-03-18 MED ORDER — SODIUM CHLORIDE 0.9% FLUSH
3.0000 mL | Freq: Once | INTRAVENOUS | Status: AC
  Administered 2019-03-18: 3 mL via INTRAVENOUS

## 2019-03-18 MED ORDER — ACETAMINOPHEN 500 MG PO TABS
1000.0000 mg | ORAL_TABLET | Freq: Once | ORAL | Status: AC
  Administered 2019-03-18: 1000 mg via ORAL
  Filled 2019-03-18: qty 2

## 2019-03-18 MED ORDER — INFLIXIMAB 100 MG IV SOLR
100.0000 mg | INTRAVENOUS | Status: DC
  Filled 2019-03-18: qty 10

## 2019-03-18 MED ORDER — HYDROMORPHONE HCL 1 MG/ML IJ SOLN
0.5000 mg | INTRAMUSCULAR | Status: DC | PRN
  Administered 2019-03-18: 0.5 mg via INTRAVENOUS
  Filled 2019-03-18: qty 1

## 2019-03-18 MED ORDER — LACTATED RINGERS IV SOLN: INTRAVENOUS | Status: AC

## 2019-03-18 MED ORDER — CIPROFLOXACIN IN D5W 400 MG/200ML IV SOLN
400.0000 mg | Freq: Two times a day (BID) | INTRAVENOUS | Status: DC
  Administered 2019-03-18 – 2019-03-20 (×4): 400 mg via INTRAVENOUS
  Filled 2019-03-18 (×5): qty 200

## 2019-03-18 MED ORDER — ARIPIPRAZOLE 5 MG PO TABS
5.0000 mg | ORAL_TABLET | Freq: Every day | ORAL | Status: DC
  Administered 2019-03-18 – 2019-03-20 (×3): 5 mg via ORAL
  Filled 2019-03-18 (×4): qty 1

## 2019-03-18 MED ORDER — HYDROXYZINE PAMOATE 25 MG PO CAPS
25.0000 mg | ORAL_CAPSULE | Freq: Every day | ORAL | Status: DC | PRN
  Filled 2019-03-18: qty 1

## 2019-03-18 NOTE — Assessment & Plan Note (Signed)
Pt has h/o perirectal fistula and h/o colon resection in past.  We will proceed with CT abd and pelvis with contrast.

## 2019-03-18 NOTE — H&P (Addendum)
History and Physical    Leroy Hernandez WPY:[id number] DOB: Oct 14, 1985 DOA: 03/18/2019    PCP: [name], MD   Outpatient Specialists: Dr.Vanga   Patient coming from: Home  Chief Complaint: vomiting / BRBPR /perirectal pain.   HPI: Leroy Hernandez is a 34 y.o. male with medical history significant of Crohn's disease coming to ER for perirectal pain. He sees GI Dr.Vanga. pt is taking his meds and is compliant. Pt denies any chest pain or sob or symptoms.  He has chron's since age of 53.  ED Course:  Blood pressure 116/80, pulse (!) 105, temperature 98.6 F (37 C), temperature source Oral, resp. rate 18, height 5' 5"  (1.651 m), weight 63.5 kg, SpO2 99 %. In ed pt has received IVF NS 1L bolus / zofran/ tylenol.   Review of Systems: As per HPI otherwise 10 point review of systems negative.   Past Medical History:  Diagnosis Date  . Anxiety   . Asthma    as a child  . Blood in stool   . Calculus of gallbladder without cholecystitis without obstruction 12/25/2015  . Crohn's disease ([place])   . Depression   . Exacerbation of Crohn's disease ([place]) 09/10/2017  . Gallbladder polyp 12/25/2015  . Gastroenteritis due to norovirus 05/03/2017  . Generalized abdominal pain   . H/O Clostridium difficile infection 05/03/2017  . Iron deficiency anemia 12/28/2016  . Iron deficiency anemia 12/28/2016  . Perirectal abscess 07/04/2015   Overview:  Added automatically from request for surgery [phone]  . Rectal fistula   . Sepsis ([place]) 06/22/2016    Past Surgical History:  Procedure Laterality Date  . COLON RESECTION    . COLON SURGERY    . COLONOSCOPY N/A 11/30/2018   Procedure: COLONOSCOPY;  Surgeon: Lin Landsman, MD;  Location: [facility] ENDOSCOPY;  Service: Gastroenterology;  Laterality: N/A;  . COLONOSCOPY WITH PROPOFOL N/A 04/03/2015   Procedure: COLONOSCOPY WITH PROPOFOL;  Surgeon: Hulen Luster, MD;  Location: [facility] ENDOSCOPY;  Service: Endoscopy;  Laterality: N/A;  . COLONOSCOPY  WITH PROPOFOL N/A 12/22/2015   Procedure: COLONOSCOPY WITH PROPOFOL;  Surgeon: [name], MD;  Location: [place];  Service: Endoscopy;  Laterality: N/A;  . COLONOSCOPY WITH PROPOFOL N/A 12/22/2016   Procedure: COLONOSCOPY WITH PROPOFOL;  Surgeon: Lin Landsman, MD;  Location: [place];  Service: Endoscopy;  Laterality: N/A;  . COLONOSCOPY WITH PROPOFOL N/A 09/14/2017   Procedure: COLONOSCOPY WITH PROPOFOL;  Surgeon: Lin Landsman, MD;  Location: [facility] ENDOSCOPY;  Service: Gastroenterology;  Laterality: N/A;  . ESOPHAGOGASTRODUODENOSCOPY (EGD) WITH PROPOFOL N/A 12/22/2016   Procedure: ESOPHAGOGASTRODUODENOSCOPY (EGD) WITH PROPOFOL;  Surgeon: Lin Landsman, MD;  Location: [place];  Service: Endoscopy;  Laterality: N/A;  . rectal fistula surgery    . WRIST SURGERY       reports that he has never smoked. He has quit using smokeless tobacco. He reports current alcohol use. He reports that he does not use drugs.  No Known Allergies  Family History  Problem Relation Age of Onset  . Diabetes Paternal Grandfather   . Heart disease Paternal Grandfather   . Depression Mother   . Drug abuse Mother   . Osteoporosis Neg Hx      Prior to Admission medications   Medication Sig Start Date End Date Taking? Authorizing Provider  ARIPiprazole (ABILIFY) 5 MG tablet Take 5 mg by mouth daily. 12/15/18   [provider]  escitalopram (LEXAPRO) 10 MG tablet Take 1 tablet (10  mg total) by mouth daily. 02/20/18   Ursula Alert, MD  HYDROcodone-acetaminophen (NORCO) 5-325 MG tablet Take 1 tablet by mouth every 6 (six) hours as needed for moderate pain. Patient not taking: Reported on 12/26/2018 12/01/18   [name], MD  hydrOXYzine (VISTARIL) 25 MG capsule Take 1 capsule (25 mg total) by mouth daily as needed (severe anxiety attacks). 07/15/17   Ursula Alert, MD  inFLIXimab (REMICADE) 100 MG injection Inject 100 mg into the vein every 8  (eight) weeks.    [provider]  mesalamine (ROWASA) 4 g enema Place 60 mLs (4 g total) rectally at bedtime for 14 days. 12/01/18 12/15/18  [name], MD  methotrexate (RHEUMATREX) 2.5 MG tablet TAKE 10 TABLETS (25 MG TOTAL) BY MOUTH ONCE A WEEK. CAUTION:CHEMOTHERAPY. PROTECT FROM LIGHT. 02/19/19 05/20/19  Lin Landsman, MD  oxyCODONE (OXY IR/ROXICODONE) 5 MG immediate release tablet oxycodone 5 mg tablet    [provider]  predniSONE (DELTASONE) 10 MG tablet Take 40 mg PO Daily X 2 weeks, then 30 mg Daily X 1 week, then 20 mg Daily X 1 week, then 10 mg Daily X 1 week and then STOP. 12/01/18   [name], MD    Physical Exam: Vitals:   03/18/19 1050 03/18/19 1052  BP:  116/80  Pulse:  (!) 105  Resp:  18  Temp:  98.6 F (37 C)  TempSrc:  Oral  SpO2:  99%  Weight: 63.5 kg   Height: 5' 5"  (1.651 m)    Constitutional: NAD, calm, comfortable Vitals:   03/18/19 1050 03/18/19 1052  BP:  116/80  Pulse:  (!) 105  Resp:  18  Temp:  98.6 F (37 C)  TempSrc:  Oral  SpO2:  99%  Weight: 63.5 kg   Height: 5' 5"  (1.651 m)    Eyes: PERRL, lids and conjunctivae normal ENMT: Mucous membranes are moist. Posterior pharynx clear of any exudate or lesions.Normal dentition.  Neck: normal, supple, no masses, no thyromegaly Respiratory: clear to auscultation bilaterally, no wheezing, no crackles. Normal respiratory effort. No accessory muscle use.  Cardiovascular: Regular rate and rhythm, no murmurs / rubs / gallops. No extremity edema. 2+ pedal pulses. No carotid bruits.  Abdomen: no tenderness, no masses palpated. No hepatosplenomegaly. Bowel sounds positive.  Musculoskeletal: no clubbing / cyanosis. No joint deformity upper and lower extremities. Good ROM, no contractures. Normal muscle tone.  Skin: no rashes, lesions, ulcers. No induration Neurologic: CN 2-12 grossly intact. Sensation intact, DTR normal. Strength 5/5 in all 4.  Psychiatric: Normal judgment and  insight. Alert and oriented x 3. Normal mood.   Labs on Admission: I have personally reviewed following labs and imaging studies  CBC: Recent Labs  Lab 03/18/19 1053  WBC 7.3  HGB 13.8  HCT 42.7  MCV 80.1  PLT 098   Basic Metabolic Panel: Recent Labs  Lab 03/18/19 1053  NA 139  K 3.5  CL 105  CO2 29  GLUCOSE 97  BUN 8  CREATININE 0.88  CALCIUM 8.7*   GFR: Estimated Creatinine Clearance: 102.9 mL/min (by C-G formula based on SCr of 0.88 mg/dL). Liver Function Tests: Recent Labs  Lab 03/18/19 1053  AST 33  ALT 48*  ALKPHOS 77  BILITOT 0.8  PROT 7.4  ALBUMIN 3.8   Recent Labs  Lab 03/18/19 1053  LIPASE 31   No results for input(s): AMMONIA in the last 168 hours. Coagulation Profile: No results for input(s): INR, PROTIME in the last  168 hours. Cardiac Enzymes: No results for input(s): CKTOTAL, CKMB, CKMBINDEX, TROPONINI in the last 168 hours. BNP (last 3 results) No results for input(s): PROBNP in the last 8760 hours. HbA1C: No results for input(s): HGBA1C in the last 72 hours. CBG: No results for input(s): GLUCAP in the last 168 hours. Lipid Profile: No results for input(s): CHOL, HDL, LDLCALC, TRIG, CHOLHDL, LDLDIRECT in the last 72 hours. Thyroid Function Tests: No results for input(s): TSH, T4TOTAL, FREET4, T3FREE, THYROIDAB in the last 72 hours. Anemia Panel: No results for input(s): VITAMINB12, FOLATE, FERRITIN, TIBC, IRON, RETICCTPCT in the last 72 hours. Urine analysis:    Component Value Date/Time   COLORURINE YELLOW (A) 09/06/2017 1921   APPEARANCEUR CLEAR (A) 09/06/2017 1921   APPEARANCEUR Hazy 03/14/2013 1455   LABSPEC 1.023 09/06/2017 1921   LABSPEC 1.019 03/14/2013 1455   PHURINE 5.0 09/06/2017 1921   GLUCOSEU NEGATIVE 09/06/2017 1921   GLUCOSEU Negative 03/14/2013 1455   HGBUR NEGATIVE 09/06/2017 1921   BILIRUBINUR NEGATIVE 09/06/2017 1921   BILIRUBINUR Negative 03/14/2013 1455   KETONESUR NEGATIVE 09/06/2017 1921   PROTEINUR  NEGATIVE 09/06/2017 1921   NITRITE NEGATIVE 09/06/2017 1921   LEUKOCYTESUR NEGATIVE 09/06/2017 1921   LEUKOCYTESUR Negative 03/14/2013 1455    Radiological Exams on Admission: No results found.   Assessment/Plan  Crohn's disease of both small and large intestine with other complication ([place]) Assessment & Plan Pt is continued on his regimen of methotrexate ,remicaide , and  mesalamine. Started soft diet after ct and/pelvis. Iv ciprofloxacin ,flagyl. LR @ 100 x 24 hours.  Perirectal fistula Assessment & Plan Pt has h/o perirectal fistula and h/o colon resection in past.  We will proceed with CT abd and pelvis with contrast.   Asthma, mild intermittent Assessment & Plan Stable with PRN albuterol as needed.  Mild episode of recurrent major depressive disorder [facility]) Assessment & Plan Pt is continued on his abilify, lexapro and vistaril.   DVT prophylaxis: Lovenox.   Code Status: full Family Communication: none at bedside  Disposition Plan: home  Consults called: none  Admission status: observation  Para Skeans MD [facility] If 7PM-7AM, please contact night-coverage [email or web address] Password [email or web address]  03/18/2019, 12:10 PM

## 2019-03-18 NOTE — ED Triage Notes (Signed)
Pt presents to ED via POV, pt states hx of Crohn's, reports rectal pain x 2 weeks, decreased appetite, and emesis, reports 2 episodes of emesis today. Pt also reports intermittent blood stools.

## 2019-03-18 NOTE — Assessment & Plan Note (Signed)
Stable with PRN albuterol as needed.

## 2019-03-18 NOTE — ED Provider Notes (Signed)
[facility] Emergency Department Provider Note  ____________________________________________   First MD Initiated Contact with Patient 03/18/19 1128     (approximate)  I have reviewed the triage vital signs and the nursing notes.   HISTORY  Chief Complaint Emesis and Rectal Pain    HPI Leroy Hernandez is a 34 y.o. male with Crohn's disease who comes in with rectal pain x2 weeks and decreased appetite and emesis.  Patient was last admitted in October 2020.  Patient was also found to have a perianal fistula and perianal skin tag.  Patient was given IV antibiotics, Cipro and Flagyl and steroids.  He was discharged on oral steroid taper for 4 weeks and followed up with Dr. [name] GI.  Patient is on Remicade as well as methotrexate.  Patient states he has been compliant with his medications but has had 2 weeks of worsening diarrhea.  He states he has about 9 episodes daily, intermittent, nothing makes better, nothing makes it worse.  Associated with some bloody stools as well as some discomfort in his rectum when he defecates.  Also a little bit of abdominal cramping as well.  He states he has vomited about 2 times daily and just has not had an appetite.       Past Medical History:  Diagnosis Date  . Anxiety   . Asthma    as a child  . Blood in stool   . Calculus of gallbladder without cholecystitis without obstruction 12/25/2015  . Crohn's disease ([place])   . Depression   . Exacerbation of Crohn's disease ([place]) 09/10/2017  . Gallbladder polyp 12/25/2015  . Gastroenteritis due to norovirus 05/03/2017  . Generalized abdominal pain   . H/O Clostridium difficile infection 05/03/2017  . Iron deficiency anemia 12/28/2016  . Iron deficiency anemia 12/28/2016  . Perirectal abscess 07/04/2015   Overview:  Added automatically from request for surgery [phone]  . Rectal fistula   . Sepsis ([place]) 06/22/2016    Patient Active Problem List   Diagnosis Date Noted  .  Crohn's disease of anal canal ([place]) 11/29/2018  . Other long term (current) drug therapy 11/14/2018  . Crohn's disease of both small and large intestine with other complication ([place]) 58/30/9407  . Iron deficiency anemia 12/28/2016  . Osteoporosis 12/09/2016  . Perirectal fistula   . Fracture of metacarpal bone 06/09/2016  . Intestinal bypass or anastomosis status   . Mild episode of recurrent major depressive disorder ([place]) 04/02/2015  . Asthma, mild intermittent 04/02/2015    Past Surgical History:  Procedure Laterality Date  . COLON RESECTION    . COLON SURGERY    . COLONOSCOPY N/A 11/30/2018   Procedure: COLONOSCOPY;  Surgeon: Lin Landsman, MD;  Location: [facility] ENDOSCOPY;  Service: Gastroenterology;  Laterality: N/A;  . COLONOSCOPY WITH PROPOFOL N/A 04/03/2015   Procedure: COLONOSCOPY WITH PROPOFOL;  Surgeon: Hulen Luster, MD;  Location[facility] ENDOSCOPY;  Service: Endoscopy;  Laterality: N/A;  . COLONOSCOPY WITH PROPOFOL N/A 12/22/2015   Procedure: COLONOSCOPY WITH PROPOFOL;  Surgeon: [name], MD;  Location: [place];  Service: Endoscopy;  Laterality: N/A;  . COLONOSCOPY WITH PROPOFOL N/A 12/22/2016   Procedure: COLONOSCOPY WITH PROPOFOL;  Surgeon: Lin Landsman, MD;  Location: [place];  Service: Endoscopy;  Laterality: N/A;  . COLONOSCOPY WITH PROPOFOL N/A 09/14/2017   Procedure: COLONOSCOPY WITH PROPOFOL;  Surgeon: Lin Landsman, MD;  Location: [facility] ENDOSCOPY;  Service: Gastroenterology;  Laterality: N/A;  . ESOPHAGOGASTRODUODENOSCOPY (EGD) WITH PROPOFOL  N/A 12/22/2016   Procedure: ESOPHAGOGASTRODUODENOSCOPY (EGD) WITH PROPOFOL;  Surgeon: Lin Landsman, MD;  Location: [place];  Service: Endoscopy;  Laterality: N/A;  . rectal fistula surgery    . WRIST SURGERY      Prior to Admission medications   Medication Sig Start Date End Date Taking? Authorizing Provider  ARIPiprazole (ABILIFY) 5 MG tablet Take 5 mg by mouth daily.  12/15/18   [provider]  escitalopram (LEXAPRO) 10 MG tablet Take 1 tablet (10 mg total) by mouth daily. 02/20/18   Ursula Alert, MD  HYDROcodone-acetaminophen (NORCO) 5-325 MG tablet Take 1 tablet by mouth every 6 (six) hours as needed for moderate pain. Patient not taking: Reported on 12/26/2018 12/01/18   [name], MD  hydrOXYzine (VISTARIL) 25 MG capsule Take 1 capsule (25 mg total) by mouth daily as needed (severe anxiety attacks). 07/15/17   Ursula Alert, MD  inFLIXimab (REMICADE) 100 MG injection Inject 100 mg into the vein every 8 (eight) weeks.    [provider]  mesalamine (ROWASA) 4 g enema Place 60 mLs (4 g total) rectally at bedtime for 14 days. 12/01/18 12/15/18  [name], MD  methotrexate (RHEUMATREX) 2.5 MG tablet TAKE 10 TABLETS (25 MG TOTAL) BY MOUTH ONCE A WEEK. CAUTION:CHEMOTHERAPY. PROTECT FROM LIGHT. 02/19/19 05/20/19  Lin Landsman, MD  oxyCODONE (OXY IR/ROXICODONE) 5 MG immediate release tablet oxycodone 5 mg tablet    [provider]  predniSONE (DELTASONE) 10 MG tablet Take 40 mg PO Daily X 2 weeks, then 30 mg Daily X 1 week, then 20 mg Daily X 1 week, then 10 mg Daily X 1 week and then STOP. 12/01/18   [name], MD    Allergies Patient has no known allergies.  Family History  Problem Relation Age of Onset  . Diabetes Paternal Grandfather   . Heart disease Paternal Grandfather   . Depression Mother   . Drug abuse Mother   . Osteoporosis Neg Hx     Social History Social History   Tobacco Use  . Smoking status: Never Smoker  . Smokeless tobacco: Former Network engineer Use Topics  . Alcohol use: Yes    Comment: once a month  . Drug use: No    Comment: hx of 3 year ago       Review of Systems Constitutional: No fever/chills Eyes: No visual changes. ENT: No sore throat. Cardiovascular: Denies chest pain. Respiratory: Denies shortness of breath. Gastrointestinal: Abdominal pain, diarrhea,  vomiting Genitourinary: Negative for dysuria. Musculoskeletal: Negative for back pain. Skin: Negative for rash. Neurological: Negative for headaches, focal weakness or numbness. All other ROS negative ____________________________________________   PHYSICAL EXAM:  VITAL SIGNS: ED Triage Vitals  Enc Vitals Group     BP 03/18/19 1052 116/80     Pulse Rate 03/18/19 1052 (!) 105     Resp 03/18/19 1052 18     Temp 03/18/19 1052 98.6 F (37 C)     Temp Source 03/18/19 1052 Oral     SpO2 03/18/19 1052 99 %     Weight 03/18/19 1050 140 lb (63.5 kg)     Height 03/18/19 1050 5' 5"  (1.651 m)     Head Circumference --      Peak Flow --      Pain Score 03/18/19 1049 5     Pain Loc --      Pain Edu? --      Excl. in [place]? --  Constitutional: Alert and oriented. Well appearing and in no acute distress. Eyes: Conjunctivae are normal. EOMI. Head: Atraumatic. Nose: No congestion/rhinnorhea. Mouth/Throat: Mucous membranes are moist.   Neck: No stridor. Trachea Midline. FROM Cardiovascular: Tachycardic, regular rhythm. Grossly normal heart sounds.  Good peripheral circulation. Respiratory: Normal respiratory effort.  No retractions. Lungs CTAB. Gastrointestinal: Soft and nontender. No distention. No abdominal bruits.  Musculoskeletal: No lower extremity tenderness nor edema.  No joint effusions. Neurologic:  Normal speech and language. No gross focal neurologic deficits are appreciated.  Skin:  Skin is warm, dry and intact. No rash noted. Psychiatric: Mood and affect are normal. Speech and behavior are normal. GU: Fistula noted without any erythema or active drainage.  Skin tag noted near his rectum.  No fluctuation or redness.  ____________________________________________   LABS (all labs ordered are listed, but only abnormal results are displayed)  Labs Reviewed  COMPREHENSIVE METABOLIC PANEL - Abnormal; Notable for the following components:      Result Value   Calcium 8.7 (*)     ALT 48 (*)    All other components within normal limits  CBC - Abnormal; Notable for the following components:   MCH 25.9 (*)    All other components within normal limits  GI PATHOGEN PANEL BY PCR, STOOL  C DIFFICILE QUICK SCREEN W PCR REFLEX  RESPIRATORY PANEL BY RT PCR (FLU A&B, COVID)  LIPASE, BLOOD  URINALYSIS, COMPLETE (UACMP) WITH MICROSCOPIC  CBC  CREATININE, SERUM   ____________________________________________  INITIAL IMPRESSION / ASSESSMENT AND PLAN / ED COURSE  Leroy Hernandez was evaluated in Emergency Department on 03/18/2019 for the symptoms described in the history of present illness. He was evaluated in the context of the global COVID-19 pandemic, which necessitated consideration that the patient might be at risk for infection with the SARS-CoV-2 virus that causes COVID-19. Institutional protocols and algorithms that pertain to the evaluation of patients at risk for COVID-19 are in a state of rapid change based on information released by regulatory bodies including the CDC and federal and state organizations. These policies and algorithms were followed during the patient's care in the ED.    Patient presents with what is most likely is a Crohn's flare.  I do not think he needs a repeat CT scan at this time given no peritonitis and I will suspicion for perforation.  Will get labs to evaluate for electrolyte abnormalities, AKI.  Will get C. difficile testing if he has a history of this.  Rectal exam does not show any evidence of abscess or thrombosed hemorrhoid.    We will give fluids, nausea meds and discussed with GI.  Labs are reassuring.   Discussed with Dr.  Vicente Males from [place].  Recommends getting a C. difficile and once negative can give 60 of Solu-Medrol.  Recommends admission given the amount of diarrhea and the bleeding.  ____________________________________________   FINAL CLINICAL IMPRESSION(S) / ED DIAGNOSES   Final diagnoses:  Dehydration  Crohn's disease of  colon with complication (HCC)      MEDICATIONS GIVEN DURING THIS VISIT:  Medications  sodium chloride flush (NS) 0.9 % injection 3 mL (has no administration in time range)  ARIPiprazole (ABILIFY) tablet 5 mg (has no administration in time range)  escitalopram (LEXAPRO) tablet 10 mg (has no administration in time range)  hydrOXYzine (VISTARIL) capsule 25 mg (has no administration in time range)  pantoprazole (PROTONIX) injection 40 mg (has no administration in time range)  iohexol (OMNIPAQUE) 9 MG/ML oral solution 500 mL (  500 mLs Oral Contrast Given 03/18/19 1246)  inFLIXimab (REMICADE) 100 mg in sodium chloride 0.9 % 250 mL infusion (has no administration in time range)  enoxaparin (LOVENOX) injection 40 mg (has no administration in time range)  lactated ringers infusion (has no administration in time range)  sodium chloride 0.9 % bolus 1,000 mL (1,000 mLs Intravenous New Bag/Given 03/18/19 1208)  ondansetron (ZOFRAN) injection 4 mg (4 mg Intravenous Given 03/18/19 1207)  acetaminophen (TYLENOL) tablet 1,000 mg (1,000 mg Oral Given 03/18/19 1208)     ED Discharge Orders    None       Note:  This document was prepared using Dragon voice recognition software and may include unintentional dictation errors.   Vanessa Heilwood, MD 03/18/19 1329

## 2019-03-18 NOTE — Assessment & Plan Note (Addendum)
Pt is continued on his regimen of methotrexate ,remicaide , and  mesalamine. Started soft diet after ct and/pelvis.  LR @ 100 x 24 hours.

## 2019-03-18 NOTE — Consult Note (Signed)
SURGICAL CONSULTATION NOTE   HISTORY OF PRESENT ILLNESS (HPI):  34 y.o. male presented to [facility] ED for evaluation of rectal pain since 2 weeks ago.  The patient reported the pain is in the pelvic area.  The pain does not radiate to other part of the body.  The patient reported that the pain is associated with some nausea and vomiting.  Patient reports that he has sees pain intermittently due to his Crohn's disease.  He denies any pain perianally.  He denies any drainage perianally.   Patient has history of Crohn disease with small and large bowel disease.  He has had 2 intra-abdominal surgeries in a span of 10 years.  Most of the time the patient is very well controlled with this intermittent pains and nausea and vomiting.  He had an episode of perianal abscess for years ago that needed incision and drainage at [facility].  Since then he has had intermittent drainage through the perirectal fistula.  Today he denies any issues with the fistula.  He denies any recent drainage or pain.  Surgery is consulted by Dr. Posey Pronto in this context for evaluation and management of perirectal fistula.  PAST MEDICAL HISTORY (PMH):  Past Medical History:  Diagnosis Date  . Anxiety   . Asthma    as a child  . Blood in stool   . Calculus of gallbladder without cholecystitis without obstruction 12/25/2015  . Crohn's disease ([place])   . Depression   . Exacerbation of Crohn's disease ([place]) 09/10/2017  . Gallbladder polyp 12/25/2015  . Gastroenteritis due to norovirus 05/03/2017  . Generalized abdominal pain   . H/O Clostridium difficile infection 05/03/2017  . Iron deficiency anemia 12/28/2016  . Iron deficiency anemia 12/28/2016  . Perirectal abscess 07/04/2015   Overview:  Added automatically from request for surgery [id number]  . Rectal fistula   . Sepsis ([place]) 06/22/2016     PAST SURGICAL HISTORY ([place]):  Past Surgical History:  Procedure Laterality Date  . COLON RESECTION    . COLON SURGERY    . COLONOSCOPY N/A  11/30/2018   Procedure: COLONOSCOPY;  Surgeon: Lin Landsman, MD;  Location: [facility] ENDOSCOPY;  Service: Gastroenterology;  Laterality: N/A;  . COLONOSCOPY WITH PROPOFOL N/A 04/03/2015   Procedure: COLONOSCOPY WITH PROPOFOL;  Surgeon: Hulen Luster, MD;  [facility];  Service: Endoscopy;  Laterality: N/A;  . COLONOSCOPY WITH PROPOFOL N/A 12/22/2015   Procedure: COLONOSCOPY WITH PROPOFOL;  Surgeon: [name], MD;  Location: [place];  Service: Endoscopy;  Laterality: N/A;  . COLONOSCOPY WITH PROPOFOL N/A 12/22/2016   Procedure: COLONOSCOPY WITH PROPOFOL;  Surgeon: Lin Landsman, MD;  Location: [place];  Service: Endoscopy;  Laterality: N/A;  . COLONOSCOPY WITH PROPOFOL N/A 09/14/2017   Procedure: COLONOSCOPY WITH PROPOFOL;  Surgeon: Lin Landsman, MD;  Location: [facility] ENDOSCOPY;  Service: Gastroenterology;  Laterality: N/A;  . ESOPHAGOGASTRODUODENOSCOPY (EGD) WITH PROPOFOL N/A 12/22/2016   Procedure: ESOPHAGOGASTRODUODENOSCOPY (EGD) WITH PROPOFOL;  Surgeon: Lin Landsman, MD;  Location: [place];  Service: Endoscopy;  Laterality: N/A;  . rectal fistula surgery    . WRIST SURGERY       MEDICATIONS:  Prior to Admission medications   Medication Sig Start Date End Date Taking? Authorizing Provider  ARIPiprazole (ABILIFY) 5 MG tablet Take 5 mg by mouth daily. 12/15/18  Yes [provider]  busPIRone (BUSPAR) 5 MG tablet Take 5 mg by mouth 2 (two) times daily. 02/14/19  Yes [provider]  escitalopram (  LEXAPRO) 10 MG tablet Take 1 tablet (10 mg total) by mouth daily. 02/20/18  Yes Eappen, Ria Clock, MD  inFLIXimab (REMICADE) 100 MG injection Inject 100 mg into the vein every 8 (eight) weeks.   Yes [provider]  methotrexate (RHEUMATREX) 2.5 MG tablet TAKE 10 TABLETS (25 MG TOTAL) BY MOUTH ONCE A WEEK. CAUTION:CHEMOTHERAPY. PROTECT FROM LIGHT. 02/19/19 05/20/19 Yes Vanga, Tally Due, MD  albuterol (VENTOLIN HFA)  108 (90 Base) MCG/ACT inhaler Inhale 2 puffs into the lungs every 6 (six) hours as needed for wheezing.    [provider]  hydrOXYzine (VISTARIL) 25 MG capsule Take 1 capsule (25 mg total) by mouth daily as needed (severe anxiety attacks). 07/15/17   Ursula Alert, MD     ALLERGIES:  No Known Allergies   SOCIAL HISTORY:  Social History   Socioeconomic History  . Marital status: Divorced    Spouse name: Not on file  . Number of children: 0  . Years of education: Not on file  . Highest education level: Associate degree: occupational, Hotel manager, or vocational program  Occupational History    Employer: [employer]  Tobacco Use  . Smoking status: Never Smoker  . Smokeless tobacco: Former Network engineer and Sexual Activity  . Alcohol use: Yes    Comment: once a month  . Drug use: No    Comment: hx of 3 year ago   . Sexual activity: Yes  Other Topics Concern  . Not on file  Social History Narrative  . Not on file   Social Determinants of Health   Financial Resource Strain:   . Difficulty of Paying Living Expenses: Not on file  Food Insecurity:   . Worried About Charity fundraiser in the Last Year: Not on file  . Ran Out of Food in the Last Year: Not on file  Transportation Needs:   . Lack of Transportation (Medical): Not on file  . Lack of Transportation (Non-Medical): Not on file  Physical Activity:   . Days of Exercise per Week: Not on file  . Minutes of Exercise per Session: Not on file  Stress:   . Feeling of Stress : Not on file  Social Connections:   . Frequency of Communication with Friends and Family: Not on file  . Frequency of Social Gatherings with Friends and Family: Not on file  . Attends Religious Services: Not on file  . Active Member of Clubs or Organizations: Not on file  . Attends Archivist Meetings: Not on file  . Marital Status: Not on file  Intimate Partner Violence:   . Fear of Current or Ex-Partner: Not on file  .  Emotionally Abused: Not on file  . Physically Abused: Not on file  . Sexually Abused: Not on file    The patient currently resides (home / rehab facility / nursing home): Home The patient normally is (ambulatory / bedbound): Ambulatory   FAMILY HISTORY:  Family History  Problem Relation Age of Onset  . Diabetes Paternal Grandfather   . Heart disease Paternal Grandfather   . Depression Mother   . Drug abuse Mother   . Osteoporosis Neg Hx      REVIEW OF SYSTEMS:  Constitutional: denies weight loss, fever, chills, or sweats  Eyes: denies any other vision changes, history of eye injury  ENT: denies sore throat, hearing problems  Respiratory: denies shortness of breath, wheezing  Cardiovascular: denies chest pain, palpitations  Gastrointestinal: denies abdominal pain, positive for rectal  pain, nausea and vomiting Genitourinary: denies burning with urination or urinary frequency Musculoskeletal: denies any other joint pains or cramps  Skin: denies any other rashes or skin discolorations  Neurological: denies any other headache, dizziness, weakness  Psychiatric: denies any other depression, anxiety   All other review of systems were negative   VITAL SIGNS:  Temp:  [98.3 F (36.8 C)-98.6 F (37 C)] 98.3 F (36.8 C) (02/07 1500) Pulse Rate:  [79-105] 79 (02/07 1500) Resp:  [16-18] 16 (02/07 1500) BP: (108-122)/(70-80) 122/76 (02/07 1500) SpO2:  [99 %-100 %] 100 % (02/07 1500) Weight:  [63.5 kg] 63.5 kg (02/07 1050)     Height: 5' 5"  (165.1 cm) Weight: 63.5 kg BMI (Calculated): 23.3   INTAKE/OUTPUT:  This shift: No intake/output data recorded.  Last 2 shifts: @IOLAST2SHIFTS @   PHYSICAL EXAM:  Constitutional:  -- Normal body habitus  -- Awake, alert, and oriented x3  Eyes:  -- Pupils equally round and reactive to light  -- No scleral icterus  Ear, nose, and throat:  -- No jugular venous distension  Pulmonary:  -- No crackles  -- Equal breath sounds bilaterally --  Breathing non-labored at rest Cardiovascular:  -- S1, S2 present  -- No pericardial rubs Gastrointestinal:  -- Abdomen soft, nontender, non-distended, no guarding or rebound tenderness -- No abdominal masses appreciated, pulsatile or otherwise --Rectal exam shows chronic fistula on the right side.  There is a left lateral external hemorrhoid.  Both of them are not inflamed.  There is no drainage.  There is no erythema.  There is no cellulitis.  There is no fluctuant collection.  Rectal exam was nontender.  Adequate rectal tone Musculoskeletal and Integumentary:  -- Wounds or skin discoloration: None appreciated -- Extremities: B/L UE and LE FROM, hands and feet warm, no edema  Neurologic:  -- Motor function: intact and symmetric -- Sensation: intact and symmetric   Labs:  CBC Latest Ref Rng & Units 03/18/2019 11/30/2018 11/29/2018  WBC 4.0 - 10.5 K/uL 7.3 7.6 9.5  Hemoglobin 13.0 - 17.0 g/dL 13.8 14.1 13.5  Hematocrit 39.0 - 52.0 % 42.7 43.8 40.8  Platelets 150 - 400 K/uL 295 308 280   CMP Latest Ref Rng & Units 03/18/2019 11/30/2018 11/29/2018  Glucose 70 - 99 mg/dL 97 - 95  BUN 6 - 20 mg/dL 8 - 7  Creatinine 0.61 - 1.24 mg/dL 0.88 - 0.97  Sodium 135 - 145 mmol/L 139 - 140  Potassium 3.5 - 5.1 mmol/L 3.5 4.0 3.0(L)  Chloride 98 - 111 mmol/L 105 - 102  CO2 22 - 32 mmol/L 29 - 26  Calcium 8.9 - 10.3 mg/dL 8.7(L) - 8.5(L)  Total Protein 6.5 - 8.1 g/dL 7.4 6.8 7.2  Total Bilirubin 0.3 - 1.2 mg/dL 0.8 1.0 1.0  Alkaline Phos 38 - 126 U/L 77 74 81  AST 15 - 41 U/L 33 15 16  ALT 0 - 44 U/L 48(H) 14 15    Imaging studies:  I personally evaluated the CT scan that shows a right perirectal fistula.  The perirectal fistula is very high riding.  There is no abscess associated to the fistula.  There is no surgical perirectal pathology at this moment.  I identified the area of chronic inflammation due to Crohn's disease on the sigmoid area.  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  Patient admitted due to exacerbation of Crohn's disease with rectal pain, nausea and vomiting.  Surgery was consulted due to  concern of perirectal fistula.  At this moment the fistula without any sign of infection, no abscess.  This fistula is chronic and intermittently draining.  There is no indication for any acute management of perirectal fistula.  I discussed with the patient that the treatment of this type of fistula is very complex.  My recommendation is to be evaluated by a colorectal surgeon as outpatient in case he develop any indication for surgery in the future.  The best surgical management for this fistula require advanced procedure by a specialized colorectal surgeon.  No contraindication for medical management for his Crohn disease.  No surgical management indicated at this moment.  Arnold Long, MD

## 2019-03-18 NOTE — Assessment & Plan Note (Signed)
Pt is continued on his abilify, lexapro and vistaril.

## 2019-03-19 ENCOUNTER — Encounter: Payer: Self-pay | Admitting: Gastroenterology

## 2019-03-19 DIAGNOSIS — F330 Major depressive disorder, recurrent, mild: Secondary | ICD-10-CM | POA: Diagnosis present

## 2019-03-19 DIAGNOSIS — E860 Dehydration: Secondary | ICD-10-CM

## 2019-03-19 DIAGNOSIS — F419 Anxiety disorder, unspecified: Secondary | ICD-10-CM | POA: Diagnosis present

## 2019-03-19 DIAGNOSIS — K50813 Crohn's disease of both small and large intestine with fistula: Secondary | ICD-10-CM | POA: Diagnosis present

## 2019-03-19 DIAGNOSIS — D509 Iron deficiency anemia, unspecified: Secondary | ICD-10-CM | POA: Diagnosis present

## 2019-03-19 DIAGNOSIS — Z7952 Long term (current) use of systemic steroids: Secondary | ICD-10-CM | POA: Diagnosis not present

## 2019-03-19 DIAGNOSIS — K50818 Crohn's disease of both small and large intestine with other complication: Secondary | ICD-10-CM | POA: Diagnosis not present

## 2019-03-19 DIAGNOSIS — Z813 Family history of other psychoactive substance abuse and dependence: Secondary | ICD-10-CM | POA: Diagnosis not present

## 2019-03-19 DIAGNOSIS — Z20822 Contact with and (suspected) exposure to covid-19: Secondary | ICD-10-CM | POA: Diagnosis present

## 2019-03-19 DIAGNOSIS — K50811 Crohn's disease of both small and large intestine with rectal bleeding: Secondary | ICD-10-CM | POA: Diagnosis present

## 2019-03-19 DIAGNOSIS — Z9049 Acquired absence of other specified parts of digestive tract: Secondary | ICD-10-CM | POA: Diagnosis not present

## 2019-03-19 DIAGNOSIS — J4520 Mild intermittent asthma, uncomplicated: Secondary | ICD-10-CM

## 2019-03-19 DIAGNOSIS — K50119 Crohn's disease of large intestine with unspecified complications: Secondary | ICD-10-CM | POA: Diagnosis not present

## 2019-03-19 DIAGNOSIS — K644 Residual hemorrhoidal skin tags: Secondary | ICD-10-CM | POA: Diagnosis present

## 2019-03-19 DIAGNOSIS — K605 Anorectal fistula: Secondary | ICD-10-CM | POA: Diagnosis present

## 2019-03-19 DIAGNOSIS — Z833 Family history of diabetes mellitus: Secondary | ICD-10-CM | POA: Diagnosis not present

## 2019-03-19 DIAGNOSIS — Z8249 Family history of ischemic heart disease and other diseases of the circulatory system: Secondary | ICD-10-CM | POA: Diagnosis not present

## 2019-03-19 DIAGNOSIS — Z818 Family history of other mental and behavioral disorders: Secondary | ICD-10-CM | POA: Diagnosis not present

## 2019-03-19 DIAGNOSIS — Z79899 Other long term (current) drug therapy: Secondary | ICD-10-CM | POA: Diagnosis not present

## 2019-03-19 DIAGNOSIS — K604 Rectal fistula: Secondary | ICD-10-CM | POA: Diagnosis not present

## 2019-03-19 LAB — C DIFFICILE QUICK SCREEN W PCR REFLEX
C Diff antigen: NEGATIVE
C Diff interpretation: NOT DETECTED
C Diff toxin: NEGATIVE

## 2019-03-19 MED ORDER — SODIUM CHLORIDE 0.9 % IV SOLN: INTRAVENOUS | Status: DC

## 2019-03-19 MED ORDER — METHYLPREDNISOLONE SODIUM SUCC 125 MG IJ SOLR
60.0000 mg | Freq: Three times a day (TID) | INTRAMUSCULAR | Status: AC
  Administered 2019-03-19: 60 mg via INTRAVENOUS
  Filled 2019-03-19: qty 2

## 2019-03-19 MED ORDER — ENSURE ENLIVE PO LIQD
237.0000 mL | Freq: Three times a day (TID) | ORAL | Status: DC
  Administered 2019-03-19 – 2019-03-20 (×4): 237 mL via ORAL

## 2019-03-19 MED ORDER — ADULT MULTIVITAMIN W/MINERALS CH
1.0000 | ORAL_TABLET | Freq: Every day | ORAL | Status: DC
  Administered 2019-03-20: 1 via ORAL
  Filled 2019-03-19: qty 1

## 2019-03-19 NOTE — Consult Note (Signed)
Leroy Lame, MD [facility]  [address] [place], [place] [address] Phone: [phone] Fax : [phone]  Consultation  Referring Provider:     Dr. Posey Pronto Primary Care Physician:  [name], MD Primary Gastroenterologist:  Dr. [name]         Reason for Consultation:     Crohn's flare  Date of Admission:  03/18/2019 Date of Consultation:  03/19/2019         HPI:   Leroy Hernandez is a 34 y.o. male who has seen me in the past but most recently has followed up with Dr. [name] for his Crohn's disease. The patient has had his disease complicated by perianal fistula disease with a bowel resection who was admitted with rectal pain and rectal bleeding. The patient's symptoms have been going on for the last week or so. The patient has been treated with Remicade and methotrexate. The patient states that he has not had any missed doses of his Remicade but sometimes is a few days late with his methotrexate when he forgets. The patient also appears to have missed some outpatient follow-ups in the past. The patient had his C. difficile checked on admission that was negative and a GI panel is pending. The patient was tested in the ER and was found to be Covid negative. The patient's white cell count yesterday was 7.3 with a hemoglobin of 13.8 and a hematocrit of 42.7. The patient's liver enzymes showed his ALT to be slightly elevated at 48 with the rest of the liver enzymes being normal. The patient's most recent colonoscopy was 11/30/2018 with active inflammation seen in multiple areas. The patient has been started on Cipro and Flagyl in addition to Solu-Medrol. He reports that he is feeling better today than he was yesterday. He states he usually has 7 bloody bowel movement a day recently but has only had 2 so far by the time I saw him.  Past Medical History:  Diagnosis Date  . Anxiety   . Asthma    as a child  . Blood in stool   . Calculus of gallbladder without cholecystitis without  obstruction 12/25/2015  . Crohn's disease ([place])   . Depression   . Exacerbation of Crohn's disease ([place]) 09/10/2017  . Gallbladder polyp 12/25/2015  . Gastroenteritis due to norovirus 05/03/2017  . Generalized abdominal pain   . H/O Clostridium difficile infection 05/03/2017  . Iron deficiency anemia 12/28/2016  . Iron deficiency anemia 12/28/2016  . Perirectal abscess 07/04/2015   Overview:  Added automatically from request for surgery [id number]  . Rectal fistula   . Sepsis ([place]) 06/22/2016    Past Surgical History:  Procedure Laterality Date  . COLON RESECTION    . COLON SURGERY    . COLONOSCOPY N/A 11/30/2018   Procedure: COLONOSCOPY;  Surgeon: Lin Landsman, MD;  Location: [facility] ENDOSCOPY;  Service: Gastroenterology;  Laterality: N/A;  . COLONOSCOPY WITH PROPOFOL N/A 04/03/2015   Procedure: COLONOSCOPY WITH PROPOFOL;  Surgeon: Hulen Luster, MD;  Location[facility] ENDOSCOPY;  Service: Endoscopy;  Laterality: N/A;  . COLONOSCOPY WITH PROPOFOL N/A 12/22/2015   Procedure: COLONOSCOPY WITH PROPOFOL;  Surgeon: Leroy Lame, MD;  Location: [place];  Service: Endoscopy;  Laterality: N/A;  . COLONOSCOPY WITH PROPOFOL N/A 12/22/2016   Procedure: COLONOSCOPY WITH PROPOFOL;  Surgeon: Lin Landsman, MD;  Location: [place];  Service: Endoscopy;  Laterality: N/A;  . COLONOSCOPY WITH PROPOFOL N/A 09/14/2017   Procedure: COLONOSCOPY WITH PROPOFOL;  Surgeon: Lin Landsman, MD;  Location: [facility] ENDOSCOPY;  Service: Gastroenterology;  Laterality: N/A;  . ESOPHAGOGASTRODUODENOSCOPY (EGD) WITH PROPOFOL N/A 12/22/2016   Procedure: ESOPHAGOGASTRODUODENOSCOPY (EGD) WITH PROPOFOL;  Surgeon: Lin Landsman, MD;  Location: [place];  Service: Endoscopy;  Laterality: N/A;  . rectal fistula surgery    . WRIST SURGERY      Prior to Admission medications   Medication Sig Start Date End Date Taking? Authorizing Provider  ARIPiprazole (ABILIFY) 5 MG tablet Take 5 mg by  mouth daily. 12/15/18  Yes [provider]  busPIRone (BUSPAR) 5 MG tablet Take 5 mg by mouth 2 (two) times daily. 02/14/19  Yes [provider]  escitalopram (LEXAPRO) 10 MG tablet Take 1 tablet (10 mg total) by mouth daily. 02/20/18  Yes Eappen, Ria Clock, MD  inFLIXimab (REMICADE) 100 MG injection Inject 100 mg into the vein every 8 (eight) weeks.   Yes [provider]  methotrexate (RHEUMATREX) 2.5 MG tablet TAKE 10 TABLETS (25 MG TOTAL) BY MOUTH ONCE A WEEK. CAUTION:CHEMOTHERAPY. PROTECT FROM LIGHT. 02/19/19 05/20/19 Yes Vanga, Tally Due, MD  albuterol (VENTOLIN HFA) 108 (90 Base) MCG/ACT inhaler Inhale 2 puffs into the lungs every 6 (six) hours as needed for wheezing.    [provider]  hydrOXYzine (VISTARIL) 25 MG capsule Take 1 capsule (25 mg total) by mouth daily as needed (severe anxiety attacks). 07/15/17   Ursula Alert, MD    Family History  Problem Relation Age of Onset  . Diabetes Paternal Grandfather   . Heart disease Paternal Grandfather   . Depression Mother   . Drug abuse Mother   . Osteoporosis Neg Hx      Social History   Tobacco Use  . Smoking status: Never Smoker  . Smokeless tobacco: Former Network engineer Use Topics  . Alcohol use: Yes    Comment: once a month  . Drug use: No    Comment: hx of 3 year ago     Allergies as of 03/18/2019  . (No Known Allergies)    Review of Systems:    All systems reviewed and negative except where noted in HPI.   Physical Exam:  Vital signs in last 24 hours: Temp:  [97.4 F (36.3 C)-98.6 F (37 C)] 97.6 F (36.4 C) (02/08 1515) Pulse Rate:  [75-98] 83 (02/08 1515) Resp:  [17-20] 18 (02/08 1515) BP: (100-134)/(59-76) 134/76 (02/08 1515) SpO2:  [96 %-99 %] 99 % (02/08 1515) Weight:  [64 kg] 64 kg (02/08 0344) Last BM Date: 03/19/19 General:   Pleasant, cooperative in NAD Head:  Normocephalic and atraumatic. Eyes:   No icterus.   Conjunctiva pink. PERRLA. Ears:  Normal auditory  acuity. Neck:  Supple; no masses or thyroidomegaly Lungs: Respirations even and unlabored. Lungs clear to auscultation bilaterally.   No wheezes, crackles, or rhonchi.  Heart:  Regular rate and rhythm;  Without murmur, clicks, rubs or gallops Abdomen:  Soft, nondistended, nontender. Normal bowel sounds. No appreciable masses or hepatomegaly.  No rebound or guarding.  Rectal:  Not performed. Msk:  Symmetrical without gross deformities.    Extremities:  Without edema, cyanosis or clubbing. Neurologic:  Alert and oriented x3;  grossly normal neurologically. Skin:  Intact without significant lesions or rashes. Cervical Nodes:  No significant cervical adenopathy. Psych:  Alert and cooperative. Normal affect.  LAB RESULTS: Recent Labs    03/18/19 1053  WBC 7.3  HGB 13.8  HCT 42.7  PLT 295   BMET Recent Labs  03/18/19 1053  NA 139  K 3.5  CL 105  CO2 29  GLUCOSE 97  BUN 8  CREATININE 0.88  CALCIUM 8.7*   LFT Recent Labs    03/18/19 1053  PROT 7.4  ALBUMIN 3.8  AST 33  ALT 48*  ALKPHOS 77  BILITOT 0.8   PT/INR No results for input(s): LABPROT, INR in the last 72 hours.  STUDIES: CT ABDOMEN PELVIS W CONTRAST  Result Date: 03/18/2019 CLINICAL DATA:  Crohn disease suspected perirectal pain for 2 weeks. Decreased appetite. Vomiting. Intermittent bloody stools. EXAM: CT ABDOMEN AND PELVIS WITH CONTRAST TECHNIQUE: Multidetector CT imaging of the abdomen and pelvis was performed using the standard protocol following bolus administration of intravenous contrast. CONTRAST:  139m OMNIPAQUE IOHEXOL 300 MG/ML  SOLN COMPARISON:  09/10/2017 FINDINGS: Lower chest: Unremarkable Hepatobiliary: No suspicious focal abnormality within the liver parenchyma. Tiny calcified gallstones noted. No intrahepatic or extrahepatic biliary dilation. Pancreas: No focal mass lesion. No dilatation of the main duct. No intraparenchymal cyst. No peripancreatic edema. Spleen: No splenomegaly. No focal mass  lesion. Adrenals/Urinary Tract: No adrenal nodule or mass. Stable 7 mm hypodensity in the interpolar right kidney, likely a cyst. Left kidney unremarkable. No evidence for hydroureter. The urinary bladder appears normal for the degree of distention. Stomach/Bowel: Stomach is unremarkable. No gastric wall thickening. No evidence of outlet obstruction. Duodenum is normally positioned as is the ligament of Treitz. No small bowel wall thickening. No small bowel dilatation. Right hemicolectomy with ileocolic anastomosis at the mid transverse colon. There is some wall thickening in the neo terminal ileum and blind stump of the colon at the level of the anastomosis with subtle adjacent edema. Mild lymphadenopathy is noted in the adjacent mesentery. Imaging features are similar to the prior study of 09/10/2017. There is wall thickening in the distal sigmoid colon and rectum with perirectal and sigmoid mesocolon lymphadenopathy. Similar appearance of the right perirectal fistula. Vascular/Lymphatic: No abdominal aortic aneurysm. No retroperitoneal lymphadenopathy. No pelvic sidewall lymphadenopathy. Reproductive: The prostate gland and seminal vesicles are unremarkable. Other: No intraperitoneal free fluid. No evidence for intraperitoneal abscess. Musculoskeletal: No worrisome lytic or sclerotic osseous abnormality. IMPRESSION: 1. Patient is status post right hemicolectomy with similar appearance of wall thickening in the neo terminal ileum and colon at the anastomosis. This is associated with subtle edema/inflammation in the region the anastomosis and mild lymphadenopathy in the adjacent mesentery. No evidence for perforation or abscess. 2. Similar appearance of wall thickening in the distal sigmoid colon and rectum with mild perirectal lymphadenopathy also unchanged. 3. Right perirectal fistula, similar to prior. No evidence for an associated abscess. 4. Cholelithiasis. Electronically Signed   By: EMisty StanleyM.D.   On:  03/18/2019 14:51      Impression / Plan:   Assessment: Principal Problem:   Crohn's disease of both small and large intestine with other complication ([place] Active Problems:   Mild episode of recurrent major depressive disorder (HCC)   Asthma, mild intermittent   Perirectal fistula   Crohn's disease ([place]   Crohn disease ([place]   TAcy OrsakFields is a 34y.o. y/o male with who comes with a history of Crohn's disease with a Crohn's flare. The patient had his stool sent off for C. difficile and a GI panel. The C. difficile was negative and the GI panel is pending. The patient was seen by surgery for his perianal fistulas and was thought not to have any acute irritation or inflammation/abscess of the fistulous. The patient was  also started on steroids. In October the patient's infliximab level was 22 with negative antibodies.  Plan:  The patient is having a flare of his Crohn's disease and has started on steroids and is also on antibiotics. The patient's C. difficile was negative and we are awaiting the GI panel. The patient has been evaluated by surgery for his perirectal fistula and they do not believe that the issue is in need of any urgent treatment at this time but have recommended the patient follow-up with a colorectal surgeon. The patient is doing better today and I would continue the current treatment. The patient has been explained the plan and agrees with it.  Thank you for involving me in the care of this patient.      LOS: 0 days   Leroy Lame, MD  03/19/2019, 5:54 PM Pager [phone] 7am-5pm  Check AMION for 5pm -7am coverage and on weekends   Note: This dictation was prepared with Dragon dictation along with smaller phrase technology. Any transcriptional errors that result from this process are unintentional.

## 2019-03-19 NOTE — Progress Notes (Signed)
5 loose stools today- all having visible blood present.

## 2019-03-19 NOTE — Progress Notes (Signed)
PROGRESS NOTE                                                                                                                                                                                                             Patient Demographics:    Leroy Hernandez, is a 34 y.o. male, DOB - [birth year], VXB:[id number]  Admit date - 03/18/2019   Admitting Physician Louellen Molder, MD  Outpatient Primary MD for the patient is [name], MD  LOS - 0  Outpatient Specialists: Dr. [name] (GI)  Chief Complaint  Patient presents with  . Emesis  . Rectal Pain       Brief Narrative 34 year old male with history of Crohn's disease s/p right hemicolectomy on Remicade as outpatient (follows with Dr. [name]) presented with bright red blood per rectum, severe perirectal pain with poor appetite and vomiting for past 2 weeks.  He was hospitalized in October 2020 for Crohn's flareup.  Has chronic perianal fistula and skin tag.  In the ED CT of the abdomen pelvis showed chronic wall thickening of the terminal ileum and colon at the anastomosis with subtle edema and inflammation and mild lymphadenopathy.  No perforation or abscess.  Right perirectal fistula noted unchanged from prior. Admitted for acute flareup of Crohn's disease and started on IV antibiotics and steroids along with IV fluids.   Subjective:   Patient reported having episode of nonbloody diarrhea this morning.  Still having ongoing perirectal pain.   Assessment  & Plan :    Principal Problem:   Crohn's disease of both small and large intestine with other complication ([place]) Continue IV fluids.  Stool for C. difficile negative..  Continue empiric IV Cipro and Flagyl.  Pain control with IV Dilaudid.  Surgery consulted to eval for perirectal fistula and recommended this seems unchanged from prior and no surgical intervention needed. GI consulted for further  recommendation. Continue soft diet and advance as tolerated.  Active Problems:   Mild episode of recurrent major depressive disorder ([place]) Denies worsening symptoms or suicidal ideation.  Continue Lexapro and Abilify.  Dehydration Continue IV fluids.  Code Status : Full code  Family Communication  : None  Disposition Plan  : Home possibly next 24-48 hours if abdominal pain (perirectal pain) and diarrhea improved and tolerating advance diet.    Barriers For Discharge : Ongoing  GI symptoms.  Patient status: Inpatient Patient presenting with acute Crohn's flareup with ongoing perirectal pain, poor p.o. intake and dehydration requiring frequent IV pain medications, IV antibiotics, IV steroid and IV fluids.  He needs to be monitored in an inpatient setting for at least greater than 2 midnights given high risk for further worsening of his Crohn's colitis including severe sepsis with septic shock, toxic megacolon and colonic perforation.  Consults  : GI, surgery  Procedures  : CT abdomen pelvis  DVT Prophylaxis  :  Lovenox -  Lab Results  Component Value Date   PLT 295 03/18/2019    Antibiotics  :    Anti-infectives (From admission, onward)   Start     Dose/Rate Route Frequency Ordered Stop   03/18/19 1800  metroNIDAZOLE (FLAGYL) IVPB 500 mg     500 mg 100 mL/hr over 60 Minutes Intravenous Every 8 hours 03/18/19 1653     03/18/19 1700  ciprofloxacin (CIPRO) IVPB 400 mg     400 mg 200 mL/hr over 60 Minutes Intravenous Every 12 hours 03/18/19 1653          Objective:   Vitals:   03/18/19 1928 03/19/19 0344 03/19/19 0802 03/19/19 1106  BP:  104/61 100/64 (!) 101/59  Pulse:  98 76 75  Resp:  20 17 17   Temp: (!) 97.4 F (36.3 C) 97.6 F (36.4 C) 98.6 F (37 C) 97.6 F (36.4 C)  TempSrc: Oral Oral Oral Oral  SpO2:  96% 96% 96%  Weight:  64 kg    Height:        Wt Readings from Last 3 Encounters:  03/19/19 64 kg  12/26/18 65.8 kg  11/29/18 68 kg      Intake/Output Summary (Last 24 hours) at 03/19/2019 1435 Last data filed at 03/19/2019 0303 Gross per 24 hour  Intake 1352.58 ml  Output 2 ml  Net 1350.58 ml     Physical Exam  Gen: not in distress, fatigue HEENT: Dry mucosa, supple neck Chest: clear b/l, no added sounds CVS: N S1&S2, no murmurs,  GI: soft, NT, ND, BS+ Musculoskeletal: warm, no edema     Data Review:    CBC Recent Labs  Lab 03/18/19 1053  WBC 7.3  HGB 13.8  HCT 42.7  PLT 295  MCV 80.1  MCH 25.9*  MCHC 32.3  RDW 12.6    Chemistries  Recent Labs  Lab 03/18/19 1053  NA 139  K 3.5  CL 105  CO2 29  GLUCOSE 97  BUN 8  CREATININE 0.88  CALCIUM 8.7*  AST 33  ALT 48*  ALKPHOS 77  BILITOT 0.8   ------------------------------------------------------------------------------------------------------------------ No results for input(s): CHOL, HDL, LDLCALC, TRIG, CHOLHDL, LDLDIRECT in the last 72 hours.  Lab Results  Component Value Date   HGBA1C 5.0 03/22/2017   ------------------------------------------------------------------------------------------------------------------ No results for input(s): TSH, T4TOTAL, T3FREE, THYROIDAB in the last 72 hours.  Invalid input(s): FREET3 ------------------------------------------------------------------------------------------------------------------ No results for input(s): VITAMINB12, FOLATE, FERRITIN, TIBC, IRON, RETICCTPCT in the last 72 hours.  Coagulation profile No results for input(s): INR, PROTIME in the last 168 hours.  No results for input(s): DDIMER in the last 72 hours.  Cardiac Enzymes No results for input(s): CKMB, TROPONINI, MYOGLOBIN in the last 168 hours.  Invalid input(s): CK ------------------------------------------------------------------------------------------------------------------ No results found for: BNP  Inpatient Medications  Scheduled Meds: . ARIPiprazole  5 mg Oral Daily  . enoxaparin (LOVENOX) injection   40 mg Subcutaneous Q24H  . escitalopram  10 mg Oral Daily  .  feeding supplement (ENSURE ENLIVE)  237 mL Oral TID BM  . [START ON 03/20/2019] multivitamin with minerals  1 tablet Oral Daily  . pantoprazole (PROTONIX) IV  40 mg Intravenous Q12H   Continuous Infusions: . ciprofloxacin 400 mg (03/19/19 0524)  . metronidazole 500 mg (03/19/19 1114)   PRN Meds:.HYDROmorphone (DILAUDID) injection, hydrOXYzine  Micro Results Recent Results (from the past 240 hour(s))  C Difficile Quick Screen w PCR reflex     Status: None   Collection Time: 03/18/19 11:41 AM   Specimen: Stool  Result Value Ref Range Status   C Diff antigen NEGATIVE NEGATIVE Final   C Diff toxin NEGATIVE NEGATIVE Final   C Diff interpretation No C. difficile detected.  Final    Comment: Performed at [facility], [address] [place], [place] [address]  Respiratory Panel by RT PCR (Flu A&B, Covid) -     Status: None   Collection Time: 03/18/19  1:27 PM  Result Value Ref Range Status   SARS Coronavirus 2 by RT PCR NEGATIVE NEGATIVE Final    Comment: (NOTE) SARS-CoV-2 target nucleic acids are NOT DETECTED. The SARS-CoV-2 RNA is generally detectable in upper respiratoy specimens during the acute phase of infection. The lowest concentration of SARS-CoV-2 viral copies this assay can detect is 131 copies/mL. A negative result does not preclude SARS-Cov-2 infection and should not be used as the sole basis for treatment or other patient management decisions. A negative result may occur with  improper specimen collection/handling, submission of specimen other than nasopharyngeal swab, presence of viral mutation(s) within the areas targeted by this assay, and inadequate number of viral copies (<131 copies/mL). A negative result must be combined with clinical observations, patient history, and epidemiological information. The expected result is Negative. Fact Sheet for Patients:   PinkCheek.be Fact Sheet for Healthcare Providers:  GravelBags.it This test is not yet ap proved or cleared by the [place] FDA and  has been authorized for detection and/or diagnosis of SARS-CoV-2 by FDA under an Emergency Use Authorization (EUA). This EUA will remain  in effect (meaning this test can be used) for the duration of the COVID-19 declaration under Section 564(b)(1) of the Act, 21 U.S.C. section 360bbb-3(b)(1), unless the authorization is terminated or revoked sooner.    Influenza A by PCR NEGATIVE NEGATIVE Final   Influenza B by PCR NEGATIVE NEGATIVE Final    Comment: (NOTE) The Xpert Xpress SARS-CoV-2/FLU/RSV assay is intended as an aid in  the diagnosis of influenza from Nasopharyngeal swab specimens and  should not be used as a sole basis for treatment. Nasal washings and  aspirates are unacceptable for Xpert Xpress SARS-CoV-2/FLU/RSV  testing. Fact Sheet for Patients: PinkCheek.be Fact Sheet for Healthcare Providers: GravelBags.it This test is not yet approved or cleared by the [place] FDA and  has been authorized for detection and/or diagnosis of SARS-CoV-2 by  FDA under an Emergency Use Authorization (EUA). This EUA will remain  in effect (meaning this test can be used) for the duration of the  Covid-19 declaration under Section 564(b)(1) of the Act, 21  U.S.C. section 360bbb-3(b)(1), unless the authorization is  terminated or revoked. Performed at [facility], [address] [place], [place] [address]     Radiology Reports CT ABDOMEN PELVIS W CONTRAST  Result Date: 03/18/2019 CLINICAL DATA:  Crohn disease suspected perirectal pain for 2 weeks. Decreased appetite. Vomiting. Intermittent bloody stools. EXAM: CT ABDOMEN AND PELVIS WITH CONTRAST TECHNIQUE: Multidetector CT imaging of the abdomen and  pelvis was performed using  the standard protocol following bolus administration of intravenous contrast. CONTRAST:  118m OMNIPAQUE IOHEXOL 300 MG/ML  SOLN COMPARISON:  09/10/2017 FINDINGS: Lower chest: Unremarkable Hepatobiliary: No suspicious focal abnormality within the liver parenchyma. Tiny calcified gallstones noted. No intrahepatic or extrahepatic biliary dilation. Pancreas: No focal mass lesion. No dilatation of the main duct. No intraparenchymal cyst. No peripancreatic edema. Spleen: No splenomegaly. No focal mass lesion. Adrenals/Urinary Tract: No adrenal nodule or mass. Stable 7 mm hypodensity in the interpolar right kidney, likely a cyst. Left kidney unremarkable. No evidence for hydroureter. The urinary bladder appears normal for the degree of distention. Stomach/Bowel: Stomach is unremarkable. No gastric wall thickening. No evidence of outlet obstruction. Duodenum is normally positioned as is the ligament of Treitz. No small bowel wall thickening. No small bowel dilatation. Right hemicolectomy with ileocolic anastomosis at the mid transverse colon. There is some wall thickening in the neo terminal ileum and blind stump of the colon at the level of the anastomosis with subtle adjacent edema. Mild lymphadenopathy is noted in the adjacent mesentery. Imaging features are similar to the prior study of 09/10/2017. There is wall thickening in the distal sigmoid colon and rectum with perirectal and sigmoid mesocolon lymphadenopathy. Similar appearance of the right perirectal fistula. Vascular/Lymphatic: No abdominal aortic aneurysm. No retroperitoneal lymphadenopathy. No pelvic sidewall lymphadenopathy. Reproductive: The prostate gland and seminal vesicles are unremarkable. Other: No intraperitoneal free fluid. No evidence for intraperitoneal abscess. Musculoskeletal: No worrisome lytic or sclerotic osseous abnormality. IMPRESSION: 1. Patient is status post right hemicolectomy with similar appearance of wall thickening in the neo  terminal ileum and colon at the anastomosis. This is associated with subtle edema/inflammation in the region the anastomosis and mild lymphadenopathy in the adjacent mesentery. No evidence for perforation or abscess. 2. Similar appearance of wall thickening in the distal sigmoid colon and rectum with mild perirectal lymphadenopathy also unchanged. 3. Right perirectal fistula, similar to prior. No evidence for an associated abscess. 4. Cholelithiasis. Electronically Signed   By: EMisty StanleyM.D.   On: 03/18/2019 14:51    Time Spent in minutes  25   Raychelle Hudman M.D on 03/19/2019 at 2:35 PM  Between 7am to 7pm - Pager - [phone] After 7pm go to [email or web address] - password [email or web address] -  Office  [phone]

## 2019-03-19 NOTE — Progress Notes (Signed)
Initial Nutrition Assessment  DOCUMENTATION CODES:   Not applicable  INTERVENTION:   Ensure Enlive po TID, each supplement provides 350 kcal and 20 grams of protein  MVI daily   NUTRITION DIAGNOSIS:   Inadequate oral intake related to acute illness as evidenced by per patient/family report.  GOAL:   Patient will meet greater than or equal to 90% of their needs  MONITOR:   PO intake, Supplement acceptance, Labs, Weight trends, Skin, I & O's  REASON FOR ASSESSMENT:   Malnutrition Screening Tool    ASSESSMENT:   34 y.o. male with perirectal fistula due to Crohn's disease  RD working remotely.  Spoke with patient via phone. Pt reports a h/o Crohn's disease diagnosed at age 51. Pt reports that he has flares at least 2 times per year. Pt reports that during his flares, he tries to avoid spicy foods and foods that are hard to digest but when he is not having a flare, he is able to eat most of what he wants. Pt does like vanilla Ensure and Boost and he drinks these at times when he does not eat well. Pt reports that at baseline, he only eats 1-2 meals per day. Pt reports that for two weeks pta, he has been having reflux which has been causing him to gag a couple times a day. Pt reports that he has also been having poor appetite and oral intake r/t nausea and this gagging. Pt reports that he is feeling better today; pt ate 95% of his breakfast this morning. Pt would like to have vanilla Ensure while he is in the hospital. RD will also add daily MVI. Pt reports that he does not take any vitamins at home exect for vitamin D as he reports a h/o bone/mineral disease. Recommended daily MVI in setting of Crohn's and possible malabsorption.    Per chart, pt appears fairly weight stable at baseline.   Medications reviewed and include: lovenox, protonix, ciprofloxacin, metronidazole   Labs reviewed:   Unable to complete Nutrition-Focused physical exam at this time.   Diet Order:   Diet  Order            Diet full liquid Room service appropriate? Yes; Fluid consistency: Thin  Diet effective now             EDUCATION NEEDS:   Education needs have been addressed  Skin:  Skin Assessment: Reviewed RN Assessment  Last BM:  2/7  Height:   Ht Readings from Last 1 Encounters:  03/18/19 5' 5"  (1.651 m)    Weight:   Wt Readings from Last 1 Encounters:  03/19/19 64 kg    Ideal Body Weight:  61.8 kg  BMI:  Body mass index is 23.5 kg/m.  Estimated Nutritional Needs:   Kcal:  1800-2100kcal/day  Protein:  90-105g/day  Fluid:  >1.9L/day  Koleen Distance MS, RD, LDN Contact information available in Amion

## 2019-03-20 DIAGNOSIS — K50119 Crohn's disease of large intestine with unspecified complications: Secondary | ICD-10-CM

## 2019-03-20 DIAGNOSIS — K604 Rectal fistula: Secondary | ICD-10-CM

## 2019-03-20 LAB — BASIC METABOLIC PANEL
Anion gap: 7 (ref 5–15)
BUN: 8 mg/dL (ref 6–20)
CO2: 29 mmol/L (ref 22–32)
Calcium: 8.5 mg/dL — ABNORMAL LOW (ref 8.9–10.3)
Chloride: 105 mmol/L (ref 98–111)
Creatinine, Ser: 0.9 mg/dL (ref 0.61–1.24)
GFR calc Af Amer: >60 mL/min (ref >60)
GFR calc non Af Amer: >60 mL/min (ref >60)
Glucose, Bld: 123 mg/dL — ABNORMAL HIGH (ref 70–99)
Potassium: 4.1 mmol/L (ref 3.5–5.1)
Sodium: 141 mmol/L (ref 135–145)

## 2019-03-20 LAB — C-REACTIVE PROTEIN: CRP: 1.8 mg/dL — ABNORMAL HIGH (ref <1.0)

## 2019-03-20 MED ORDER — PREDNISONE 20 MG PO TABS: 20.0000 mg | ORAL_TABLET | Freq: Every day | ORAL | 0 refills | Status: DC

## 2019-03-20 MED ORDER — ENSURE ENLIVE PO LIQD: 237.0000 mL | Freq: Three times a day (TID) | ORAL | 12 refills | Status: DC

## 2019-03-20 MED ORDER — LOPERAMIDE HCL 2 MG PO TABS: 2.0000 mg | ORAL_TABLET | Freq: Four times a day (QID) | ORAL | 0 refills | Status: DC | PRN

## 2019-03-20 MED ORDER — METRONIDAZOLE 500 MG PO TABS
500.0000 mg | ORAL_TABLET | Freq: Two times a day (BID) | ORAL | Status: DC
  Filled 2019-03-20: qty 1

## 2019-03-20 MED ORDER — OXYCODONE-ACETAMINOPHEN 5-325 MG PO TABS: 1.0000 | ORAL_TABLET | Freq: Four times a day (QID) | ORAL | 0 refills | Status: AC | PRN

## 2019-03-20 MED ORDER — MESALAMINE 4 G RE ENEM: 4.0000 g | ENEMA | Freq: Every day | RECTAL | 0 refills | Status: DC

## 2019-03-20 MED ORDER — METRONIDAZOLE 500 MG PO TABS: 500.0000 mg | ORAL_TABLET | Freq: Three times a day (TID) | ORAL | 0 refills | Status: DC

## 2019-03-20 MED ORDER — CIPROFLOXACIN HCL 500 MG PO TABS: 500.0000 mg | ORAL_TABLET | Freq: Two times a day (BID) | ORAL | 0 refills | Status: DC

## 2019-03-20 MED ORDER — CIPROFLOXACIN HCL 500 MG PO TABS
500.0000 mg | ORAL_TABLET | Freq: Two times a day (BID) | ORAL | Status: DC
  Filled 2019-03-20: qty 1

## 2019-03-20 MED ORDER — METRONIDAZOLE 500 MG PO TABS: 500.0000 mg | ORAL_TABLET | Freq: Two times a day (BID) | ORAL | 0 refills | Status: DC

## 2019-03-20 NOTE — Discharge Summary (Addendum)
Physician Discharge Summary  Leroy Hernandez DOB: 06/27/1985 DOA: 03/18/2019  PCP: [name], MD  Admit date: 03/18/2019 Discharge date: 03/20/2019  Admitted From: Home Disposition: Home  Recommendations for Outpatient Follow-up:  1. Patient is being discharged on oral ciprofloxacin and Flagyl twice daily for  30 days.  (Until 3/11) . also prescribed Rowasa enemas once a day along with antibiotic.  Patient is also prescribed oral prednisone 40 mg daily for 1 week followed by 10 mg taper weekly. 2. Follow-up with PCP in 1-2 weeks.  Please follow GI pathogen panel results.  Patient has appointment with GI in 4 weeks.  Home Health: None Equipment/Devices: None  Discharge Condition: Fair CODE STATUS: Full code Diet recommendation: Regular   Discharge Diagnoses:  Principal Problem:   Crohn's colitis, unspecified complication ([place])   Active Problems:   Crohn's disease of both small and large intestine with other complication ([facility])   Mild episode of recurrent major depressive disorder ([facility])   Asthma, mild intermittent   Perirectal fistula  Brief narrative/HPI 34 year old male with history of Crohn's disease s/p right hemicolectomy on Remicade as outpatient (follows with Dr. [name]) presented with bright red blood per rectum, severe perirectal pain with poor appetite and vomiting for past 2 weeks.  He was hospitalized in October 2020 for Crohn's flareup.  Has chronic perianal fistula and skin tag.  In the ED CT of the abdomen pelvis showed chronic wall thickening of the terminal ileum and colon at the anastomosis with subtle edema and inflammation and mild lymphadenopathy.  No perforation or abscess.  Right perirectal fistula noted unchanged from prior. Admitted for acute flareup of Crohn's disease and started on IV antibiotics and steroids along with IV fluids.   Hospital course  Principal Problem:   Crohn's disease of both small and large intestine with other  complication ([place])  Stool for C. difficile negative.  GI pathogen panel pending.    Received empiric steroids, ciprofloxacin and Flagyl.  Pain control with IV Dilaudid. Surgery consulted to eval for perirectal fistula and recommended this seems unchanged from prior and no surgical intervention needed.  Recommend follow-up with colorectal surgeon.   Has had 2 episodes of diarrhea today with occasional blood mixed stool. GI consult appreciated.  Recommend no further intervention and outpatient evaluation by colorectal surgeon.  Patient hesitant to see a surgeon at this time and would like to discuss with Dr. [name] when he sees her next month.  Patient tolerating advance diet.    As per GI recommendation he will be discharged on oral ciprofloxacin and Flagyl twice daily for 1 month along with Rowasa enema once a day.  Also discharged on oral prednisone 40 mg for 1 week followed by quick taper of 10 mg weekly.  Patient instructed to return to ED if he has further worsening episodes of diarrhea, increasing bright red blood per rectum, nausea, vomiting, fever or abdominal pain.  Active Problems: recurrent major depressive disorder ([place]) Denies worsening symptoms or suicidal ideation.  Continue Lexapro and Abilify.  Dehydration Improved with fluids.    Family Communication  : None  Disposition Plan  : Home   Consults  : GI, surgery  Procedures  : CT abdomen pelvis   Discharge Instructions   Allergies as of 03/20/2019   No Known Allergies     Medication List    TAKE these medications   albuterol 108 (90 Base) MCG/ACT inhaler Commonly known as: VENTOLIN HFA Inhale 2 puffs into the lungs every 6 (  six) hours as needed for wheezing.   ARIPiprazole 5 MG tablet Commonly known as: ABILIFY Take 5 mg by mouth daily.   busPIRone 5 MG tablet Commonly known as: BUSPAR Take 5 mg by mouth 2 (two) times daily.   ciprofloxacin 500 MG tablet Commonly known as: CIPRO Take 1  tablet (500 mg total) by mouth 2 (two) times daily.  For 30 days   escitalopram 10 MG tablet Commonly known as: LEXAPRO Take 1 tablet (10 mg total) by mouth daily.   feeding supplement (ENSURE ENLIVE) Liqd Take 237 mLs by mouth 3 (three) times daily between meals.   hydrOXYzine 25 MG capsule Commonly known as: Vistaril Take 1 capsule (25 mg total) by mouth daily as needed (severe anxiety attacks).   loperamide 2 MG tablet Commonly known as: Imodium A-D Take 1 tablet (2 mg total) by mouth 4 (four) times daily as needed for diarrhea or loose stools.   methotrexate 2.5 MG tablet Commonly known as: RHEUMATREX TAKE 10 TABLETS (25 MG TOTAL) BY MOUTH ONCE A WEEK. CAUTION:CHEMOTHERAPY. PROTECT FROM LIGHT.   metroNIDAZOLE 500 MG tablet Commonly known as: FLAGYL Take 1 tablet (500 mg total) by mouth 2 (two) times daily for 30 days   oxyCODONE-acetaminophen 5-325 MG tablet Commonly known as: Percocet Take 1 tablet by mouth every 6 (six) hours as needed for up to 4 days for severe pain.   predniSONE 20 MG tablet Commonly known as: DELTASONE Take 1 tablet (20 mg total) by mouth daily with breakfast. 40 mg daily for 7 days followed by 30 mg daily for ne next 7 days, then 20 mg daily for next 7 days and then 10 mg daily for next 7 days then stop.   Remicade 100 MG injection Generic drug: inFLIXimab Inject 100 mg into the vein every 8 (eight) weeks.      Follow-up Information    [name], MD. Schedule an appointment as soon as possible for a visit in 1 week(s).   Specialty: Internal Medicine Contact information: [place] [address] [phone]        Lin Landsman, MD Follow up in 1 month(s).   Specialty: Gastroenterology Why: HAS APPT Contact information: [place] [address] [phone]          No Known Allergies      Procedures/Studies: CT ABDOMEN PELVIS W  CONTRAST  Result Date: 03/18/2019 CLINICAL DATA:  Crohn disease suspected perirectal pain for 2 weeks. Decreased appetite. Vomiting. Intermittent bloody stools. EXAM: CT ABDOMEN AND PELVIS WITH CONTRAST TECHNIQUE: Multidetector CT imaging of the abdomen and pelvis was performed using the standard protocol following bolus administration of intravenous contrast. CONTRAST:  157m OMNIPAQUE IOHEXOL 300 MG/ML  SOLN COMPARISON:  09/10/2017 FINDINGS: Lower chest: Unremarkable Hepatobiliary: No suspicious focal abnormality within the liver parenchyma. Tiny calcified gallstones noted. No intrahepatic or extrahepatic biliary dilation. Pancreas: No focal mass lesion. No dilatation of the main duct. No intraparenchymal cyst. No peripancreatic edema. Spleen: No splenomegaly. No focal mass lesion. Adrenals/Urinary Tract: No adrenal nodule or mass. Stable 7 mm hypodensity in the interpolar right kidney, likely a cyst. Left kidney unremarkable. No evidence for hydroureter. The urinary bladder appears normal for the degree of distention. Stomach/Bowel: Stomach is unremarkable. No gastric wall thickening. No evidence of outlet obstruction. Duodenum is normally positioned as is the ligament of Treitz. No small bowel wall thickening. No small bowel dilatation. Right hemicolectomy with ileocolic anastomosis at the  mid transverse colon. There is some wall thickening in the neo terminal ileum and blind stump of the colon at the level of the anastomosis with subtle adjacent edema. Mild lymphadenopathy is noted in the adjacent mesentery. Imaging features are similar to the prior study of 09/10/2017. There is wall thickening in the distal sigmoid colon and rectum with perirectal and sigmoid mesocolon lymphadenopathy. Similar appearance of the right perirectal fistula. Vascular/Lymphatic: No abdominal aortic aneurysm. No retroperitoneal lymphadenopathy. No pelvic sidewall lymphadenopathy. Reproductive: The prostate gland and seminal vesicles  are unremarkable. Other: No intraperitoneal free fluid. No evidence for intraperitoneal abscess. Musculoskeletal: No worrisome lytic or sclerotic osseous abnormality. IMPRESSION: 1. Patient is status post right hemicolectomy with similar appearance of wall thickening in the neo terminal ileum and colon at the anastomosis. This is associated with subtle edema/inflammation in the region the anastomosis and mild lymphadenopathy in the adjacent mesentery. No evidence for perforation or abscess. 2. Similar appearance of wall thickening in the distal sigmoid colon and rectum with mild perirectal lymphadenopathy also unchanged. 3. Right perirectal fistula, similar to prior. No evidence for an associated abscess. 4. Cholelithiasis. Electronically Signed   By: Misty Stanley M.D.   On: 03/18/2019 14:51       Subjective: Reports 2 episodes of watery diarrhea today.  No abdominal pain and tolerating diet.  Discharge Exam: Vitals:   03/20/19 0434 03/20/19 0721  BP: 112/70 120/71  Pulse: (!) 55 (!) 57  Resp: 20 17  Temp: 97.8 F (36.6 C) 97.6 F (36.4 C)  SpO2: 99% 100%   Vitals:   03/19/19 1515 03/19/19 1917 03/20/19 0434 03/20/19 0721  BP: 134/76 121/67 112/70 120/71  Pulse: 83 63 (!) 55 (!) 57  Resp: 18 20 20 17   Temp: 97.6 F (36.4 C) 97.8 F (36.6 C) 97.8 F (36.6 C) 97.6 F (36.4 C)  TempSrc: Oral Oral Oral Oral  SpO2: 99% 99% 99% 100%  Weight:      Height:        General: Middle-aged male not in distress HEENT: Moist mucosa, supple neck Chest: Clear CVs: Normal S1-S2 GI: Soft, nondistended, nontender Musculoskeletal: Warm, no edema    The results of significant diagnostics from this hospitalization (including imaging, microbiology, ancillary and laboratory) are listed below for reference.     Microbiology: Recent Results (from the past 240 hour(s))  C Difficile Quick Screen w PCR reflex     Status: None   Collection Time: 03/18/19 11:41 AM   Specimen: Stool  Result Value  Ref Range Status   C Diff antigen NEGATIVE NEGATIVE Final   C Diff toxin NEGATIVE NEGATIVE Final   C Diff interpretation No C. difficile detected.  Final    Comment: Performed at [facility], [address] [place], [place] [address]  Respiratory Panel by RT PCR (Flu A&B, Covid) -     Status: None   Collection Time: 03/18/19  1:27 PM  Result Value Ref Range Status   SARS Coronavirus 2 by RT PCR NEGATIVE NEGATIVE Final    Comment: (NOTE) SARS-CoV-2 target nucleic acids are NOT DETECTED. The SARS-CoV-2 RNA is generally detectable in upper respiratoy specimens during the acute phase of infection. The lowest concentration of SARS-CoV-2 viral copies this assay can detect is 131 copies/mL. A negative result does not preclude SARS-Cov-2 infection and should not be used as the sole basis for treatment or other patient management decisions. A negative result may occur with  improper specimen collection/handling, submission of specimen other than nasopharyngeal  swab, presence of viral mutation(s) within the areas targeted by this assay, and inadequate number of viral copies (<131 copies/mL). A negative result must be combined with clinical observations, patient history, and epidemiological information. The expected result is Negative. Fact Sheet for Patients:  PinkCheek.be Fact Sheet for Healthcare Providers:  GravelBags.it This test is not yet ap proved or cleared by the [place] FDA and  has been authorized for detection and/or diagnosis of SARS-CoV-2 by FDA under an Emergency Use Authorization (EUA). This EUA will remain  in effect (meaning this test can be used) for the duration of the COVID-19 declaration under Section 564(b)(1) of the Act, 21 U.S.C. section 360bbb-3(b)(1), unless the authorization is terminated or revoked sooner.    Influenza A by PCR NEGATIVE NEGATIVE Final   Influenza B by PCR NEGATIVE  NEGATIVE Final    Comment: (NOTE) The Xpert Xpress SARS-CoV-2/FLU/RSV assay is intended as an aid in  the diagnosis of influenza from Nasopharyngeal swab specimens and  should not be used as a sole basis for treatment. Nasal washings and  aspirates are unacceptable for Xpert Xpress SARS-CoV-2/FLU/RSV  testing. Fact Sheet for Patients: PinkCheek.be Fact Sheet for Healthcare Providers: GravelBags.it This test is not yet approved or cleared by the [place] FDA and  has been authorized for detection and/or diagnosis of SARS-CoV-2 by  FDA under an Emergency Use Authorization (EUA). This EUA will remain  in effect (meaning this test can be used) for the duration of the  Covid-19 declaration under Section 564(b)(1) of the Act, 21  U.S.C. section 360bbb-3(b)(1), unless the authorization is  terminated or revoked. Performed at [facility], [address] [place], [place] [address]      Labs: BNP (last 3 results) No results for input(s): BNP in the last 8760 hours. Basic Metabolic Panel: Recent Labs  Lab 03/18/19 1053 03/20/19 0437  NA 139 141  K 3.5 4.1  CL 105 105  CO2 29 29  GLUCOSE 97 123*  BUN 8 8  CREATININE 0.88 0.90  CALCIUM 8.7* 8.5*   Liver Function Tests: Recent Labs  Lab 03/18/19 1053  AST 33  ALT 48*  ALKPHOS 77  BILITOT 0.8  PROT 7.4  ALBUMIN 3.8   Recent Labs  Lab 03/18/19 1053  LIPASE 31   No results for input(s): AMMONIA in the last 168 hours. CBC: Recent Labs  Lab 03/18/19 1053  WBC 7.3  HGB 13.8  HCT 42.7  MCV 80.1  PLT 295   Cardiac Enzymes: No results for input(s): CKTOTAL, CKMB, CKMBINDEX, TROPONINI in the last 168 hours. BNP: Invalid input(s): POCBNP CBG: No results for input(s): GLUCAP in the last 168 hours. D-Dimer No results for input(s): DDIMER in the last 72 hours. Hgb A1c No results for input(s): HGBA1C in the last 72 hours. Lipid Profile No  results for input(s): CHOL, HDL, LDLCALC, TRIG, CHOLHDL, LDLDIRECT in the last 72 hours. Thyroid function studies No results for input(s): TSH, T4TOTAL, T3FREE, THYROIDAB in the last 72 hours.  Invalid input(s): FREET3 Anemia work up No results for input(s): VITAMINB12, FOLATE, FERRITIN, TIBC, IRON, RETICCTPCT in the last 72 hours. Urinalysis    Component Value Date/Time   COLORURINE YELLOW (A) 03/18/2019 1327   APPEARANCEUR CLEAR (A) 03/18/2019 1327   APPEARANCEUR Hazy 03/14/2013 1455   LABSPEC 1.015 03/18/2019 1327   LABSPEC 1.019 03/14/2013 1455   PHURINE 6.0 03/18/2019 1327   GLUCOSEU NEGATIVE 03/18/2019 1327   GLUCOSEU Negative 03/14/2013 1455   HGBUR NEGATIVE 03/18/2019 1327  BILIRUBINUR NEGATIVE 03/18/2019 1327   BILIRUBINUR Negative 03/14/2013 1455   KETONESUR 5 (A) 03/18/2019 1327   PROTEINUR 30 (A) 03/18/2019 1327   NITRITE NEGATIVE 03/18/2019 1327   LEUKOCYTESUR NEGATIVE 03/18/2019 1327   LEUKOCYTESUR Negative 03/14/2013 1455   Sepsis Labs Invalid input(s): PROCALCITONIN,  WBC,  LACTICIDVEN Microbiology Recent Results (from the past 240 hour(s))  C Difficile Quick Screen w PCR reflex     Status: None   Collection Time: 03/18/19 11:41 AM   Specimen: Stool  Result Value Ref Range Status   C Diff antigen NEGATIVE NEGATIVE Final   C Diff toxin NEGATIVE NEGATIVE Final   C Diff interpretation No C. difficile detected.  Final    Comment: Performed at [facility], [address] [place], [place] [address]  Respiratory Panel by RT PCR (Flu A&B, Covid) -     Status: None   Collection Time: 03/18/19  1:27 PM  Result Value Ref Range Status   SARS Coronavirus 2 by RT PCR NEGATIVE NEGATIVE Final    Comment: (NOTE) SARS-CoV-2 target nucleic acids are NOT DETECTED. The SARS-CoV-2 RNA is generally detectable in upper respiratoy specimens during the acute phase of infection. The lowest concentration of SARS-CoV-2 viral copies this assay can detect is 131  copies/mL. A negative result does not preclude SARS-Cov-2 infection and should not be used as the sole basis for treatment or other patient management decisions. A negative result may occur with  improper specimen collection/handling, submission of specimen other than nasopharyngeal swab, presence of viral mutation(s) within the areas targeted by this assay, and inadequate number of viral copies (<131 copies/mL). A negative result must be combined with clinical observations, patient history, and epidemiological information. The expected result is Negative. Fact Sheet for Patients:  PinkCheek.be Fact Sheet for Healthcare Providers:  GravelBags.it This test is not yet ap proved or cleared by the [place] FDA and  has been authorized for detection and/or diagnosis of SARS-CoV-2 by FDA under an Emergency Use Authorization (EUA). This EUA will remain  in effect (meaning this test can be used) for the duration of the COVID-19 declaration under Section 564(b)(1) of the Act, 21 U.S.C. section 360bbb-3(b)(1), unless the authorization is terminated or revoked sooner.    Influenza A by PCR NEGATIVE NEGATIVE Final   Influenza B by PCR NEGATIVE NEGATIVE Final    Comment: (NOTE) The Xpert Xpress SARS-CoV-2/FLU/RSV assay is intended as an aid in  the diagnosis of influenza from Nasopharyngeal swab specimens and  should not be used as a sole basis for treatment. Nasal washings and  aspirates are unacceptable for Xpert Xpress SARS-CoV-2/FLU/RSV  testing. Fact Sheet for Patients: PinkCheek.be Fact Sheet for Healthcare Providers: GravelBags.it This test is not yet approved or cleared by the [place] FDA and  has been authorized for detection and/or diagnosis of SARS-CoV-2 by  FDA under an Emergency Use Authorization (EUA). This EUA will remain  in effect (meaning this test can  be used) for the duration of the  Covid-19 declaration under Section 564(b)(1) of the Act, 21  U.S.C. section 360bbb-3(b)(1), unless the authorization is  terminated or revoked. Performed at [facility], [address], [place], [place] [address]      Time coordinating discharge: 35 minutes  SIGNED:   Louellen Molder, MD  [facility] 03/20/2019, 11:35 AM Pager   If 7PM-7AM, please contact night-coverage [email or web address] Password [email or web address]

## 2019-03-20 NOTE — Progress Notes (Signed)
Leroy Lame, MD [facility]   [address], [place], [place] [address] Phone: [phone] Fax : [phone]   Subjective: The patient feels much better today and had a discussion with Dr. [name] his primary gastroenterologist.  The patient states that his abdominal pain is much better and so is his rectal pain.  His bloody diarrheas have decreased.   Objective: Vital signs in last 24 hours: Vitals:   03/19/19 1515 03/19/19 1917 03/20/19 0434 03/20/19 0721  BP: 134/76 121/67 112/70 120/71  Pulse: 83 63 (!) 55 (!) 57  Resp: 18 20 20 17   Temp: 97.6 F (36.4 C) 97.8 F (36.6 C) 97.8 F (36.6 C) 97.6 F (36.4 C)  TempSrc: Oral Oral Oral Oral  SpO2: 99% 99% 99% 100%  Weight:      Height:       Weight change:   Intake/Output Summary (Last 24 hours) at 03/20/2019 1156 Last data filed at 03/19/2019 1901 Gross per 24 hour  Intake 1737.54 ml  Output --  Net 1737.54 ml     Exam: Heart:: Regular rate and rhythm, S1S2 present or without murmur or extra heart sounds Lungs: normal and clear to auscultation and percussion Abdomen: soft, nontender, normal bowel sounds   Lab Results: @LABTEST2 @ Micro Results: Recent Results (from the past 240 hour(s))  C Difficile Quick Screen w PCR reflex     Status: None   Collection Time: 03/18/19 11:41 AM   Specimen: Stool  Result Value Ref Range Status   C Diff antigen NEGATIVE NEGATIVE Final   C Diff toxin NEGATIVE NEGATIVE Final   C Diff interpretation No C. difficile detected.  Final    Comment: Performed at [facility], [address] [place], [place] [address]  Respiratory Panel by RT PCR (Flu A&B, Covid) -     Status: None   Collection Time: 03/18/19  1:27 PM  Result Value Ref Range Status   SARS Coronavirus 2 by RT PCR NEGATIVE NEGATIVE Final    Comment: (NOTE) SARS-CoV-2 target nucleic acids are NOT DETECTED. The SARS-CoV-2 RNA is generally detectable in upper respiratoy specimens during the acute phase of  infection. The lowest concentration of SARS-CoV-2 viral copies this assay can detect is 131 copies/mL. A negative result does not preclude SARS-Cov-2 infection and should not be used as the sole basis for treatment or other patient management decisions. A negative result may occur with  improper specimen collection/handling, submission of specimen other than nasopharyngeal swab, presence of viral mutation(s) within the areas targeted by this assay, and inadequate number of viral copies (<131 copies/mL). A negative result must be combined with clinical observations, patient history, and epidemiological information. The expected result is Negative. Fact Sheet for Patients:  PinkCheek.be Fact Sheet for Healthcare Providers:  GravelBags.it This test is not yet ap proved or cleared by the [place] FDA and  has been authorized for detection and/or diagnosis of SARS-CoV-2 by FDA under an Emergency Use Authorization (EUA). This EUA will remain  in effect (meaning this test can be used) for the duration of the COVID-19 declaration under Section 564(b)(1) of the Act, 21 U.S.C. section 360bbb-3(b)(1), unless the authorization is terminated or revoked sooner.    Influenza A by PCR NEGATIVE NEGATIVE Final   Influenza B by PCR NEGATIVE NEGATIVE Final    Comment: (NOTE) The Xpert Xpress SARS-CoV-2/FLU/RSV assay is intended as an aid in  the diagnosis of influenza from Nasopharyngeal swab specimens and  should not be used as a sole basis  for treatment. Nasal washings and  aspirates are unacceptable for Xpert Xpress SARS-CoV-2/FLU/RSV  testing. Fact Sheet for Patients: PinkCheek.be Fact Sheet for Healthcare Providers: GravelBags.it This test is not yet approved or cleared by the [place] FDA and  has been authorized for detection and/or diagnosis of SARS-CoV-2 by  FDA under  an Emergency Use Authorization (EUA). This EUA will remain  in effect (meaning this test can be used) for the duration of the  Covid-19 declaration under Section 564(b)(1) of the Act, 21  U.S.C. section 360bbb-3(b)(1), unless the authorization is  terminated or revoked. Performed at [facility], [address] [place], [place] [address]    Studies/Results: CT ABDOMEN PELVIS W CONTRAST  Result Date: 03/18/2019 CLINICAL DATA:  Crohn disease suspected perirectal pain for 2 weeks. Decreased appetite. Vomiting. Intermittent bloody stools. EXAM: CT ABDOMEN AND PELVIS WITH CONTRAST TECHNIQUE: Multidetector CT imaging of the abdomen and pelvis was performed using the standard protocol following bolus administration of intravenous contrast. CONTRAST:  15m OMNIPAQUE IOHEXOL 300 MG/ML  SOLN COMPARISON:  09/10/2017 FINDINGS: Lower chest: Unremarkable Hepatobiliary: No suspicious focal abnormality within the liver parenchyma. Tiny calcified gallstones noted. No intrahepatic or extrahepatic biliary dilation. Pancreas: No focal mass lesion. No dilatation of the main duct. No intraparenchymal cyst. No peripancreatic edema. Spleen: No splenomegaly. No focal mass lesion. Adrenals/Urinary Tract: No adrenal nodule or mass. Stable 7 mm hypodensity in the interpolar right kidney, likely a cyst. Left kidney unremarkable. No evidence for hydroureter. The urinary bladder appears normal for the degree of distention. Stomach/Bowel: Stomach is unremarkable. No gastric wall thickening. No evidence of outlet obstruction. Duodenum is normally positioned as is the ligament of Treitz. No small bowel wall thickening. No small bowel dilatation. Right hemicolectomy with ileocolic anastomosis at the mid transverse colon. There is some wall thickening in the neo terminal ileum and blind stump of the colon at the level of the anastomosis with subtle adjacent edema. Mild lymphadenopathy is noted in the adjacent mesentery. Imaging  features are similar to the prior study of 09/10/2017. There is wall thickening in the distal sigmoid colon and rectum with perirectal and sigmoid mesocolon lymphadenopathy. Similar appearance of the right perirectal fistula. Vascular/Lymphatic: No abdominal aortic aneurysm. No retroperitoneal lymphadenopathy. No pelvic sidewall lymphadenopathy. Reproductive: The prostate gland and seminal vesicles are unremarkable. Other: No intraperitoneal free fluid. No evidence for intraperitoneal abscess. Musculoskeletal: No worrisome lytic or sclerotic osseous abnormality. IMPRESSION: 1. Patient is status post right hemicolectomy with similar appearance of wall thickening in the neo terminal ileum and colon at the anastomosis. This is associated with subtle edema/inflammation in the region the anastomosis and mild lymphadenopathy in the adjacent mesentery. No evidence for perforation or abscess. 2. Similar appearance of wall thickening in the distal sigmoid colon and rectum with mild perirectal lymphadenopathy also unchanged. 3. Right perirectal fistula, similar to prior. No evidence for an associated abscess. 4. Cholelithiasis. Electronically Signed   By: EMisty StanleyM.D.   On: 03/18/2019 14:51   Medications: I have reviewed the patient's current medications. Scheduled Meds: . ARIPiprazole  5 mg Oral Daily  . ciprofloxacin  500 mg Oral BID  . enoxaparin (LOVENOX) injection  40 mg Subcutaneous Q24H  . escitalopram  10 mg Oral Daily  . feeding supplement (ENSURE ENLIVE)  237 mL Oral TID BM  . metroNIDAZOLE  500 mg Oral Q12H  . multivitamin with minerals  1 tablet Oral Daily  . pantoprazole (PROTONIX) IV  40 mg Intravenous Q12H   Continuous Infusions: .  sodium chloride 100 mL/hr at 03/20/19 0058   PRN Meds:.HYDROmorphone (DILAUDID) injection, hydrOXYzine   Assessment: Principal Problem:   Crohn's colitis, unspecified complication (HCC) Active Problems:   Mild episode of recurrent major depressive  disorder (HCC)   Asthma, mild intermittent   Perirectal fistula   Crohn's disease of both small and large intestine with other complication ([place])    Plan: This patient had a Crohn's flare and is doing much better today.  I have spoken to [place] his primary gastroenterologist and she recommended the patient going home with Rowasa enemas once a day along with Cipro and Flagyl twice daily for a month and a short course of prednisone 40 mg for 1 week followed by a quick taper of 10 mg weekly.  The patient has been explained this and I will contact the hospitalist to make sure that he is on board with this.   LOS: 1 day   Leroy Hernandez 03/20/2019, 11:56 AM Pager [phone] 7am-5pm  Check AMION for 5pm -7am coverage and on weekends

## 2019-03-20 NOTE — Progress Notes (Signed)
Discharge instructions given to patient along with letter for work. All questions answered.

## 2019-03-22 LAB — GI PATHOGEN PANEL BY PCR, STOOL

## 2019-03-27 LAB — CALPROTECTIN, FECAL: Calprotectin, Fecal: 767 ug/g — ABNORMAL HIGH (ref 0–120)

## 2019-03-30 ENCOUNTER — Telehealth: Payer: Self-pay

## 2019-03-30 NOTE — Telephone Encounter (Signed)
Called and left a message for call back to make virtual appointment. Please make appointment

## 2019-03-30 NOTE — Telephone Encounter (Signed)
-----   Message from Lin Landsman, MD sent at 03/29/2019  5:38 PM EST ----- Please arrange virtual visit to see me next week  RV

## 2019-04-02 NOTE — Telephone Encounter (Signed)
Patient states he can not make appointment this week because he works everyday. Patient states his next day off work is March 4th. Made virtual appointment then

## 2019-04-09 ENCOUNTER — Other Ambulatory Visit: Payer: Self-pay

## 2019-04-09 ENCOUNTER — Emergency Department
Admission: EM | Admit: 2019-04-09 | Discharge: 2019-04-09 | Disposition: A | Discharge status: Home / Self Care | Payer: BC Managed Care – PPO | Attending: Student | Admitting: Student

## 2019-04-09 ENCOUNTER — Emergency Department: Payer: BC Managed Care – PPO

## 2019-04-09 DIAGNOSIS — K604 Rectal fistula: Secondary | ICD-10-CM | POA: Diagnosis not present

## 2019-04-09 DIAGNOSIS — K5010 Crohn's disease of large intestine without complications: Secondary | ICD-10-CM | POA: Insufficient documentation

## 2019-04-09 DIAGNOSIS — Z79899 Other long term (current) drug therapy: Secondary | ICD-10-CM | POA: Insufficient documentation

## 2019-04-09 DIAGNOSIS — R109 Unspecified abdominal pain: Secondary | ICD-10-CM | POA: Diagnosis present

## 2019-04-09 LAB — URINALYSIS, COMPLETE (UACMP) WITH MICROSCOPIC
Bacteria, UA: NONE SEEN
Bilirubin Urine: NEGATIVE
Glucose, UA: NEGATIVE mg/dL
Hgb urine dipstick: NEGATIVE
Ketones, ur: 5 mg/dL — AB
Leukocytes,Ua: NEGATIVE
Nitrite: NEGATIVE
Protein, ur: 100 mg/dL — AB
Specific Gravity, Urine: 1.026 (ref 1.005–1.030)
pH: 5 (ref 5.0–8.0)

## 2019-04-09 LAB — CBC
HCT: 47.5 % (ref 39.0–52.0)
Hemoglobin: 15.2 g/dL (ref 13.0–17.0)
MCH: 25.3 pg — ABNORMAL LOW (ref 26.0–34.0)
MCHC: 32 g/dL (ref 30.0–36.0)
MCV: 79.2 fL — ABNORMAL LOW (ref 80.0–100.0)
Platelets: 257 10*3/uL (ref 150–400)
RBC: 6 MIL/uL — ABNORMAL HIGH (ref 4.22–5.81)
RDW: 13.2 % (ref 11.5–15.5)
WBC: 7.2 10*3/uL (ref 4.0–10.5)
nRBC: 0 % (ref 0.0–0.2)

## 2019-04-09 LAB — COMPREHENSIVE METABOLIC PANEL
ALT: 51 U/L — ABNORMAL HIGH (ref 0–44)
AST: 30 U/L (ref 15–41)
Albumin: 3.8 g/dL (ref 3.5–5.0)
Alkaline Phosphatase: 67 U/L (ref 38–126)
Anion gap: 11 (ref 5–15)
BUN: 12 mg/dL (ref 6–20)
CO2: 26 mmol/L (ref 22–32)
Calcium: 8.7 mg/dL — ABNORMAL LOW (ref 8.9–10.3)
Chloride: 99 mmol/L (ref 98–111)
Creatinine, Ser: 1.09 mg/dL (ref 0.61–1.24)
GFR calc Af Amer: >60 mL/min (ref >60)
GFR calc non Af Amer: >60 mL/min (ref >60)
Glucose, Bld: 130 mg/dL — ABNORMAL HIGH (ref 70–99)
Potassium: 3.3 mmol/L — ABNORMAL LOW (ref 3.5–5.1)
Sodium: 136 mmol/L (ref 135–145)
Total Bilirubin: 1.6 mg/dL — ABNORMAL HIGH (ref 0.3–1.2)
Total Protein: 7.6 g/dL (ref 6.5–8.1)

## 2019-04-09 LAB — LIPASE, BLOOD: Lipase: 23 U/L (ref 11–51)

## 2019-04-09 LAB — C-REACTIVE PROTEIN: CRP: 15.2 mg/dL — ABNORMAL HIGH (ref <1.0)

## 2019-04-09 LAB — SEDIMENTATION RATE: Sed Rate: 5 mm/hr (ref 0–15)

## 2019-04-09 MED ORDER — IOHEXOL 9 MG/ML PO SOLN
500.0000 mL | ORAL | Status: AC
  Administered 2019-04-09: 500 mL via ORAL

## 2019-04-09 MED ORDER — MORPHINE SULFATE (PF) 4 MG/ML IV SOLN
4.0000 mg | Freq: Once | INTRAVENOUS | Status: AC
  Administered 2019-04-09: 4 mg via INTRAVENOUS
  Filled 2019-04-09: qty 1

## 2019-04-09 MED ORDER — OXYCODONE-ACETAMINOPHEN 5-325 MG PO TABS: 1.0000 | ORAL_TABLET | Freq: Four times a day (QID) | ORAL | 0 refills | Status: AC | PRN

## 2019-04-09 MED ORDER — IOHEXOL 300 MG/ML  SOLN
100.0000 mL | Freq: Once | INTRAMUSCULAR | Status: AC | PRN
  Administered 2019-04-09: 100 mL via INTRAVENOUS

## 2019-04-09 MED ORDER — MORPHINE SULFATE (PF) 4 MG/ML IV SOLN: 4.0000 mg | Freq: Once | INTRAVENOUS | Status: DC

## 2019-04-09 MED ORDER — ONDANSETRON HCL 4 MG/2ML IJ SOLN
4.0000 mg | Freq: Once | INTRAMUSCULAR | Status: AC
  Administered 2019-04-09: 4 mg via INTRAVENOUS
  Filled 2019-04-09: qty 2

## 2019-04-09 MED ORDER — SODIUM CHLORIDE 0.9% FLUSH: 3.0000 mL | Freq: Once | INTRAVENOUS | Status: DC

## 2019-04-09 MED ORDER — SODIUM CHLORIDE 0.9 % IV BOLUS
1000.0000 mL | Freq: Once | INTRAVENOUS | Status: AC
  Administered 2019-04-09: 1000 mL via INTRAVENOUS

## 2019-04-09 NOTE — ED Triage Notes (Signed)
Pt c/o generalized abd cramping with bloody diarrhea for the past 2 days, states he has a hx of crohns and feels like this is a flare up. Pt also c/o a wound that will not heal on the right buttock.

## 2019-04-09 NOTE — ED Provider Notes (Signed)
[facility] Emergency Department Provider Note  ____________________________________________   First MD Initiated Contact with Patient 04/09/19 1121     (approximate)  I have reviewed the triage vital signs and the nursing notes.  History  Chief Complaint Abdominal Pain    HPI Leroy Hernandez is a 34 y.o. male with history of Crohn's disease (s/p hemicolectomy) who presents to the emergency department for increasing abdominal pain, rectal pain, pain at known fistula site. Symptoms started 2 days ago. He reports generalized abdominal cramping, rectal cramping, and pain at his (known) fistula site. He states he has had ongoing issues with the fistula, has not had surgery for removal due to concern w/ regards to his underlying Crohn's. No drainage that he's appreciated. Pain is cramping, 6/10, located to the abdomen, rectum, and R perirectal fistula site. No radiation.  No alleviating or aggravating components.  He also reports associated increase in number of stools daily.  Has seen some blood mixed in the stool as well.  Normally has 3-4 bowel movements daily, but over the last 2 days feels like he has to move his bowels almost every time he eats.  Often has the sensation of incomplete bowel emptying.  He reports compliance with all of his Crohn's medications.   Past Medical Hx Past Medical History:  Diagnosis Date  . Anxiety   . Asthma    as a child  . Blood in stool   . Calculus of gallbladder without cholecystitis without obstruction 12/25/2015  . Crohn's disease ([place])   . Depression   . Exacerbation of Crohn's disease ([place]) 09/10/2017  . Gallbladder polyp 12/25/2015  . Gastroenteritis due to norovirus 05/03/2017  . Generalized abdominal pain   . H/O Clostridium difficile infection 05/03/2017  . Iron deficiency anemia 12/28/2016  . Iron deficiency anemia 12/28/2016  . Perirectal abscess 07/04/2015   Overview:  Added automatically from request for surgery  [phone]  . Rectal fistula   . Sepsis ([place]) 06/22/2016    Problem List Patient Active Problem List   Diagnosis Date Noted  . Crohn's colitis, unspecified complication ([place]) 57/26/2035  . Crohn's disease of anal canal ([place]) 11/29/2018  . Other long term (current) drug therapy 11/14/2018  . Crohn's disease of both small and large intestine with other complication ([place]) 59/74/1638  . Iron deficiency anemia 12/28/2016  . Osteoporosis 12/09/2016  . Perirectal fistula   . Fracture of metacarpal bone 06/09/2016  . Intestinal bypass or anastomosis status   . Mild episode of recurrent major depressive disorder ([place]) 04/02/2015  . Asthma, mild intermittent 04/02/2015    Past Surgical Hx Past Surgical History:  Procedure Laterality Date  . COLON RESECTION    . COLON SURGERY    . COLONOSCOPY N/A 11/30/2018   Procedure: COLONOSCOPY;  Surgeon: Lin Landsman, MD;  Location: [facility] ENDOSCOPY;  Service: Gastroenterology;  Laterality: N/A;  . COLONOSCOPY WITH PROPOFOL N/A 04/03/2015   Procedure: COLONOSCOPY WITH PROPOFOL;  Surgeon: Hulen Luster, MD;  Location: [facility] ENDOSCOPY;  Service: Endoscopy;  Laterality: N/A;  . COLONOSCOPY WITH PROPOFOL N/A 12/22/2015   Procedure: COLONOSCOPY WITH PROPOFOL;  Surgeon: [name], MD;  Location: [place];  Service: Endoscopy;  Laterality: N/A;  . COLONOSCOPY WITH PROPOFOL N/A 12/22/2016   Procedure: COLONOSCOPY WITH PROPOFOL;  Surgeon: Lin Landsman, MD;  Location: [place];  Service: Endoscopy;  Laterality: N/A;  . COLONOSCOPY WITH PROPOFOL N/A 09/14/2017   Procedure: COLONOSCOPY WITH PROPOFOL;  Surgeon: Lin Landsman, MD;  Location: [facility] ENDOSCOPY;  Service: Gastroenterology;  Laterality: N/A;  . ESOPHAGOGASTRODUODENOSCOPY (EGD) WITH PROPOFOL N/A 12/22/2016   Procedure: ESOPHAGOGASTRODUODENOSCOPY (EGD) WITH PROPOFOL;  Surgeon: Lin Landsman, MD;  Location: [place];  Service: Endoscopy;  Laterality: N/A;  .  rectal fistula surgery    . WRIST SURGERY      Medications Prior to Admission medications   Medication Sig Start Date End Date Taking? Authorizing Provider  albuterol (VENTOLIN HFA) 108 (90 Base) MCG/ACT inhaler Inhale 2 puffs into the lungs every 6 (six) hours as needed for wheezing.    [provider]  ARIPiprazole (ABILIFY) 5 MG tablet Take 5 mg by mouth daily. 12/15/18   [provider]  busPIRone (BUSPAR) 5 MG tablet Take 5 mg by mouth 2 (two) times daily. 02/14/19   [provider]  ciprofloxacin (CIPRO) 500 MG tablet Take 1 tablet (500 mg total) by mouth 2 (two) times daily. 03/20/19 04/19/19  Dhungel, Nishant, MD  escitalopram (LEXAPRO) 10 MG tablet Take 1 tablet (10 mg total) by mouth daily. 02/20/18   Ursula Alert, MD  feeding supplement, ENSURE ENLIVE, (ENSURE ENLIVE) LIQD Take 237 mLs by mouth 3 (three) times daily between meals. 03/20/19   Dhungel, Flonnie Overman, MD  hydrOXYzine (VISTARIL) 25 MG capsule Take 1 capsule (25 mg total) by mouth daily as needed (severe anxiety attacks). 07/15/17   Ursula Alert, MD  inFLIXimab (REMICADE) 100 MG injection Inject 100 mg into the vein every 8 (eight) weeks.    [provider]  loperamide (IMODIUM) 2 MG capsule Take by mouth 4 (four) times daily as needed. 03/20/19   [provider]  mesalamine (ROWASA) 4 g enema Place 60 mLs (4 g total) rectally at bedtime. 03/20/19   Dhungel, Nishant, MD  methotrexate (RHEUMATREX) 2.5 MG tablet TAKE 10 TABLETS (25 MG TOTAL) BY MOUTH ONCE A WEEK. CAUTION:CHEMOTHERAPY. PROTECT FROM LIGHT. 02/19/19 05/20/19  Lin Landsman, MD  metroNIDAZOLE (FLAGYL) 500 MG tablet Take 1 tablet (500 mg total) by mouth 2 (two) times daily. 03/20/19 04/19/19  Dhungel, Flonnie Overman, MD  predniSONE (DELTASONE) 20 MG tablet Take 1 tablet (20 mg total) by mouth daily with breakfast for 28 days. 03/20/19 04/17/19  Dhungel, Flonnie Overman, MD    Allergies Patient has no known allergies.  Family Hx Family History    Problem Relation Age of Onset  . Diabetes Paternal Grandfather   . Heart disease Paternal Grandfather   . Depression Mother   . Drug abuse Mother   . Osteoporosis Neg Hx     Social Hx Social History   Tobacco Use  . Smoking status: Never Smoker  . Smokeless tobacco: Former Network engineer Use Topics  . Alcohol use: Yes    Comment: once a month  . Drug use: No    Comment: hx of 3 year ago      Review of Systems  Constitutional: Negative for fever, chills. Eyes: Negative for visual changes. ENT: Negative for sore throat. Cardiovascular: Negative for chest pain. Respiratory: Negative for shortness of breath. Gastrointestinal: + abdominal pain, rectal pain Genitourinary: Negative for dysuria. Musculoskeletal: Negative for leg swelling. Skin: + R perirectal fistula Neurological: Negative for headaches.   Physical Exam  Vital Signs: ED Triage Vitals  Enc Vitals Group     BP 04/09/19 1044 136/77     Pulse Rate 04/09/19 1044 (!) 118     Resp 04/09/19 1044 18     Temp 04/09/19 1044 99.8 F (37.7 C)     Temp  Source 04/09/19 1044 Oral     SpO2 04/09/19 1044 97 %     Weight 04/09/19 1047 130 lb (59 kg)     Height 04/09/19 1047 5' 5" (1.651 m)     Head Circumference --      Peak Flow --      Pain Score 04/09/19 1047 6     Pain Loc --      Pain Edu? --      Excl. in [place]? --     Constitutional: Alert and oriented.  Head: Normocephalic. Atraumatic. Eyes: Conjunctivae clear. Sclera anicteric. Nose: No congestion. No rhinorrhea. Mouth/Throat: Wearing mask.  Neck: No stridor.   Cardiovascular: Tachycardic, regular rhythm. Extremities well perfused. Respiratory: Normal respiratory effort.  Lungs CTAB. Gastrointestinal: Soft. Non-tender. Non-distended.  Musculoskeletal: No lower extremity edema. No deformities. Neurologic:  Normal speech and language. No gross focal neurologic deficits are appreciated.  Skin: R perirectal fistula site with surrounding erythema and  induration. No drainage. No appreciable fluctuance.  Psychiatric: Mood and affect are appropriate for situation.   Radiology  CT: IMPRESSION:  1. Interval enlargement of right perirectal fistula. No evidence of  bowel obstruction or leak.  2. Stable postsurgical changes status post right hemicolectomy and  ileocolonic anastomosis. There is persistent wall thickening  throughout the sigmoid colon and rectum consistent with chronic  inflammation. Distal small bowel wall thickening appears mildly  improved.  3. Stable prominent mesenteric and retroperitoneal lymph nodes,  likely reactive.  4. Cholelithiasis without evidence of cholecystitis or biliary  dilatation.    Procedures  Procedure(s) performed (including critical care):  Procedures   Initial Impression / Assessment and Plan / ED Course  34 y.o. male who presents to the ED for abdominal pain, rectal pain, concern for perirectal fistula.  History of Crohn's disease  Ddx: Crohn's flare, recurrent/worsening perirectal fistula, abscess, colitis  Will evaluate with labs, imaging. Fluids/pain control.   CT imaging as above. Discussed with surgery. Patient would benefit from evaluation by sub-specialized colorectal surgery or GI surgery given his background Crohn's disease if he should desire fistulectomy/definitive surgical management. As he is afebrile, with normal WBC count, normal ESR, do not feel this represents acute infection or abscess requiring emergent surgery or necessitate transfer at this time. Patient HR and pain has improved, he will pursue further evaluation as outpatient. Given information for colorectal/GI surgery at multiple facilities per his request for varied options. Will provide short course Rx for pain control and also advised follow up with his GI. He is comfortable with this plan.    Final Clinical Impression(s) / ED Diagnosis  Final diagnoses:  Perirectal fistula       Note:  This document was  prepared using Dragon voice recognition software and may include unintentional dictation errors.   Lilia Pro., MD 04/09/19 [phone]

## 2019-04-09 NOTE — ED Notes (Signed)
ED Provider at bedside. 

## 2019-04-09 NOTE — ED Notes (Signed)
Assisted MD at bedside during rectal exam. Patient tolerated well.

## 2019-04-09 NOTE — Discharge Instructions (Addendum)
Thank you for letting us take care of you in the emergency department today.   Please continue to take any regular, prescribed medications.   Please follow up with: - Your GI doctor, Dr. [name]  Options for specialty surgery follow up:  [facility] - ask for colorectal surgery or GI surgery - [phone] [facility] - [phone] - [facility] - [phone]) or [phone])  Please return to the ER for any new or worsening symptoms.

## 2019-04-09 NOTE — ED Notes (Signed)
CT notified of patient being done with contrast

## 2019-04-10 ENCOUNTER — Other Ambulatory Visit: Payer: Self-pay

## 2019-04-10 ENCOUNTER — Ambulatory Visit: Payer: BC Managed Care – PPO | Admitting: Gastroenterology

## 2019-04-10 ENCOUNTER — Encounter: Payer: Self-pay | Admitting: Gastroenterology

## 2019-04-10 VITALS — BP 106/65 | HR 118 | Temp 98.5°F | Wt 136.4 lb

## 2019-04-10 DIAGNOSIS — Z8739 Personal history of other diseases of the musculoskeletal system and connective tissue: Secondary | ICD-10-CM | POA: Diagnosis not present

## 2019-04-10 DIAGNOSIS — K5010 Crohn's disease of large intestine without complications: Secondary | ICD-10-CM

## 2019-04-10 MED ORDER — MESALAMINE 4 G RE ENEM: 4.0000 g | ENEMA | Freq: Every day | RECTAL | 0 refills | Status: DC

## 2019-04-10 MED ORDER — PREDNISONE 10 MG PO TABS: ORAL_TABLET | ORAL | 0 refills | Status: DC

## 2019-04-10 NOTE — Patient Instructions (Signed)
Bone density scan is scheduled for March 9 at 1:00 breast care center at [facility]

## 2019-04-10 NOTE — Progress Notes (Signed)
we'll  [name], MD [address]  [place]  [place], [place] [address]  Main: [phone]  Fax: [phone]    Gastroenterology Consultation  Referring Provider:     [name], MD Primary Care Physician:  [name], MD Primary Gastroenterologist:  Dr. [name] Reason for Consultation:     Crohn's disease        HPI:   Leroy Hernandez is a 34 y.o. y/o male referred by Dr. Ouida Sills, [name], MD  for consultation & management of Crohn's disease. He has history of ileocolonic Crohn's diagnosed in 2007, detailed history described below is referred by [name] to establish care.   Follow-up visit 11/14/2018 Patient has not seen me in last 1 year.  He felt he did not need follow-up as he was doing good.  He continues to take Remicade 10 mg/KG body weight every 8 weeks.  He ran out of methotrexate about 3 weeks ago.  Over the last 2 weeks, he has been experiencing postprandial urgency noticing streaks of blood.  He had temporary drainage of the perianal fistula but stopped at present it was tender as well.  He no longer has tenderness or drainage of his fistula.  He denies abdominal pain, nausea or vomiting.  His weight has been stable.  He was seen by [facility] dermatology for evaluation of skin lesion on his left temple, confirmed squamous cell carcinoma in situ, stage I which was treated with Mohs surgery.  The scar looks completely healed.  He does not have a follow-up with dermatology yet.  He needs to see endocrinology for follow-up of osteoporosis.  He cancel his bone density testing and waiting to reschedule  Follow-up visit 12/26/2018 Patient was recently hospitalized to [facility] secondary to flareup of ileocolonic Crohn's resulting in diarrhea, rectal discomfort, rectal bleeding, weight loss.  Found to have elevated CRP and leukocytosis.  Significantly elevated fecal calprotectin at 600, stool studies negative for infection including C. difficile.   Started him on antibiotics, steroids.  Underwent colonoscopy which revealed active ileocolonic disease and worse in the rectum.  Discharged him on oral methotrexate 25 mg weekly along with daily folate, 2 weeks course of Cipro and Flagyl, prednisone taper, Rowasa enema along with high-dose Remicade.  Since discharge, patient reports that he feels back to normal.  Currently having up to 3 mushy, nonbloody bowel movements daily.  No longer experiencing rectal bleeding, rectal discomfort.  He reports that his appetite has picked up, able to eat regularly.  He is going back to work.  He is scheduled for next Remicade infusion at home on Friday this week  Follow-up visit 04/10/19 Patient has been going through flareup of Crohn's disease since end of January 2021 with symptoms including rectal pain, loss of appetite, nausea, emesis, diarrhea mixed with blood.  He was admitted to [facility] in 03/2019, treated with prednisone, antibiotics and Rowasa enema.  Stool studies were negative for infection.  Fecal calprotectin levels were elevated to 600s, elevated CRP.  He underwent CT abdomen and pelvis with contrast which did not reveal significant colitis or ileitis.  There was no evidence of perforation or abscess.  There was evidence of right perirectal fistula with no evidence of associated abscess on CT from 03/18/2019.  Patient reports that he was doing well until yesterday with return of rectal pain associated with diarrhea and minimal rectal bleeding.  He lost about 10 pounds since his first flareup.  He went to ER again  yesterday.  I offered him to see me on Thursday, however he could not wait.  He underwent repeat CT abdomen and pelvis with no evidence of worsening of underlying inflammation compared to CT from 2/7.  He did not have leukocytosis, CRP was significantly elevated at 15.2.  He was discharged home and was told to follow-up with GI.  Patient called my office today and requested an urgent appointment.  Patient  reports that he has been using nicotine dip daily for the last several months daily.  He was never a smoker in the past.   Crohn's disease classification:  Age: 48 to 60 Location: ileocolonic  Behavior: stricturing and penetrating  Perianal: Yes  IBD diagnosis:2007  Disease course: He was diagnosed in 2007, was experiencing right lower quadrant pain and bloating, was on oral mesalamine, several courses of prednisone, underwent first surgery in 2011 of transverse colon resection at [facility]. He continued to have diarrhea and required repeated courses of prednisone, established care at [facility] and underwent colonoscopy which revealed severe ileocolonic disease, underwent ileocolonic resection in 11/2011. He reports being started on Humira and Imuran since then and was doing well for some time. He was also taking marijuana and it significantly helped with his symptoms. Subsequently, he had frequent flareups, several hospitalizations and ER visits almost every month secondary to perianal fistula, abscess and bloody diarrhea. He was receiving short courses of antibiotics. He had I&D of right perirectal abscesses in May/2017. He does not recall having a seton placement. His colonoscopy in 03/2015 revealed mild active colitis and anastomotic stricture that was dilated with balloon to 12 mm. He underwent another colonoscopy in 12/2015 which revealed inflammation in the neoterminal ileum as well as colon and was found to have moderately active ileocolonic disease. He was then switched to [place] in 05/2016 and stayed on Imuran 150 mg daily. He was feeling better overall but continued to have active perianal disease resulting in urgency and bloody stools. CT A/P from 06/2016 revealed right perirectal fistula with developing fluid collection concerning for abscess. MRE from 09/2016 revealed no evidence of active disease, however the fistula could not be visualized. His CRP improved from 10 in 06/2016  -1.7 in 09/2016. 09/2016 Trial of cipro+flagyl BID and rowasa enema resulted in significant improvement of symptoms. 11/2016 Return of fistula and rectal bleeding after tapering antibiotic. 12/2016 Restarted cipro, entocort, cortenema, decreased imuran to 5m. EGD and colonoscopy revealed gastric granuloma, Rutgeert's i4. Stool studies negative. Fecal calprotectin 370. C. Difficile infection in 04/2017, treated with oral vancomycin. Flareup of Crohn's since 07/2017, took 4 weeks course of prednisone while on Remicade and methotrexate, mild improvement. Stool studies negative for infection including C. Difficile.  [facility], IV steroids, 2 weeks antibiotics, Rowasa enemas, colonoscopy showed progression of disease from i4 to i2. Remicade trough levels 2.4, undetectable antibodies.  Increased Remicade to 10 mg/kg, first maintenance dose in 11/2017.  Patient had a flareup of Crohn's colitis in 11/2018 when he was off methotrexate for about a month, requiring hospitalization.  Underwent colonoscopy in 11/2018 which revealed active ileocolonic disease, worse in the rectum.  Received prednisone, antibiotics, topical mesalamine, restarted methotrexate, continued high-dose Remicade.  Undetectable Remicade antibodies.  Therapeutic Remicade trough levels.  Crohn's exacerbation in 03/2019 secondary to usage of nicotine drip, with no evidence of flareup of perianal fistula based on imaging.  Flareup treated with antibiotics, Rowasa enema, prednisone.  Extra intestinal manifestations: Imaging revealed chronic sacroiliitis. He denies skin, eye manifestations,  history of osteoporosis  IBD surgical history: 2011 : Intestinal resection, transverse colon Diagnosis:  TRANSVERSE COLON AND PROXIMAL TRANSVERSE COLON RESECTION:  SEGMENTS OF BOWEL WITH MODERATE TO FOCALLY SEVERE CHRONIC ACTIVE COLITIS.  EXTENSIVE TRANSMURAL INFLAMMATION.  CRYTPITIS AND CRYPT ABSCESSES ARE NOTED.  THE RESECTION MARGINS APPEAR  CLEAR.  THERE IS NO EVIDENCE OF DYSPLASIA OR MALIGNANCY.  NINETEEN BENIGN LYMPH NODES. (0/19)  12/01/2011 ileocolonic resection at [facility] by Dr. Launa Flight - 9 cm TI, 5.5 cm long large bowel Diagnosis:  A:Ileum and colon, resection  -Segment of ileum and colon with moderate to severely active chronic  enteritis, mildly active chronic colitis, and intact anastomosis at distal end  of specimen  -Surgical margins are viable; mildly active chronic colitis is present at  distal margin  -Apparent stricture near ileocecal valve  -Inflammatory polyp near ileocecal valve  -Fibrous obliteration of appendiceal lumen  -Six unremarkable lymph nodes examined microscopically  -No granulomas, viral cytopathic effect, or dysplasia identified   07/04/2015-incision and drainage of right perirectal abscess Imaging:  MRE-09/21/2016 IMPRESSION: 1. Stable surgical changes from prior colon resection and ileal colonic anastomosis. No findings for active Crohn's disease. 2. No acute abdominal/pelvic findings, mass lesions or adenopathy.  CT A/P 06/22/2016 IMPRESSION: 1. The fistula between the right lateral aspect of the rectum and the skin is again identified. The portion of the fistula immediately beneath the skin is more prominent in caliber in the interval with central decreased attenuation worrisome for a developing fluid collection in the distal fistula immediately beneath the cutaneous surface. A developing abscess cannot be excluded. Recommend clinical correlation. 2. Fluid throughout the remaining colon consistent with history of diarrhea. No colonic or small bowel inflammation identified. The rectum is poorly evaluated due to lack of distention. 3. 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  SBFT none  Procedures: Colonoscopy 09/2009 Diagnosis:  TRANSVERSE COLON MUCOSA COLD BIOPSY:  - MODERATE CHRONIC ACTIVE COLITIS, CONSISTENT WITH  PATIENTS  HISTORY OF CROHNS DISEASE.  - NEGATIVE FOR DYSPLASIA AND MALIGNANCY.   Colonoscopy 06/2010 Diagnosis:  ULCERATED RIGHT SIDED COLON BIOPSY:  - MILD CHRONIC ACTIVE COLITIS, COMPATIBLE WITH PATIENTS KNOWN  HISTORY OF CROHNS DISEASE.  - NEGATIVE FOR DYSPLASIA AND MALIGNANCY.   Colonoscopies 07/2012 Diagnosis:  ANASTOMOTIC STRICTURE COLD BIOPSY:  - MILD CHRONIC ACTIVE COLITIS, COMPATIBLE WITH PATIENTS KNOWN  HISTORY OF CROHNS DISEASE.  - NEGATIVE FOR DYSPLASIA AND MALIGNANCY.   Colonoscopies 07/2013 Diagnosis:  ILEUM BIOPSY:  - SMALL BOWEL MUCOSA WITH PRESERVED VILLOUS ARCHITECTURE.  - NEGATIVE FOR INTRAEPITHELIAL LYMPHOCYTOSIS, DYSPLASIA AND  MALIGNANCY.   Colonoscopy 03/2015 Multiple ulcers in the sigmoid colon, no bleeding was present biopsies were taken, tight stricture at the ileocolonic anastomosis unable to get through scope, dilated with 12 mm TTS balloon. The terminal ileum appeared normal DIAGNOSIS:  A. SIGMOID COLON; COLD BIOPSY:  - MILD CHRONIC ACTIVE COLITIS.  - NEGATIVE FOR DYSPLASIA AND MALIGNANCY.   Colonoscopy 12/2015 There was evidence of prior end-to-end ileocolonic anastomosis in the transverse colon. This was characterized by mild stenosis. The anastomosis was traversed. Scattered inflammation, moderate in severity and characterized by shallow ulcerations was found in the proximal ileum. Biopsies were taken with the cold forceps for histology. Inflammation characterized by shallow ulcerations were found no sites were spared. This was moderate in severity. Biopsies were taken. Perianal fistula found on perianal exam. Pathology: Small intestine-severe chronic active ileitis with ulceration. Negative for dysplasia, negative for CMV on IP stain Transverse colon-moderate chronic active colitis, negative for  dysplasia, negative for CMV on IP stain Rectosigmoid:-Moderate chronic active colitis with nonnecrotizing epithelioid microgranuloma in the lamina propria,  negative for dysplasia, negative for CMV on IP stain  Colonoscopy 12/2016 - Perianal fistula found on perianal exam. - Patent functional end-to-end ileo-colonic anastomosis, characterized by mild stenosis. Dilated to 38m. - Ulcerated mucosa in the neo-terminal ileum. Biopsied. - Mucosal ulceration in colon. Biopsied. - Active ileocolonic Crohn's disease, Rutgeert's i4 on cimzia and imuran - The distal rectum and anal verge are normal on retroflexion view.  Upper Endoscopy 12/2016 - Normal second portion of the duodenum and third portion of the duodenum. Biopsied. - Multiple non-bleeding duodenal ulcers with a clean ulcer base (Forrest Class III). Biopsied. - Normal stomach. Biopsied. - Normal gastroesophageal junction and esophagus.   Colonoscopy 09/14/2017 Perianal fistula and perianal skin tag found on perianal exam. - Patent functional end-to-end ileo-colonic anastomosis, characterized by edema, erythema and friable mucosa. - A few ulcers in the neo-terminal ileum. Rutgeert's i2 improved from i4 disease on remicade - Congested and superficial ulcerated mucosa in the rectum and in the recto-sigmoid colon. Biopsied.  Colonoscopy 11/30/2018 - Patent end-to-side ileo-colonic anastomosis, characterized by inflammation. - Crohn's disease, with ileitis and colitis. Inflammation was found. This was graded as Rutgeerts Score i2 (more than five aphthous lesions or skip areas of larger lesions or lesions confined to the ileocolonic anastomosis), unchanged compared to previous examinations. Biopsied. - Crohn's disease with colonic involvement. Inflammation was found in the rectum, in the recto-sigmoid colon, in the sigmoid colon and in the ascending colon. This was mild in severity, worsened compared to previous examinations. Biopsied, to rule out CMV, HSV on rectal ulcers - Perianal fistula and perianal skin tag found on perianal exam.  DIAGNOSIS:  A. NEOTERMINAL ILEUM; COLD BIOPSY:  -  CHRONICALLY INFLAMED ILEOCOLONIC ANASTOMOSIS WITH MILD MUCOSAL  ACTIVITY.  - NEGATIVE FOR GRANULOMA, DYSPLASIA, AND MALIGNANCY.   B. COLON, RIGHT; COLD BIOPSY:  - CHRONIC COLITIS WITH MODERATE ACTIVITY (CRYPTITIS AND CRYPT  ABSCESSES).  - NEGATIVE FOR GRANULOMA, DYSPLASIA, AND MALIGNANCY.   C. COLON, LEFT; COLD BIOPSY:  - CHRONIC COLITIS WITH MODERATE ACTIVITY (CRYPTITIS AND CRYPT  ABSCESSES).  - NEGATIVE FOR GRANULOMA, DYSPLASIA, AND MALIGNANCY.   D. COLON, RECTOSIGMOID; COLD BIOPSY:  - CHRONIC PROCTO-COLITIS WITH SEVERE ACTIVITY (CRYPTITIS, CRYPT  ABSCESSES, ULCERATION, AND CRYPT DESTRUCTION).  - NEGATIVE FOR GRANULOMA, DYSPLASIA, AND MALIGNANCY.   E. RECTAL ULCER; COLD BIOPSY:  - CHRONIC PROCTITIS WITH SEVERE ACTIVITY (CRYPTITIS, CRYPT ABSCESSES,  ULCERATION, AND CRYPT DESTRUCTION).  - NEGATIVE FOR GRANULOMA, DYSPLASIA, AND MALIGNANCY.   ADDENDUM:  Immunohistochemical studies with appropriately reactive controls were  performed against HSV 1/2 and CMV for the rectosigmoid colon biopsy and  rectal ulcer (specimens D and E). These studies are negative.   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 Stopped Imuran in 01/2017 Methotrexate: 25 mg by mouth started in 02/2017 weekly along with daily folate  Biologics:  Anti TNFs: Humira until end of 2017, stopped due to recurrence of postoperative Crohn's. Cimzia started in 05/28/2016 along with Imuran. Cimzia stopped in 01/2017. Remicade 5 mg/kg started in 02/2017, maintenance every 8 weeks, infliximab trough levels 2.4ug/ml on 09/09/2017, antibodies less than 22, increased Remicade to 10 mg/kg in 11/2017, infliximab trough levels 22 with undetectable antibodies on 11/22/2018 Anti  Integrins:    Ustekinumab: Tofactinib: Clinical trial:   Past Medical History:  Diagnosis Date  . Anxiety   . Asthma    as a child  . Blood in stool   . Calculus of gallbladder without cholecystitis without obstruction 12/25/2015  . Crohn's disease ([place])   . Depression   . Exacerbation of Crohn's disease ([place]) 09/10/2017  . Gallbladder polyp 12/25/2015  . Gastroenteritis due to norovirus 05/03/2017  . Generalized abdominal pain   . H/O Clostridium difficile infection 05/03/2017  . Iron deficiency anemia 12/28/2016  . Iron deficiency anemia 12/28/2016  . Perirectal abscess 07/04/2015   Overview:  Added automatically from request for surgery [phone]  . Rectal fistula   . Sepsis ([place]) 06/22/2016    Past Surgical History:  Procedure Laterality Date  . COLON RESECTION    . COLON SURGERY    . COLONOSCOPY N/A 11/30/2018   Procedure: COLONOSCOPY;  Surgeon: Lin Landsman, MD;  Location: [facility] ENDOSCOPY;  Service: Gastroenterology;  Laterality: N/A;  . COLONOSCOPY WITH PROPOFOL N/A 04/03/2015   Procedure: COLONOSCOPY WITH PROPOFOL;  Surgeon: Hulen Luster, MD;  Location[facility] ENDOSCOPY;  Service: Endoscopy;  Laterality: N/A;  . COLONOSCOPY WITH PROPOFOL N/A 12/22/2015   Procedure: COLONOSCOPY WITH PROPOFOL;  Surgeon: [name], MD;  Location: [place];  Service: Endoscopy;  Laterality: N/A;  . COLONOSCOPY WITH PROPOFOL N/A 12/22/2016   Procedure: COLONOSCOPY WITH PROPOFOL;  Surgeon: Lin Landsman, MD;  Location: [place];  Service: Endoscopy;  Laterality: N/A;  . COLONOSCOPY WITH PROPOFOL N/A 09/14/2017   Procedure: COLONOSCOPY WITH PROPOFOL;  Surgeon: Lin Landsman, MD;  Location: [facility] ENDOSCOPY;  Service: Gastroenterology;  Laterality: N/A;  . ESOPHAGOGASTRODUODENOSCOPY (EGD) WITH PROPOFOL N/A 12/22/2016   Procedure: ESOPHAGOGASTRODUODENOSCOPY (EGD) WITH PROPOFOL;  Surgeon: Lin Landsman, MD;  Location: [place];  Service: Endoscopy;  Laterality:  N/A;  . rectal fistula surgery    . WRIST SURGERY       Current Outpatient Medications:  .  albuterol (VENTOLIN HFA) 108 (90 Base) MCG/ACT inhaler, Inhale 2 puffs into the lungs every 6 (six) hours as needed for wheezing., Disp: , Rfl:  .  ARIPiprazole (ABILIFY) 5 MG tablet, Take 5 mg by mouth daily., Disp: , Rfl:  .  busPIRone (BUSPAR) 5 MG tablet, Take 5 mg by mouth 2 (two) times daily., Disp: , Rfl:  .  escitalopram (LEXAPRO) 10 MG tablet, Take 1 tablet (10 mg total) by mouth daily., Disp: 90 tablet, Rfl: 1 .  feeding supplement, ENSURE ENLIVE, (ENSURE ENLIVE) LIQD, Take 237 mLs by mouth 3 (three) times daily between meals., Disp: 237 mL, Rfl: 12 .  hydrOXYzine (VISTARIL) 25 MG capsule, Take 1 capsule (25 mg total) by mouth daily as needed (severe anxiety attacks)., Disp: 90 capsule, Rfl: 1 .  inFLIXimab (REMICADE) 100 MG injection, Inject 100 mg into the vein every 8 (eight) weeks., Disp: , Rfl:  .  loperamide (IMODIUM) 2 MG capsule, Take by mouth 4 (four) times daily as needed., Disp: , Rfl:  .  mesalamine (ROWASA) 4 g enema, Place 60 mLs (4 g total) rectally at bedtime., Disp: 30 enema, Rfl: 0 .  methotrexate (RHEUMATREX) 2.5 MG tablet, TAKE 10 TABLETS (25 MG TOTAL) BY MOUTH ONCE A WEEK. CAUTION:CHEMOTHERAPY. PROTECT FROM LIGHT., Disp: 120 tablet, Rfl: 0 .  oxyCODONE-acetaminophen (PERCOCET) 5-325 MG tablet, Take 1 tablet by mouth every 6 (six) hours as needed for up to 3 days for severe pain., Disp: 12 tablet, Rfl:  0 .  predniSONE (DELTASONE) 10 MG tablet, Start 40 mg once a day for 1 week, then 30 mg daily for 1 week, then 20 mg daily for 1 week, then 10 mg daily for 1 week, Disp: 80 tablet, Rfl: 0   Family History  Problem Relation Age of Onset  . Diabetes Paternal Grandfather   . Heart disease Paternal Grandfather   . Depression Mother   . Drug abuse Mother   . Osteoporosis Neg Hx      Social History   Tobacco Use  . Smoking status: Never Smoker  . Smokeless tobacco:  Former Network engineer Use Topics  . Alcohol use: Yes    Comment: once a month  . Drug use: No    Comment: hx of 3 year ago     Allergies as of 04/10/2019  . (No Known Allergies)    Review of Systems:    All systems reviewed and negative except where noted in HPI.   Physical Exam:  BP 106/65 (BP Location: Left Arm, Patient Position: Sitting, Cuff Size: Normal)   Pulse (!) 118   Temp 98.5 F (36.9 C) (Oral)   Wt 136 lb 6 oz (61.9 kg)   BMI 22.69 kg/m  No LMP for male patient. Filed Weights   04/10/19 1050  Weight: 136 lb 6 oz (61.9 kg)   General:   Alert, well-built, well-nourished, pleasant and cooperative in NAD Head:  Normocephalic and atraumatic. Eyes:  Sclera clear, no icterus.   Conjunctiva pale. Ears:  Normal auditory acuity. Nose:  No deformity, discharge, or lesions. Mouth:  No deformity or lesions,oropharynx pink & moist. Neck:  Supple; no masses or thyromegaly. Lungs:  Respirations even and unlabored.  Clear throughout to auscultation.   No wheezes, crackles, or rhonchi. No acute distress. Heart:  Regular rate and rhythm; no murmurs, clicks, rubs, or gallops. Abdomen:  Normal bowel sounds.  No bruits.  Soft, non-tender nondistended without masses, hepatosplenomegaly or hernias noted.  No guarding or rebound tenderness.  Midline vertical scar from prior surgery Rectal: Perianal fistula mildly tender, there was no evidence of fluctuant abscess Msk:  Symmetrical without gross deformities. Good, equal movement & strength bilaterally. Pulses:  Normal pulses noted. Extremities:  No clubbing or edema.  No cyanosis. Neurologic:  Alert and oriented x3;  grossly normal neurologically. Skin:  Intact without significant lesions or rashes. No jaundice, well-healed scar in the left temple. Psych:  Alert and cooperative. Normal mood and affect.   Assessment and Plan:   Leroy Hernandez is a 34 y.o. caucasian male with Ileocolonic Crohn's disease diagnosed in 2007,  stricturing and penetrating with perianal fistula, previously on Humira and Imuran, recurrence of postoperative Crohn's, resulted in discontinuation of Humira. Switched to [place] in 05/2016 biweekly and continued Imuran 150 mg daily which resulted in clinical remission followed by recurrence of luminal disease and perianal fistula that responded to antibiotics, then recurrence of symptoms after tapering abx, responded to entocort, EGD and colonoscopy 12/2016 revealed gastric crohn's and post op recurrence of Crohn's Rutgeert's i4, anastomotic stricture and status post dilation. Switched to Remicade and methotrexate.  Temporarily in clinical remission, episode of C. Difficile treated with oral vancomycin, followed by flareup, currently on high-dose Remicade along with weekly methotrexate, recent exacerbation since 03/2019 probably secondary to initiation of nicotine dip  Exacerbation of Crohn's disease: Elevated fecal calprotectin levels and CRP 1. Continue high-dose Remicade, home infusion, maintenance at 8 weeks interval, no evidence of infliximab antibodies, infliximab drug levels  are at therapeutic level 2. Continue methotrexate 25 mg weekly with daily folic acid 1 mg 3.  Recheck stool studies to rule out C. difficile infection 4.  Start prednisone 40 mg daily with taper, start Rowasa enema daily 5.  Strongly advised to stop nicotine dip 6.  Recheck labs in 1 month, if there is no improvement clinically and based on labs, will recheck infliximab trough levels and antibodies, repeat colonoscopy  IBD Health Maintenance  1.TB status: PPD skin test negative as of October, 2017, quantiferon gold Negative, checked on 07/20/2017. QuantiFERON gold indeterminate on 12/26/2018, will repeat when patient is off prednisone 2. Anemia: had iron deficiency anemia secondary to active Crohn's disease, resolved. Received parenteral iron infusions, 2, vitamin B12 is 397. Last ferritin level 114 in  07/2017 3.Immunizations: Hep A and B immune, received Twinrix vaccine x 3 doses. Annual influenza vaccine up-to-date, received Prevnar in 2019 and pneumovax in 11/2018, received Shingrix vaccine 12/26/2018, recommend booster in 06/2019 4.Cancer screening I) Colon cancer/dysplasia surveillance: Last colonoscopy in 11/2018, no dysplasia II) Cervical cancer: N/A III) Skin cancer -SCC I-S, stage I in left temple, status post Mohs surgery.  Counseled about annual skin exam by dermatology and skin protection in summer using sun screen SPF > 50, clothing 5.Bone health: Has osteoporosis, being followed by endocrine Vitamin D status: Normal as of 03/16/2018 Bone density testing: DEXA scan 09/29/2016 revealed osteoporosis, Z-score -3.3 Repeat bone density testing 5. Labs: Recheck labs every 16month 6. Smoking: Advised to quit nicotine dip 7. NSAIDs and Antibiotics use: No history of NSAID use  Follow up in 360month    RoCephas DarbyMD

## 2019-04-11 ENCOUNTER — Other Ambulatory Visit: Payer: Self-pay | Admitting: Gastroenterology

## 2019-04-12 ENCOUNTER — Ambulatory Visit: Payer: BC Managed Care – PPO | Admitting: Gastroenterology

## 2019-04-15 ENCOUNTER — Encounter: Payer: Self-pay | Admitting: Gastroenterology

## 2019-04-15 DIAGNOSIS — K50113 Crohn's disease of large intestine with fistula: Secondary | ICD-10-CM

## 2019-04-15 DIAGNOSIS — K5080 Crohn's disease of both small and large intestine without complications: Secondary | ICD-10-CM

## 2019-04-16 ENCOUNTER — Telehealth: Payer: Self-pay

## 2019-04-16 ENCOUNTER — Telehealth: Payer: Self-pay | Admitting: Gastroenterology

## 2019-04-16 DIAGNOSIS — K50113 Crohn's disease of large intestine with fistula: Secondary | ICD-10-CM

## 2019-04-16 LAB — GI PROFILE, STOOL, PCR

## 2019-04-16 MED ORDER — METHOTREXATE 2.5 MG PO TABS: 25.0000 mg | ORAL_TABLET | ORAL | 0 refills | Status: AC

## 2019-04-16 MED ORDER — FOLIC ACID 1 MG PO TABS: 1.0000 mg | ORAL_TABLET | Freq: Every day | ORAL | 1 refills | Status: AC

## 2019-04-16 MED ORDER — METRONIDAZOLE 500 MG PO TABS: 500.0000 mg | ORAL_TABLET | Freq: Two times a day (BID) | ORAL | 0 refills | Status: AC

## 2019-04-16 MED ORDER — CIPROFLOXACIN HCL 500 MG PO TABS: 500.0000 mg | ORAL_TABLET | Freq: Two times a day (BID) | ORAL | 0 refills | Status: AC

## 2019-04-16 NOTE — Telephone Encounter (Signed)
-----   Message from Lin Landsman, MD sent at 04/16/2019  4:00 PM EST ----- Regarding: Urgent referral Please refer him to [facility] colorectal surgery, Dr. Victorio Palm, urgentDiagnosis: Perianal Crohn's, perirectal fistula, recent flareup  Thanks RV

## 2019-04-16 NOTE — Telephone Encounter (Signed)
Did referral to [place] surgery. Placed urgent

## 2019-04-16 NOTE — Telephone Encounter (Signed)
Returned patient's call in response to his [employer]. His main concern is rectal urgency and pain, having urge to have a BM, significant strain but scant stool comes out.  He is unable to tolerate Rowasa enema.  Stools are loose to formed, no blood, appetite is improving. Due for remicade today. Asking for refill on MTX as he misplaced the pill bottle and can't find it.  Is currently on prednisone 40 mg daily, will start taper tomorrow.  He is also requesting antibiotics.  Recent CT revealed enlargement of right perirectal fistula with no evidence of obstruction or leak  My recommendations are 1.Check infliximab trough levels and antibodies today before the infusion, called Mattie Marlin at [phone], RN who will be administering the medication 2.Start Cipro 500 mg twice daily, Flagyl 500 mg twice daily 3.Continue prednisone with taper as directed 4.We will place urgent referral to see colorectal surgery, Dr. Victorio Palm to evaluate perirectal fistula 5.Patient also informed that he got referral from Dr. Ouida Sills to [place] specialist as requested by his fiance.  Patient did not hear back from [facility] yet.  I have asked him to inform me once he is scheduled.  Will refill methotrexate 25 mg weekly with oral folate daily  Patient expressed understanding of the plan  [name], MD [address]  [place]  [place], [place] [address]  Main: [phone]  Fax: [phone] Pager: [phone]

## 2019-04-16 NOTE — Telephone Encounter (Signed)
Returned patient's call in response to his [employer]. His main concern is rectal urgency and pain, having urge to have a BM, significant strain but scant stool comes out.  He is unable to tolerate Rowasa enema.  Stools are loose to formed, no blood, appetite is improving. Due for remicade today. Asking for refill on MTX as he misplaced the pill bottle and can't find it.  Is currently on prednisone 40 mg daily, will start taper tomorrow.  He is also requesting antibiotics.  Recent CT revealed enlargement of right perirectal fistula with no evidence of obstruction or leak  My recommendations are 1.Check infliximab trough levels and antibodies today before the infusion, called Mattie Marlin at [phone], RN who will be administering the medication 2.Start Cipro 500 mg twice daily, Flagyl 500 mg twice daily 3.Continue prednisone with taper as directed 4.We will place urgent referral to see colorectal surgery, Dr. Victorio Palm to evaluate perirectal fistula 5.Patient also informed that he got referral from Dr. Ouida Sills to [place] specialist as requested by his fiance.  Patient did not hear back from [facility] yet.  I have asked him to inform me once he is scheduled.  Will refill methotrexate 25 mg weekly with oral folate daily  Patient expressed understanding of the plan  [name], MD [address]  [place]  [place], [place] [address]  Main: [phone]  Fax: [phone] Pager: [phone]

## 2019-04-17 ENCOUNTER — Telehealth: Payer: Self-pay | Admitting: Endocrinology

## 2019-04-17 ENCOUNTER — Ambulatory Visit
Admission: RE | Admit: 2019-04-17 | Discharge: 2019-04-17 | Disposition: A | Discharge status: Home / Self Care | Payer: BC Managed Care – PPO | Source: Ambulatory Visit | Attending: Gastroenterology | Admitting: Gastroenterology

## 2019-04-17 ENCOUNTER — Telehealth: Payer: Self-pay

## 2019-04-17 DIAGNOSIS — K5010 Crohn's disease of large intestine without complications: Secondary | ICD-10-CM | POA: Diagnosis present

## 2019-04-17 DIAGNOSIS — Z8739 Personal history of other diseases of the musculoskeletal system and connective tissue: Secondary | ICD-10-CM | POA: Diagnosis present

## 2019-04-17 NOTE — Telephone Encounter (Signed)
please contact patient: F/u ov is due

## 2019-04-17 NOTE — Telephone Encounter (Signed)
Joeann a Therapist, sports with [facility] is calling because they did a lab draw yesterday before the infusion per Dr. [name] orders. She states she went to [facility] and got instructions on how to do it. This morning she got a call from [facility] saying that the blood has to be frozen and spun. They are unable to do this since it is home health.  CB [phone]

## 2019-04-17 NOTE — Telephone Encounter (Signed)
Resent referral to [facility] because they state they did not received this

## 2019-04-18 ENCOUNTER — Telehealth: Payer: Self-pay

## 2019-04-18 NOTE — Telephone Encounter (Signed)
LMTCB to schedule appointment °

## 2019-04-18 NOTE — Telephone Encounter (Signed)
Called and left a detail message explaining  this to patient

## 2019-04-18 NOTE — Telephone Encounter (Signed)
Please refer to Dr. Cordelia Pen response below. LVM requesting returned call.

## 2019-04-18 NOTE — Telephone Encounter (Signed)
Patient has appointment with [facility] for colorectal surgery with Dr. Victorio Palm on 05/09/2019

## 2019-04-18 NOTE — Telephone Encounter (Signed)
Please inform pt and tell him he should not miss the appointment   Thanks RV

## 2019-04-23 NOTE — Telephone Encounter (Signed)
LMTCB x 2 to schedule appointment

## 2019-04-24 ENCOUNTER — Encounter: Payer: Self-pay | Admitting: Emergency Medicine

## 2019-04-24 ENCOUNTER — Emergency Department
Admission: EM | Admit: 2019-04-24 | Discharge: 2019-04-24 | Disposition: A | Discharge status: Other Acute Inpatient Hospital | Payer: BC Managed Care – PPO | Attending: Emergency Medicine | Admitting: Emergency Medicine

## 2019-04-24 ENCOUNTER — Other Ambulatory Visit: Payer: Self-pay

## 2019-04-24 ENCOUNTER — Emergency Department: Payer: BC Managed Care – PPO

## 2019-04-24 ENCOUNTER — Encounter: Payer: Self-pay | Admitting: Endocrinology

## 2019-04-24 DIAGNOSIS — K50913 Crohn's disease, unspecified, with fistula: Secondary | ICD-10-CM | POA: Diagnosis not present

## 2019-04-24 DIAGNOSIS — K605 Anorectal fistula: Secondary | ICD-10-CM | POA: Insufficient documentation

## 2019-04-24 DIAGNOSIS — K6289 Other specified diseases of anus and rectum: Secondary | ICD-10-CM | POA: Diagnosis present

## 2019-04-24 DIAGNOSIS — Z20822 Contact with and (suspected) exposure to covid-19: Secondary | ICD-10-CM | POA: Insufficient documentation

## 2019-04-24 DIAGNOSIS — K604 Rectal fistula: Secondary | ICD-10-CM

## 2019-04-24 LAB — URINALYSIS, COMPLETE (UACMP) WITH MICROSCOPIC
Bacteria, UA: NONE SEEN
Bilirubin Urine: NEGATIVE
Glucose, UA: NEGATIVE mg/dL
Hgb urine dipstick: NEGATIVE
Ketones, ur: NEGATIVE mg/dL
Leukocytes,Ua: NEGATIVE
Nitrite: NEGATIVE
Protein, ur: NEGATIVE mg/dL
Specific Gravity, Urine: 1.011 (ref 1.005–1.030)
Squamous Epithelial / HPF: NONE SEEN (ref 0–5)
pH: 7 (ref 5.0–8.0)

## 2019-04-24 LAB — COMPREHENSIVE METABOLIC PANEL
ALT: 25 U/L (ref 0–44)
AST: 23 U/L (ref 15–41)
Albumin: 3.6 g/dL (ref 3.5–5.0)
Alkaline Phosphatase: 63 U/L (ref 38–126)
Anion gap: 10 (ref 5–15)
BUN: 13 mg/dL (ref 6–20)
CO2: 23 mmol/L (ref 22–32)
Calcium: 9.2 mg/dL (ref 8.9–10.3)
Chloride: 104 mmol/L (ref 98–111)
Creatinine, Ser: 0.92 mg/dL (ref 0.61–1.24)
GFR calc Af Amer: >60 mL/min (ref >60)
GFR calc non Af Amer: >60 mL/min (ref >60)
Glucose, Bld: 126 mg/dL — ABNORMAL HIGH (ref 70–99)
Potassium: 4.5 mmol/L (ref 3.5–5.1)
Sodium: 137 mmol/L (ref 135–145)
Total Bilirubin: 0.5 mg/dL (ref 0.3–1.2)
Total Protein: 7.5 g/dL (ref 6.5–8.1)

## 2019-04-24 LAB — CBC WITH DIFFERENTIAL/PLATELET
Abs Immature Granulocytes: 0.34 10*3/uL — ABNORMAL HIGH (ref 0.00–0.07)
Basophils Absolute: 0.1 10*3/uL (ref 0.0–0.1)
Basophils Relative: 0 %
Eosinophils Absolute: 0 10*3/uL (ref 0.0–0.5)
Eosinophils Relative: 0 %
HCT: 42.4 % (ref 39.0–52.0)
Hemoglobin: 13.6 g/dL (ref 13.0–17.0)
Immature Granulocytes: 2 %
Lymphocytes Relative: 4 %
Lymphs Abs: 0.8 10*3/uL (ref 0.7–4.0)
MCH: 26.1 pg (ref 26.0–34.0)
MCHC: 32.1 g/dL (ref 30.0–36.0)
MCV: 81.2 fL (ref 80.0–100.0)
Monocytes Absolute: 0.2 10*3/uL (ref 0.1–1.0)
Monocytes Relative: 1 %
Neutro Abs: 18 10*3/uL — ABNORMAL HIGH (ref 1.7–7.7)
Neutrophils Relative %: 93 %
Platelets: 332 10*3/uL (ref 150–400)
RBC: 5.22 MIL/uL (ref 4.22–5.81)
RDW: 14.6 % (ref 11.5–15.5)
WBC: 19.4 10*3/uL — ABNORMAL HIGH (ref 4.0–10.5)
nRBC: 0 % (ref 0.0–0.2)

## 2019-04-24 LAB — LACTIC ACID, PLASMA
Lactic Acid, Venous: 2.8 mmol/L (ref 0.5–1.9)
Lactic Acid, Venous: 2.5 mmol/L (ref 0.5–1.9)

## 2019-04-24 LAB — RESPIRATORY PANEL BY RT PCR (FLU A&B, COVID)
Influenza A by PCR: NEGATIVE
Influenza B by PCR: NEGATIVE
SARS Coronavirus 2 by RT PCR: NEGATIVE

## 2019-04-24 MED ORDER — PIPERACILLIN-TAZOBACTAM 3.375 G IVPB 30 MIN
3.3750 g | Freq: Once | INTRAVENOUS | Status: AC
  Administered 2019-04-24: 3.375 g via INTRAVENOUS
  Filled 2019-04-24: qty 50

## 2019-04-24 MED ORDER — VANCOMYCIN HCL IN DEXTROSE 1-5 GM/200ML-% IV SOLN
1000.0000 mg | Freq: Once | INTRAVENOUS | Status: AC
  Administered 2019-04-24: 1000 mg via INTRAVENOUS
  Filled 2019-04-24: qty 200

## 2019-04-24 MED ORDER — METRONIDAZOLE IN NACL 5-0.79 MG/ML-% IV SOLN
500.0000 mg | Freq: Once | INTRAVENOUS | Status: AC
  Administered 2019-04-24: 500 mg via INTRAVENOUS
  Filled 2019-04-24: qty 100

## 2019-04-24 MED ORDER — HYDROMORPHONE HCL 1 MG/ML IJ SOLN
1.0000 mg | Freq: Once | INTRAMUSCULAR | Status: AC
  Administered 2019-04-24: 1 mg via INTRAVENOUS
  Filled 2019-04-24: qty 1

## 2019-04-24 MED ORDER — SODIUM CHLORIDE 0.9 % IV SOLN: Freq: Once | INTRAVENOUS | Status: AC

## 2019-04-24 MED ORDER — SODIUM CHLORIDE 0.9 % IV SOLN
2.0000 g | Freq: Once | INTRAVENOUS | Status: AC
  Administered 2019-04-24: 2 g via INTRAVENOUS
  Filled 2019-04-24: qty 2

## 2019-04-24 MED ORDER — SODIUM CHLORIDE 0.9 % IV BOLUS (SEPSIS)
1000.0000 mL | Freq: Once | INTRAVENOUS | Status: AC
  Administered 2019-04-24: 1000 mL via INTRAVENOUS

## 2019-04-24 MED ORDER — SODIUM CHLORIDE 0.9% FLUSH
3.0000 mL | Freq: Once | INTRAVENOUS | Status: AC
  Administered 2019-04-24: 3 mL via INTRAVENOUS

## 2019-04-24 MED ORDER — IOHEXOL 300 MG/ML  SOLN
100.0000 mL | Freq: Once | INTRAMUSCULAR | Status: AC | PRN
  Administered 2019-04-24: 100 mL via INTRAVENOUS

## 2019-04-24 NOTE — ED Triage Notes (Signed)
Pt presents to ED via POV with c/o pain from fistula to R buttocks. From chart review pt with hx of perirectal fistula, pt states when he was seen last they wanted to send him to [facility] however he declined, pt states pain has been increasing. Pt states fistula is draining "a little bit", states it is bright red and bloody. Pt states is currently on abx for infection.   Pt states has appt with gen surg at [facility] on 3/31 however pain has progressed to the point he is unable to wait.

## 2019-04-24 NOTE — ED Provider Notes (Addendum)
[facility] Emergency Department Provider Note       Time seen: ----------------------------------------- 2:05 PM on 04/24/2019 -----------------------------------------   I have reviewed the triage vital signs and the nursing notes.  HISTORY   Chief Complaint Abscess    HPI Leroy Hernandez is a 34 y.o. male with a history of anxiety, asthma, Crohn's disease, perirectal abscess, rectal fistula who presents to the ED for rectal pain.  Reportedly he is having pain from the fistula to his right buttocks area.  He has an appointment in 2 weeks with a colorectal surgeon at [facility].  He reports the pain has been increasing, currently he is taking antibiotics and steroids.  He states his fistula is draining a little bit, is bright red and bloody.  Past Medical History:  Diagnosis Date  . Anxiety   . Asthma    as a child  . Blood in stool   . Calculus of gallbladder without cholecystitis without obstruction 12/25/2015  . Crohn's disease ([place])   . Depression   . Exacerbation of Crohn's disease ([place]) 09/10/2017  . Gallbladder polyp 12/25/2015  . Gastroenteritis due to norovirus 05/03/2017  . Generalized abdominal pain   . H/O Clostridium difficile infection 05/03/2017  . Iron deficiency anemia 12/28/2016  . Iron deficiency anemia 12/28/2016  . Perirectal abscess 07/04/2015   Overview:  Added automatically from request for surgery [id number]  . Rectal fistula   . Sepsis ([place]) 06/22/2016    Patient Active Problem List   Diagnosis Date Noted  . Crohn's colitis, unspecified complication ([place]) 11/29/1171  . Crohn's disease of anal canal ([place]) 11/29/2018  . Other long term (current) drug therapy 11/14/2018  . Crohn's disease of both small and large intestine with other complication ([place]) 56/70/1410  . Iron deficiency anemia 12/28/2016  . Osteoporosis 12/09/2016  . Perirectal fistula   . Fracture of metacarpal bone 06/09/2016  . Intestinal bypass or anastomosis status    . 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 Asthma, mild intermittent 04/02/2015    Past Surgical History:  Procedure Laterality Date  . COLON RESECTION    . COLON SURGERY    . COLONOSCOPY N/A 11/30/2018   Procedure: COLONOSCOPY;  Surgeon: Lin Landsman, MD;  Location: [facility] ENDOSCOPY;  Service: Gastroenterology;  Laterality: N/A;  . COLONOSCOPY WITH PROPOFOL N/A 04/03/2015   Procedure: COLONOSCOPY WITH PROPOFOL;  Surgeon: Hulen Luster, MD;  Location: [facility] ENDOSCOPY;  Service: Endoscopy;  Laterality: N/A;  . COLONOSCOPY WITH PROPOFOL N/A 12/22/2015   Procedure: COLONOSCOPY WITH PROPOFOL;  Surgeon: [name], MD;  Location: [place];  Service: Endoscopy;  Laterality: N/A;  . COLONOSCOPY WITH PROPOFOL N/A 12/22/2016   Procedure: COLONOSCOPY WITH PROPOFOL;  Surgeon: Lin Landsman, MD;  Location: [place];  Service: Endoscopy;  Laterality: N/A;  . COLONOSCOPY WITH PROPOFOL N/A 09/14/2017   Procedure: COLONOSCOPY WITH PROPOFOL;  Surgeon: Lin Landsman, MD;  Location: [facility] ENDOSCOPY;  Service: Gastroenterology;  Laterality: N/A;  . ESOPHAGOGASTRODUODENOSCOPY (EGD) WITH PROPOFOL N/A 12/22/2016   Procedure: ESOPHAGOGASTRODUODENOSCOPY (EGD) WITH PROPOFOL;  Surgeon: Lin Landsman, MD;  Location: [place];  Service: Endoscopy;  Laterality: N/A;  . rectal fistula surgery    . WRIST SURGERY      Allergies Patient has no known allergies.  Social History Social History   Tobacco Use  . Smoking status: Never Smoker  . Smokeless tobacco: Former Network engineer Use Topics  . Alcohol use: Yes  Comment: once a month  . Drug use: No    Comment: hx of 3 year ago     Review of Systems Constitutional: Negative for fever. Cardiovascular: Negative for chest pain. Respiratory: Negative for shortness of breath. Gastrointestinal: Negative for abdominal pain, vomiting and diarrhea. Rectal: Positive for fistula  drainage Musculoskeletal: Negative for back pain. Skin: Negative for rash. Neurological: Negative for headaches, focal weakness or numbness.  All systems negative/normal/unremarkable except as stated in the HPI  ____________________________________________   PHYSICAL EXAM:  VITAL SIGNS: ED Triage Vitals  Enc Vitals Group     BP 04/24/19 1200 (!) 125/92     Pulse Rate 04/24/19 1200 (!) 112     Resp 04/24/19 1200 20     Temp 04/24/19 1200 98.4 F (36.9 C)     Temp Source 04/24/19 1200 Oral     SpO2 04/24/19 1200 99 %     Weight 04/24/19 1201 150 lb (68 kg)     Height 04/24/19 1201 5' 5"  (1.651 m)     Head Circumference --      Peak Flow --      Pain Score 04/24/19 1201 7     Pain Loc --      Pain Edu? --      Excl. in [place]? --     Constitutional: Alert and oriented.  Mild distress Eyes: Conjunctivae are normal. Normal extraocular movements. Cardiovascular: Rapid rate, regular rhythm. No murmurs, rubs, or gallops. Respiratory: Normal respiratory effort without tachypnea nor retractions. Breath sounds are clear and equal bilaterally. No wheezes/rales/rhonchi. Gastrointestinal: Soft and nontender. Normal bowel sounds Rectal: Chronic appearing fistula in the right perirectal region.  No obvious purulence or drainage at this time.  Perirectal tenderness is noted Musculoskeletal: Nontender with normal range of motion in extremities. No lower extremity tenderness nor edema. Neurologic:  Normal speech and language. No gross focal neurologic deficits are appreciated.  Skin:  Skin is warm, dry and intact. No rash noted. Psychiatric: Mood and affect are normal. Speech and behavior are normal.  ____________________________________________  ED COURSE:  As part of my medical decision making, I reviewed the following data within the [place] History obtained from family if available, nursing notes, old chart and ekg, as well as notes from prior ED visits. Patient  presented for rectal pain and likely fistula with abscess formation, we will assess with labs and imaging as indicated at this time.   Procedures  Leroy Hernandez was evaluated in Emergency Department on 04/24/2019 for the symptoms described in the history of present illness. He was evaluated in the context of the global COVID-19 pandemic, which necessitated consideration that the patient might be at risk for infection with the SARS-CoV-2 virus that causes COVID-19. Institutional protocols and algorithms that pertain to the evaluation of patients at risk for COVID-19 are in a state of rapid change based on information released by regulatory bodies including the CDC and federal and state organizations. These policies and algorithms were followed during the patient's care in the ED.  ____________________________________________   LABS (pertinent positives/negatives)  Labs Reviewed  LACTIC ACID, PLASMA - Abnormal; Notable for the following components:      Result Value   Lactic Acid, Venous 2.8 (*)    All other components within normal limits  LACTIC ACID, PLASMA - Abnormal; Notable for the following components:   Lactic Acid, Venous 2.5 (*)    All other components within normal limits  COMPREHENSIVE METABOLIC PANEL - Abnormal; Notable  for the following components:   Glucose, Bld 126 (*)    All other components within normal limits  CBC WITH DIFFERENTIAL/PLATELET - Abnormal; Notable for the following components:   WBC 19.4 (*)    Neutro Abs 18.0 (*)    Abs Immature Granulocytes 0.34 (*)    All other components within normal limits  URINALYSIS, COMPLETE (UACMP) WITH MICROSCOPIC - Abnormal; Notable for the following components:   Color, Urine YELLOW (*)    APPearance CLEAR (*)    All other components within normal limits  CULTURE, BLOOD (ROUTINE X 2)  CULTURE, BLOOD (ROUTINE X 2)  RESPIRATORY PANEL BY RT PCR (FLU A&B, COVID)   CRITICAL CARE Performed by: Laurence Aly   Total  critical care time: 30 minutes  Critical care time was exclusive of separately billable procedures and treating other patients.  Critical care was necessary to treat or prevent imminent or life-threatening deterioration.  Critical care was time spent personally by me on the following activities: development of treatment plan with patient and/or surrogate as well as nursing, discussions with consultants, evaluation of patient's response to treatment, examination of patient, obtaining history from patient or surrogate, ordering and performing treatments and interventions, ordering and review of laboratory studies, ordering and review of radiographic studies, pulse oximetry and re-evaluation of patient's condition.  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  Increased amount of low-density material accumulated along the  anterior margin and right side of the anorectal junction, likely  representing pus. This now measures up to 1.5 cm in diameter.  Previously it was barely detectable.  ____________________________________________   DIFFERENTIAL DIAGNOSIS   Perirectal abscess, fistula, Crohn's disease, sepsis, dehydration, electrolyte abnormality  FINAL ASSESSMENT AND PLAN  Perirectal fistula, possible perirectal abscess, systemic inflammatory response   Plan: The patient had presented for perirectal pain and likely abscess formation from known fistula. Patient's labs revealed significant leukocytosis with lactic acid elevation. 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  There is likely abscess at the anorectal junction.  He is exhibiting symptoms of systemic inflammatory response.  We are giving 2 L of IV fluid as well as vancomycin and Zosyn.  Patient requested transfer to [facility].  I discussed with colorectal surgery at [facility] and  they have accepted him via ER to ER transfer.  He appears medically stable at this time.   Laurence Aly, MD    Note: This note was generated in part or whole with voice recognition software. Voice recognition is usually quite accurate but there are transcription errors that can and very often do occur. I apologize for any typographical errors that were not detected and corrected.     Earleen Newport, MD 04/24/19 1511    Earleen Newport, MD 04/24/19 [phone]

## 2019-04-24 NOTE — Consult Note (Signed)
PHARMACY -  BRIEF ANTIBIOTIC NOTE   Pharmacy has received consult(s) for Cefepime from an ED provider.  The patient's profile has been reviewed for ht/wt/allergies/indication/available labs.    One time order(s) placed for Cefepime 2g IV x 1 (and metronidazole 586m IV x 1)  Further antibiotics/pharmacy consults should be ordered by admitting physician if indicated.                       Thank you,  CLu Duffel PharmD, BCPS Clinical Pharmacist 04/24/2019 1:17 PM

## 2019-04-24 NOTE — ED Notes (Signed)
[place]

## 2019-04-24 NOTE — Telephone Encounter (Signed)
Letter mailed for fu No further attempts

## 2019-04-24 NOTE — ED Notes (Signed)
PT  WENT  TO  [facility]  ED  ALL  PAPERWORK  SENT  WITH  [facility]  [place]

## 2019-04-24 NOTE — Consult Note (Signed)
CODE SEPSIS - PHARMACY COMMUNICATION  **Broad Spectrum Antibiotics should be administered within 1 hour of Sepsis diagnosis**  Time Code Sepsis Called/Page Received: 1310  Antibiotics Ordered: Cefepime/Metronidazole  Time of 1st antibiotic administration: 1424  Additional action taken by pharmacy: None  If necessary, Name of Provider/Nurse Contacted:  N/A - Delay of patient from waiting area to ED per bed availability   Lu Duffel, PharmD, BCPS Clinical Pharmacist 04/24/2019 2:48 PM

## 2019-04-29 ENCOUNTER — Other Ambulatory Visit: Payer: Self-pay | Admitting: Gastroenterology

## 2019-04-29 DIAGNOSIS — K5010 Crohn's disease of large intestine without complications: Secondary | ICD-10-CM

## 2019-04-29 LAB — CULTURE, BLOOD (ROUTINE X 2)
Culture: NO GROWTH
Culture: NO GROWTH
Special Requests: ADEQUATE
Special Requests: ADEQUATE

## 2019-04-30 ENCOUNTER — Other Ambulatory Visit: Payer: Self-pay | Admitting: Gastroenterology

## 2019-04-30 ENCOUNTER — Ambulatory Visit: Payer: BC Managed Care – PPO | Admitting: Gastroenterology

## 2019-04-30 DIAGNOSIS — K5010 Crohn's disease of large intestine without complications: Secondary | ICD-10-CM

## 2019-04-30 MED ORDER — HEPARIN SODIUM (PORCINE) 5000 UNIT/ML IJ SOLN
5000.00 | INTRAMUSCULAR | Status: DC
Start: 2019-04-28 — End: 2019-04-30

## 2019-04-30 MED ORDER — ACETAMINOPHEN 325 MG PO TABS
650.00 | ORAL_TABLET | ORAL | Status: DC
Start: ? — End: 2019-04-30

## 2019-04-30 MED ORDER — OXYCODONE HCL 5 MG PO TABS
10.00 | ORAL_TABLET | ORAL | Status: DC
Start: ? — End: 2019-04-30

## 2019-04-30 MED ORDER — ONDANSETRON 4 MG PO TBDP
4.00 | ORAL_TABLET | ORAL | Status: DC
Start: ? — End: 2019-04-30

## 2019-04-30 MED ORDER — ESCITALOPRAM OXALATE 5 MG/5ML PO SOLN
10.00 | ORAL | Status: DC
Start: 2019-04-29 — End: 2019-04-30

## 2019-04-30 MED ORDER — OXYCODONE HCL 5 MG PO TABS
5.00 | ORAL_TABLET | ORAL | Status: DC
Start: ? — End: 2019-04-30

## 2019-04-30 MED ORDER — LEVOFLOXACIN 500 MG PO TABS
500.00 | ORAL_TABLET | ORAL | Status: DC
Start: ? — End: 2019-04-30

## 2019-04-30 MED ORDER — PREDNISONE 20 MG PO TABS
20.00 | ORAL_TABLET | ORAL | Status: DC
Start: 2019-04-29 — End: 2019-04-30

## 2019-04-30 MED ORDER — ARIPIPRAZOLE 5 MG PO TABS
5.00 | ORAL_TABLET | ORAL | Status: DC
Start: 2019-04-29 — End: 2019-04-30

## 2019-04-30 MED ORDER — BUSPIRONE HCL 5 MG PO TABS
5.00 | ORAL_TABLET | ORAL | Status: DC
Start: 2019-04-28 — End: 2019-04-30

## 2019-04-30 MED ORDER — METRONIDAZOLE 500 MG PO TABS
500.00 | ORAL_TABLET | ORAL | Status: DC
Start: ? — End: 2019-04-30

## 2019-04-30 NOTE — Telephone Encounter (Signed)
Last office visit 04/10/2019 Exacerbation of crohn's  Last refill 04/10/2019 0 refills

## 2019-06-04 ENCOUNTER — Emergency Department
Admission: EM | Admit: 2019-06-04 | Discharge: 2019-06-04 | Disposition: A | Discharge status: Home / Self Care | Payer: BC Managed Care – PPO | Attending: Emergency Medicine | Admitting: Emergency Medicine

## 2019-06-04 ENCOUNTER — Encounter: Payer: Self-pay | Admitting: Emergency Medicine

## 2019-06-04 ENCOUNTER — Other Ambulatory Visit: Payer: Self-pay

## 2019-06-04 ENCOUNTER — Emergency Department: Payer: BC Managed Care – PPO

## 2019-06-04 DIAGNOSIS — K604 Rectal fistula: Secondary | ICD-10-CM | POA: Insufficient documentation

## 2019-06-04 DIAGNOSIS — K50919 Crohn's disease, unspecified, with unspecified complications: Secondary | ICD-10-CM

## 2019-06-04 DIAGNOSIS — Z79899 Other long term (current) drug therapy: Secondary | ICD-10-CM | POA: Insufficient documentation

## 2019-06-04 DIAGNOSIS — K611 Rectal abscess: Secondary | ICD-10-CM | POA: Diagnosis present

## 2019-06-04 DIAGNOSIS — L988 Other specified disorders of the skin and subcutaneous tissue: Secondary | ICD-10-CM | POA: Insufficient documentation

## 2019-06-04 LAB — CBC WITH DIFFERENTIAL/PLATELET
Abs Immature Granulocytes: 0.04 10*3/uL (ref 0.00–0.07)
Basophils Absolute: 0.1 10*3/uL (ref 0.0–0.1)
Basophils Relative: 1 %
Eosinophils Absolute: 0.1 10*3/uL (ref 0.0–0.5)
Eosinophils Relative: 1 %
HCT: 42.4 % (ref 39.0–52.0)
Hemoglobin: 13.7 g/dL (ref 13.0–17.0)
Immature Granulocytes: 0 %
Lymphocytes Relative: 12 %
Lymphs Abs: 1.3 10*3/uL (ref 0.7–4.0)
MCH: 25.6 pg — ABNORMAL LOW (ref 26.0–34.0)
MCHC: 32.3 g/dL (ref 30.0–36.0)
MCV: 79.3 fL — ABNORMAL LOW (ref 80.0–100.0)
Monocytes Absolute: 1.2 10*3/uL — ABNORMAL HIGH (ref 0.1–1.0)
Monocytes Relative: 11 %
Neutro Abs: 8.4 10*3/uL — ABNORMAL HIGH (ref 1.7–7.7)
Neutrophils Relative %: 75 %
Platelets: 271 10*3/uL (ref 150–400)
RBC: 5.35 MIL/uL (ref 4.22–5.81)
RDW: 14.2 % (ref 11.5–15.5)
WBC: 11 10*3/uL — ABNORMAL HIGH (ref 4.0–10.5)
nRBC: 0 % (ref 0.0–0.2)

## 2019-06-04 LAB — COMPREHENSIVE METABOLIC PANEL
ALT: 16 U/L (ref 0–44)
AST: 14 U/L — ABNORMAL LOW (ref 15–41)
Albumin: 4.1 g/dL (ref 3.5–5.0)
Alkaline Phosphatase: 75 U/L (ref 38–126)
Anion gap: 8 (ref 5–15)
BUN: 5 mg/dL — ABNORMAL LOW (ref 6–20)
CO2: 26 mmol/L (ref 22–32)
Calcium: 8.8 mg/dL — ABNORMAL LOW (ref 8.9–10.3)
Chloride: 104 mmol/L (ref 98–111)
Creatinine, Ser: 1.05 mg/dL (ref 0.61–1.24)
GFR calc Af Amer: >60 mL/min (ref >60)
GFR calc non Af Amer: >60 mL/min (ref >60)
Glucose, Bld: 99 mg/dL (ref 70–99)
Potassium: 3.7 mmol/L (ref 3.5–5.1)
Sodium: 138 mmol/L (ref 135–145)
Total Bilirubin: 0.8 mg/dL (ref 0.3–1.2)
Total Protein: 7.8 g/dL (ref 6.5–8.1)

## 2019-06-04 MED ORDER — FENTANYL CITRATE (PF) 100 MCG/2ML IJ SOLN
50.0000 ug | Freq: Once | INTRAMUSCULAR | Status: AC
  Administered 2019-06-04: 50 ug via INTRAVENOUS
  Filled 2019-06-04: qty 2

## 2019-06-04 MED ORDER — IOHEXOL 300 MG/ML  SOLN
100.0000 mL | Freq: Once | INTRAMUSCULAR | Status: AC | PRN
  Administered 2019-06-04: 100 mL via INTRAVENOUS

## 2019-06-04 MED ORDER — ONDANSETRON HCL 4 MG/2ML IJ SOLN
4.0000 mg | Freq: Once | INTRAMUSCULAR | Status: AC
  Administered 2019-06-04: 4 mg via INTRAVENOUS
  Filled 2019-06-04: qty 2

## 2019-06-04 MED ORDER — OXYCODONE HCL 5 MG PO TABS: 5.0000 mg | ORAL_TABLET | Freq: Four times a day (QID) | ORAL | 0 refills | Status: AC | PRN

## 2019-06-04 NOTE — ED Notes (Signed)
Assessment of abscess done by Dr. Jari Pigg with this RN as witness. Patient tolerated well.

## 2019-06-04 NOTE — ED Provider Notes (Signed)
[facility] Emergency Department Provider Note  ____________________________________________   First MD Initiated Contact with Patient 06/04/19 [phone]     (approximate)  I have reviewed the triage vital signs and the nursing notes.   HISTORY  Chief Complaint Abscess    HPI KEREM GILMER is a 34 y.o. male with anxiety, asthma, Crohn's status post right colectomy in 2013, perirectal abscess, rectal fistula who comes in for abscess.  On review of records patient had CT imaging done on 3/16 that showed a chronic right perirectal fistula with increased amount of low-density material along the anterior margin right side of the anorectal junction patient was transferred to [facility]  Patient was placed on IV Flagyl and Levaquin.  Patient underwent IR drainage.  Patient stated that he was discharged on Flagyl and Levaquin.  Patient follow-up with Dr. Launa Flight.  Patient is already been on 6-week course and was prescribed another 4 weeks.  Patient stated that he has been taking the medicines but not as prescribed and occasionally will miss some doses.  Patient noticed over the past few days some worsening pain around the rectum that is moderate, constant, nothing makes better, nothing makes it worse.  He reports to be more fluctuation than normal.  He denies any fevers.  Did have some nausea yesterday.       Past Medical History:  Diagnosis Date   Anxiety    Asthma    as a child   Blood in stool    Calculus of gallbladder without cholecystitis without obstruction 12/25/2015   Crohn's disease ([place])    Depression    Exacerbation of Crohn's disease ([place]) 09/10/2017   Gallbladder polyp 12/25/2015   Gastroenteritis due to norovirus 05/03/2017   Generalized abdominal pain    H/O Clostridium difficile infection 05/03/2017   Iron deficiency anemia 12/28/2016   Iron deficiency anemia 12/28/2016   Perirectal abscess 07/04/2015   Overview:  Added automatically from  request for surgery [id number]   Rectal fistula    Sepsis ([place]) 06/22/2016    Patient Active Problem List   Diagnosis Date Noted   Crohn's colitis, unspecified complication ([place]) 28/78/6767   Crohn's disease of anal canal ([place]) 11/29/2018   Other long term (current) drug therapy 11/14/2018   Crohn's disease of both small and large intestine with other complication ([place]) 20/94/7096   Iron deficiency anemia 12/28/2016   Osteoporosis 12/09/2016   Perirectal fistula    Fracture of metacarpal bone 06/09/2016   Intestinal bypass or anastomosis status    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   Asthma, mild intermittent 04/02/2015    Past Surgical History:  Procedure Laterality Date   COLON RESECTION     COLON SURGERY     COLONOSCOPY N/A 11/30/2018   Procedure: COLONOSCOPY;  Surgeon: Lin Landsman, MD;  Location: [facility] ENDOSCOPY;  Service: Gastroenterology;  Laterality: N/A;   COLONOSCOPY WITH PROPOFOL N/A 04/03/2015   Procedure: COLONOSCOPY WITH PROPOFOL;  Surgeon: Hulen Luster, MD;  Location: [facility] ENDOSCOPY;  Service: Endoscopy;  Laterality: N/A;   COLONOSCOPY WITH PROPOFOL N/A 12/22/2015   Procedure: COLONOSCOPY WITH PROPOFOL;  Surgeon: [name], MD;  Location: [place];  Service: Endoscopy;  Laterality: N/A;   COLONOSCOPY WITH PROPOFOL N/A 12/22/2016   Procedure: COLONOSCOPY WITH PROPOFOL;  Surgeon: Lin Landsman, MD;  Location: [place];  Service: Endoscopy;  Laterality: N/A;   COLONOSCOPY WITH PROPOFOL N/A 09/14/2017   Procedure: COLONOSCOPY WITH PROPOFOL;  Surgeon: [name],  Tally Due, MD;  Location: [place];  Service: Gastroenterology;  Laterality: N/A;   ESOPHAGOGASTRODUODENOSCOPY (EGD) WITH PROPOFOL N/A 12/22/2016   Procedure: ESOPHAGOGASTRODUODENOSCOPY (EGD) WITH PROPOFOL;  Surgeon: Lin Landsman, MD;  Location: [place];  Service: Endoscopy;  Laterality: N/A;   rectal fistula  surgery     WRIST SURGERY      Prior to Admission medications   Medication Sig Start Date End Date Taking? Authorizing Provider  albuterol (VENTOLIN HFA) 108 (90 Base) MCG/ACT inhaler Inhale 2 puffs into the lungs every 6 (six) hours as needed for wheezing.    [provider]  ARIPiprazole (ABILIFY) 5 MG tablet Take 5 mg by mouth daily. 12/15/18   [provider]  busPIRone (BUSPAR) 5 MG tablet Take 5 mg by mouth 2 (two) times daily. 02/14/19   [provider]  escitalopram (LEXAPRO) 10 MG tablet Take 1 tablet (10 mg total) by mouth daily. 02/20/18   Ursula Alert, MD  feeding supplement, ENSURE ENLIVE, (ENSURE ENLIVE) LIQD Take 237 mLs by mouth 3 (three) times daily between meals. 03/20/19   Dhungel, Flonnie Overman, MD  folic acid (FOLVITE) 1 MG tablet Take 1 tablet (1 mg total) by mouth daily for 130 doses. Take 2 tablets on the day of methotrexate in addition to 1 tablet daily 04/16/19 08/24/19  Lin Landsman, MD  hydrOXYzine (VISTARIL) 25 MG capsule Take 1 capsule (25 mg total) by mouth daily as needed (severe anxiety attacks). 07/15/17   Ursula Alert, MD  inFLIXimab (REMICADE) 100 MG injection Inject 100 mg into the vein every 8 (eight) weeks.    [provider]  loperamide (IMODIUM) 2 MG capsule Take by mouth 4 (four) times daily as needed. 03/20/19   [provider]  mesalamine (ROWASA) 4 g enema Place 60 mLs (4 g total) rectally at bedtime. 04/30/19   Lin Landsman, MD  methotrexate (RHEUMATREX) 2.5 MG tablet Take 10 tablets (25 mg total) by mouth once a week. Caution:Chemotherapy. Protect from light. 04/16/19 07/15/19  Lin Landsman, MD  predniSONE (DELTASONE) 10 MG tablet Start 40 mg once a day for 1 week, then 30 mg daily for 1 week, then 20 mg daily for 1 week, then 10 mg daily for 1 week 04/10/19   Lin Landsman, MD    Allergies Patient has no known allergies.  Family History  Problem Relation Age of Onset   Diabetes Paternal  Grandfather    Heart disease Paternal Grandfather    Depression Mother    Drug abuse Mother    Osteoporosis Neg Hx     Social History Social History   Tobacco Use   Smoking status: Never Smoker   Smokeless tobacco: Former Network engineer Use Topics   Alcohol use: Yes    Comment: once a month   Drug use: No    Comment: hx of 3 year ago       Review of Systems Constitutional: No fever/chills Eyes: No visual changes. ENT: No sore throat. Cardiovascular: Denies chest pain. Respiratory: Denies shortness of breath. Gastrointestinal: No abdominal pain.  No nausea, no vomiting.  No diarrhea.  No constipation.  Pain at rectum Genitourinary: Negative for dysuria. Musculoskeletal: Negative for back pain. Skin: Negative for rash. Neurological: Negative for headaches, focal weakness or numbness. All other ROS negative ____________________________________________   PHYSICAL EXAM:  VITAL SIGNS: ED Triage Vitals  Enc Vitals Group     BP 06/04/19 0809 117/62     Pulse Rate 06/04/19 0809 96  Resp 06/04/19 0809 19     Temp 06/04/19 0809 98.9 F (37.2 C)     Temp Source 06/04/19 0809 Oral     SpO2 06/04/19 0809 100 %     Weight 06/04/19 0804 145 lb (65.8 kg)     Height 06/04/19 0804 5' 5"  (1.651 m)     Head Circumference --      Peak Flow --      Pain Score 06/04/19 0804 4     Pain Loc --      Pain Edu? --      Excl. in [place]? --     Constitutional: Alert and oriented. Well appearing and in no acute distress. Eyes: Conjunctivae are normal. EOMI. Head: Atraumatic. Nose: No congestion/rhinnorhea. Mouth/Throat: Mucous membranes are moist.   Neck: No stridor. Trachea Midline. FROM Cardiovascular: Normal rate, regular rhythm. Grossly normal heart sounds.  Good peripheral circulation. Respiratory: Normal respiratory effort.  No retractions. Lungs CTAB. Gastrointestinal: Soft and nontender. No distention. No abdominal bruits.  Musculoskeletal: No lower extremity  tenderness nor edema.  No joint effusions. Neurologic:  Normal speech and language. No gross focal neurologic deficits are appreciated.  Skin:  Skin is warm, dry and intact. No rash noted. Psychiatric: Mood and affect are normal. Speech and behavior are normal. GU: Skin tag noted.  Right perirectal chronic appearing fistula without any active drainage.  Some mild tenderness over the area that feels little indurated but no obvious fluctuation.  No significant redness or crepitus.  ____________________________________________   LABS (all labs ordered are listed, but only abnormal results are displayed)  Labs Reviewed  CBC WITH DIFFERENTIAL/PLATELET - Abnormal; Notable for the following components:      Result Value   WBC 11.0 (*)    MCV 79.3 (*)    MCH 25.6 (*)    Neutro Abs 8.4 (*)    Monocytes Absolute 1.2 (*)    All other components within normal limits  COMPREHENSIVE METABOLIC PANEL - Abnormal; Notable for the following components:   BUN 5 (*)    Calcium 8.8 (*)    AST 14 (*)    All other components within normal limits   ____________________________________________   ED RADIOLOGY  Official radiology report(s): CT ABDOMEN PELVIS W CONTRAST  Result Date: 06/04/2019 CLINICAL DATA:  Assess for perianal abscess. EXAM: CT ABDOMEN AND PELVIS WITH CONTRAST TECHNIQUE: Multidetector CT imaging of the abdomen and pelvis was performed using the standard protocol following bolus administration of intravenous contrast. CONTRAST:  156m OMNIPAQUE IOHEXOL 300 MG/ML  SOLN COMPARISON:  04/24/2019 FINDINGS: Lower chest: Lung bases are clear. No effusions. Heart is normal size. Hepatobiliary: Small layering gallstone in the gallbladder. No focal hepatic abnormality. Pancreas: No focal abnormality or ductal dilatation. Spleen: No focal abnormality.  Normal size. Adrenals/Urinary Tract: No adrenal abnormality. No focal renal abnormality. No stones or hydronephrosis. Urinary bladder is unremarkable.  Stomach/Bowel: Prior right hemicolectomy. Right perirectal fistula to the right buttock is again noted as seen on prior study. The previously seen perirectal fluid collection no longer visualized. Soft tissue thickening in this region may reflect collapsed abscess walls. Stomach and small bowel decompressed, unremarkable. Vascular/Lymphatic: No evidence of aneurysm or adenopathy. Reproductive: No visible focal abnormality. Other: No free fluid or free air. Musculoskeletal: No acute bony abnormality. IMPRESSION: Continued right perirectal fistula to the right medial buttock. Previously seen perirectal fluid collection no longer visualized. Cholelithiasis. Electronically Signed   By: KRolm BaptiseM.D.   On: 06/04/2019 09:59    ____________________________________________  PROCEDURES  Procedure(s) performed (including Critical Care):  Procedures   ____________________________________________   INITIAL IMPRESSION / ASSESSMENT AND PLAN / ED COURSE  Kyshaun Barnette Hoen was evaluated in Emergency Department on 06/04/2019 for the symptoms described in the history of present illness. He was evaluated in the context of the global COVID-19 pandemic, which necessitated consideration that the patient might be at risk for infection with the SARS-CoV-2 virus that causes COVID-19. Institutional protocols and algorithms that pertain to the evaluation of patients at risk for COVID-19 are in a state of rapid change based on information released by regulatory bodies including the CDC and federal and state organizations. These policies and algorithms were followed during the patient's care in the ED.    Patient is a 34 year old who comes in with concerns for recurrent abscess.  We did a CT imaging to evaluate for abscess versus just chronic fistula lower suspicion for necrotizing fasciitis given examination.  Patient does not appear septic or bacteremic.  White count elevated at 11.0 Labs are reassuring  otherwise.  CT imaging was negative.  The area of the felt little indurated the concern is possibly just more of just the decompressed pulse from the prior abscess.  No obvious drainage.   Discussed with patient he is remained afebrile and appears well.  Patient said that he did run out of pain medications recently.  We will give a few doses of pain medicine encourage patient continue his Cipro and Flagyl.  Otherwise I think the patient can follow-up outpatient with his [facility] surgery team.  Patient states that he can call them and get an appointment for this week.  Patient feels comfortable this plan and will return to the ER if he develops fevers worsening pain or any other concerns  I discussed the provisional nature of ED diagnosis, the treatment so far, the ongoing plan of care, follow up appointments and return precautions with the patient and any family or support people present. They expressed understanding and agreed with the plan, discharged home.           ____________________________________________   FINAL CLINICAL IMPRESSION(S) / ED DIAGNOSES   Final diagnoses:  None      MEDICATIONS GIVEN DURING THIS VISIT:  Medications  fentaNYL (SUBLIMAZE) injection 50 mcg (50 mcg Intravenous Given 06/04/19 0927)  ondansetron (ZOFRAN) injection 4 mg (4 mg Intravenous Given 06/04/19 0926)  iohexol (OMNIPAQUE) 300 MG/ML solution 100 mL (100 mLs Intravenous Contrast Given 06/04/19 0937)     ED Discharge Orders         Ordered    oxyCODONE (ROXICODONE) 5 MG immediate release tablet  Every 6 hours PRN     06/04/19 1054           Note:  This document was prepared using Dragon voice recognition software and may include unintentional dictation errors.   Vanessa Calzada, MD 06/04/19 1055

## 2019-06-04 NOTE — Discharge Instructions (Addendum)
Your CT scan is as below.  No signs of active abscess.  Take the pain medicine and call your doctor today to get a follow-up appointment this week.  Return the ER for fevers, worsening pain or any other concerns    IMPRESSION:  Continued right perirectal fistula to the right medial buttock.  Previously seen perirectal fluid collection no longer visualized.     Cholelithiasis.

## 2019-06-04 NOTE — ED Triage Notes (Signed)
Pt here for abscess.  Reports was seen here recently and sent to [facility] for drainage because too close to rectum.  Pt had chills last night. Did not take abx as prescribed, abscess returned.

## 2019-06-12 ENCOUNTER — Other Ambulatory Visit
Admission: RE | Admit: 2019-06-12 | Discharge: 2019-06-12 | Disposition: A | Discharge status: Home / Self Care | Payer: BC Managed Care – PPO | Source: Ambulatory Visit | Attending: Internal Medicine | Admitting: Internal Medicine

## 2019-06-12 DIAGNOSIS — R197 Diarrhea, unspecified: Secondary | ICD-10-CM | POA: Diagnosis present

## 2019-06-12 LAB — C DIFFICILE QUICK SCREEN W PCR REFLEX
C Diff antigen: NEGATIVE
C Diff interpretation: NOT DETECTED
C Diff toxin: NEGATIVE

## 2019-09-02 ENCOUNTER — Encounter: Payer: Self-pay | Admitting: Emergency Medicine

## 2019-09-02 ENCOUNTER — Other Ambulatory Visit: Payer: Self-pay

## 2019-09-02 DIAGNOSIS — Z7951 Long term (current) use of inhaled steroids: Secondary | ICD-10-CM | POA: Insufficient documentation

## 2019-09-02 DIAGNOSIS — K529 Noninfective gastroenteritis and colitis, unspecified: Secondary | ICD-10-CM | POA: Diagnosis not present

## 2019-09-02 DIAGNOSIS — K611 Rectal abscess: Secondary | ICD-10-CM | POA: Diagnosis present

## 2019-09-02 DIAGNOSIS — J45909 Unspecified asthma, uncomplicated: Secondary | ICD-10-CM | POA: Diagnosis not present

## 2019-09-02 DIAGNOSIS — K50113 Crohn's disease of large intestine with fistula: Secondary | ICD-10-CM | POA: Insufficient documentation

## 2019-09-02 LAB — COMPREHENSIVE METABOLIC PANEL
ALT: 111 U/L — ABNORMAL HIGH (ref 0–44)
AST: 142 U/L — ABNORMAL HIGH (ref 15–41)
Albumin: 4.3 g/dL (ref 3.5–5.0)
Alkaline Phosphatase: 83 U/L (ref 38–126)
Anion gap: 11 (ref 5–15)
BUN: 8 mg/dL (ref 6–20)
CO2: 24 mmol/L (ref 22–32)
Calcium: 9.1 mg/dL (ref 8.9–10.3)
Chloride: 104 mmol/L (ref 98–111)
Creatinine, Ser: 0.98 mg/dL (ref 0.61–1.24)
GFR calc Af Amer: >60 mL/min (ref >60)
GFR calc non Af Amer: >60 mL/min (ref >60)
Glucose, Bld: 87 mg/dL (ref 70–99)
Potassium: 4.1 mmol/L (ref 3.5–5.1)
Sodium: 139 mmol/L (ref 135–145)
Total Bilirubin: 0.9 mg/dL (ref 0.3–1.2)
Total Protein: 8.1 g/dL (ref 6.5–8.1)

## 2019-09-02 LAB — CBC WITH DIFFERENTIAL/PLATELET
Abs Immature Granulocytes: 0.06 10*3/uL (ref 0.00–0.07)
Basophils Absolute: 0.1 10*3/uL (ref 0.0–0.1)
Basophils Relative: 1 %
Eosinophils Absolute: 0.1 10*3/uL (ref 0.0–0.5)
Eosinophils Relative: 1 %
HCT: 44.1 % (ref 39.0–52.0)
Hemoglobin: 13.6 g/dL (ref 13.0–17.0)
Immature Granulocytes: 1 %
Lymphocytes Relative: 20 %
Lymphs Abs: 2 10*3/uL (ref 0.7–4.0)
MCH: 24.6 pg — ABNORMAL LOW (ref 26.0–34.0)
MCHC: 30.8 g/dL (ref 30.0–36.0)
MCV: 79.7 fL — ABNORMAL LOW (ref 80.0–100.0)
Monocytes Absolute: 0.8 10*3/uL (ref 0.1–1.0)
Monocytes Relative: 8 %
Neutro Abs: 6.9 10*3/uL (ref 1.7–7.7)
Neutrophils Relative %: 69 %
Platelets: 270 10*3/uL (ref 150–400)
RBC: 5.53 MIL/uL (ref 4.22–5.81)
RDW: 14.6 % (ref 11.5–15.5)
WBC: 9.9 10*3/uL (ref 4.0–10.5)
nRBC: 0 % (ref 0.0–0.2)

## 2019-09-02 LAB — LACTIC ACID, PLASMA: Lactic Acid, Venous: 1.3 mmol/L (ref 0.5–1.9)

## 2019-09-02 NOTE — ED Triage Notes (Signed)
Pt reports had surgery on an abscess on the right side of his buttocks. Pt reports something popped today and drained in his pants. Pt concerned for infection.

## 2019-09-03 ENCOUNTER — Encounter: Payer: Self-pay | Admitting: Radiology

## 2019-09-03 ENCOUNTER — Emergency Department: Payer: BC Managed Care – PPO

## 2019-09-03 ENCOUNTER — Emergency Department
Admission: EM | Admit: 2019-09-03 | Discharge: 2019-09-03 | Disposition: A | Discharge status: Home / Self Care | Payer: BC Managed Care – PPO | Attending: Emergency Medicine | Admitting: Emergency Medicine

## 2019-09-03 DIAGNOSIS — K529 Noninfective gastroenteritis and colitis, unspecified: Secondary | ICD-10-CM

## 2019-09-03 DIAGNOSIS — K50913 Crohn's disease, unspecified, with fistula: Secondary | ICD-10-CM

## 2019-09-03 MED ORDER — METRONIDAZOLE 500 MG PO TABS
500.0000 mg | ORAL_TABLET | Freq: Two times a day (BID) | ORAL | 0 refills | Status: DC
Start: 2019-09-03 — End: 2019-09-12

## 2019-09-03 MED ORDER — MORPHINE SULFATE (PF) 4 MG/ML IV SOLN
4.0000 mg | Freq: Once | INTRAVENOUS | Status: AC
  Administered 2019-09-03: 4 mg via INTRAVENOUS
  Filled 2019-09-03: qty 1

## 2019-09-03 MED ORDER — METRONIDAZOLE 500 MG PO TABS
500.0000 mg | ORAL_TABLET | Freq: Once | ORAL | Status: AC
  Administered 2019-09-03: 500 mg via ORAL
  Filled 2019-09-03: qty 1

## 2019-09-03 MED ORDER — SODIUM CHLORIDE 0.9 % IV BOLUS
1000.0000 mL | Freq: Once | INTRAVENOUS | Status: AC
  Administered 2019-09-03: 1000 mL via INTRAVENOUS

## 2019-09-03 MED ORDER — CIPROFLOXACIN HCL 500 MG PO TABS
500.0000 mg | ORAL_TABLET | Freq: Once | ORAL | Status: AC
  Administered 2019-09-03: 500 mg via ORAL
  Filled 2019-09-03: qty 1

## 2019-09-03 MED ORDER — IOHEXOL 300 MG/ML  SOLN
100.0000 mL | Freq: Once | INTRAMUSCULAR | Status: AC | PRN
  Administered 2019-09-03: 100 mL via INTRAVENOUS

## 2019-09-03 MED ORDER — ONDANSETRON HCL 4 MG/2ML IJ SOLN
4.0000 mg | Freq: Once | INTRAMUSCULAR | Status: AC
  Administered 2019-09-03: 4 mg via INTRAVENOUS
  Filled 2019-09-03: qty 2

## 2019-09-03 MED ORDER — CIPROFLOXACIN HCL 500 MG PO TABS
500.0000 mg | ORAL_TABLET | Freq: Two times a day (BID) | ORAL | 0 refills | Status: DC
Start: 2019-09-03 — End: 2019-09-12

## 2019-09-03 NOTE — Discharge Instructions (Signed)
1.  Take antibiotics as prescribed: Cipro 500 mg twice daily x7 days Flagyl 500 mg twice daily x7 days 2.  Return to the ER for worsening symptoms, persistent vomiting, difficulty breathing or other concerns.

## 2019-09-03 NOTE — ED Provider Notes (Signed)
[facility] Emergency Department Provider Note   ____________________________________________   First MD Initiated Contact with Patient 09/03/19 [phone]     (approximate)  I have reviewed the triage vital signs and the nursing notes.   HISTORY  Chief Complaint Abscess    HPI Leroy Hernandez is a 34 y.o. male who presents to the ED from home with a chief complaint of concern for wound infection.  Patient has a history of Crohn's disease, recently switched from IV Remicade/methotrexate to Tat Momoli.  He was hospitalized at [facility] 07/09/2019 for Crohn's exacerbation with right perirectal fistula with perirectal abscess status post I&D on 07/11/2019.  He was on Cipro/Flagyl and did well status post surgery.  States he was at work yesterday when he noted firmness to his incision site followed by feeling like something popped and drained in his underwear.  Denies fever, chills, cough, chest pain, shortness of breath, abdominal pain, nausea, vomiting or diarrhea.       Past Medical History:  Diagnosis Date  . Anxiety   . Asthma    as a child  . Blood in stool   . Calculus of gallbladder without cholecystitis without obstruction 12/25/2015  . Crohn's disease ([place])   . Depression   . Exacerbation of Crohn's disease ([place]) 09/10/2017  . Gallbladder polyp 12/25/2015  . Gastroenteritis due to norovirus 05/03/2017  . Generalized abdominal pain   . H/O Clostridium difficile infection 05/03/2017  . Iron deficiency anemia 12/28/2016  . Iron deficiency anemia 12/28/2016  . Perirectal abscess 07/04/2015   Overview:  Added automatically from request for surgery [phone]  . Rectal fistula   . Sepsis ([place]) 06/22/2016    Patient Active Problem List   Diagnosis Date Noted  . Crohn's colitis, unspecified complication ([place]) 19/37/9024  . Crohn's disease of anal canal ([place]) 11/29/2018  . Other long term (current) drug therapy 11/14/2018  . Crohn's disease of both small and large  intestine with other complication ([place]) 09/73/5329  . Iron deficiency anemia 12/28/2016  . Osteoporosis 12/09/2016  . Perirectal fistula   . Fracture of metacarpal bone 06/09/2016  . Intestinal bypass or anastomosis status   . Mild episode of recurrent major depressive disorder ([place]) 04/02/2015  . Asthma, mild intermittent 04/02/2015    Past Surgical History:  Procedure Laterality Date  . COLON RESECTION    . COLON SURGERY    . COLONOSCOPY N/A 11/30/2018   Procedure: COLONOSCOPY;  Surgeon: Lin Landsman, MD;  Location: [facility] ENDOSCOPY;  Service: Gastroenterology;  Laterality: N/A;  . COLONOSCOPY WITH PROPOFOL N/A 04/03/2015   Procedure: COLONOSCOPY WITH PROPOFOL;  Surgeon: Hulen Luster, MD;  Location: [facility] ENDOSCOPY;  Service: Endoscopy;  Laterality: N/A;  . COLONOSCOPY WITH PROPOFOL N/A 12/22/2015   Procedure: COLONOSCOPY WITH PROPOFOL;  Surgeon: [name], MD;  Location: [place];  Service: Endoscopy;  Laterality: N/A;  . COLONOSCOPY WITH PROPOFOL N/A 12/22/2016   Procedure: COLONOSCOPY WITH PROPOFOL;  Surgeon: Lin Landsman, MD;  Location: [place];  Service: Endoscopy;  Laterality: N/A;  . COLONOSCOPY WITH PROPOFOL N/A 09/14/2017   Procedure: COLONOSCOPY WITH PROPOFOL;  Surgeon: Lin Landsman, MD;  Location: [facility] ENDOSCOPY;  Service: Gastroenterology;  Laterality: N/A;  . ESOPHAGOGASTRODUODENOSCOPY (EGD) WITH PROPOFOL N/A 12/22/2016   Procedure: ESOPHAGOGASTRODUODENOSCOPY (EGD) WITH PROPOFOL;  Surgeon: Lin Landsman, MD;  Location: [place];  Service: Endoscopy;  Laterality: N/A;  . rectal fistula surgery    . WRIST SURGERY      Prior  to Admission medications   Medication Sig Start Date End Date Taking? Authorizing Provider  albuterol (VENTOLIN HFA) 108 (90 Base) MCG/ACT inhaler Inhale 2 puffs into the lungs every 6 (six) hours as needed for wheezing.    [provider]  ARIPiprazole (ABILIFY) 5 MG tablet Take 5 mg by  mouth daily. 12/15/18   [provider]  busPIRone (BUSPAR) 5 MG tablet Take 5 mg by mouth 2 (two) times daily. 02/14/19   [provider]  ciprofloxacin (CIPRO) 500 MG tablet Take 1 tablet (500 mg total) by mouth 2 (two) times daily. 09/03/19   Paulette Blanch, MD  escitalopram (LEXAPRO) 10 MG tablet Take 1 tablet (10 mg total) by mouth daily. 02/20/18   Ursula Alert, MD  feeding supplement, ENSURE ENLIVE, (ENSURE ENLIVE) LIQD Take 237 mLs by mouth 3 (three) times daily between meals. 03/20/19   Dhungel, Flonnie Overman, MD  hydrOXYzine (VISTARIL) 25 MG capsule Take 1 capsule (25 mg total) by mouth daily as needed (severe anxiety attacks). 07/15/17   Ursula Alert, MD  inFLIXimab (REMICADE) 100 MG injection Inject 100 mg into the vein every 8 (eight) weeks.    [provider]  loperamide (IMODIUM) 2 MG capsule Take by mouth 4 (four) times daily as needed. 03/20/19   [provider]  mesalamine (ROWASA) 4 g enema Place 60 mLs (4 g total) rectally at bedtime. 04/30/19   Lin Landsman, MD  metroNIDAZOLE (FLAGYL) 500 MG tablet Take 1 tablet (500 mg total) by mouth 2 (two) times daily. 09/03/19   Paulette Blanch, MD  predniSONE (DELTASONE) 10 MG tablet Start 40 mg once a day for 1 week, then 30 mg daily for 1 week, then 20 mg daily for 1 week, then 10 mg daily for 1 week 04/10/19   Lin Landsman, MD    Allergies Patient has no known allergies.  Family History  Problem Relation Age of Onset  . Diabetes Paternal Grandfather   . Heart disease Paternal Grandfather   . Depression Mother   . Drug abuse Mother   . Osteoporosis Neg Hx     Social History Social History   Tobacco Use  . Smoking status: Never Smoker  . Smokeless tobacco: Former Network engineer  . Vaping Use: Never used  Substance Use Topics  . Alcohol use: Yes    Comment: once a month  . Drug use: No    Comment: hx of 3 year ago     Review of Systems  Constitutional: No fever/chills Eyes: No  visual changes. ENT: No sore throat. Cardiovascular: Denies chest pain. Respiratory: Denies shortness of breath. Gastrointestinal: Positive for drainage from perianal abscess I&D wound.  No abdominal pain.  No nausea, no vomiting.  No diarrhea.  No constipation. Genitourinary: Negative for dysuria. Musculoskeletal: Negative for back pain. Skin: Negative for rash. Neurological: Negative for headaches, focal weakness or numbness.   ____________________________________________   PHYSICAL EXAM:  VITAL SIGNS: ED Triage Vitals  Enc Vitals Group     BP 09/02/19 1758 115/75     Pulse Rate 09/02/19 1758 86     Resp 09/02/19 1758 16     Temp 09/02/19 1758 99 F (37.2 C)     Temp Source 09/02/19 1758 Oral     SpO2 09/02/19 1758 100 %     Weight 09/02/19 1739 150 lb (68 kg)     Height 09/02/19 1739 5' 5"  (1.651 m)     Head Circumference --  Peak Flow --      Pain Score 09/02/19 1739 7     Pain Loc --      Pain Edu? --      Excl. in [place]? --     Constitutional: Alert and oriented. Well appearing and in no acute distress. Eyes: Conjunctivae are normal. PERRL. EOMI. Head: Atraumatic. Nose: No congestion/rhinnorhea. Mouth/Throat: Mucous membranes are moist.   Neck: No stridor.   Cardiovascular: Normal rate, regular rhythm. Grossly normal heart sounds.  Good peripheral circulation. Respiratory: Normal respiratory effort.  No retractions. Lungs CTAB. Gastrointestinal: Soft and nontender to light or deep palpation. No distention. No abdominal bruits. No CVA tenderness. Rectal: Small nonthrombosed, nonbleeding external hemorrhoid noted.  I&D wound without active drainage.  No warmth/erythema.  Tender to palpation.  Tolerated DRE well. Musculoskeletal: No lower extremity tenderness nor edema.  No joint effusions. Neurologic:  Normal speech and language. No gross focal neurologic deficits are appreciated. No gait instability. Skin:  Skin is warm, dry and intact. No rash  noted. Psychiatric: Mood and affect are normal. Speech and behavior are normal.  ____________________________________________   LABS (all labs ordered are listed, but only abnormal results are displayed)  Labs Reviewed  COMPREHENSIVE METABOLIC PANEL - Abnormal; Notable for the following components:      Result Value   AST 142 (*)    ALT 111 (*)    All other components within normal limits  CBC WITH DIFFERENTIAL/PLATELET - Abnormal; Notable for the following components:   MCV 79.7 (*)    MCH 24.6 (*)    All other components within normal limits  LACTIC ACID, PLASMA   ____________________________________________  EKG  None ____________________________________________  RADIOLOGY  ED MD interpretation: Persistent right perianal fistula without abscess, enteritis likely secondary to underlying Crohn's disease, no intraperitoneal or retroperitoneal abscess, cholelithiasis, 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 Negative oral contrast was given. CONTRAST:  127m OMNIPAQUE IOHEXOL 300 MG/ML  SOLN COMPARISON:  June 04, 2019 FINDINGS: Lower chest: There is mild left base atelectasis. Lung bases otherwise are clear. Hepatobiliary: No focal liver lesions are evident. There is cholelithiasis. No gallbladder wall thickening evident. No evident biliary duct dilatation. Pancreas: No pancreatic mass or inflammatory focus. Spleen: No splenic lesions are evident. Adrenals/Urinary Tract: Adrenals bilaterally appear unremarkable. Kidneys bilaterally show no evident mass or hydronephrosis on either side. There is no appreciable renal or ureteral calculus on either side. Urinary bladder is midline with  wall thickness within normal limits. Stomach/Bowel: Patient has had previous right hemicolectomy. Anastomosis at the postoperative site is patent. There is somewhat generalized bowel wall thickening throughout most of the small bowel without bowel obstruction evident. No transition zone noted. No small or large bowel fistula noted in particular. No evident free air or portal venous air. There is an apparent fistula arising from the right perianal region extending posteriorly into the right perineum, a finding that was also present on previous study and appears similar. There is no air-fluid level in this area or well-defined associated abscess seen. No new fistula is evident in the perianal/perirectal regions. Vascular/Lymphatic: No abdominal aortic aneurysm. No arterial vascular lesions are appreciable. Major venous structures appear patent. Note that there is a circumaortic left renal vein, an anatomic variant. No adenopathy is  appreciable in the abdomen or pelvis. Reproductive: Prostate and seminal vesicles are normal in size and contour. No pelvic mass evident. Other: No abscess or ascites evident within the peritoneum or retroperitoneum. Musculoskeletal: No blastic or lytic bone lesions. There is a degree of symmetric sacroiliitis consistent with Crohn's disease. No intramuscular lesions. IMPRESSION: 1. Persistent right perianal fistula tracking into the posterior right perineum with surrounding soft tissue thickening, similar to prior study. No frank abscess in this area. 2. Diffuse apparent enteritis, likely due to underlying Crohn's disease. No small or large bowel fistula noted. Note that the patient is status post right hemicolectomy. No bowel obstruction. No intraperitoneal or retroperitoneal abscess. 3.  Cholelithiasis. 4. There is a degree of sacroiliitis, likely due to Crohn's disease. Electronically Signed   By: Lowella Grip III M.D.   On: 09/03/2019 06:23     ____________________________________________   PROCEDURES  Procedure(s) performed (including Critical Care):  Procedures   ____________________________________________   INITIAL IMPRESSION / ASSESSMENT AND PLAN / ED COURSE  As part of my medical decision making, I reviewed the following data within the [place] notes reviewed and incorporated, Labs reviewed, Radiograph reviewed, Notes from prior ED visits and [place] Controlled Substance Database     Leroy Hernandez was evaluated in Emergency Department on 09/03/2019 for the symptoms described in the history of present illness. He was evaluated in the context of the global COVID-19 pandemic, which necessitated consideration that the patient might be at risk for infection with the SARS-CoV-2 virus that causes COVID-19. Institutional protocols and algorithms that pertain to the evaluation of patients at risk for COVID-19 are in a state of rapid change based on information released by regulatory bodies including the CDC and federal and state organizations. These policies and algorithms were followed during the patient's care in the ED.    34 year old male with Crohn's disease on [place] status post I&D of right perirectal abscess 6 weeks ago presents with concern for wound infection.  Differential diagnosis includes but is not limited to perirectal abscess, superficial wound infection, cellulitis, fistula, etc.  Laboratory results demonstrate normal WBC and lactic acid.  Given patient's complicated GI history, will obtain CT enterography abdomen/pelvis to evaluate for fistula/abscess.   Clinical Course as of Sep 03 703  Mon Sep 03, 2019  0634 Updated patient of CT imaging result.  Will cover empirically with Cipro/Flagyl and patient will follow up with his GI surgeon this week.  Strict return precautions given.  Patient verbalizes understanding agrees with plan of care.   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   Paulette Blanch, MD 09/03/19 [phone]

## 2019-09-21 ENCOUNTER — Other Ambulatory Visit: Payer: Self-pay | Admitting: Gastroenterology

## 2019-09-21 DIAGNOSIS — K5080 Crohn's disease of both small and large intestine without complications: Secondary | ICD-10-CM

## 2019-09-21 DIAGNOSIS — K50113 Crohn's disease of large intestine with fistula: Secondary | ICD-10-CM

## 2019-10-09 ENCOUNTER — Other Ambulatory Visit: Payer: Self-pay | Admitting: Gastroenterology

## 2019-10-09 DIAGNOSIS — K5080 Crohn's disease of both small and large intestine without complications: Secondary | ICD-10-CM

## 2019-10-09 DIAGNOSIS — K50113 Crohn's disease of large intestine with fistula: Secondary | ICD-10-CM

## 2019-10-23 ENCOUNTER — Emergency Department: Payer: BC Managed Care – PPO

## 2019-10-23 ENCOUNTER — Emergency Department
Admission: EM | Admit: 2019-10-23 | Discharge: 2019-10-23 | Disposition: A | Discharge status: Home / Self Care | Payer: BC Managed Care – PPO | Attending: Emergency Medicine | Admitting: Emergency Medicine

## 2019-10-23 ENCOUNTER — Other Ambulatory Visit: Payer: Self-pay

## 2019-10-23 ENCOUNTER — Encounter: Payer: Self-pay | Admitting: Emergency Medicine

## 2019-10-23 DIAGNOSIS — R509 Fever, unspecified: Secondary | ICD-10-CM | POA: Insufficient documentation

## 2019-10-23 DIAGNOSIS — Z79899 Other long term (current) drug therapy: Secondary | ICD-10-CM | POA: Diagnosis not present

## 2019-10-23 DIAGNOSIS — R197 Diarrhea, unspecified: Secondary | ICD-10-CM | POA: Diagnosis not present

## 2019-10-23 DIAGNOSIS — K6289 Other specified diseases of anus and rectum: Secondary | ICD-10-CM | POA: Diagnosis not present

## 2019-10-23 DIAGNOSIS — Z91010 Allergy to peanuts: Secondary | ICD-10-CM | POA: Insufficient documentation

## 2019-10-23 DIAGNOSIS — Z87898 Personal history of other specified conditions: Secondary | ICD-10-CM

## 2019-10-23 DIAGNOSIS — J45909 Unspecified asthma, uncomplicated: Secondary | ICD-10-CM | POA: Insufficient documentation

## 2019-10-23 LAB — CBC WITH DIFFERENTIAL/PLATELET
Abs Immature Granulocytes: 0.05 10*3/uL (ref 0.00–0.07)
Basophils Absolute: 0.1 10*3/uL (ref 0.0–0.1)
Basophils Relative: 1 %
Eosinophils Absolute: 0.1 10*3/uL (ref 0.0–0.5)
Eosinophils Relative: 1 %
HCT: 43.9 % (ref 39.0–52.0)
Hemoglobin: 14.7 g/dL (ref 13.0–17.0)
Immature Granulocytes: 1 %
Lymphocytes Relative: 17 %
Lymphs Abs: 1.6 10*3/uL (ref 0.7–4.0)
MCH: 26.2 pg (ref 26.0–34.0)
MCHC: 33.5 g/dL (ref 30.0–36.0)
MCV: 78.1 fL — ABNORMAL LOW (ref 80.0–100.0)
Monocytes Absolute: 1.6 10*3/uL — ABNORMAL HIGH (ref 0.1–1.0)
Monocytes Relative: 16 %
Neutro Abs: 6.3 10*3/uL (ref 1.7–7.7)
Neutrophils Relative %: 64 %
Platelets: 265 10*3/uL (ref 150–400)
RBC: 5.62 MIL/uL (ref 4.22–5.81)
RDW: 14.6 % (ref 11.5–15.5)
WBC: 9.7 10*3/uL (ref 4.0–10.5)
nRBC: 0 % (ref 0.0–0.2)

## 2019-10-23 LAB — GASTROINTESTINAL PANEL BY PCR, STOOL (REPLACES STOOL CULTURE)

## 2019-10-23 LAB — COMPREHENSIVE METABOLIC PANEL
ALT: 18 U/L (ref 0–44)
AST: 20 U/L (ref 15–41)
Albumin: 4.1 g/dL (ref 3.5–5.0)
Alkaline Phosphatase: 78 U/L (ref 38–126)
Anion gap: 12 (ref 5–15)
BUN: 7 mg/dL (ref 6–20)
CO2: 25 mmol/L (ref 22–32)
Calcium: 8.9 mg/dL (ref 8.9–10.3)
Chloride: 101 mmol/L (ref 98–111)
Creatinine, Ser: 1 mg/dL (ref 0.61–1.24)
GFR calc Af Amer: >60 mL/min (ref >60)
GFR calc non Af Amer: >60 mL/min (ref >60)
Glucose, Bld: 100 mg/dL — ABNORMAL HIGH (ref 70–99)
Potassium: 3.3 mmol/L — ABNORMAL LOW (ref 3.5–5.1)
Sodium: 138 mmol/L (ref 135–145)
Total Bilirubin: 0.8 mg/dL (ref 0.3–1.2)
Total Protein: 8.3 g/dL — ABNORMAL HIGH (ref 6.5–8.1)

## 2019-10-23 LAB — C DIFFICILE QUICK SCREEN W PCR REFLEX
C Diff antigen: NEGATIVE
C Diff interpretation: NOT DETECTED
C Diff toxin: NEGATIVE

## 2019-10-23 LAB — LACTIC ACID, PLASMA: Lactic Acid, Venous: 1.4 mmol/L (ref 0.5–1.9)

## 2019-10-23 MED ORDER — IBUPROFEN 800 MG PO TABS
800.0000 mg | ORAL_TABLET | Freq: Once | ORAL | Status: AC
  Administered 2019-10-23: 800 mg via ORAL
  Filled 2019-10-23: qty 1

## 2019-10-23 MED ORDER — IOHEXOL 300 MG/ML  SOLN
100.0000 mL | Freq: Once | INTRAMUSCULAR | Status: AC | PRN
  Administered 2019-10-23: 100 mL via INTRAVENOUS

## 2019-10-23 MED ORDER — IOHEXOL 9 MG/ML PO SOLN
500.0000 mL | Freq: Once | ORAL | Status: AC | PRN
  Administered 2019-10-23: 500 mL via ORAL

## 2019-10-23 NOTE — ED Provider Notes (Addendum)
[facility] Emergency Department Provider Note   ____________________________________________   First MD Initiated Contact with Patient 10/23/19 1548     (approximate)  I have reviewed the triage vital signs and the nursing notes.   HISTORY  Chief Complaint Fever   HPI Leroy Hernandez is a 34 y.o. 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  Usually the drainage is bloody but now it is bloody with some clearing may be cloudy stuff and some black stuff as well.  There are some pain in the area.  Last night he had a fever of 103.5 with a thermometer he put on his forehead.  He is feeling okay now.  He is not having a cough or abdominal pain or vomiting.  He has chronic diarrhea from his Crohn's disease.         Past Medical History:  Diagnosis Date  . Anxiety   . Asthma    as a child  . Blood in stool   . Calculus of gallbladder without cholecystitis without obstruction 12/25/2015  . Crohn's disease ([place])   . Depression   . Exacerbation of Crohn's disease ([place]) 09/10/2017  . Gallbladder polyp 12/25/2015  . Gastroenteritis due to norovirus 05/03/2017  . Generalized abdominal pain   . H/O Clostridium difficile infection 05/03/2017  . Iron deficiency anemia 12/28/2016  . Iron deficiency anemia 12/28/2016  . Perirectal abscess 07/04/2015   Overview:  Added automatically from request for surgery [phone]  . Rectal fistula   . Sepsis ([place]) 06/22/2016    Patient Active Problem List   Diagnosis Date Noted  . Crohn's colitis, unspecified complication ([place]) 91/63/8466  . Crohn's disease of anal canal ([place]) 11/29/2018  . Other long term (current) drug therapy 11/14/2018  . Crohn's disease of both small and large intestine with other complication ([place]) 59/93/5701  . Iron deficiency anemia 12/28/2016  . Osteoporosis 12/09/2016  . Perirectal fistula   . Fracture of metacarpal bone 06/09/2016  . Intestinal  bypass or anastomosis status   . 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 Asthma, mild intermittent 04/02/2015    Past Surgical History:  Procedure Laterality Date  . COLON RESECTION    . COLON SURGERY    . COLONOSCOPY N/A 11/30/2018   Procedure: COLONOSCOPY;  Surgeon: Lin Landsman, MD;  Location: [facility] ENDOSCOPY;  Service: Gastroenterology;  Laterality: N/A;  . COLONOSCOPY WITH PROPOFOL N/A 04/03/2015   Procedure: COLONOSCOPY WITH PROPOFOL;  Surgeon: Hulen Luster, MD;  Location: [facility] ENDOSCOPY;  Service: Endoscopy;  Laterality: N/A;  . COLONOSCOPY WITH PROPOFOL N/A 12/22/2015   Procedure: COLONOSCOPY WITH PROPOFOL;  Surgeon: [name], MD;  Location: [place];  Service: Endoscopy;  Laterality: N/A;  . COLONOSCOPY WITH PROPOFOL N/A 12/22/2016   Procedure: COLONOSCOPY WITH PROPOFOL;  Surgeon: Lin Landsman, MD;  Location: [place];  Service: Endoscopy;  Laterality: N/A;  . COLONOSCOPY WITH PROPOFOL N/A 09/14/2017   Procedure: COLONOSCOPY WITH PROPOFOL;  Surgeon: Lin Landsman, MD;  Location: [facility] ENDOSCOPY;  Service: Gastroenterology;  Laterality: N/A;  . ESOPHAGOGASTRODUODENOSCOPY (EGD) WITH PROPOFOL N/A 12/22/2016   Procedure: ESOPHAGOGASTRODUODENOSCOPY (EGD) WITH PROPOFOL;  Surgeon: Lin Landsman, MD;  Location: [place];  Service: Endoscopy;  Laterality: N/A;  . rectal fistula surgery    . WRIST SURGERY      Prior to Admission medications   Medication Sig Start Date End Date Taking? Authorizing  Provider  ARIPiprazole (ABILIFY) 5 MG tablet Take 5 mg by mouth daily. 12/15/18  Yes [provider]  ciprofloxacin (CIPRO) 500 MG tablet Take 500 mg by mouth 2 (two) times daily. 10/22/19  Yes [provider]  divalproex (DEPAKOTE ER) 500 MG 24 hr tablet Take 1,000 mg by mouth at bedtime. 08/03/19  Yes [provider]  escitalopram (LEXAPRO) 10 MG tablet Take 1 tablet (10 mg total) by  mouth daily. 02/20/18  Yes Ursula Alert, MD  hydrOXYzine (VISTARIL) 25 MG capsule Take 1 capsule (25 mg total) by mouth daily as needed (severe anxiety attacks). 07/15/17  Yes Ursula Alert, MD  loperamide (IMODIUM) 2 MG capsule Take by mouth 4 (four) times daily as needed. 03/20/19  Yes [provider]  metroNIDAZOLE (FLAGYL) 500 MG tablet Take 500 mg by mouth 3 (three) times daily. 10/22/19  Yes [provider]  STELARA 90 MG/ML SOSY injection Inject 90 mg into the skin every 8 (eight) weeks.  09/19/19  Yes [provider]    Allergies Peanut oil  Family History  Problem Relation Age of Onset  . Diabetes Paternal Grandfather   . Heart disease Paternal Grandfather   . Depression Mother   . Drug abuse Mother   . Osteoporosis Neg Hx     Social History Social History   Tobacco Use  . Smoking status: Never Smoker  . Smokeless tobacco: Former Network engineer  . Vaping Use: Never used  Substance Use Topics  . Alcohol use: Yes    Comment: once a month  . Drug use: No    Comment: hx of 3 year ago     Review of Systems  Constitutional: No fever/chills Eyes: No visual changes. ENT: No sore throat. Cardiovascular: Denies chest pain. Respiratory: Denies shortness of breath. Gastrointestinal: No abdominal pain.  No nausea, no vomiting.  No diarrhea.  No constipation. Genitourinary: Negative for dysuria. Musculoskeletal: Negative for back pain. Skin: Negative for rash. Neurological: Negative for headaches, focal weakness   ____________________________________________   PHYSICAL EXAM:  VITAL SIGNS: ED Triage Vitals  Enc Vitals Group     BP 10/23/19 0937 124/77     Pulse Rate 10/23/19 0937 99     Resp 10/23/19 0937 16     Temp 10/23/19 0937 98.9 F (37.2 C)     Temp Source 10/23/19 0937 Oral     SpO2 10/23/19 0937 100 %     Weight 10/23/19 0938 140 lb (63.5 kg)     Height 10/23/19 0938 5' 5"  (1.651 m)     Head Circumference --      Peak Flow  --      Pain Score 10/23/19 0949 5     Pain Loc --      Pain Edu? --      Excl. in [place]? --     Constitutional: Alert and oriented. Well appearing and in no acute distress. Eyes: Conjunctivae are normal. PER. EOMI. Head: Atraumatic. Nose: No congestion/rhinnorhea. Mouth/Throat: Mucous membranes are moist.   Neck: No stridor  Cardiovascular: Normal rate, regular rhythm. Grossly normal heart sounds.  Good peripheral circulation. Respiratory: Normal respiratory effort.  No retractions. Lungs CTAB. Gastrointestinal: Soft and nontender. No distention. No abdominal bruits.  Genitourinary: Fistulous track with some skin changes on the right side of the buttocks.  There is no swelling or mass only a very limited amount of redness immediately around the fistula site Musculoskeletal: No lower extremity tenderness nor edema.  Neurologic:  Normal  speech and language. No gross focal neurologic deficits are appreciated.  Skin:  Skin is warm, dry and intact. No rash noted.   ____________________________________________   LABS (all labs ordered are listed, but only abnormal results are displayed)  Labs Reviewed  COMPREHENSIVE METABOLIC PANEL - Abnormal; Notable for the following components:      Result Value   Potassium 3.3 (*)    Glucose, Bld 100 (*)    Total Protein 8.3 (*)    All other components within normal limits  CBC WITH DIFFERENTIAL/PLATELET - Abnormal; Notable for the following components:   MCV 78.1 (*)    Monocytes Absolute 1.6 (*)    All other components within normal limits  C DIFFICILE QUICK SCREEN W PCR REFLEX  GASTROINTESTINAL PANEL BY PCR, STOOL (REPLACES STOOL CULTURE)  LACTIC ACID, PLASMA  LACTIC ACID, PLASMA   ____________________________________________  EKG   ____________________________________________  RADIOLOGY  ED MD interpretation: Chest x-ray read by radiology reviewed by me is negative.  Official radiology report(s): CT ABDOMEN PELVIS W  CONTRAST  Result Date: 10/23/2019 CLINICAL DATA:  Right gluteal fistula, Crohn's disease, fever EXAM: CT ABDOMEN AND PELVIS WITH CONTRAST TECHNIQUE: Multidetector CT imaging of the abdomen and pelvis was performed using the standard protocol following bolus administration of intravenous contrast. CONTRAST:  131m OMNIPAQUE IOHEXOL 300 MG/ML  SOLN COMPARISON:  09/03/2019 FINDINGS: Lower chest: The visualized lung bases are clear bilaterally. The visualized heart and pericardium are unremarkable. Hepatobiliary: Cholelithiasis is present without associated gallbladder distension or pericholecystic inflammatory stranding. Liver unremarkable. No intra or extrahepatic biliary ductal dilation. Pancreas: Unremarkable Spleen: Unremarkable Adrenals/Urinary Tract: Adrenal glands are unremarkable. Kidneys are normal, without renal calculi, focal lesion, or hydronephrosis. Bladder is unremarkable. Stomach/Bowel: The stomach and small bowel are unremarkable. Surgical changes of right hemicolectomy are identified. No evidence of obstruction or focal inflammation. Previously noted inflammatory change at the surgical anastomosis and associated partial small bowel obstruction has resolved. Right gluteal perianal fistula is again seen and appears stable when compared to prior examination. This appears to originate from the rectum at the level of the levator ani at the 7 o'clock position, best seen on axial image # 76 and coronal image # 54. There is no associated discrete drainable fluid collection. Vascular/Lymphatic: No significant vascular findings are present. No enlarged abdominal or pelvic lymph nodes. Reproductive: Prostate is unremarkable. Other: No abdominal wall hernia identified Musculoskeletal: No acute or significant osseous findings. IMPRESSION: 1. Stable right gluteal perianal fistula. No associated drainable fluid collection. 2. Cholelithiasis without evidence of acute cholecystitis. 3. Surgical changes of right  hemicolectomy. Previously noted inflammatory change at the surgical anastomosis and associated partial small bowel obstruction has resolved. Electronically Signed   By: AFidela SalisburyMD   On: 10/23/2019 19:13   DG Chest Portable 1 View  Result Date: 10/23/2019 CLINICAL DATA:  Provided history: High fever last night. Additional history provided: Fever 103.5 degrees Fahrenheit 10/22/2019, fistula on bottom, legs and feet hurting, vomiting last night, history of Crohn's, history of asthma as a child, history of C diff sepsis in 2018. EXAM: PORTABLE CHEST 1 VIEW COMPARISON:  Prior chest radiographs 11/07/2018. Report from prior chest radiographs 06/11/2019 (images unavailable). FINDINGS: Heart size within normal limits. There is no appreciable airspace consolidation. No evidence of pleural effusion or pneumothorax. No acute bony abnormality identified. IMPRESSION: No evidence of active cardiopulmonary disease. Electronically Signed   By: KKellie SimmeringDO   On: 10/23/2019 16:31    ____________________________________________   PROCEDURES  Procedure(s) performed (  including Critical Care):  Procedures   ____________________________________________   INITIAL IMPRESSION / ASSESSMENT AND PLAN / ED COURSE Patient's lab work stool studies chest x-ray CT scan and exam do not show reason for his fever.  He is not running a fever now in fact his temperature on discharge was 97.8.  I will let him go and ask him to return if he gets worse at all.  I will have him follow-up with his regular doctor.  I am uncertain what caused his fever yesterday.  He could have been a virus or something else.  Right now everything looks okay.  I did not hear anything in his lungs.  His heart did not have any murmurs that I could hear.  His belly was nontender again I am not sure what caused his fever yesterday.               ____________________________________________   FINAL CLINICAL IMPRESSION(S) / ED  DIAGNOSES  Final diagnoses:  History of fever     ED Discharge Orders    None      *Please note:  Leroy Hernandez was evaluated in Emergency Department on 10/23/2019 for the symptoms described in the history of present illness. He was evaluated in the context of the global COVID-19 pandemic, which necessitated consideration that the patient might be at risk for infection with the SARS-CoV-2 virus that causes COVID-19. Institutional protocols and algorithms that pertain to the evaluation of patients at risk for COVID-19 are in a state of rapid change based on information released by regulatory bodies including the CDC and federal and state organizations. These policies and algorithms were followed during the patient's care in the ED.  Some ED evaluations and interventions may be delayed as a result of limited staffing during and the pandemic.*   Note:  This document was prepared using Dragon voice recognition software and may include unintentional dictation errors.    Nena Polio, MD 10/23/19 2035    Nena Polio, MD 10/23/19 2036

## 2019-10-23 NOTE — ED Notes (Signed)
Assumed care of pt at 1900, pt resting in bed awaiting CT results. Reporting mild R buttock pain relieved by pressure. Reports red blood noted in diarrhea today. Denies needs ro concerrns at

## 2019-10-23 NOTE — ED Triage Notes (Signed)
Patient to ER for c/o drainage from fistula to right buttock near rectum. Patient has h/o Crohn's disease, has had fistula for some time. Now having increased drainage from fistula with cloudy drainage. Patient also reports having fever of 104 last night. +Generalized weakness.

## 2019-10-23 NOTE — Discharge Instructions (Addendum)
Please follow-up with your doctor.  Please return for fever, abdominal pain, coughing or any other problems or feeling sicker at all.  The test that we did today including the C. difficile stool PCR white blood count and CT do not show the cause for your fever.

## 2019-12-31 ENCOUNTER — Other Ambulatory Visit: Payer: Self-pay

## 2019-12-31 ENCOUNTER — Encounter (HOSPITAL_COMMUNITY): Payer: Self-pay | Admitting: *Deleted

## 2019-12-31 ENCOUNTER — Emergency Department (HOSPITAL_COMMUNITY)
Admission: EM | Admit: 2019-12-31 | Discharge: 2019-12-31 | Disposition: A | Discharge status: Home / Self Care | Payer: No Typology Code available for payment source | Attending: Emergency Medicine | Admitting: Emergency Medicine

## 2019-12-31 ENCOUNTER — Emergency Department (HOSPITAL_COMMUNITY): Payer: No Typology Code available for payment source

## 2019-12-31 DIAGNOSIS — Y990 Civilian activity done for income or pay: Secondary | ICD-10-CM | POA: Insufficient documentation

## 2019-12-31 DIAGNOSIS — S0990XA Unspecified injury of head, initial encounter: Secondary | ICD-10-CM | POA: Diagnosis present

## 2019-12-31 DIAGNOSIS — J4520 Mild intermittent asthma, uncomplicated: Secondary | ICD-10-CM | POA: Insufficient documentation

## 2019-12-31 DIAGNOSIS — W2209XA Striking against other stationary object, initial encounter: Secondary | ICD-10-CM | POA: Insufficient documentation

## 2019-12-31 DIAGNOSIS — Z91010 Allergy to peanuts: Secondary | ICD-10-CM | POA: Diagnosis not present

## 2019-12-31 DIAGNOSIS — Y9389 Activity, other specified: Secondary | ICD-10-CM | POA: Insufficient documentation

## 2019-12-31 MED ORDER — ACETAMINOPHEN 500 MG PO TABS
1000.0000 mg | ORAL_TABLET | Freq: Once | ORAL | Status: AC
  Administered 2019-12-31: 1000 mg via ORAL
  Filled 2019-12-31: qty 2

## 2019-12-31 NOTE — ED Notes (Signed)
Pt to CT

## 2019-12-31 NOTE — ED Triage Notes (Addendum)
Pt reports he hit his top right side of head on the corner of a metal window seal today about 1500. Pt reports his head was bleeding at the time, but has now stopped. Denies use of blood thinners and LOC. Pt reports he felt dizzy headed after the accident, but only slightly now. Reports last tetanus 1 year ago.

## 2019-12-31 NOTE — ED Provider Notes (Signed)
[facility] EMERGENCY DEPARTMENT Provider Note   CSN: [id number] Arrival date & time: 12/31/19  1644     History Chief Complaint  Patient presents with  . Head Injury    Leroy Hernandez is a 34 y.o. male.  HPI    Patient with significant medical history of asthma, anxiety, presents to the emergency department with chief complaint of head trauma.  Patient states while he was at work today he was searching someone's room and hit his head on top of a windowsill.  He denies falling, losing conscious, is not on anticoags.  He endorses that he felt "swimmy headed" but denies headaches, change in vision, paresthesias or weakness in the upper or lower extremities.  He denies any nausea vomiting or amnesia.  He does endorse that he still feels a tad "swimmy headed" now and felt a little dizzy when he stands up.  He did not he is not taking any pain medications denies any sort of relieving factors at this time.  Patient denies nasal congestion, sore throat, chest pain, shortness of breath, abdominal pain, nausea, vomiting, diarrhea, pedal edema.  Past Medical History:  Diagnosis Date  . Anxiety   . Asthma    as a child  . Blood in stool   . Calculus of gallbladder without cholecystitis without obstruction 12/25/2015  . Crohn's disease ([place])   . Depression   . Exacerbation of Crohn's disease ([place]) 09/10/2017  . Gallbladder polyp 12/25/2015  . Gastroenteritis due to norovirus 05/03/2017  . Generalized abdominal pain   . H/O Clostridium difficile infection 05/03/2017  . Iron deficiency anemia 12/28/2016  . Iron deficiency anemia 12/28/2016  . Perirectal abscess 07/04/2015   Overview:  Added automatically from request for surgery [id number]  . Rectal fistula   . Sepsis ([place]) 06/22/2016    Patient Active Problem List   Diagnosis Date Noted  . Crohn's colitis, unspecified complication ([place]) 85/27/7824  . Crohn's disease of anal canal ([place]) 11/29/2018  . Other long term (current) drug therapy  11/14/2018  . Crohn's disease of both small and large intestine with other complication ([place]) 23/53/6144  . Iron deficiency anemia 12/28/2016  . Osteoporosis 12/09/2016  . Perirectal fistula   . Fracture of metacarpal bone 06/09/2016  . Intestinal bypass or anastomosis status   . 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 Asthma, mild intermittent 04/02/2015    Past Surgical History:  Procedure Laterality Date  . COLON RESECTION    . COLON SURGERY    . COLONOSCOPY N/A 11/30/2018   Procedure: COLONOSCOPY;  Surgeon: Lin Landsman, MD;  Location[facility] END[facility]COPY;  Service: Gastroenterology;  Laterality: N/A;  . COLONOSCOPY WITH PROPOFOL N/A 04/03/2015   Procedure: COLONOSCOPY WITH PROPOFOL;  Surgeon: Hulen Luster, MD;  Location: [facility] ENDOSCOPY;  Service: Endoscopy;  Laterality: N/A;  . COLONOSCOPY WITH PROPOFOL N/A 12/22/2015   Procedure: COLONOSCOPY WITH PROPOFOL;  Surgeon: [name], MD;  Location: [place];  Service: Endoscopy;  Laterality: N/A;  . COLONOSCOPY WITH PROPOFOL N/A 12/22/2016   Procedure: COLONOSCOPY WITH PROPOFOL;  Surgeon: Lin Landsman, MD;  Location: [place];  Service: Endoscopy;  Laterality: N/A;  . COLONOSCOPY WITH PROPOFOL N/A 09/14/2017   Procedure: COLONOSCOPY WITH PROPOFOL;  Surgeon: Lin Landsman, MD;  Location: [facility] ENDOSCOPY;  Service: Gastroenterology;  Laterality: N/A;  . ESOPHAGOGASTRODUODENOSCOPY (EGD) WITH PROPOFOL N/A 12/22/2016   Procedure: ESOPHAGOGASTRODUODENOSCOPY (EGD) WITH PROPOFOL;  Surgeon: Lin Landsman, MD;  Location: [place];  Service: Endoscopy;  Laterality: N/A;  . rectal fistula surgery    . RECTAL SURGERY  2021   drainage tube placed in rectum due to recurrent infections   . WRIST SURGERY         Family History  Problem Relation Age of Onset  . Diabetes Paternal Grandfather   . Heart disease Paternal Grandfather   . Depression Mother   . Drug abuse  Mother   . Osteoporosis Neg Hx     Social History   Tobacco Use  . Smoking status: Never Smoker  . Smokeless tobacco: Former Network engineer  . Vaping Use: Never used  Substance Use Topics  . Alcohol use: Yes    Comment: once a month  . Drug use: No    Comment: hx of 3 year ago     Home Medications Prior to Admission medications   Medication Sig Start Date End Date Taking? Authorizing Provider  ARIPiprazole (ABILIFY) 5 MG tablet Take 5 mg by mouth daily. 12/15/18   [provider]  ciprofloxacin (CIPRO) 500 MG tablet Take 500 mg by mouth 2 (two) times daily. 10/22/19   [provider]  divalproex (DEPAKOTE ER) 500 MG 24 hr tablet Take 1,000 mg by mouth at bedtime. 08/03/19   [provider]  escitalopram (LEXAPRO) 10 MG tablet Take 1 tablet (10 mg total) by mouth daily. 02/20/18   Ursula Alert, MD  hydrOXYzine (VISTARIL) 25 MG capsule Take 1 capsule (25 mg total) by mouth daily as needed (severe anxiety attacks). 07/15/17   Ursula Alert, MD  loperamide (IMODIUM) 2 MG capsule Take by mouth 4 (four) times daily as needed. 03/20/19   [provider]  metroNIDAZOLE (FLAGYL) 500 MG tablet Take 500 mg by mouth 3 (three) times daily. 10/22/19   [provider]  STELARA 90 MG/ML SOSY injection Inject 90 mg into the skin every 8 (eight) weeks.  09/19/19   [provider]    Allergies    Peanut oil  Review of Systems   Review of Systems  Constitutional: Negative for chills and fever.  HENT: Negative for congestion.   Eyes: Negative for visual disturbance.  Respiratory: Negative for shortness of breath.   Cardiovascular: Negative for chest pain.  Gastrointestinal: Negative for abdominal pain, diarrhea, nausea and vomiting.  Genitourinary: Negative for enuresis.  Musculoskeletal: Negative for back pain.  Skin: Negative for rash.  Neurological: Positive for dizziness.  Hematological: Does not bruise/bleed easily.    Physical  Exam Updated Vital Signs BP 112/70   Pulse 73   Temp 98.4 F (36.9 C) (Oral)   Resp 15   Ht 5' 5"  (1.651 m)   Wt 63.5 kg   SpO2 97%   BMI 23.30 kg/m   Physical Exam Vitals and nursing note reviewed.  Constitutional:      General: He is not in acute distress.    Appearance: Normal appearance. He is not ill-appearing or diaphoretic.  HENT:     Head: Normocephalic and atraumatic.     Comments: Patient has a small noted abrasion on the right parietal lobe.  It was hemodynamically stable, no crepitus or deformities felt upon palpation.    Nose: No congestion.  Eyes:     General: No visual field deficit or scleral icterus.    Extraocular Movements: Extraocular movements intact.     Conjunctiva/sclera: Conjunctivae normal.     Pupils: Pupils are equal, round, and reactive to light.  Cardiovascular:     Rate and  Rhythm: Normal rate and regular rhythm.     Pulses: Normal pulses.     Heart sounds: No murmur heard.  No friction rub. No gallop.   Pulmonary:     Effort: Pulmonary effort is normal. No respiratory distress.     Breath sounds: No wheezing, rhonchi or rales.  Abdominal:     Palpations: Abdomen is soft.     Tenderness: There is no guarding.  Musculoskeletal:        General: No swelling or tenderness.     Right lower leg: No edema.     Left lower leg: No edema.     Comments: Moving all 4 extremities out any difficulty.  Skin:    General: Skin is warm and dry.  Neurological:     General: No focal deficit present.     Mental Status: He is alert.     GCS: GCS eye subscore is 4. GCS verbal subscore is 5. GCS motor subscore is 6.     Cranial Nerves: Cranial nerves are intact. No cranial nerve deficit or facial asymmetry.     Sensory: Sensation is intact. No sensory deficit.     Motor: Motor function is intact. No weakness or pronator drift.     Coordination: Coordination is intact. Romberg sign negative. Finger-Nose-Finger Test and Heel to Baldwin Test normal.     Comments:  Has no difficulty with word finding  Psychiatric:        Mood and Affect: Mood normal.     ED Results / Procedures / Treatments   Labs (all labs ordered are listed, but only abnormal results are displayed) Labs Reviewed - No data to display  EKG None  Radiology CT Head Wo Contrast  Result Date: 12/31/2019 CLINICAL DATA:  Moderate severe head trauma. EXAM: CT HEAD WITHOUT CONTRAST TECHNIQUE: Contiguous axial images were obtained from the base of the skull through the vertex without intravenous contrast. COMPARISON:  None. FINDINGS: Brain: No evidence of large-territorial acute infarction. No parenchymal hemorrhage. No mass lesion. No extra-axial collection. Posterior fossa cystic lesion on the left is likely benign. Otherwise no mass effect or midline shift. No hydrocephalus. Basilar cisterns are patent. Vascular: No hyperdense vessel. Skull: No acute fracture or focal lesion. Sinuses/Orbits: Paranasal sinuses and mastoid air cells are clear. The orbits are unremarkable. Other: Trace subcutaneus soft tissue edema along the right frontal scalp. No retained radiopaque foreign body. IMPRESSION: No acute intracranial abnormality. Electronically Signed   By: Iven Finn M.D.   On: 12/31/2019 19:24    Procedures Procedures (including critical care time)  Medications Ordered in ED Medications  acetaminophen (TYLENOL) tablet 1,000 mg (1,000 mg Oral Given 12/31/19 1839)    ED Course  I have reviewed the triage vital signs and the nursing notes.  Pertinent labs & imaging results that were available during my care of the patient were reviewed by me and considered in my medical decision making (see chart for details).    MDM Rules/Calculators/A&P                          Patient presents with head trauma.  He was alert, does not appear in acute distress, vital signs reassuring.  Will order CT head for further evaluation as patient continued to have "swimmy head".  Will defer closing  patient's wound on the right parietal lobe as it was an a abrasion versus,  it was hemodynamically stable and harm outweighed benefit  Patient's head CT  was negative for acute findings  low suspicion for CVA or intracranial head bleed as patient denies change in vision, paresthesias or weakness to upper lower extremities, no neuro deficits noted on exam, CT head did not reveal any acute findings.  Low suspicion for ACS or arrhythmias as patient denies chest pain, shortness of breath, no hypoperfusion or fluid overload on exam.  Low suspicion for systemic infection as patient is nontoxic-appearing, vital signs reassuring, no obvious source infection noted on exam.   Patient has an up-to-date tetanus shot.  Suspect patient may have a slight concussion after hitting his head.  Will recommend over-the-counter pain medications follow-up with PCP if symptoms persist over 1 week's time.  Vital signs have remained stable, no indication for hospital admission. Patient given at home care as well strict return precautions.  Patient verbalized that they understood agreed to said plan.   Final Clinical Impression(s) / ED Diagnoses Final diagnoses:  Injury of head, initial encounter    Rx / DC Orders ED Discharge Orders    None       Marcello Fennel, PA-C 12/31/19 [place], MD 01/15/20 [phone]

## 2019-12-31 NOTE — Discharge Instructions (Signed)
Seen here after a head injury.  Exam and imaging all looks reassuring.  I recommend over-the-counter pain medications like ibuprofen and or Tylenol every 6 hours as needed please follow dosing the back of bottle.  Would recommend following up with your PCP in 1 week's time if you have continued headaches.  Come back to the emergency department if you develop chest pain, shortness of breath, severe abdominal pain, uncontrolled nausea, vomiting, diarrhea.

## 2020-05-10 ENCOUNTER — Encounter: Payer: Self-pay | Admitting: Intensive Care

## 2020-05-10 ENCOUNTER — Other Ambulatory Visit: Payer: Self-pay

## 2020-05-10 ENCOUNTER — Emergency Department
Admission: EM | Admit: 2020-05-10 | Discharge: 2020-05-10 | Disposition: A | Discharge status: Home / Self Care | Payer: BC Managed Care – PPO | Attending: Emergency Medicine | Admitting: Emergency Medicine

## 2020-05-10 DIAGNOSIS — J4520 Mild intermittent asthma, uncomplicated: Secondary | ICD-10-CM | POA: Diagnosis not present

## 2020-05-10 DIAGNOSIS — R42 Dizziness and giddiness: Secondary | ICD-10-CM | POA: Diagnosis present

## 2020-05-10 DIAGNOSIS — R531 Weakness: Secondary | ICD-10-CM | POA: Insufficient documentation

## 2020-05-10 DIAGNOSIS — E860 Dehydration: Secondary | ICD-10-CM

## 2020-05-10 DIAGNOSIS — R5383 Other fatigue: Secondary | ICD-10-CM | POA: Insufficient documentation

## 2020-05-10 DIAGNOSIS — Z91010 Allergy to peanuts: Secondary | ICD-10-CM | POA: Diagnosis not present

## 2020-05-10 LAB — URINALYSIS, COMPLETE (UACMP) WITH MICROSCOPIC
Bacteria, UA: NONE SEEN
Bilirubin Urine: NEGATIVE
Glucose, UA: NEGATIVE mg/dL
Hgb urine dipstick: NEGATIVE
Ketones, ur: 5 mg/dL — AB
Leukocytes,Ua: NEGATIVE
Nitrite: NEGATIVE
Protein, ur: NEGATIVE mg/dL
Specific Gravity, Urine: 1.024 (ref 1.005–1.030)
Squamous Epithelial / HPF: NONE SEEN (ref 0–5)
pH: 6 (ref 5.0–8.0)

## 2020-05-10 LAB — BASIC METABOLIC PANEL WITH GFR
Anion gap: 7 (ref 5–15)
BUN: 7 mg/dL (ref 6–20)
CO2: 26 mmol/L (ref 22–32)
Calcium: 8.5 mg/dL — ABNORMAL LOW (ref 8.9–10.3)
Chloride: 107 mmol/L (ref 98–111)
Creatinine, Ser: 0.86 mg/dL (ref 0.61–1.24)
GFR, Estimated: >60 mL/min
Glucose, Bld: 89 mg/dL (ref 70–99)
Potassium: 3.7 mmol/L (ref 3.5–5.1)
Sodium: 140 mmol/L (ref 135–145)

## 2020-05-10 LAB — CBC
HCT: 41.7 % (ref 39.0–52.0)
Hemoglobin: 13.1 g/dL (ref 13.0–17.0)
MCH: 24.4 pg — ABNORMAL LOW (ref 26.0–34.0)
MCHC: 31.4 g/dL (ref 30.0–36.0)
MCV: 77.7 fL — ABNORMAL LOW (ref 80.0–100.0)
Platelets: 340 10*3/uL (ref 150–400)
RBC: 5.37 MIL/uL (ref 4.22–5.81)
RDW: 13.1 % (ref 11.5–15.5)
WBC: 10.6 10*3/uL — ABNORMAL HIGH (ref 4.0–10.5)
nRBC: 0 % (ref 0.0–0.2)

## 2020-05-10 MED ORDER — SODIUM CHLORIDE 0.9 % IV BOLUS
1000.0000 mL | Freq: Once | INTRAVENOUS | Status: AC
  Administered 2020-05-10: 1000 mL via INTRAVENOUS

## 2020-05-10 NOTE — ED Provider Notes (Signed)
[facility] Emergency Department Provider Note  Time seen: 12:20 PM  I have reviewed the triage vital signs and the nursing notes.   HISTORY  Chief Complaint Weakness and Dizziness   HPI Leroy Hernandez is a 35 y.o. male with a past medical history of anxiety, asthma, Crohn's disease, iron deficiency anemia, presents emergency department for dizziness and lightheadedness.  According to the patient for the past several weeks he has been feeling dizzy and lightheaded but feels like it is getting somewhat worse.  Patient states he was seen by his doctor and they ordered an iron transfusion for the patient next month.  Patient is concerned that his blood counts or iron levels could have dropped which is causing him to get dizzy/lightheaded.  Denies any chest pain cough or shortness of breath.  No fever.  No abdominal pain does state intermittent diarrhea but states that is chronic due to his Crohn's disease.   Past Medical History:  Diagnosis Date  . Anxiety   . Asthma    as a child  . Blood in stool   . Calculus of gallbladder without cholecystitis without obstruction 12/25/2015  . Crohn's disease ([place])   . Depression   . Exacerbation of Crohn's disease ([place]) 09/10/2017  . Gallbladder polyp 12/25/2015  . Gastroenteritis due to norovirus 05/03/2017  . Generalized abdominal pain   . H/O Clostridium difficile infection 05/03/2017  . Iron deficiency anemia 12/28/2016  . Iron deficiency anemia 12/28/2016  . Perirectal abscess 07/04/2015   Overview:  Added automatically from request for surgery [id number]  . Rectal fistula   . Sepsis ([place]) 06/22/2016    Patient Active Problem List   Diagnosis Date Noted  . Crohn's colitis, unspecified complication ([place]) 75/64/3329  . Crohn's disease of anal canal ([place]) 11/29/2018  . Other long term (current) drug therapy 11/14/2018  . Crohn's disease of both small and large intestine with other complication ([place]) 51/88/4166  . Iron  deficiency anemia 12/28/2016  . Osteoporosis 12/09/2016  . Perirectal fistula   . Fracture of metacarpal bone 06/09/2016  . Intestinal bypass or anastomosis status   . 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 Asthma, mild intermittent 04/02/2015    Past Surgical History:  Procedure Laterality Date  . COLON RESECTION    . COLON SURGERY    . COLONOSCOPY N/A 11/30/2018   Procedure: COLONOSCOPY;  Surgeon: Lin Landsman, MD;  Location: [facility] ENDOSCOPY;  Service: Gastroenterology;  Laterality: N/A;  . COLONOSCOPY WITH PROPOFOL N/A 04/03/2015   Procedure: COLONOSCOPY WITH PROPOFOL;  Surgeon: Hulen Luster, MD;  Location[facility] ENDOSCOPY;  Service: Endoscopy;  Laterality: N/A;  . COLONOSCOPY WITH PROPOFOL N/A 12/22/2015   Procedure: COLONOSCOPY WITH PROPOFOL;  Surgeon: [name], MD;  Location: [place];  Service: Endoscopy;  Laterality: N/A;  . COLONOSCOPY WITH PROPOFOL N/A 12/22/2016   Procedure: COLONOSCOPY WITH PROPOFOL;  Surgeon: Lin Landsman, MD;  Location: [place];  Service: Endoscopy;  Laterality: N/A;  . COLONOSCOPY WITH PROPOFOL N/A 09/14/2017   Procedure: COLONOSCOPY WITH PROPOFOL;  Surgeon: Lin Landsman, MD;  Location: [facility] ENDOSCOPY;  Service: Gastroenterology;  Laterality: N/A;  . ESOPHAGOGASTRODUODENOSCOPY (EGD) WITH PROPOFOL N/A 12/22/2016   Procedure: ESOPHAGOGASTRODUODENOSCOPY (EGD) WITH PROPOFOL;  Surgeon: Lin Landsman, MD;  Location: [place];  Service: Endoscopy;  Laterality: N/A;  . rectal fistula surgery    . RECTAL SURGERY  2021   drainage tube placed in rectum due to recurrent  infections   . WRIST SURGERY      Prior to Admission medications   Medication Sig Start Date End Date Taking? Authorizing Provider  ARIPiprazole (ABILIFY) 5 MG tablet Take 5 mg by mouth daily. 12/15/18   [provider]  ciprofloxacin (CIPRO) 500 MG tablet Take 500 mg by mouth 2 (two) times daily.  10/22/19   [provider]  divalproex (DEPAKOTE ER) 500 MG 24 hr tablet Take 1,000 mg by mouth at bedtime. 08/03/19   [provider]  escitalopram (LEXAPRO) 10 MG tablet Take 1 tablet (10 mg total) by mouth daily. 02/20/18   Ursula Alert, MD  hydrOXYzine (VISTARIL) 25 MG capsule Take 1 capsule (25 mg total) by mouth daily as needed (severe anxiety attacks). 07/15/17   Ursula Alert, MD  loperamide (IMODIUM) 2 MG capsule Take by mouth 4 (four) times daily as needed. 03/20/19   [provider]  metroNIDAZOLE (FLAGYL) 500 MG tablet Take 500 mg by mouth 3 (three) times daily. 10/22/19   [provider]  STELARA 90 MG/ML SOSY injection Inject 90 mg into the skin every 8 (eight) weeks.  09/19/19   [provider]    Allergies  Allergen Reactions  . Peanut Oil Itching    Family History  Problem Relation Age of Onset  . Diabetes Paternal Grandfather   . Heart disease Paternal Grandfather   . Depression Mother   . Drug abuse Mother   . Osteoporosis Neg Hx     Social History Social History   Tobacco Use  . Smoking status: Never Smoker  . Smokeless tobacco: Former Network engineer  . Vaping Use: Never used  Substance Use Topics  . Alcohol use: Yes    Comment: once a month  . Drug use: No    Comment: hx of 3 year ago     Review of Systems Constitutional: Negative for fever.  Generalized fatigue/weakness times several weeks. Cardiovascular: Negative for chest pain. Respiratory: Negative for shortness of breath. Gastrointestinal: Negative for abdominal pain.  Intermittent diarrhea. Musculoskeletal: Negative for musculoskeletal complaints Neurological: Negative for headache All other ROS negative  ____________________________________________   PHYSICAL EXAM:  VITAL SIGNS: ED Triage Vitals  Enc Vitals Group     BP 05/10/20 1151 127/81     Pulse Rate 05/10/20 1151 82     Resp 05/10/20 1151 19     Temp 05/10/20 1151 98.4 F (36.9 C)      Temp Source 05/10/20 1151 Oral     SpO2 05/10/20 1151 100 %     Weight 05/10/20 1149 135 lb (61.2 kg)     Height 05/10/20 1149 5' 5"  (1.651 m)     Head Circumference --      Peak Flow --      Pain Score 05/10/20 1148 0     Pain Loc --      Pain Edu? --      Excl. in [place]? --    Constitutional: Alert and oriented. Well appearing and in no distress. Eyes: Normal exam ENT      Head: Normocephalic and atraumatic.      Mouth/Throat: Mucous membranes are moist. Cardiovascular: Normal rate, regular rhythm. No murmur Respiratory: Normal respiratory effort without tachypnea nor retractions. Breath sounds are clear  Gastrointestinal: Soft and nontender. No distention.   Musculoskeletal: Nontender with normal range of motion in all extremities.  Neurologic:  Normal speech and language. No gross focal neurologic deficits  Skin:  Skin is warm, dry and  intact.  Psychiatric: Mood and affect are normal.   ____________________________________________    EKG  EKG viewed and interpreted by myself shows normal sinus rhythm at 81 bpm with a narrow QRS, normal axis, normal intervals, nonspecific but no concerning ST changes.  ____________________________________________   INITIAL IMPRESSION / ASSESSMENT AND PLAN / ED COURSE  Pertinent labs & imaging results that were available during my care of the patient were reviewed by me and considered in my medical decision making (see chart for details).   Patient presents emergency department with generalized weakness/fatigue over the past several weeks but getting worse.  Overall the patient appears well, no distress.  Largely negative review of systems, reassuring physical exam, reassuring vitals.  We will IV hydrate while awaiting labs.  Patient agreeable to plan of care.  Patient's work-up is reassuring.  Hemoglobin 13.1.  No significant findings or abnormalities.  Urinalysis is normal besides small amount of ketones.  Patient has received a liter of  IV fluids.  Discussed with the patient continued supportive care at home and PCP follow-up.  Patient agreeable to plan of care.   Leroy Hernandez was evaluated in Emergency Department on 05/10/2020 for the symptoms described in the history of present illness. He was evaluated in the context of the global COVID-19 pandemic, which necessitated consideration that the patient might be at risk for infection with the SARS-CoV-2 virus that causes COVID-19. Institutional protocols and algorithms that pertain to the evaluation of patients at risk for COVID-19 are in a state of rapid change based on information released by regulatory bodies including the CDC and federal and state organizations. These policies and algorithms were followed during the patient's care in the ED.  ____________________________________________   FINAL CLINICAL IMPRESSION(S) / ED DIAGNOSES  Weakness   Harvest Dark, MD 05/10/20 1422

## 2020-05-10 NOTE — ED Triage Notes (Incomplete)
Patient c/o weakness and lightheadedness for months that

## 2020-05-10 NOTE — ED Triage Notes (Signed)
Patient c/o weakness and lightheadedness for months that has gotten worse over last week. Reports recent lab work showed low iron and is scheduled for iron infusion next month

## 2020-10-09 ENCOUNTER — Encounter: Payer: Self-pay | Admitting: Dermatology

## 2020-10-09 ENCOUNTER — Ambulatory Visit: Payer: BC Managed Care – PPO | Admitting: Dermatology

## 2020-10-09 ENCOUNTER — Other Ambulatory Visit: Payer: Self-pay

## 2020-10-09 DIAGNOSIS — L700 Acne vulgaris: Secondary | ICD-10-CM | POA: Diagnosis not present

## 2020-10-09 DIAGNOSIS — D229 Melanocytic nevi, unspecified: Secondary | ICD-10-CM

## 2020-10-09 DIAGNOSIS — L578 Other skin changes due to chronic exposure to nonionizing radiation: Secondary | ICD-10-CM

## 2020-10-09 DIAGNOSIS — Z85828 Personal history of other malignant neoplasm of skin: Secondary | ICD-10-CM

## 2020-10-09 DIAGNOSIS — D1800 Hemangioma unspecified site: Secondary | ICD-10-CM

## 2020-10-09 DIAGNOSIS — Z872 Personal history of diseases of the skin and subcutaneous tissue: Secondary | ICD-10-CM

## 2020-10-09 DIAGNOSIS — Z1283 Encounter for screening for malignant neoplasm of skin: Secondary | ICD-10-CM

## 2020-10-09 DIAGNOSIS — L814 Other melanin hyperpigmentation: Secondary | ICD-10-CM

## 2020-10-09 DIAGNOSIS — L821 Other seborrheic keratosis: Secondary | ICD-10-CM

## 2020-10-09 MED ORDER — ADAPALENE-BENZOYL PEROXIDE 0.3-2.5 % EX GEL: 1.0000 "application " | Freq: Every day | CUTANEOUS | 11 refills | Status: AC

## 2020-10-09 NOTE — Progress Notes (Signed)
Follow-Up Visit   Subjective  Leroy Hernandez is a 35 y.o. male who presents for the following: Total body skin exam (Hx of SCC L temple, hx of AKs).  He is got acne that he would like treated. The patient presents for Total-Body Skin Exam (TBSE) for skin cancer screening and mole check.  The following portions of the chart were reviewed this encounter and updated as appropriate:   Tobacco  Allergies  Meds  Problems  Med Hx  Surg Hx  Fam Hx     Review of Systems:  No other skin or systemic complaints except as noted in HPI or Assessment and Plan.  Objective  Well appearing patient in no apparent distress; mood and affect are within normal limits.  A full examination was performed including scalp, head, eyes, ears, nose, lips, neck, chest, axillae, abdomen, back, buttocks, bilateral upper extremities, bilateral lower extremities, hands, feet, fingers, toes, fingernails, and toenails. All findings within normal limits unless otherwise noted below.  L temple Well healed scar with no evidence of recurrence, no lymphadenopathy.    Assessment & Plan   Lentigines - Scattered tan macules - Due to sun exposure - Benign-appering, observe - Recommend daily broad spectrum sunscreen SPF 30+ to sun-exposed areas, reapply every 2 hours as needed. - Call for any changes  Seborrheic Keratoses - Stuck-on, waxy, tan-brown papules and/or plaques  - Benign-appearing - Discussed benign etiology and prognosis. - Observe - Call for any changes  Melanocytic Nevi - Tan-brown and/or pink-flesh-colored symmetric macules and papules - Benign appearing on exam today - Observation - Call clinic for new or changing moles - Recommend daily use of broad spectrum spf 30+ sunscreen to sun-exposed areas.   Hemangiomas - Red papules - Discussed benign nature - Observe - Call for any changes  Actinic Damage - Chronic condition, secondary to cumulative UV/sun exposure - diffuse scaly erythematous  macules with underlying dyspigmentation - Recommend daily broad spectrum sunscreen SPF 30+ to sun-exposed areas, reapply every 2 hours as needed.  - Staying in the shade or wearing long sleeves, sun glasses (UVA+UVB protection) and wide brim hats (4-inch brim around the entire circumference of the hat) are also recommended for sun protection.  - Call for new or changing lesions.  Skin cancer screening performed today.  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 Observe for recurrence. Call clinic for new or changing lesions.  Recommend regular skin exams, daily broad-spectrum spf 30+ sunscreen use, and photoprotection.    Acne vulgaris back, shoulders, chest Chronic persistent.  Likely present for a year or more. Start Epiduo Forte qhs to back, shoulders, chest  Adapalene-Benzoyl Peroxide (EPIDUO FORTE) 0.3-2.5 % GEL - back, shoulders, chest Apply 1 application topically at bedtime. Qhs to back, shoulders, chest for acne  Skin cancer screening History of PreCancerous Actinic Keratosis  - site(s) of PreCancerous Actinic Keratosis clear today. - these may recur and new lesions may form requiring treatment to prevent transformation into skin cancer - observe for new or changing spots and contact [facility] for appointment if occur - photoprotection with sun protective clothing; sunglasses and broad spectrum sunscreen with SPF of at least 30 + and frequent self skin exams recommended - yearly exams by a dermatologist recommended for persons with history of PreCancerous Actinic Keratoses   Return in about 1 year (around 10/09/2021) for TBSE, Hx of SCC, Hx of AKs.  I, Othelia Pulling, RMA, am acting as scribe for Sarina Ser, MD .  Documentation: I have reviewed the above documentation for accuracy and completeness, and I agree with the above.  Sarina Ser, MD

## 2020-10-09 NOTE — Patient Instructions (Signed)

## 2020-10-16 ENCOUNTER — Encounter: Payer: Self-pay | Admitting: Dermatology

## 2020-12-24 ENCOUNTER — Other Ambulatory Visit: Payer: Self-pay

## 2020-12-24 DIAGNOSIS — R109 Unspecified abdominal pain: Secondary | ICD-10-CM | POA: Diagnosis present

## 2020-12-24 DIAGNOSIS — R197 Diarrhea, unspecified: Secondary | ICD-10-CM | POA: Diagnosis not present

## 2020-12-24 DIAGNOSIS — J45909 Unspecified asthma, uncomplicated: Secondary | ICD-10-CM | POA: Diagnosis not present

## 2020-12-24 DIAGNOSIS — Z202 Contact with and (suspected) exposure to infections with a predominantly sexual mode of transmission: Secondary | ICD-10-CM | POA: Insufficient documentation

## 2020-12-24 DIAGNOSIS — F17200 Nicotine dependence, unspecified, uncomplicated: Secondary | ICD-10-CM | POA: Insufficient documentation

## 2020-12-24 DIAGNOSIS — Z91010 Allergy to peanuts: Secondary | ICD-10-CM | POA: Diagnosis not present

## 2020-12-24 DIAGNOSIS — Z85828 Personal history of other malignant neoplasm of skin: Secondary | ICD-10-CM | POA: Diagnosis not present

## 2020-12-24 DIAGNOSIS — R110 Nausea: Secondary | ICD-10-CM | POA: Diagnosis not present

## 2020-12-24 LAB — URINALYSIS, ROUTINE W REFLEX MICROSCOPIC
Bilirubin Urine: NEGATIVE
Glucose, UA: NEGATIVE mg/dL
Hgb urine dipstick: NEGATIVE
Ketones, ur: 20 mg/dL — AB
Leukocytes,Ua: NEGATIVE
Nitrite: NEGATIVE
Protein, ur: NEGATIVE mg/dL
Specific Gravity, Urine: 1.028 (ref 1.005–1.030)
pH: 5 (ref 5.0–8.0)

## 2020-12-24 LAB — CBC
HCT: 43.5 % (ref 39.0–52.0)
Hemoglobin: 14.3 g/dL (ref 13.0–17.0)
MCH: 27.3 pg (ref 26.0–34.0)
MCHC: 32.9 g/dL (ref 30.0–36.0)
MCV: 83.2 fL (ref 80.0–100.0)
Platelets: 335 10*3/uL (ref 150–400)
RBC: 5.23 MIL/uL (ref 4.22–5.81)
RDW: 11.9 % (ref 11.5–15.5)
WBC: 13.4 10*3/uL — ABNORMAL HIGH (ref 4.0–10.5)
nRBC: 0 % (ref 0.0–0.2)

## 2020-12-24 LAB — COMPREHENSIVE METABOLIC PANEL
ALT: 24 U/L (ref 0–44)
AST: 18 U/L (ref 15–41)
Albumin: 3.7 g/dL (ref 3.5–5.0)
Alkaline Phosphatase: 88 U/L (ref 38–126)
Anion gap: 9 (ref 5–15)
BUN: 11 mg/dL (ref 6–20)
CO2: 24 mmol/L (ref 22–32)
Calcium: 8.4 mg/dL — ABNORMAL LOW (ref 8.9–10.3)
Chloride: 103 mmol/L (ref 98–111)
Creatinine, Ser: 0.93 mg/dL (ref 0.61–1.24)
GFR, Estimated: >60 mL/min (ref >60)
Glucose, Bld: 89 mg/dL (ref 70–99)
Potassium: 3.7 mmol/L (ref 3.5–5.1)
Sodium: 136 mmol/L (ref 135–145)
Total Bilirubin: 1.2 mg/dL (ref 0.3–1.2)
Total Protein: 7.6 g/dL (ref 6.5–8.1)

## 2020-12-24 LAB — LIPASE, BLOOD: Lipase: 38 U/L (ref 11–51)

## 2020-12-24 NOTE — ED Triage Notes (Signed)
Pt  comes with c/o left sided pain for about week. Pt states hx of chron's disease and not currently being treated. Diarrhea and no vomiting

## 2020-12-25 ENCOUNTER — Emergency Department: Payer: BC Managed Care – PPO

## 2020-12-25 ENCOUNTER — Emergency Department
Admission: EM | Admit: 2020-12-25 | Discharge: 2020-12-25 | Disposition: A | Discharge status: Home / Self Care | Payer: BC Managed Care – PPO | Attending: Emergency Medicine | Admitting: Emergency Medicine

## 2020-12-25 DIAGNOSIS — R109 Unspecified abdominal pain: Secondary | ICD-10-CM

## 2020-12-25 DIAGNOSIS — Z202 Contact with and (suspected) exposure to infections with a predominantly sexual mode of transmission: Secondary | ICD-10-CM

## 2020-12-25 LAB — CHLAMYDIA/NGC RT PCR (ARMC ONLY)
Chlamydia Tr: NOT DETECTED
N gonorrhoeae: NOT DETECTED

## 2020-12-25 MED ORDER — FENTANYL CITRATE PF 50 MCG/ML IJ SOSY
PREFILLED_SYRINGE | INTRAMUSCULAR | Status: AC
  Filled 2020-12-25: qty 1

## 2020-12-25 MED ORDER — IOHEXOL 300 MG/ML  SOLN
100.0000 mL | Freq: Once | INTRAMUSCULAR | Status: AC | PRN
  Administered 2020-12-25: 04:00:00 100 mL via INTRAVENOUS

## 2020-12-25 MED ORDER — KETOROLAC TROMETHAMINE 30 MG/ML IJ SOLN
INTRAMUSCULAR | Status: AC
  Filled 2020-12-25: qty 1

## 2020-12-25 MED ORDER — ONDANSETRON HCL 4 MG/2ML IJ SOLN
INTRAMUSCULAR | Status: AC
  Filled 2020-12-25: qty 2

## 2020-12-25 MED ORDER — PREDNISONE 20 MG PO TABS: ORAL_TABLET | ORAL | 0 refills | Status: DC

## 2020-12-25 MED ORDER — ONDANSETRON 4 MG PO TBDP: 4.0000 mg | ORAL_TABLET | Freq: Three times a day (TID) | ORAL | 0 refills | Status: AC | PRN

## 2020-12-25 NOTE — Discharge Instructions (Signed)

## 2020-12-25 NOTE — ED Provider Notes (Signed)
[facility] Emergency Department Provider Note  ____________________________________________  Time seen: Approximately 2:28 AM  I have reviewed the triage vital signs and the nursing notes.   HISTORY  Chief Complaint Abdominal Pain   HPI Leroy Hernandez is a 35 y.o. male with a history of Crohn's disease who presents for evaluation of abdominal pain.  Patient reports that he has had constant pain on the left side of his abdomen for about a week.  The pain is sharp, currently 6 out of 10, associated with nausea.  No vomiting.  He does have diarrhea but reports that this is normal for him because of Crohn's.  Patient was previously on Stelara but the medication was not working so he stopped taking it.  He is supposed to be starting a new medication but has not received approval from his insurance company yet.  Patient is currently not on any therapy for Crohn's for the last 2 to 3 months.  He denies fever or chills, dysuria or hematuria.  He is passing flatus.  No chest pain or shortness of breath. Patient is also requesting to be tested for STD. Reports that 1 week ago he had sex with another person who later he found out had had sex with multiple other partners. Patient denies any penile discharge, scrotal pain, or dysuria   Past Medical History:  Diagnosis Date   Anxiety    Asthma    as a child   Blood in stool    Calculus of gallbladder without cholecystitis without obstruction 12/25/2015   Crohn's disease ([place])    Depression    Exacerbation of Crohn's disease ([place]) 09/10/2017   Gallbladder polyp 12/25/2015   Gastroenteritis due to norovirus 05/03/2017   Generalized abdominal pain    H/O Clostridium difficile infection 05/03/2017   Iron deficiency anemia 12/28/2016   Iron deficiency anemia 12/28/2016   Perirectal abscess 07/04/2015   Overview:  Added automatically from request for surgery [id number]   Rectal fistula    Sepsis ([place]) 06/22/2016    Squamous cell carcinoma of skin 07/19/2018   L temple    Patient Active Problem List   Diagnosis Date Noted   Crohn's colitis, unspecified complication ([place]) 07/09/5613   Crohn's disease of anal canal ([place]) 11/29/2018   Other long term (current) drug therapy 11/14/2018   Crohn's disease of both small and large intestine with other complication ([place]) 37/94/3276   Iron deficiency anemia 12/28/2016   Osteoporosis 12/09/2016   Perirectal fistula    Fracture of metacarpal bone 06/09/2016   Intestinal bypass or anastomosis status    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 ENDOSCOPY;  Service: Endoscopy;  Laterality: N/A;   COLONOSCOPY WITH PROPOFOL N/A 12/22/2015   Procedure: COLONOSCOPY WITH PROPOFOL;  Surgeon: [name], MD;  Location: [place];  Service: Endoscopy;  Laterality: N/A;   COLONOSCOPY WITH PROPOFOL N/A 12/22/2016   Procedure: COLONOSCOPY WITH PROPOFOL;  Surgeon: Lin Landsman, MD;  Location: [place];  Service: Endoscopy;  Laterality: N/A;   COLONOSCOPY WITH  PROPOFOL N/A 09/14/2017   Procedure: COLONOSCOPY WITH PROPOFOL;  Surgeon: Lin Landsman, MD;  Location[facility] ENDOSCOPY;  Service: Gastroenterology;  Laterality: N/A;   ESOPHAGOGASTRODUODENOSCOPY (EGD) WITH PROPOFOL N/A 12/22/2016   Procedure: ESOPHAGOGASTRODUODENOSCOPY (EGD) WITH PROPOFOL;  Surgeon: Lin Landsman, MD;  Location: [place];  Service: Endoscopy;  Laterality: N/A;   rectal fistula surgery     RECTAL SURGERY  2021    drainage tube placed in rectum due to recurrent infections    WRIST SURGERY      Prior to Admission medications   Medication Sig Start Date End Date Taking? Authorizing Provider  ondansetron (ZOFRAN ODT) 4 MG disintegrating tablet Take 1 tablet (4 mg total) by mouth every 8 (eight) hours as needed. 12/25/20  Yes Alfred Levins, [place], MD  predniSONE (DELTASONE) 20 MG tablet Take 69m a day for 3 days, then 476mfor 3 days, then 2046mor 3 days. 12/25/20  Yes Lataya Varnell, [place]  Adapalene-Benzoyl Peroxide (EPIDUO FORTE) 0.3-2.5 % GEL Apply 1 application topically at bedtime. Qhs to back, shoulders, chest for acne 10/09/20   KowRalene BatheD  ARIPiprazole (ABILIFY) 5 MG tablet Take 5 mg by mouth daily. 12/15/18   [provider]  ciprofloxacin (CIPRO) 500 MG tablet Take 500 mg by mouth 2 (two) times daily. Patient not taking: Reported on 10/09/2020 10/22/19   [provider]  divalproex (DEPAKOTE ER) 500 MG 24 hr tablet Take 1,000 mg by mouth at bedtime. 08/03/19   [provider]  escitalopram (LEXAPRO) 10 MG tablet Take 1 tablet (10 mg total) by mouth daily. 02/20/18   EapUrsula AlertD  hydrOXYzine (VISTARIL) 25 MG capsule Take 1 capsule (25 mg total) by mouth daily as needed (severe anxiety attacks). 07/15/17   EapUrsula AlertD  loperamide (IMODIUM) 2 MG capsule Take by mouth 4 (four) times daily as needed. 03/20/19   [provider]  metroNIDAZOLE (FLAGYL) 500 MG tablet Take 500 mg by mouth 3 (three) times daily. 10/22/19   [provider]  STELARA 90 MG/ML SOSY injection Inject 90 mg into the skin every 8 (eight) weeks.  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 Eyes: Negative for visual changes. ENT: Negative for sore throat. Neck: No neck pain  Cardiovascular: Negative for chest pain. Respiratory: Negative for shortness of breath. Gastrointestinal: + L sided abdominal pain, nausea, and diarrhea. Genitourinary: Negative for dysuria. Musculoskeletal: Negative for back pain. Skin: Negative for rash. Neurological: Negative for headaches, weakness or numbness. Psych: No SI or HI  ____________________________________________   PHYSICAL EXAM:  VITAL SIGNS: ED Triage Vitals  Enc Vitals Group     BP 12/24/20 1712 119/79     Pulse Rate 12/24/20 1712 87     Resp 12/24/20 1712 18  Temp 12/24/20 1712 98 F (36.7 C)     Temp src --      SpO2 12/24/20 1712 100 %     Weight --      Height --      Head Circumference --      Peak Flow --      Pain Score 12/24/20 1712 6     Pain Loc --      Pain Edu? --      Excl. in [place]? --     Constitutional: Alert and oriented. Well appearing and in no apparent distress. HEENT:      Head: Normocephalic and atraumatic.         Eyes: Conjunctivae are normal. Sclera is non-icteric.       Mouth/Throat: Mucous membranes are moist.       Neck: Supple with no signs of meningismus. Cardiovascular: Regular rate and rhythm. No murmurs, gallops, or rubs. 2+ symmetrical distal pulses are present in all extremities. No JVD. Respiratory: Normal respiratory effort. Lungs are clear to auscultation bilaterally.  Gastrointestinal: Soft, non tender, and non distended with positive bowel sounds. No rebound or guarding. Genitourinary: No CVA tenderness. Musculoskeletal:  No edema, cyanosis, or erythema of extremities. Neurologic: Normal  speech and language. Face is symmetric. Moving all extremities. No gross focal neurologic deficits are appreciated. Skin: Skin is warm, dry and intact. No rash noted. Psychiatric: Mood and affect are normal. Speech and behavior are normal.  ____________________________________________   LABS (all labs ordered are listed, but only abnormal results are displayed)  Labs Reviewed  COMPREHENSIVE METABOLIC PANEL - Abnormal; Notable for the following components:      Result Value   Calcium 8.4 (*)    All other components within normal limits  CBC - Abnormal; Notable for the following components:   WBC 13.4 (*)    All other components within normal limits  URINALYSIS, ROUTINE W REFLEX MICROSCOPIC - Abnormal; Notable for the following components:   Color, Urine YELLOW (*)    APPearance HAZY (*)    Ketones, ur 20 (*)    All other components within normal limits  CHLAMYDIA/NGC RT PCR ([facility] ONLY)            LIPASE, BLOOD   ____________________________________________  EKG  none  ____________________________________________  RADIOLOGY  I have personally reviewed the images performed during this visit and I agree with the Radiologist's read.   Interpretation by Radiologist:  CT ABDOMEN PELVIS W CONTRAST  Result Date: 12/25/2020 CLINICAL DATA:  Left-sided abdominal pain for 1 week, history of Crohn's disease, initial encounter EXAM: CT ABDOMEN AND PELVIS WITH CONTRAST TECHNIQUE: Multidetector CT imaging of the abdomen and pelvis was performed using the standard protocol following bolus administration of intravenous contrast. CONTRAST:  127m OMNIPAQUE IOHEXOL 300 MG/ML  SOLN COMPARISON:  10/23/2019 FINDINGS: Lower chest: No acute abnormality. Hepatobiliary: Liver is within normal limits. Gallbladder is decompressed. A single dependent gallstone is noted without complicating factors. Pancreas: Unremarkable. No pancreatic ductal dilatation or surrounding inflammatory changes. Spleen: Normal  in size without focal abnormality. Adrenals/Urinary Tract: Adrenal glands are within normal limits. Kidneys demonstrate a normal enhancement pattern bilaterally. No renal calculi or obstructive changes are seen. Small right renal cyst is noted stable from the prior exam. The bladder is decompressed. Stomach/Bowel: Curvilinear density is noted adjacent to the anus and rectum likely related to prior drainage of a known rectal fistula. No persistent or recurrent abscess is noted in this region. The colon is well visualized  with scattered fecal material throughout. No obstructive changes are noted. Postsurgical changes in the mid transverse colon are seen consistent with prior hemicolectomy on the right. Small bowel and stomach are within normal limits. The appendix has been surgically removed. Vascular/Lymphatic: No significant vascular findings are present. No enlarged abdominal or pelvic lymph nodes. Reproductive: Prostate is unremarkable. Other: No abdominal wall hernia or abnormality. No abdominopelvic ascites. Musculoskeletal: No acute or significant osseous findings. IMPRESSION: Postsurgical changes consistent with the given clinical history of right hemicolectomy. Curvilinear density in the region of the distal rectum and buttocks likely draining a known rectal fistula. No persistent abscess is seen. Cholelithiasis without complicating factors. Electronically Signed   By: Inez Catalina M.D.   On: 12/25/2020 04:06     ____________________________________________   PROCEDURES  Procedure(s) performed: None Procedures   Critical Care performed:  None ____________________________________________   INITIAL IMPRESSION / ASSESSMENT AND PLAN / ED COURSE   35 y.o. male with a history of Crohn's disease who presents for evaluation of abdominal pain.  Patient reports a week of left-sided abdominal pain and nausea.  Has had diarrhea but that seems to be normal for him.  Currently not undergoing any treatment  for his Crohn's disease.  Patient reports that symptoms feel like Crohn's flare.  Patient is otherwise well-appearing in no distress with normal vital signs.  Abdomen soft and nondistended with no significant tenderness to palpation.  Labs reviewed by me showing mildly elevated white count of 13.4.  Normal platelets, normal chemistry panel, normal LFTs and lipase.  UA with no signs of urinary tract infection or blood.  Due to history of colon resections and Crohn's disease we will pursue a CT abdomen pelvis for further evaluation.  We will treat patient's symptoms with IV Toradol, IV fentanyl, and IV Zofran.  Differential diagnosis including Crohn's flare versus gastroenteritis versus colitis versus diverticulitis versus pancreatitis versus SBO.  Old medical records reviewed including colonoscopy from June 2021 and notes from patient's primary care doctor.  _________________________ 4:27 AM on 12/25/2020 ----------------------------------------- CT showing no acute pathology to justify patient's symptoms.  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  Possibly a viral syndrome since patient has been having diarrhea with it.  He is also concerned about possible STDs.  STD testing has been sent.  Patient would like to await the results of the testing before he gets any treatment.  Since he does feel that his symptoms are compatible with a Crohn's flare, we discussed risks and benefits of a short steroid course the patient would like to try at this time.  Will provide with a prescription.  We will also give him a prescription for Zofran.  Recommended close follow-up with his primary care doctor      _____________________________________________ Please note:  Patient was evaluated in Emergency Department today for the symptoms described in the history of present illness. Patient was evaluated in the context of the global COVID-19 pandemic, which necessitated consideration that the patient  might be at risk for infection with the SARS-CoV-2 virus that causes COVID-19. Institutional protocols and algorithms that pertain to the evaluation of patients at risk for COVID-19 are in a state of rapid change based on information released by regulatory bodies including the CDC and federal and state organizations. These policies and algorithms were followed during the patient's care in the ED.  Some ED evaluations and interventions may be delayed as a result of limited staffing during the pandemic.   [place] Controlled  Substance Database was reviewed by me. ____________________________________________   FINAL CLINICAL IMPRESSION(S) / ED DIAGNOSES   Final diagnoses:  Abdominal pain, unspecified abdominal location  Exposure to STD      NEW MEDICATIONS STARTED DURING THIS VISIT:  ED Discharge Orders          Ordered    predniSONE (DELTASONE) 20 MG tablet        12/25/20 0430    ondansetron (ZOFRAN ODT) 4 MG disintegrating tablet  Every 8 hours PRN        12/25/20 0430             Note:  This document was prepared using Dragon voice recognition software and may include unintentional dictation errors.    Rudene Re, MD 12/25/20 [phone]

## 2020-12-30 ENCOUNTER — Emergency Department
Admission: EM | Admit: 2020-12-30 | Discharge: 2020-12-30 | Disposition: A | Discharge status: Home / Self Care | Payer: BC Managed Care – PPO | Attending: Emergency Medicine | Admitting: Emergency Medicine

## 2020-12-30 ENCOUNTER — Emergency Department: Payer: BC Managed Care – PPO

## 2020-12-30 ENCOUNTER — Other Ambulatory Visit: Payer: Self-pay

## 2020-12-30 DIAGNOSIS — Z85828 Personal history of other malignant neoplasm of skin: Secondary | ICD-10-CM | POA: Diagnosis not present

## 2020-12-30 DIAGNOSIS — R109 Unspecified abdominal pain: Secondary | ICD-10-CM | POA: Insufficient documentation

## 2020-12-30 DIAGNOSIS — J189 Pneumonia, unspecified organism: Secondary | ICD-10-CM

## 2020-12-30 DIAGNOSIS — J181 Lobar pneumonia, unspecified organism: Secondary | ICD-10-CM | POA: Insufficient documentation

## 2020-12-30 DIAGNOSIS — Z87891 Personal history of nicotine dependence: Secondary | ICD-10-CM | POA: Insufficient documentation

## 2020-12-30 DIAGNOSIS — R509 Fever, unspecified: Secondary | ICD-10-CM | POA: Diagnosis present

## 2020-12-30 DIAGNOSIS — Z91010 Allergy to peanuts: Secondary | ICD-10-CM | POA: Insufficient documentation

## 2020-12-30 DIAGNOSIS — J4520 Mild intermittent asthma, uncomplicated: Secondary | ICD-10-CM | POA: Diagnosis not present

## 2020-12-30 LAB — URINALYSIS, ROUTINE W REFLEX MICROSCOPIC
Bilirubin Urine: NEGATIVE
Glucose, UA: NEGATIVE mg/dL
Hgb urine dipstick: NEGATIVE
Ketones, ur: NEGATIVE mg/dL
Leukocytes,Ua: NEGATIVE
Nitrite: NEGATIVE
Protein, ur: NEGATIVE mg/dL
Specific Gravity, Urine: 1.026 (ref 1.005–1.030)
pH: 5 (ref 5.0–8.0)

## 2020-12-30 LAB — COMPREHENSIVE METABOLIC PANEL
ALT: 23 U/L (ref 0–44)
AST: 20 U/L (ref 15–41)
Albumin: 4.2 g/dL (ref 3.5–5.0)
Alkaline Phosphatase: 81 U/L (ref 38–126)
Anion gap: 7 (ref 5–15)
BUN: 8 mg/dL (ref 6–20)
CO2: 27 mmol/L (ref 22–32)
Calcium: 8.8 mg/dL — ABNORMAL LOW (ref 8.9–10.3)
Chloride: 103 mmol/L (ref 98–111)
Creatinine, Ser: 0.87 mg/dL (ref 0.61–1.24)
GFR, Estimated: >60 mL/min (ref >60)
Glucose, Bld: 78 mg/dL (ref 70–99)
Potassium: 3.6 mmol/L (ref 3.5–5.1)
Sodium: 137 mmol/L (ref 135–145)
Total Bilirubin: 0.7 mg/dL (ref 0.3–1.2)
Total Protein: 8.1 g/dL (ref 6.5–8.1)

## 2020-12-30 LAB — CBC WITH DIFFERENTIAL/PLATELET
Abs Immature Granulocytes: 0.06 10*3/uL (ref 0.00–0.07)
Basophils Absolute: 0.1 10*3/uL (ref 0.0–0.1)
Basophils Relative: 1 %
Eosinophils Absolute: 0.5 10*3/uL (ref 0.0–0.5)
Eosinophils Relative: 4 %
HCT: 47.8 % (ref 39.0–52.0)
Hemoglobin: 15.8 g/dL (ref 13.0–17.0)
Immature Granulocytes: 1 %
Lymphocytes Relative: 11 %
Lymphs Abs: 1.5 10*3/uL (ref 0.7–4.0)
MCH: 27 pg (ref 26.0–34.0)
MCHC: 33.1 g/dL (ref 30.0–36.0)
MCV: 81.7 fL (ref 80.0–100.0)
Monocytes Absolute: 0.9 10*3/uL (ref 0.1–1.0)
Monocytes Relative: 7 %
Neutro Abs: 10.1 10*3/uL — ABNORMAL HIGH (ref 1.7–7.7)
Neutrophils Relative %: 76 %
Platelets: 349 10*3/uL (ref 150–400)
RBC: 5.85 MIL/uL — ABNORMAL HIGH (ref 4.22–5.81)
RDW: 11.9 % (ref 11.5–15.5)
WBC: 13.1 10*3/uL — ABNORMAL HIGH (ref 4.0–10.5)
nRBC: 0 % (ref 0.0–0.2)

## 2020-12-30 MED ORDER — AZITHROMYCIN 250 MG PO TABS: ORAL_TABLET | ORAL | 0 refills | Status: DC

## 2020-12-30 MED ORDER — CEPHALEXIN 500 MG PO CAPS: 500.0000 mg | ORAL_CAPSULE | Freq: Three times a day (TID) | ORAL | 0 refills | Status: DC

## 2020-12-30 MED ORDER — ONDANSETRON 4 MG PO TBDP
4.0000 mg | ORAL_TABLET | Freq: Once | ORAL | Status: AC
  Administered 2020-12-30: 4 mg via ORAL
  Filled 2020-12-30: qty 1

## 2020-12-30 NOTE — ED Provider Notes (Signed)
[facility] Emergency Department Provider Note  ____________________________________________   Event Date/Time   First MD Initiated Contact with Patient 12/30/20 1249     (approximate)  I have reviewed the triage vital signs and the nursing notes.   HISTORY  Chief Complaint Side Pain   HPI Leroy Hernandez is a 35 y.o. male presents to the ED with complaint of left-sided flank pain that he describes as a sharp stabbing pain at times.  Patient states this been going on for approximately 1 week.  He was seen in the emergency department on 12/25/2020 at which time he had a complete work-up due to his history of Crohn's disease.  Patient also was concerned about possible STDs.  Both STD testing and CT abdomen did not show any new findings and STD D testing was negative.  Patient was treated with a course of Toradol IV, fentanyl and Zofran.  Patient was discharged with prescription for short course of steroids and Zofran.  He was to follow-up with his primary care provider.  Today he rates his pain as 7 out of 10.          Past Medical History:  Diagnosis Date   Anxiety    Asthma    as a child   Blood in stool    Calculus of gallbladder without cholecystitis without obstruction 12/25/2015   Crohn's disease ([place])    Depression    Exacerbation of Crohn's disease ([place]) 09/10/2017   Gallbladder polyp 12/25/2015   Gastroenteritis due to norovirus 05/03/2017   Generalized abdominal pain    H/O Clostridium difficile infection 05/03/2017   Iron deficiency anemia 12/28/2016   Iron deficiency anemia 12/28/2016   Perirectal abscess 07/04/2015   Overview:  Added automatically from request for surgery [id number]   Rectal fistula    Sepsis ([place]) 06/22/2016   Squamous cell carcinoma of skin 07/19/2018   L temple    Patient Active Problem List   Diagnosis Date Noted   Crohn's colitis, unspecified complication ([place]) 03/00/9233   Crohn's disease of anal canal (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   Asthma, mild intermittent 04/02/2015    Past Surgical History:  Procedure Laterality Date   COLON RESECTION     COLON SURGERY     COLONOSCOPY N/A 11/30/2018   Procedure: COLONOSCOPY;  Surgeon: Lin Landsman, MD;  Location: [facility] ENDOSCOPY;  Service: Gastroenterology;  Laterality: N/A;   COLONOSCOPY WITH PROPOFOL N/A 04/03/2015   Procedure: COLONOSCOPY WITH PROPOFOL;  Surgeon: Hulen Luster, MD;  Location: [facility] ENDOSCOPY;  Service: Endoscopy;  Laterality: N/A;   COLONOSCOPY WITH PROPOFOL N/A 12/22/2015   Procedure: COLONOSCOPY WITH PROPOFOL;  Surgeon: [name], MD;  Location: [place];  Service: Endoscopy;  Laterality: N/A;   COLONOSCOPY WITH PROPOFOL N/A 12/22/2016   Procedure: COLONOSCOPY WITH PROPOFOL;  Surgeon: Lin Landsman, MD;  Location: [place];  Service: Endoscopy;  Laterality: N/A;   COLONOSCOPY WITH PROPOFOL N/A 09/14/2017   Procedure: COLONOSCOPY WITH PROPOFOL;  Surgeon: Lin Landsman, MD;  Location: [facility] ENDOSCOPY;  Service: Gastroenterology;  Laterality: N/A;   ESOPHAGOGASTRODUODENOSCOPY (EGD) WITH PROPOFOL N/A  12/22/2016   Procedure: ESOPHAGOGASTRODUODENOSCOPY (EGD) WITH PROPOFOL;  Surgeon: Lin Landsman, MD;  Location: [place];  Service: Endoscopy;  Laterality: N/A;   rectal fistula surgery     RECTAL SURGERY  2021   drainage tube placed in rectum due to recurrent infections    WRIST SURGERY      Prior to Admission medications   Medication Sig Start Date End Date Taking? Authorizing Provider  azithromycin (ZITHROMAX Z-PAK)  250 MG tablet Take 2 tablets (500 mg) on  Day 1,  followed by 1 tablet (250 mg) once daily on Days 2 through 5. 12/30/20  Yes Letitia Neri L, PA-C  cephALEXin (KEFLEX) 500 MG capsule Take 1 capsule (500 mg total) by mouth 3 (three) times daily. 12/30/20  Yes Johnn Hai, PA-C  Adapalene-Benzoyl Peroxide (EPIDUO FORTE) 0.3-2.5 % GEL Apply 1 application topically at bedtime. Qhs to back, shoulders, chest for acne 10/09/20   Ralene Bathe, MD  ARIPiprazole (ABILIFY) 5 MG tablet Take 5 mg by mouth daily. 12/15/18   [provider]  ciprofloxacin (CIPRO) 500 MG tablet Take 500 mg by mouth 2 (two) times daily. Patient not taking: Reported on 10/09/2020 10/22/19   [provider]  divalproex (DEPAKOTE ER) 500 MG 24 hr tablet Take 1,000 mg by mouth at bedtime. 08/03/19   [provider]  escitalopram (LEXAPRO) 10 MG tablet Take 1 tablet (10 mg total) by mouth daily. 02/20/18   Ursula Alert, MD  hydrOXYzine (VISTARIL) 25 MG capsule Take 1 capsule (25 mg total) by mouth daily as needed (severe anxiety attacks). 07/15/17   Ursula Alert, MD  loperamide (IMODIUM) 2 MG capsule Take by mouth 4 (four) times daily as needed. 03/20/19   [provider]  metroNIDAZOLE (FLAGYL) 500 MG tablet Take 500 mg by mouth 3 (three) times daily. 10/22/19   [provider]  ondansetron (ZOFRAN ODT) 4 MG disintegrating tablet Take 1 tablet (4 mg total) by mouth every 8 (eight) hours as needed. 12/25/20   Rudene Re, MD  predniSONE (DELTASONE) 20 MG tablet Take 25m a day for 3 days, then 438mfor 3 days, then 2057mor 3 days. 12/25/20   VerRudene ReD  STELARA 90 MG/ML SOSY injection Inject 90 mg into the skin every 8 (eight) weeks.  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 ENT: No sore throat. Cardiovascular: Denies chest pain. Respiratory: Denies shortness of breath. Gastrointestinal: No abdominal pain.  No nausea, no vomiting.  No diarrhea.  Positive right flank pain. Genitourinary: Negative for dysuria. Musculoskeletal: Negative for back pain. Skin: Negative for rash. Neurological: Negative for headaches, focal weakness or numbness.  ____________________________________________   PHYSICAL EXAM:  VITAL SIGNS: ED Triage Vitals  Enc Vitals Group     BP 12/30/20  1225 128/87     Pulse Rate 12/30/20 1225 (!) 101     Resp 12/30/20 1225 17     Temp 12/30/20 1225 97.9 F (36.6 C)     Temp Source 12/30/20 1225 Oral     SpO2 12/30/20 1225 100 %     Weight 12/30/20 1226 134 lb 6.4 oz (61 kg)     Height 12/30/20 1226 5' 5"  (1.651 m)     Head Circumference --      Peak Flow --      Pain Score 12/30/20 1226 7     Pain Loc --      Pain Edu? --      Excl. in [place]? --     Constitutional: Alert and oriented. Well appearing and in no acute distress. Eyes: Conjunctivae are normal.  Head: Atraumatic. Nose: No congestion/rhinnorhea. Mouth/Throat: Mucous membranes are moist.  Oropharynx non-erythematous. Neck: No stridor.   Cardiovascular: Normal rate, regular rhythm. Grossly normal heart sounds.  Good peripheral circulation. Respiratory: Normal respiratory effort.  No retractions. Lungs CTAB. Gastrointestinal: Soft and nontender. No distention.  Mild left CVA tenderness.  Bowel sounds normoactive x4 quadrants. Musculoskeletal: Moves upper and lower extremities  without any difficulty.  Normal gait was noted.  No edema noted lower extremities. Neurologic:  Normal speech and language. No gross focal neurologic deficits are appreciated. No gait instability. Skin:  Skin is warm, dry and intact. No rash noted. Psychiatric: Mood and affect are normal. Speech and behavior are normal.  ____________________________________________   LABS (all labs ordered are listed, but only abnormal results are displayed)  Labs Reviewed  URINALYSIS, ROUTINE W REFLEX MICROSCOPIC - Abnormal; Notable for the following components:      Result Value   Color, Urine YELLOW (*)    APPearance HAZY (*)    All other components within normal limits  CBC WITH DIFFERENTIAL/PLATELET - Abnormal; Notable for the following components:   WBC 13.1 (*)    RBC 5.85 (*)    Neutro Abs 10.1 (*)    All other components within normal limits  COMPREHENSIVE METABOLIC PANEL - Abnormal; Notable for the following components:   Calcium 8.8 (*)    All other components within normal limits   ____________________________________________  ___________________________________________  RADIOLOGY Leana Gamer, personally viewed and evaluated these images (plain radiographs) as part of my medical decision making, as well as reviewing the written report by the radiologist.    Official radiology report(s): DG Chest 2 View  Result Date: 12/30/2020 CLINICAL DATA:  Right flank pain, left pleural effusion. EXAM: CHEST - 2 VIEW COMPARISON:  Previous studies including CT abdomen done on 12/30/2020 FINDINGS: Cardiac size is within normal limits. Lung [place] are clear of any infiltrates or pulmonary edema. There is blunting of left posterior CP angle. There is no pneumothorax. IMPRESSION: Blunting of left posterior costophrenic angle suggests small effusion. There are no signs of pulmonary edema or focal pulmonary consolidation. Electronically Signed   By: Elmer Picker M.D.   On: 12/30/2020 16:30    CT Renal Stone Study  Result Date: 12/30/2020 CLINICAL DATA:  A 35 year old male presents for evaluation of flank pain. EXAM: CT ABDOMEN AND PELVIS WITHOUT CONTRAST TECHNIQUE: Multidetector CT imaging of the abdomen and pelvis was performed following the standard protocol without IV contrast. COMPARISON:  Comparison made with December 25, 2020. FINDINGS: Lower chest: Small LEFT-sided effusion and basilar volume loss this is developed since the very recent CT comparison. Hepatobiliary: Smooth contour of the liver.  No visible lesion on noncontrast imaging. Cholelithiasis. No gross biliary duct distension. Pancreas: Smooth pancreatic contour. No signs of inflammation or adjacent fluid. Spleen: Normal size and contour. Adrenals/Urinary Tract: Adrenal glands are normal. Smooth contour the bilateral kidneys. No perinephric stranding. No ureteral calculi or renal calculi. Stomach/Bowel: No stranding adjacent to the stomach. Stomach is under distended. Mild mural stratification of the descending duodenum without adjacent stranding and with submucosal fat deposition. No signs of small bowel dilation. Post RIGHT hemicolectomy with ileocolonic anastomosis in the central upper abdomen. Thickening of the distal small bowel/ileum just proximal to the anastomosis is suggested on today's study. Mildly a haustral appearance of the mid transverse colon at the level of the anastomosis. Mural stratification of the rectum. Mild perirectal stranding. Perianal stranding extending into RIGHT gluteal fold where there is a tract that extends into the gluteal fold that is similar to previous imaging. Setons are in place about the lower margin of the anterior sphincter complex. Vascular/Lymphatic: Aorta with smooth contours. IVC with smooth contours. No aneurysmal dilation of the abdominal aorta. There is no gastrohepatic or hepatoduodenal ligament lymphadenopathy. No retroperitoneal or mesenteric lymphadenopathy. No pelvic sidewall  lymphadenopathy. Limited assessment due to lack of intravenous contrast. Reproductive: Unremarkable by CT. Other: No ascites, abscess or free air. Musculoskeletal: No acute bone finding. No destructive bone process. Spinal degenerative changes. IMPRESSION: Signs of perianal fistula as on recent imaging. Setons are in place about the lower margin of the anterior sphincter complex. Stranding extends into the RIGHT gluteal fold likely representing a tract that does not contain a Seton. This is quite similar to the previous exam Question enterocolitis. Correlate with any signs to suggest infectious enterocolitis such as C diff colitis in this patient with Crohn's disease. Differential of course would include changes of inflammatory bowel disease. No obstruction or abscess. Cholelithiasis. Small LEFT-sided effusion and basilar volume loss which is new when compared to the recent comparison evaluation. Electronically Signed   By: Zetta Bills M.D.   On: 12/30/2020 15:14    ____________________________________________   PROCEDURES  Procedure(s) performed (including Critical Care):  Procedures   ____________________________________________   INITIAL IMPRESSION / ASSESSMENT AND PLAN / ED COURSE  As part of my medical decision making, I reviewed the following data within the electronic MEDICAL RECORD NUMBER Notes from prior ED visits and [place] Controlled Substance Database  35 year old male presents to the ED with complaint of left flank area pain for approximately 1 week.  Patient was seen on 12/25/2020 at which time it was felt that he was having a flareup of his Crohn's and treated with Zofran and prednisone.  Patient denies any new GI symptoms but reports that he has left flank pain that occasionally is sharp.  He is unaware of any fever.  No history of injury.  Lab work showed WBC mildly elevated at 13.1.  A CT scan renal study showed a small effusion left basilar area suggestive of a left lower lobe  pneumonia.  Chest x-ray was still questionable along with patient's symptoms and elevated white count Dr. Rip Harbour and I agreed that he should be on antibiotics for community acquired pneumonia.  Patient states he has Zofran at home.  A prescription for Zithromax and Keflex was sent to his pharmacy.  Patient is to follow-up with his PCP.  He is aware that he should return to the emergency department if any worsening of his breathing or difficulties especially over the holiday weekend.   ____________________________________________   FINAL CLINICAL IMPRESSION(S) /  ED DIAGNOSES  Final diagnoses:  Community acquired pneumonia of left lower lobe of lung     ED Discharge Orders          Ordered    azithromycin (ZITHROMAX Z-PAK) 250 MG tablet        12/30/20 1636    cephALEXin (KEFLEX) 500 MG capsule  3 times daily        12/30/20 1636             Note:  This document was prepared using Dragon voice recognition software and may include unintentional dictation errors.    Johnn Hai, PA-C 12/31/20 [id number]    Nena Polio, MD 01/02/21 [phone]

## 2020-12-30 NOTE — ED Triage Notes (Addendum)
Pt to ER via POV with complaints of right sided flank pain, reports sharp stabbing pain. Reports pain has been ongoing for approx 1 week. Denies urinary symptoms.

## 2020-12-30 NOTE — Discharge Instructions (Addendum)
Call make an appoint with Dr. Ouida Sills to follow-up in 7 to 10 days.  Begin taking antibiotics as directed.  There are 2 antibiotics to cover different germs.  You may take Tylenol if needed for pain.  Drink lots of fluids to stay hydrated.  If any severe worsening of your symptoms such as shortness of breath or difficulty breathing return to the emergency department immediately over the holiday weekend.

## 2021-02-21 ENCOUNTER — Emergency Department
Admission: EM | Admit: 2021-02-21 | Discharge: 2021-02-21 | Disposition: A | Discharge status: Home / Self Care | Payer: BC Managed Care – PPO | Attending: Emergency Medicine | Admitting: Emergency Medicine

## 2021-02-21 ENCOUNTER — Encounter: Payer: Self-pay | Admitting: Intensive Care

## 2021-02-21 ENCOUNTER — Other Ambulatory Visit: Payer: Self-pay

## 2021-02-21 ENCOUNTER — Emergency Department: Payer: BC Managed Care – PPO

## 2021-02-21 DIAGNOSIS — L0231 Cutaneous abscess of buttock: Secondary | ICD-10-CM | POA: Diagnosis not present

## 2021-02-21 DIAGNOSIS — K50113 Crohn's disease of large intestine with fistula: Secondary | ICD-10-CM | POA: Diagnosis not present

## 2021-02-21 DIAGNOSIS — L089 Local infection of the skin and subcutaneous tissue, unspecified: Secondary | ICD-10-CM | POA: Diagnosis present

## 2021-02-21 LAB — COMPREHENSIVE METABOLIC PANEL
ALT: 13 U/L (ref 0–44)
AST: 14 U/L — ABNORMAL LOW (ref 15–41)
Albumin: 3.6 g/dL (ref 3.5–5.0)
Alkaline Phosphatase: 91 U/L (ref 38–126)
Anion gap: 9 (ref 5–15)
BUN: 6 mg/dL (ref 6–20)
CO2: 27 mmol/L (ref 22–32)
Calcium: 8.7 mg/dL — ABNORMAL LOW (ref 8.9–10.3)
Chloride: 100 mmol/L (ref 98–111)
Creatinine, Ser: 0.86 mg/dL (ref 0.61–1.24)
GFR, Estimated: >60 mL/min (ref >60)
Glucose, Bld: 97 mg/dL (ref 70–99)
Potassium: 3.8 mmol/L (ref 3.5–5.1)
Sodium: 136 mmol/L (ref 135–145)
Total Bilirubin: 0.8 mg/dL (ref 0.3–1.2)
Total Protein: 7.7 g/dL (ref 6.5–8.1)

## 2021-02-21 LAB — CBC WITH DIFFERENTIAL/PLATELET
Abs Immature Granulocytes: 0.04 10*3/uL (ref 0.00–0.07)
Basophils Absolute: 0 10*3/uL (ref 0.0–0.1)
Basophils Relative: 0 %
Eosinophils Absolute: 0.5 10*3/uL (ref 0.0–0.5)
Eosinophils Relative: 5 %
HCT: 47 % (ref 39.0–52.0)
Hemoglobin: 15.1 g/dL (ref 13.0–17.0)
Immature Granulocytes: 0 %
Lymphocytes Relative: 16 %
Lymphs Abs: 1.6 10*3/uL (ref 0.7–4.0)
MCH: 25.8 pg — ABNORMAL LOW (ref 26.0–34.0)
MCHC: 32.1 g/dL (ref 30.0–36.0)
MCV: 80.3 fL (ref 80.0–100.0)
Monocytes Absolute: 0.7 10*3/uL (ref 0.1–1.0)
Monocytes Relative: 8 %
Neutro Abs: 6.9 10*3/uL (ref 1.7–7.7)
Neutrophils Relative %: 71 %
Platelets: 330 10*3/uL (ref 150–400)
RBC: 5.85 MIL/uL — ABNORMAL HIGH (ref 4.22–5.81)
RDW: 12.5 % (ref 11.5–15.5)
WBC: 9.8 10*3/uL (ref 4.0–10.5)
nRBC: 0 % (ref 0.0–0.2)

## 2021-02-21 LAB — LACTIC ACID, PLASMA: Lactic Acid, Venous: 1.5 mmol/L (ref 0.5–1.9)

## 2021-02-21 MED ORDER — CIPROFLOXACIN HCL 500 MG PO TABS
500.0000 mg | ORAL_TABLET | Freq: Once | ORAL | Status: AC
  Administered 2021-02-21: 500 mg via ORAL
  Filled 2021-02-21: qty 1

## 2021-02-21 MED ORDER — IOHEXOL 300 MG/ML  SOLN
100.0000 mL | Freq: Once | INTRAMUSCULAR | Status: AC | PRN
  Administered 2021-02-21: 100 mL via INTRAVENOUS

## 2021-02-21 MED ORDER — METRONIDAZOLE 500 MG PO TABS
500.0000 mg | ORAL_TABLET | Freq: Four times a day (QID) | ORAL | 0 refills | Status: AC
Start: 2021-02-21 — End: 2021-02-28

## 2021-02-21 MED ORDER — CIPROFLOXACIN HCL 500 MG PO TABS
500.0000 mg | ORAL_TABLET | Freq: Two times a day (BID) | ORAL | 0 refills | Status: AC
Start: 2021-02-21 — End: 2021-02-28

## 2021-02-21 MED ORDER — METRONIDAZOLE 500 MG PO TABS
500.0000 mg | ORAL_TABLET | Freq: Once | ORAL | Status: AC
Start: 2021-02-21 — End: 2021-02-21
  Administered 2021-02-21: 500 mg via ORAL
  Filled 2021-02-21: qty 1

## 2021-02-21 NOTE — Consult Note (Signed)
Date of Consultation:  02/21/2021  Requesting Physician:  Arta Silence, MD  Reason for Consultation:  Perianal fistula with abscess  History of Present Illness: Leroy Hernandez is a 36 y.o. male with history of Crohn's disease s/p two prior abdominal surgeries/resections.  He also has history of recurrent perianal abscess with fistula, and had a transsphincteric seton placed in October 2021 in [place], [place].  The seton has remained in place.  He also reports that he's had a difficult time reaching the surgeon to see if the seton needs to remain in place or not.  He also has tried to reach GI in for medical management of his Crohn's disease, but apparently also has had a difficult time to reach them.  He's currently not on any medications for Crohn's.  The patient presents today with main complaint of drainage and pain in the perianal area.  Reports that yesterday he started having increased tenderness in the perianal area surrounding the seton, and he went to [facility] walk-in clinic and got a prescription for Doxycycline.  Then overnight he had significant bloody purulent drainage.  He was not able to pick up his prescription today so he has not been on any antibiotic yet.  Reports that the pain/pressure is better now that it is draining.  Denies any fevers yesterday or today, abdominal pain, blood in his stool.  He reports having diarrhea which is chronic for him.  Reports recent bout of COVID and finished isolation a couple days ago.  Past Medical History: Past Medical History:  Diagnosis Date   Anxiety    Asthma    as a child   Blood in stool    Calculus of gallbladder without cholecystitis without obstruction 12/25/2015   Crohn's disease ([place])    Depression    Exacerbation of Crohn's disease ([place]) 09/10/2017   Gallbladder polyp 12/25/2015   Gastroenteritis due to norovirus 05/03/2017   Generalized abdominal pain    H/O Clostridium difficile infection 05/03/2017   Iron deficiency anemia  12/28/2016   Iron deficiency anemia 12/28/2016   Perirectal abscess 07/04/2015   Overview:  Added automatically from request for surgery [id number]   Rectal fistula    Sepsis ([place]) 06/22/2016   Squamous cell carcinoma of skin 07/19/2018   L temple     Past Surgical History: Past Surgical History:  Procedure Laterality Date   COLON RESECTION     COLON SURGERY     COLONOSCOPY N/A 11/30/2018   Procedure: COLONOSCOPY;  Surgeon: Lin Landsman, MD;  Location: [facility] ENDOSCOPY;  Service: Gastroenterology;  Laterality: N/A;   COLONOSCOPY WITH PROPOFOL N/A 04/03/2015   Procedure: COLONOSCOPY WITH PROPOFOL;  Surgeon: Hulen Luster, MD;  Location: [facility] ENDOSCOPY;  Service: Endoscopy;  Laterality: N/A;   COLONOSCOPY WITH PROPOFOL N/A 12/22/2015   Procedure: COLONOSCOPY WITH PROPOFOL;  Surgeon: [name], MD;  Location: [place];  Service: Endoscopy;  Laterality: N/A;   COLONOSCOPY WITH PROPOFOL N/A 12/22/2016   Procedure: COLONOSCOPY WITH PROPOFOL;  Surgeon: Lin Landsman, MD;  Location: [place];  Service: Endoscopy;  Laterality: N/A;   COLONOSCOPY WITH PROPOFOL N/A 09/14/2017   Procedure: COLONOSCOPY WITH PROPOFOL;  Surgeon: Lin Landsman, MD;  Location: [facility] ENDOSCOPY;  Service: Gastroenterology;  Laterality: N/A;   ESOPHAGOGASTRODUODENOSCOPY (EGD) WITH PROPOFOL N/A 12/22/2016   Procedure: ESOPHAGOGASTRODUODENOSCOPY (EGD) WITH PROPOFOL;  Surgeon: Lin Landsman, MD;  Location: [place];  Service: Endoscopy;  Laterality: N/A;   rectal fistula surgery     RECTAL  SURGERY  2021   drainage tube placed in rectum due to recurrent infections    WRIST SURGERY      Home Medications: Prior to Admission medications   Medication Sig Start Date End Date Taking? Authorizing Provider  ciprofloxacin (CIPRO) 500 MG tablet Take 1 tablet (500 mg total) by mouth 2 (two) times daily for 7 days. 02/21/21 02/28/21 Yes Vladimir Crofts, MD  metroNIDAZOLE (FLAGYL) 500 MG  tablet Take 1 tablet (500 mg total) by mouth 4 (four) times daily for 7 days. 02/21/21 02/28/21 Yes Vladimir Crofts, MD  Adapalene-Benzoyl Peroxide (EPIDUO FORTE) 0.3-2.5 % GEL Apply 1 application topically at bedtime. Qhs to back, shoulders, chest for acne Patient not taking: Reported on 02/21/2021 10/09/20   Ralene Bathe, MD  ARIPiprazole (ABILIFY) 5 MG tablet Take 5 mg by mouth daily. 12/15/18   [provider]  azithromycin (ZITHROMAX Z-PAK) 250 MG tablet Take 2 tablets (500 mg) on  Day 1,  followed by 1 tablet (250 mg) once daily on Days 2 through 5. Patient not taking: Reported on 02/21/2021 12/30/20   Johnn Hai, PA-C  cephALEXin (KEFLEX) 500 MG capsule Take 1 capsule (500 mg total) by mouth 3 (three) times daily. Patient not taking: Reported on 02/21/2021 12/30/20   Johnn Hai, PA-C  ciprofloxacin (CIPRO) 500 MG tablet Take 500 mg by mouth 2 (two) times daily. Patient not taking: Reported on 10/09/2020 10/22/19   [provider]  citalopram (CELEXA) 20 MG tablet Take 20 mg by mouth every morning. 12/23/20   [provider]  divalproex (DEPAKOTE ER) 500 MG 24 hr tablet Take 1,000 mg by mouth at bedtime. 08/03/19   [provider]  doxycycline (VIBRAMYCIN) 100 MG capsule Take 100 mg by mouth 2 (two) times daily. 02/20/21   [provider]  escitalopram (LEXAPRO) 10 MG tablet Take 1 tablet (10 mg total) by mouth daily. Patient not taking: Reported on 02/21/2021 02/20/18   Ursula Alert, MD  hydrOXYzine (VISTARIL) 25 MG capsule Take 1 capsule (25 mg total) by mouth daily as needed (severe anxiety attacks). Patient not taking: Reported on 02/21/2021 07/15/17   Ursula Alert, MD  LAGEVRIO 200 MG CAPS capsule Take 4 capsules by mouth 2 (two) times daily. 02/15/21   [provider]  loperamide (IMODIUM) 2 MG capsule Take by mouth 4 (four) times daily as needed. 03/20/19   [provider]  meloxicam (MOBIC) 7.5 MG tablet Take 7.5 mg by  mouth daily as needed. 01/29/21   [provider]  metroNIDAZOLE (FLAGYL) 500 MG tablet Take 500 mg by mouth 3 (three) times daily. Patient not taking: Reported on 02/21/2021 10/22/19   [provider]  ondansetron (ZOFRAN ODT) 4 MG disintegrating tablet Take 1 tablet (4 mg total) by mouth every 8 (eight) hours as needed. Patient not taking: Reported on 02/21/2021 12/25/20   Rudene Re, MD  predniSONE (DELTASONE) 20 MG tablet Take 73m a day for 3 days, then 433mfor 3 days, then 204mor 3 days. Patient not taking: Reported on 02/21/2021 12/25/20   VerRudene ReD  promethazine-dextromethorphan (PROMETHAZINE-DM) 6.25-15 MG/5ML syrup Take 5 mLs by mouth every 6 (six) hours as needed. 02/15/21   [provider]  STELARA 90 MG/ML SOSY injection Inject 90 mg into the skin every 8 (eight) weeks.  Patient not taking: Reported on 10/09/2020 09/19/19   [provider]    Allergies: Allergies  Allergen Reactions   Peanut Oil Itching    Social History:  reports that he has never smoked. He has quit using smokeless tobacco. He reports current alcohol use. He reports that he does not use drugs.   Family History: Family History  Problem Relation Age of Onset   Diabetes Paternal Grandfather    Heart disease Paternal Grandfather    Depression Mother    Drug abuse Mother    Osteoporosis Neg Hx     Review of Systems: Review of Systems  Constitutional:  Negative for chills and fever.  Respiratory:  Negative for shortness of breath.   Cardiovascular:  Negative for chest pain.  Gastrointestinal:  Positive for diarrhea. Negative for abdominal pain, blood in stool, constipation, nausea and vomiting.  Skin:  Negative for rash.   Physical Exam BP 112/72    Pulse 84    Temp 98 F (36.7 C) (Oral)    Resp 16    Ht 5' 5"  (1.651 m)    Wt 61.2 kg    SpO2 99%    BMI 22.47 kg/m  CONSTITUTIONAL: No acute distress, well nourished. HEENT:  Normocephalic,  atraumatic, extraocular motion intact. NECK: Trachea is midline, and there is no jugular venous distension. RESPIRATORY:  Normal respiratory effort without pathologic use of accessory muscles. CARDIOVASCULAR: Regular rhythm and rate. GI: The abdomen is soft, non-distended, non-tender.  RECTAL:  External exam reveals a seton in place in the right posterior perianal area.  There is surrounding tissue inflammation consistent with the seton itself.  There is increased tenderness lateral to the seton, with palpable deeper firmness, but no superficial fluctuance or significant erythema/induration.  When pushing harder and squeezing that area, there is bloody drainage that is coming through the seton opening/track.  No skin ulcerations or other open wounds. MUSCULOSKELETAL:  Normal muscle strength and tone in all four extremities.  No peripheral edema or cyanosis. SKIN: Skin turgor is normal. There are no pathologic skin lesions.  NEUROLOGIC:  Motor and sensation is grossly normal.  Cranial nerves are grossly intact. PSYCH:  Alert and oriented to person, place and time. Affect is normal.  Laboratory Analysis: Results for orders placed or performed during the hospital encounter of 02/21/21 (from the past 24 hour(s))  Lactic acid, plasma     Status: None   Collection Time: 02/21/21  8:36 AM  Result Value Ref Range   Lactic Acid, Venous 1.5 0.5 - 1.9 mmol/L  Comprehensive metabolic panel     Status: Abnormal   Collection Time: 02/21/21  8:36 AM  Result Value Ref Range   Sodium 136 135 - 145 mmol/L   Potassium 3.8 3.5 - 5.1 mmol/L   Chloride 100 98 - 111 mmol/L   CO2 27 22 - 32 mmol/L   Glucose, Bld 97 70 - 99 mg/dL   BUN 6 6 - 20 mg/dL   Creatinine, Ser 0.86 0.61 - 1.24 mg/dL   Calcium 8.7 (L) 8.9 - 10.3 mg/dL   Total Protein 7.7 6.5 - 8.1 g/dL   Albumin 3.6 3.5 - 5.0 g/dL   AST 14 (L) 15 - 41 U/L   ALT 13 0 - 44 U/L   Alkaline Phosphatase 91 38 - 126 U/L   Total Bilirubin 0.8 0.3 - 1.2 mg/dL    GFR, Estimated >60 >60 mL/min   Anion gap 9 5 - 15  CBC with Differential     Status: Abnormal   Collection Time: 02/21/21  8:36 AM  Result Value Ref Range   WBC 9.8 4.0 - 10.5 K/uL   RBC 5.85 (H) 4.22 -  5.81 MIL/uL   Hemoglobin 15.1 13.0 - 17.0 g/dL   HCT 47.0 39.0 - 52.0 %   MCV 80.3 80.0 - 100.0 fL   MCH 25.8 (L) 26.0 - 34.0 pg   MCHC 32.1 30.0 - 36.0 g/dL   RDW 12.5 11.5 - 15.5 %   Platelets 330 150 - 400 K/uL   nRBC 0.0 0.0 - 0.2 %   Neutrophils Relative % 71 %   Neutro Abs 6.9 1.7 - 7.7 K/uL   Lymphocytes Relative 16 %   Lymphs Abs 1.6 0.7 - 4.0 K/uL   Monocytes Relative 8 %   Monocytes Absolute 0.7 0.1 - 1.0 K/uL   Eosinophils Relative 5 %   Eosinophils Absolute 0.5 0.0 - 0.5 K/uL   Basophils Relative 0 %   Basophils Absolute 0.0 0.0 - 0.1 K/uL   Immature Granulocytes 0 %   Abs Immature Granulocytes 0.04 0.00 - 0.07 K/uL    Imaging: CT PELVIS W CONTRAST  Result Date: 02/21/2021 CLINICAL DATA:  Perianal abscess. EXAM: CT PELVIS WITH CONTRAST TECHNIQUE: Multidetector CT imaging of the pelvis was performed using the standard protocol following the bolus administration of intravenous contrast. RADIATION DOSE REDUCTION: This exam was performed according to the departmental dose-optimization program which includes automated exposure control, adjustment of the mA and/or kV according to patient size and/or use of iterative reconstruction technique. CONTRAST:  154m OMNIPAQUE IOHEXOL 300 MG/ML  SOLN COMPARISON:  12/30/2020 FINDINGS: Lower Urinary Tract: Unremarkable urinary bladder. Bowel: A seton is again seen within a right-sided transsphincteric perianal fistula. This extends inferiorly into the gluteal crease. An abscess is seen along the posterior margin of the fistula in the subcutaneous tissues adjacent to the gluteal crease. This measures 2.2 x 1.7 cm and is increased in size since previous study. Vascular/Lymphatic: No pathologically enlarged lymph nodes or other significant  abnormality. Reproductive:  No mass or other significant abnormality. Other: None. Musculoskeletal: No suspicious bone lesions identified. IMPRESSION: Right-sided transsphincteric perianal fistula again demonstrated, with seton in place. Increased size of 2.2 cm abscess in the subcutaneous tissues of the right buttock, adjacent to the gluteal crease. Electronically Signed   By: JMarlaine HindM.D.   On: 02/21/2021 14:38    Assessment and Plan: This is a 36y.o. male with a perianal abscess in setting of transsphincteric seton for Crohn's disease.  --Reviewed CT scan findings with the patient.  I have personally viewed the images.  The abscess on the CT is adjacent to the track that the seton is coursing through the subcutaneous tissue.  And given where the drainage is coming from, I think the abscess is now draining through the seton track.  There is no palpable fluctuance, so do not think he needs an I&D procedure at this point.  I think since the abscess is draining, he needs antibiotic coverage to help. --Discussed with Dr. STamala Julianwho has taken over Dr. SCherylann Banas and we agreed that Cipro/Flagyl combination may be better given that this is a Crohn's related perianal abscess.  Instructed the patient to not fill the doxycycline rx sent yesterday. --Have asked Dr. STamala Julianto also send referrals to GI here for management of his Crohns, [facility] and fistula. --Follow up as needed.   JMelvyn Neth MD [facility] Pg:  [phone]

## 2021-02-21 NOTE — ED Notes (Signed)
Patient transported to CT 

## 2021-02-21 NOTE — ED Triage Notes (Signed)
Patient presents with infection around drainage tube near buttocks. Reports he was seen at [facility] yesterday and prescribed antibiotics but did not pick up antibiotics. Reports culture was done yesterday. Today c/o heavy bleeding around site. Drainage tube is for anal fistula.

## 2021-02-21 NOTE — ED Provider Notes (Signed)
Patient received in signout from Dr. Cherylann Banas pending surgical evaluation in the setting of abscess to his right buttock, associated with Crohn's disease and pre-existing and known fistula with seton in place. Patient seen and evaluated by Dr. Hampton Abbot who is reassured that this abscess is already draining via the fistula and considering how well the patient looks clinically, would be suitable for outpatient management with oral antibiotics.  We discussed antibiotic regimen, and switching him to regimen that would cover Crohn's flare.  We will switch antibiotics to Flagyl and Cipro from his doxycycline.  I provided follow-up information for a local surgical group and GI, as patient is interested in switching providers.  Patient suitable for outpatient management.   Leroy Crofts, MD 02/21/21 1725

## 2021-02-21 NOTE — Discharge Instructions (Signed)
Please switch your antibiotic to the ones that we have prescribed.  Discontinue the doxycycline  Start taking ciprofloxacin twice daily for the next 7 days. Flagyl 4 times daily for the next 7 days.  I have attached the phone number for local GI group to establish with them.  2 different phone numbers for surgical groups.  1 for the group ascites today, who often does not do Crohn's disease management but can see you possibly sooner if the other group is unavailable.  Reach out to [facility] surgical group to establish with them.  Please finish both antibiotic prescriptions.  Return to the ED with any worsening symptoms despite these measures.

## 2021-02-21 NOTE — ED Provider Notes (Signed)
[facility] Provider Note    Event Date/Time   First MD Initiated Contact with Patient 02/21/21 1303     (approximate)   History   Wound Infection   HPI  Leroy Hernandez is a 36 y.o. male with history of Crohn's disease who presents with concern for bleeding and drainage from a seton placed in his right buttock for his Crohn's disease.  He states that it was placed for a fistula in October 2021 and has stayed in since that time.  The patient follows with a surgeon in [facility] but states that he has had difficulty arranging for follow-up.  He reported some increased purulent drainage over the last few days.  He was seen in the walk-in clinic yesterday and diagnosed with a wound infection.  He was started on doxycycline but has not yet taken it.  He states that this morning, he started to have a large amount of mainly bloody drainage from the wound and became concerned.  He denies fever or chills, increased pain or swelling, or other acute symptoms.  He reports loose stools, which is somewhat chronic, and no blood in the stool, abdominal pain, or vomiting.      Physical Exam   Triage Vital Signs: ED Triage Vitals  Enc Vitals Group     BP 02/21/21 0831 112/81     Pulse Rate 02/21/21 0831 89     Resp 02/21/21 0831 16     Temp 02/21/21 0831 98 F (36.7 C)     Temp Source 02/21/21 0831 Oral     SpO2 02/21/21 0831 98 %     Weight 02/21/21 0832 135 lb (61.2 kg)     Height 02/21/21 0832 5' 5"  (1.651 m)     Head Circumference --      Peak Flow --      Pain Score 02/21/21 0832 4     Pain Loc --      Pain Edu? --      Excl. in [place]? --     Most recent vital signs: Vitals:   02/21/21 1430 02/21/21 1500  BP: 114/76 112/72  Pulse: 84 84  Resp: 16 16  Temp:    SpO2: 100% 99%     General: Awake, no distress.  CV:  Good peripheral perfusion.  Resp:  Normal effort.  Abd:  No distention.  Other:  Approximately 1 cm wound to the medial right buttock  with seton in place.  3 cm surrounding fluctuant mass.  No significant surrounding erythema, induration, or abnormal warmth.  Tiny amount of blood expressed from the wound, with no active bleeding or drainage.   ED Results / Procedures / Treatments   Labs (all labs ordered are listed, but only abnormal results are displayed) Labs Reviewed  COMPREHENSIVE METABOLIC PANEL - Abnormal; Notable for the following components:      Result Value   Calcium 8.7 (*)    AST 14 (*)    All other components within normal limits  CBC WITH DIFFERENTIAL/PLATELET - Abnormal; Notable for the following components:   RBC 5.85 (*)    MCH 25.8 (*)    All other components within normal limits  LACTIC ACID, PLASMA  LACTIC ACID, PLASMA  URINALYSIS, ROUTINE W REFLEX MICROSCOPIC     EKG     RADIOLOGY  CT pelvis: I reviewed the images, which show a small right buttock abscess in the location of the fluctuant mass on exam.  I reviewed the  radiology report which confirms presence of an abscess in this area:  IMPRESSION:  Right-sided transsphincteric perianal fistula again demonstrated,  with seton in place.     Increased size of 2.2 cm abscess in the subcutaneous tissues of the  right buttock, adjacent to the gluteal crease.    PROCEDURES:  Critical Care performed: No  Procedures   MEDICATIONS ORDERED IN ED: Medications  iohexol (OMNIPAQUE) 300 MG/ML solution 100 mL (100 mLs Intravenous Contrast Given 02/21/21 1350)     IMPRESSION / MDM / ASSESSMENT AND PLAN / ED COURSE  I reviewed the triage vital signs and the nursing notes.  36 year old male with PMH as noted above including Crohn's disease presents with persistent drainage from a wound to his right buttock with a seton in place.  On exam, the patient has a small wound to the right buttock with seton in place, and has a surrounding fluctuant mass.  There is no current active bleeding or significant drainage.  Overall presentation is most  consistent with a right buttock abscess.  I suspect that it is likely mostly drained on its own although there may be some remaining pus, versus a hematoma.  There is no evidence of active hemorrhage.  Given the acutely worsening symptoms, we will obtain a CT for further evaluation.  ----------------------------------------- 3:49 PM on 02/21/2021 -----------------------------------------  CT shows a 2 cm abscess in the medial right buttock.  I consulted Dr. Hampton Abbot from general surgery and discussed the case with him; he will come to the ED and evaluate the patient.  I signed the patient out to the oncoming ED physician Dr. Tamala Julian.   FINAL CLINICAL IMPRESSION(S) / ED DIAGNOSES   Final diagnoses:  Abscess of buttock, right     Rx / DC Orders   ED Discharge Orders     None        Note:  This document was prepared using Dragon voice recognition software and may include unintentional dictation errors.    Arta Silence, MD 02/21/21 1550

## 2021-03-22 ENCOUNTER — Other Ambulatory Visit: Payer: Self-pay

## 2021-03-22 ENCOUNTER — Emergency Department
Admission: EM | Admit: 2021-03-22 | Discharge: 2021-03-22 | Disposition: A | Discharge status: Home / Self Care | Payer: BC Managed Care – PPO | Attending: Emergency Medicine | Admitting: Emergency Medicine

## 2021-03-22 DIAGNOSIS — K50919 Crohn's disease, unspecified, with unspecified complications: Secondary | ICD-10-CM | POA: Diagnosis not present

## 2021-03-22 DIAGNOSIS — R109 Unspecified abdominal pain: Secondary | ICD-10-CM | POA: Diagnosis present

## 2021-03-22 LAB — COMPREHENSIVE METABOLIC PANEL
ALT: 28 U/L (ref 0–44)
AST: 21 U/L (ref 15–41)
Albumin: 3.7 g/dL (ref 3.5–5.0)
Alkaline Phosphatase: 97 U/L (ref 38–126)
Anion gap: 10 (ref 5–15)
BUN: 9 mg/dL (ref 6–20)
CO2: 23 mmol/L (ref 22–32)
Calcium: 8.8 mg/dL — ABNORMAL LOW (ref 8.9–10.3)
Chloride: 101 mmol/L (ref 98–111)
Creatinine, Ser: 0.87 mg/dL (ref 0.61–1.24)
GFR, Estimated: >60 mL/min (ref >60)
Glucose, Bld: 116 mg/dL — ABNORMAL HIGH (ref 70–99)
Potassium: 4 mmol/L (ref 3.5–5.1)
Sodium: 134 mmol/L — ABNORMAL LOW (ref 135–145)
Total Bilirubin: 1 mg/dL (ref 0.3–1.2)
Total Protein: 7.6 g/dL (ref 6.5–8.1)

## 2021-03-22 LAB — CBC
HCT: 49.3 % (ref 39.0–52.0)
Hemoglobin: 14.6 g/dL (ref 13.0–17.0)
MCH: 25.8 pg — ABNORMAL LOW (ref 26.0–34.0)
MCHC: 29.6 g/dL — ABNORMAL LOW (ref 30.0–36.0)
MCV: 87.1 fL (ref 80.0–100.0)
Platelets: 314 10*3/uL (ref 150–400)
RBC: 5.66 MIL/uL (ref 4.22–5.81)
RDW: 14.1 % (ref 11.5–15.5)
WBC: 11.7 10*3/uL — ABNORMAL HIGH (ref 4.0–10.5)
nRBC: 0 % (ref 0.0–0.2)

## 2021-03-22 LAB — URINALYSIS, ROUTINE W REFLEX MICROSCOPIC
Bilirubin Urine: NEGATIVE
Glucose, UA: NEGATIVE mg/dL
Hgb urine dipstick: NEGATIVE
Ketones, ur: NEGATIVE mg/dL
Leukocytes,Ua: NEGATIVE
Nitrite: NEGATIVE
Protein, ur: NEGATIVE mg/dL
Specific Gravity, Urine: 1.027 (ref 1.005–1.030)
pH: 5 (ref 5.0–8.0)

## 2021-03-22 LAB — LIPASE, BLOOD: Lipase: 37 U/L (ref 11–51)

## 2021-03-22 MED ORDER — AMOXICILLIN-POT CLAVULANATE 875-125 MG PO TABS: 1.0000 | ORAL_TABLET | Freq: Two times a day (BID) | ORAL | 0 refills | Status: AC

## 2021-03-22 MED ORDER — PREDNISONE 50 MG PO TABS
50.0000 mg | ORAL_TABLET | Freq: Every day | ORAL | 0 refills | Status: AC
Start: 2021-03-22 — End: ?

## 2021-03-22 NOTE — ED Provider Notes (Signed)
[facility] Provider Note    Event Date/Time   First MD Initiated Contact with Patient 03/22/21 1108     (approximate)   History   Abdominal Pain   HPI  Leroy Hernandez is a 36 y.o. male with history of Crohn's disease who presents with complaints of abdominal cramping as well as more diarrhea than typical.  He reports this is consistent with Crohn's flares that he has had in the past.  Has had abdominal surgeries related to Crohn's, does have a known fistula.  Denies fevers or chills.  Some nausea no vomiting reports symptoms of been ongoing for the last several days.  Currently is not receiving any treatment for Crohn's disease because of issues getting medications from his doctor in [facility], is requesting local GI     Physical Exam   Triage Vital Signs: ED Triage Vitals [03/22/21 0955]  Enc Vitals Group     BP 126/75     Pulse Rate (!) 102     Resp 18     Temp 98.6 F (37 C)     Temp Source Oral     SpO2 100 %     Weight 61.2 kg (135 lb)     Height 1.651 m (5' 5" )     Head Circumference      Peak Flow      Pain Score 6     Pain Loc      Pain Edu?      Excl. in [place]?     Most recent vital signs: Vitals:   03/22/21 0955  BP: 126/75  Pulse: (!) 102  Resp: 18  Temp: 98.6 F (37 C)  SpO2: 100%     General: Awake, no distress.  CV:  Good peripheral perfusion.  Resp:  Normal effort.  Abd:  No distention.  Soft, nontender, nonsurgical abdomen Other:     ED Results / Procedures / Treatments   Labs (all labs ordered are listed, but only abnormal results are displayed) Labs Reviewed  COMPREHENSIVE METABOLIC PANEL - Abnormal; Notable for the following components:      Result Value   Sodium 134 (*)    Glucose, Bld 116 (*)    Calcium 8.8 (*)    All other components within normal limits  CBC - Abnormal; Notable for the following components:   WBC 11.7 (*)    MCH 25.8 (*)    MCHC 29.6 (*)    All other components within normal  limits  URINALYSIS, ROUTINE W REFLEX MICROSCOPIC - Abnormal; Notable for the following components:   Color, Urine YELLOW (*)    APPearance CLEAR (*)    All other components within normal limits  LIPASE, BLOOD     EKG     RADIOLOGY     PROCEDURES:  Critical Care performed:   Procedures   MEDICATIONS ORDERED IN ED: Medications - No data to display   IMPRESSION / MDM / [place] / ED COURSE  I reviewed the triage vital signs and the nursing notes.  Patient presents with abdominal pain as detailed above, overall is well-appearing and in no acute distress.  Abdominal exam is reassuring.  He states this feels consistent with a Crohn's flare which he has had numerous times in the past.  His lab work is reassuring, CMP is unremarkable, LFTs are normal, CBC demonstrates mild elevation of white blood cell count which is nonspecific  Urinalysis is normal  Considered admission however  discussed  with patient trial of prednisone and antibiotics, he states this has worked well for him in the past.    He notes that he can return if any worsening, appropriate for discharge at this time        FINAL CLINICAL IMPRESSION(S) / ED DIAGNOSES   Final diagnoses:  Crohn's disease with complication, unspecified gastrointestinal tract location [facility])     Rx / DC Orders   ED Discharge Orders          Ordered    predniSONE (DELTASONE) 50 MG tablet  Daily with breakfast        03/22/21 1122    amoxicillin-clavulanate (AUGMENTIN) 875-125 MG tablet  2 times daily        03/22/21 1122             Note:  This document was prepared using [place] voice recognition software and may include unintentional dictation errors.   Lavonia Drafts, MD 03/22/21 1217

## 2021-03-22 NOTE — Discharge Instructions (Addendum)
Please follow-up with Dr. Haig Prophet, call on Monday to arrange appointment and let them know you have been referred from the emergency department

## 2021-03-22 NOTE — ED Triage Notes (Signed)
Pt to ED for possible crohns flare up that started a week ago. +n/v after eating. Reports having trouble following up with GI

## 2021-03-27 ENCOUNTER — Other Ambulatory Visit: Payer: Self-pay | Admitting: Gastroenterology

## 2021-03-27 DIAGNOSIS — K610 Anal abscess: Secondary | ICD-10-CM

## 2021-03-27 DIAGNOSIS — K50813 Crohn's disease of both small and large intestine with fistula: Secondary | ICD-10-CM

## 2021-04-15 ENCOUNTER — Other Ambulatory Visit: Payer: Self-pay | Admitting: Gastroenterology

## 2021-04-15 DIAGNOSIS — K610 Anal abscess: Secondary | ICD-10-CM

## 2021-04-15 DIAGNOSIS — K50813 Crohn's disease of both small and large intestine with fistula: Secondary | ICD-10-CM

## 2021-04-27 ENCOUNTER — Ambulatory Visit: Payer: BC Managed Care – PPO

## 2021-07-03 ENCOUNTER — Emergency Department
Admission: EM | Admit: 2021-07-03 | Discharge: 2021-07-03 | Disposition: A | Discharge status: Home / Self Care | Payer: BC Managed Care – PPO | Attending: Emergency Medicine | Admitting: Emergency Medicine

## 2021-07-03 ENCOUNTER — Emergency Department: Payer: BC Managed Care – PPO

## 2021-07-03 DIAGNOSIS — K6289 Other specified diseases of anus and rectum: Secondary | ICD-10-CM | POA: Diagnosis present

## 2021-07-03 DIAGNOSIS — K610 Anal abscess: Secondary | ICD-10-CM | POA: Insufficient documentation

## 2021-07-03 LAB — CBC WITH DIFFERENTIAL/PLATELET
Abs Immature Granulocytes: 0.06 10*3/uL (ref 0.00–0.07)
Basophils Absolute: 0.1 10*3/uL (ref 0.0–0.1)
Basophils Relative: 1 %
Eosinophils Absolute: 0.4 10*3/uL (ref 0.0–0.5)
Eosinophils Relative: 2 %
HCT: 46.1 % (ref 39.0–52.0)
Hemoglobin: 14.9 g/dL (ref 13.0–17.0)
Immature Granulocytes: 0 %
Lymphocytes Relative: 11 %
Lymphs Abs: 1.7 10*3/uL (ref 0.7–4.0)
MCH: 26.8 pg (ref 26.0–34.0)
MCHC: 32.3 g/dL (ref 30.0–36.0)
MCV: 82.8 fL (ref 80.0–100.0)
Monocytes Absolute: 1.2 10*3/uL — ABNORMAL HIGH (ref 0.1–1.0)
Monocytes Relative: 7 %
Neutro Abs: 12.7 10*3/uL — ABNORMAL HIGH (ref 1.7–7.7)
Neutrophils Relative %: 79 %
Platelets: 321 10*3/uL (ref 150–400)
RBC: 5.57 MIL/uL (ref 4.22–5.81)
RDW: 13.8 % (ref 11.5–15.5)
WBC: 16.1 10*3/uL — ABNORMAL HIGH (ref 4.0–10.5)
nRBC: 0 % (ref 0.0–0.2)

## 2021-07-03 LAB — COMPREHENSIVE METABOLIC PANEL
ALT: 18 U/L (ref 0–44)
AST: 16 U/L (ref 15–41)
Albumin: 3.7 g/dL (ref 3.5–5.0)
Alkaline Phosphatase: 76 U/L (ref 38–126)
Anion gap: 8 (ref 5–15)
BUN: 8 mg/dL (ref 6–20)
CO2: 27 mmol/L (ref 22–32)
Calcium: 9.1 mg/dL (ref 8.9–10.3)
Chloride: 103 mmol/L (ref 98–111)
Creatinine, Ser: 0.82 mg/dL (ref 0.61–1.24)
GFR, Estimated: >60 mL/min (ref >60)
Glucose, Bld: 104 mg/dL — ABNORMAL HIGH (ref 70–99)
Potassium: 3.7 mmol/L (ref 3.5–5.1)
Sodium: 138 mmol/L (ref 135–145)
Total Bilirubin: 0.8 mg/dL (ref 0.3–1.2)
Total Protein: 7.1 g/dL (ref 6.5–8.1)

## 2021-07-03 LAB — GROUP A STREP BY PCR: Group A Strep by PCR: NOT DETECTED

## 2021-07-03 LAB — PROCALCITONIN: Procalcitonin: <0.1 ng/mL

## 2021-07-03 LAB — LACTIC ACID, PLASMA: Lactic Acid, Venous: 0.8 mmol/L (ref 0.5–1.9)

## 2021-07-03 MED ORDER — CIPROFLOXACIN IN D5W 400 MG/200ML IV SOLN
400.0000 mg | Freq: Once | INTRAVENOUS | Status: AC
  Administered 2021-07-03: 400 mg via INTRAVENOUS
  Filled 2021-07-03: qty 200

## 2021-07-03 MED ORDER — IOHEXOL 350 MG/ML SOLN: 75.0000 mL | Freq: Once | INTRAVENOUS | Status: DC | PRN

## 2021-07-03 MED ORDER — IOHEXOL 300 MG/ML  SOLN
100.0000 mL | Freq: Once | INTRAMUSCULAR | Status: AC | PRN
  Administered 2021-07-03: 100 mL via INTRAVENOUS

## 2021-07-03 MED ORDER — METRONIDAZOLE 500 MG/100ML IV SOLN
500.0000 mg | Freq: Once | INTRAVENOUS | Status: AC
  Administered 2021-07-03: 500 mg via INTRAVENOUS
  Filled 2021-07-03: qty 100

## 2021-07-03 MED ORDER — CIPROFLOXACIN HCL 500 MG PO TABS: 500.0000 mg | ORAL_TABLET | Freq: Two times a day (BID) | ORAL | 0 refills | Status: AC

## 2021-07-03 MED ORDER — SODIUM CHLORIDE 0.9 % IV BOLUS
1000.0000 mL | Freq: Once | INTRAVENOUS | Status: AC
  Administered 2021-07-03: 1000 mL via INTRAVENOUS

## 2021-07-03 MED ORDER — METRONIDAZOLE 500 MG PO TABS: 500.0000 mg | ORAL_TABLET | Freq: Two times a day (BID) | ORAL | 0 refills | Status: AC

## 2021-07-03 NOTE — ED Notes (Signed)
Attempted IV/blood draw x2 without success.

## 2021-07-03 NOTE — Discharge Instructions (Signed)
Take ciprofloxacin twice daily for 10 days. Take Flagyl twice daily for 10 days. Please follow-up with your GI doctor, Dr. Drema Dallas.

## 2021-07-03 NOTE — ED Provider Notes (Signed)
[facility] Provider Note  Patient Contact: 3:19 PM (approximate)   History   Rectal Pain   HPI  Leroy Hernandez is a 36 y.o. 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  Patient states that he has had worsening pain of the left buttocks over the past 2 to 3 days.  Patient states that it feels like a "hard knot" with no drainage.  He states that he has diarrhea every time he eats and has had diarrhea ever since stopping his biologic 1 year ago.  Patient states that he anticipates restarting his biologic in 1 month.  Patient was last admitted in March of this year and had CT enterography that indicated a 1 cm fluid collection and perirectal fistula and active inflammation and stricture noted of small bowel.  Patient also has inflammation involving the rectum and sigmoid colon.  Patient had seton drain placed by GI at [facility] after drain became displaced during admission.  In terms of pain, patient states that he does not have a tremendous amount of abdominal pain does not think that he is experiencing recurrent Crohn's flare at this time.      Physical Exam   Triage Vital Signs: ED Triage Vitals [07/03/21 1500]  Enc Vitals Group     BP 119/78     Pulse Rate 90     Resp 16     Temp 98.6 F (37 C)     Temp Source Oral     SpO2 99 %     Weight      Height      Head Circumference      Peak Flow      Pain Score      Pain Loc      Pain Edu?      Excl. in [place]?     Most recent vital signs: Vitals:   07/03/21 1830 07/03/21 2151  BP: 109/70 108/70  Pulse: 92 86  Resp: 18 16  Temp:    SpO2: 98% 94%     General: Alert and in no acute distress. Eyes:  PERRL. EOMI. Head: No acute traumatic findings ENT:      Ears: Tms are pearly.       Nose: No congestion/rhinnorhea.       Mouth/Throat: Mucous membranes are moist. Neck: No stridor. No cervical spine tenderness to palpation. Cardiovascular:  Good peripheral perfusion Respiratory: Normal respiratory effort without tachypnea or retractions. Lungs CTAB. Good air entry to the bases with no decreased or absent breath sounds. Gastrointestinal: Bowel sounds 4 quadrants. Soft and nontender to palpation. No guarding or rigidity. No palpable masses. No distention. No CVA tenderness.  Patient has left-sided perianal induration without perirectal involvement. Musculoskeletal: Full range of motion to all extremities.  Neurologic:  No gross focal neurologic deficits are appreciated.  Skin:   No rash noted Other:   ED Results / Procedures / Treatments   Labs (all labs ordered are listed, but only abnormal results are displayed) Labs Reviewed  CBC WITH DIFFERENTIAL/PLATELET - Abnormal; Notable for the following components:      Result Value   WBC 16.1 (*)    Neutro Abs 12.7 (*)    Monocytes Absolute 1.2 (*)    All other components within normal limits  COMPREHENSIVE METABOLIC PANEL - Abnormal; Notable for the  following components:   Glucose, Bld 104 (*)    All other components within normal limits  GROUP A STREP BY PCR  LACTIC ACID, PLASMA  PROCALCITONIN        RADIOLOGY  I personally viewed and evaluated these images as part of my medical decision making, as well as reviewing the written report by the radiologist.  ED Provider Interpretation: I personally interpreted CT abdomen pelvis and agree with radiologist interpretation.  Patient had a 2.3 x 1.3 cm loculated fluid collection in the left perianal region concerning for perianal abscess   PROCEDURES:  Critical Care performed: No  Procedures   MEDICATIONS ORDERED IN ED: Medications  sodium chloride 0.9 % bolus 1,000 mL (0 mLs Intravenous Stopped 07/03/21 1825)  iohexol (OMNIPAQUE) 300 MG/ML solution 100 mL (100 mLs Intravenous Contrast Given  07/03/21 1735)  ciprofloxacin (CIPRO) IVPB 400 mg (0 mg Intravenous Stopped 07/03/21 2148)  metroNIDAZOLE (FLAGYL) IVPB 500 mg (0 mg Intravenous Stopped 07/03/21 2247)     IMPRESSION / MDM / ASSESSMENT AND PLAN / ED COURSE  I reviewed the triage vital signs and the nursing notes.                              Assessment and plan Perianal pain:  36 year old male presents to the emergency department with left-sided perianal pain and concern for possible new fistula.  Patient was mildly tachycardic given sepsis criteria at triage but vital signs were otherwise reassuring.  Patient was alert, active and nontoxic-appearing.  He had a 4 cm x 3 cm region of left-sided perianal induration without overlying erythema.  Seton drain was in place on the right.  Patient had elevated white blood cell count with left shift on CBC.  CMP unremarkable.  Will obtain procalcitonin and lactic acid given tachycardia and leukocytosis.  Will obtain CT abdomen pelvis to better characterize possible fistula versus perianal cellulitis versus abscess  Lactic and procalcitonin within range. CT abdomen pelvis concerning for perianal abscess.  I reached out to general surgeon on-call, Dr. Lysle Pearl.  Dr. Lysle Pearl personally evaluated patient at bedside and personally reached out to [facility].  Dr. Lysle Pearl did not receive a response back from [facility] colorectal team.  Dr. Lysle Pearl stated from his perspective, patient could take antibiotics orally as an outpatient and follow-up with his GI physician, Dr. Drema Dallas.  I also reached out to [facility] colorectal team and did not receive a phoned response.  Before [facility] consult could be complted, patient requested to be discharged with antibiotics.  I cautioned patient to return to the emergency department with new or worsening symptoms.  All patient questions were answered.    Clinical Course as of 07/03/21 2316  Fri Jul 03, 2021  1628 NEUT#(!): 12.7 [JW]    Clinical Course User Index [JW] Lannie Bryant, [place]      FINAL CLINICAL IMPRESSION(S) / ED DIAGNOSES   Final diagnoses:  Perianal abscess     Rx / DC Orders   ED Discharge Orders          Ordered    ciprofloxacin (CIPRO) 500 MG tablet  2 times daily        07/03/21 2244    metroNIDAZOLE (FLAGYL) 500 MG tablet  2 times daily        07/03/21 2244             Note:  This document was prepared using Dragon voice recognition software and may  include unintentional dictation errors.   Vallarie Mare Oakes, PA-C 07/03/21 2316    Blake Divine, MD 07/07/21 [id number]

## 2021-07-03 NOTE — ED Triage Notes (Signed)
Pt presents to ED with c/o of L buttock pain. Pt states HX of crohns and states he currently has a fissure to R buttock and has a drain. Pt denies fevers or chills. Pt concerned the same is happening on the L side.

## 2021-07-03 NOTE — Consult Note (Signed)
Subjective:   CC: perirectal abscess  HPI:  Leroy Hernandez is a 36 y.o. male who was consulted by Sherral Hammers for evaluation of above.  First noted a few days ago.  Symptoms include: Pain is sharp, noted in perianal area.  Exacerbated by nothing specific.  Alleviated by nothing specific.  Associated with increasing abdominal pain as well.  Hx of Crohn's currently seen at [facility], Dr. Harley Hallmark, last visit 06/09/21 and Dr. Gwynneth Macleod- colorectal surgery last visit 05/25/21, surgery 04/24/21 for issues above.  Recently tapered off steroids and still has seton in place from recent surgery for persistent perirectal abscess and fistula.     Past Medical History:  has a past medical history of Anxiety, Asthma, Blood in stool, Calculus of gallbladder without cholecystitis without obstruction (12/25/2015), Crohn's disease ([place]), Depression, Exacerbation of Crohn's disease ([place]) (09/10/2017), Gallbladder polyp (12/25/2015), Gastroenteritis due to norovirus (05/03/2017), Generalized abdominal pain, H/O Clostridium difficile infection (05/03/2017), Iron deficiency anemia (12/28/2016), Iron deficiency anemia (12/28/2016), Perirectal abscess (07/04/2015), Rectal fistula, Sepsis ([place]) (06/22/2016), and Squamous cell carcinoma of skin (07/19/2018).  Past Surgical History:  has a past surgical history that includes rectal fistula surgery; Wrist surgery; Colon surgery; Colon resection; Colonoscopy with propofol (N/A, 04/03/2015); Colonoscopy with propofol (N/A, 12/22/2015); Colonoscopy with propofol (N/A, 12/22/2016); Esophagogastroduodenoscopy (egd) with propofol (N/A, 12/22/2016); Colonoscopy with propofol (N/A, 09/14/2017); Colonoscopy (N/A, 11/30/2018); and Rectal surgery (2021).  Family History: family history includes Depression in his mother; Diabetes in his paternal grandfather; Drug abuse in his mother; Heart disease in his paternal grandfather.  Social History:  reports that he has never smoked. He has quit using smokeless  tobacco. He reports current alcohol use. He reports that he does not use drugs.  Current Medications:  Prior to Admission medications   Medication Sig Start Date End Date Taking? Authorizing Provider  Adapalene-Benzoyl Peroxide (EPIDUO FORTE) 0.3-2.5 % GEL Apply 1 application topically at bedtime. Qhs to back, shoulders, chest for acne Patient not taking: Reported on 02/21/2021 10/09/20   Ralene Bathe, MD  ARIPiprazole (ABILIFY) 5 MG tablet Take 5 mg by mouth daily. 12/15/18   [provider]  citalopram (CELEXA) 20 MG tablet Take 20 mg by mouth every morning. 12/23/20   [provider]  divalproex (DEPAKOTE ER) 500 MG 24 hr tablet Take 1,000 mg by mouth at bedtime. 08/03/19   [provider]  escitalopram (LEXAPRO) 10 MG tablet Take 1 tablet (10 mg total) by mouth daily. Patient not taking: Reported on 02/21/2021 02/20/18   Ursula Alert, MD  hydrOXYzine (VISTARIL) 25 MG capsule Take 1 capsule (25 mg total) by mouth daily as needed (severe anxiety attacks). Patient not taking: Reported on 02/21/2021 07/15/17   Ursula Alert, MD  LAGEVRIO 200 MG CAPS capsule Take 4 capsules by mouth 2 (two) times daily. 02/15/21   [provider]  loperamide (IMODIUM) 2 MG capsule Take by mouth 4 (four) times daily as needed. 03/20/19   [provider]  meloxicam (MOBIC) 7.5 MG tablet Take 7.5 mg by mouth daily as needed. 01/29/21   [provider]  ondansetron (ZOFRAN ODT) 4 MG disintegrating tablet Take 1 tablet (4 mg total) by mouth every 8 (eight) hours as needed. Patient not taking: Reported on 02/21/2021 12/25/20   Rudene Re, MD  predniSONE (DELTASONE) 50 MG tablet Take 1 tablet (50 mg total) by mouth daily with breakfast. 03/22/21   Lavonia Drafts, MD  promethazine-dextromethorphan (PROMETHAZINE-DM) 6.25-15 MG/5ML syrup Take 5 mLs by mouth every 6 (six) hours as needed.  02/15/21   [provider]  STELARA 90 MG/ML SOSY injection Inject 90 mg  into the skin every 8 (eight) weeks.  Patient not taking: Reported on 10/09/2020 09/19/19   [provider]    Allergies:  Allergies  Allergen Reactions   Peanut Oil Itching    ROS:  General: Denies weight loss, weight gain, fatigue, fevers, chills, and night sweats. Eyes: Denies blurry vision, double vision, eye pain, itchy eyes, and tearing. Ears: Denies hearing loss, earache, and ringing in ears. Nose: Denies sinus pain, congestion, infections, runny nose, and nosebleeds. Mouth/throat: Denies hoarseness, sore throat, bleeding gums, and difficulty swallowing. Heart: Denies chest pain, palpitations, racing heart, irregular heartbeat, leg pain or swelling, and decreased activity tolerance. Respiratory: Denies breathing difficulty, shortness of breath, wheezing, cough, and sputum. GI: Denies change in appetite, heartburn, nausea, vomiting, constipation, diarrhea, and blood in stool. GU: Denies difficulty urinating, pain with urinating, urgency, frequency, blood in urine. Musculoskeletal: Denies joint stiffness, pain, swelling, muscle weakness. Skin: Denies rash, itching, mass, tumors, sores, and boils Neurologic: Denies headache, fainting, dizziness, seizures, numbness, and tingling. Psychiatric: Denies depression, anxiety, difficulty sleeping, and memory loss. Endocrine: Denies heat or cold intolerance, and increased thirst or urination. Blood/lymph: Denies easy bruising, easy bruising, and swollen glands     Objective:     BP 109/70   Pulse 92   Temp 98.6 F (37 C) (Oral)   Resp 18   SpO2 98%   Constitutional :  alert, cooperative, appears stated age, and no distress  Lymphatics/Throat:  no asymmetry, masses, or scars  Respiratory:  clear to auscultation bilaterally  Cardiovascular:  regular rate and rhythm  Gastrointestinal: Soft, minor TTP in suprapubic, LLQ .  Musculoskeletal: Steady gait and movement  Skin: Cool and moist  Psychiatric: Normal affect,  non-agitated, not confused  Rectal: Delphina Cahill noted in place on right area with extensive scar, mucosal changes consistent with chronic crohns.  Similar chronic induration of skin tag? Protruding from anus.  Area of tenderness along left anal verge, but unable to appreciate area of fluctuance within the chronic tissue changes in the area.  No obvious discharge or erythema otherwise.    LABS:     Latest Ref Rng & Units 07/03/2021    2:08 PM 03/22/2021    9:57 AM 02/21/2021    8:36 AM  CMP  Glucose 70 - 99 mg/dL 104   116   97    BUN 6 - 20 mg/dL 8   9   6     Creatinine 0.61 - 1.24 mg/dL 0.82   0.87   0.86    Sodium 135 - 145 mmol/L 138   134   136    Potassium 3.5 - 5.1 mmol/L 3.7   4.0   3.8    Chloride 98 - 111 mmol/L 103   101   100    CO2 22 - 32 mmol/L 27   23   27     Calcium 8.9 - 10.3 mg/dL 9.1   8.8   8.7    Total Protein 6.5 - 8.1 g/dL 7.1   7.6   7.7    Total Bilirubin 0.3 - 1.2 mg/dL 0.8   1.0   0.8    Alkaline Phos 38 - 126 U/L 76   97   91    AST 15 - 41 U/L 16   21   14     ALT 0 - 44 U/L 18   28   13  Latest Ref Rng & Units 07/03/2021    2:08 PM 03/22/2021    9:57 AM 02/21/2021    8:36 AM  CBC  WBC 4.0 - 10.5 K/uL 16.1   11.7   9.8    Hemoglobin 13.0 - 17.0 g/dL 14.9   14.6   15.1    Hematocrit 39.0 - 52.0 % 46.1   49.3   47.0    Platelets 150 - 400 K/uL 321   314   330      RADS: CLINICAL DATA:  Perianal pain   EXAM: CT ABDOMEN AND PELVIS WITH CONTRAST   TECHNIQUE: Multidetector CT imaging of the abdomen and pelvis was performed using the standard protocol following bolus administration of intravenous contrast.   RADIATION DOSE REDUCTION: This exam was performed according to the departmental dose-optimization program which includes automated exposure control, adjustment of the mA and/or kV according to patient size and/or use of iterative reconstruction technique.   CONTRAST:  165m OMNIPAQUE IOHEXOL 300 MG/ML  SOLN   COMPARISON:  Previous studies  including the CT abdomen and pelvis done on 12/30/2020 and CT pelvis done on 02/21/2021   FINDINGS: Lower chest: Unremarkable.   Hepatobiliary: Liver measures 17 cm in length. No focal abnormality is seen. There is no dilation of bile ducts. There is calcified gallbladder stone.   Pancreas: No focal abnormality is seen.   Spleen: Unremarkable.   Adrenals/Urinary Tract: Adrenals are not enlarged. There is no hydronephrosis. There are no renal or ureteral stones. There is 8 mm low-density in the midportion of right kidney, possibly a cyst. Urinary bladder is unremarkable.   Stomach/Bowel: Surgical staples are seen in the stomach. There is no significant small bowel dilation. Right colon is not seen. Ileocolic anastomosis appears to be patent. There is mild diffuse wall thickening in the left colon, especially in the sigmoid. This finding has not changed significantly. There is no pericolic stranding or fluid collection. There is mild stranding in the fat planes adjacent to the rectum on the left side. There is interval appearance of a 2.6 x 1.3 cm loculated fluid collection in the left perianal region. There is significant inflammatory stranding in the adjacent fat planes. There is linear radiopacity, possibly a seton in the perianal region which is not fully included in the images limiting evaluation. There is linear area of increased density in the right perianal region without any inflammatory stranding or fluid collection.   Vascular/Lymphatic: Unremarkable.   Reproductive: Unremarkable.   Other: There is no ascites or pneumoperitoneum. Small umbilical hernia containing fat is seen.   Musculoskeletal: Unremarkable.   IMPRESSION: There is interval appearance 2.6 x 1.3 cm left perianal abscess with significant adjacent inflammatory stranding.   There is mild diffuse wall thickening in the left colon suggesting inflammatory or infectious colitis. There is previous  right hemicolectomy.   There is no evidence of intestinal obstruction or pneumoperitoneum. There is no hydronephrosis.   Gallbladder stone.   Other findings as described in the body of the report.     Electronically Signed   By: PElmer PickerM.D.   On: 07/03/2021 18:04  Assessment:   Recurrent crohn's flare  And new left sided perirectal fistula Right side rectal fistula with seton still in place  Plan:     [facility] suggested discuss case with colorectal, no return page despite multiple attempts.  Recommend abx and urgent outpt management with [facility] due to complexity and chronicity of case. Minimally symptomatic  at this time, so no need for hospitalization or urgent surgical management at this time.  The patient verbalized understanding and all questions were answered to the patient's satisfaction, agreeable to said plan as well at time of exam.  labs/images/medications/previous chart entries reviewed personally and relevant changes/updates noted above.

## 2021-07-03 NOTE — ED Notes (Signed)
[facility] Consultation spoke to [place] who will page for provider

## 2021-07-28 IMAGING — CT CT ABD-PELV W/ CM
2 of 5 series · 15 of 46 positions shown, 17 images · IV contrast (APPLIED)
Comparison: CT 03/18/2019 and 09/10/2017.

CLINICAL DATA: Generalized abdominal cramping with bloody diarrhea
for 2 days. History of Crohn's disease. Nonhealing right buttock
wound. Previous colon surgery.

EXAM:
CT ABDOMEN AND PELVIS WITH CONTRAST
TECHNIQUE: Multidetector CT imaging of the abdomen and pelvis was performed
using the standard protocol following bolus administration of
intravenous contrast.
CONTRAST:  100mL OMNIPAQUE IOHEXOL 300 MG/ML  SOLN

[Series 2: routine abd/pel with · axial · 0.64mm/px · z∈[-430,-55]mm · 12 of 83 slices shown, 14 images]
[im 4/83  soft-tissue]
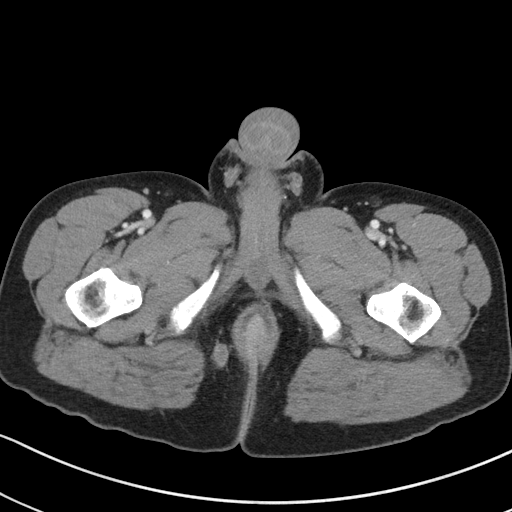
[im 4/83  bone]
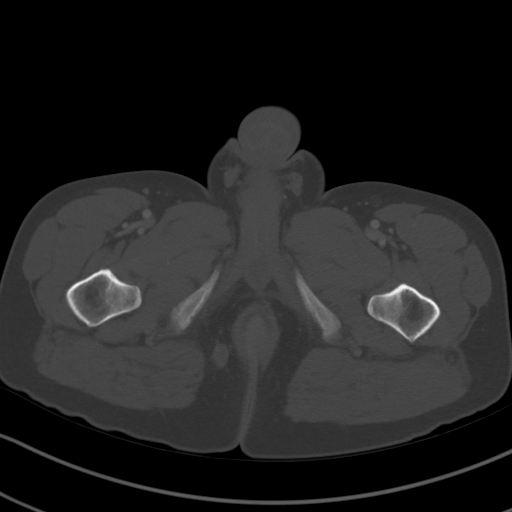
[im 12/83  soft-tissue]
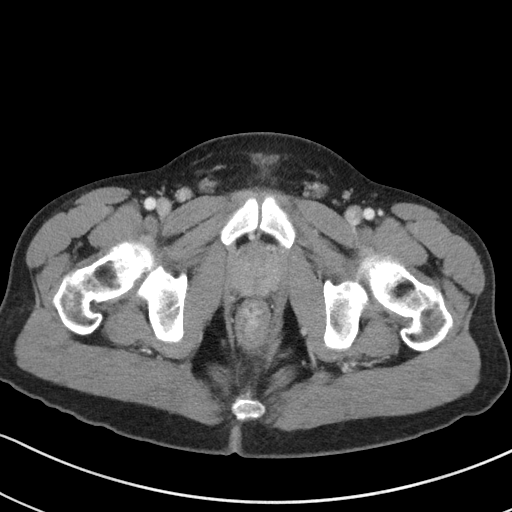
[im 19/83  soft-tissue]
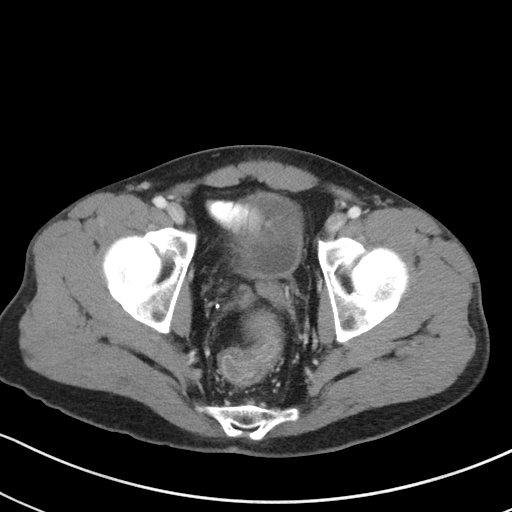
[im 27/83  soft-tissue]
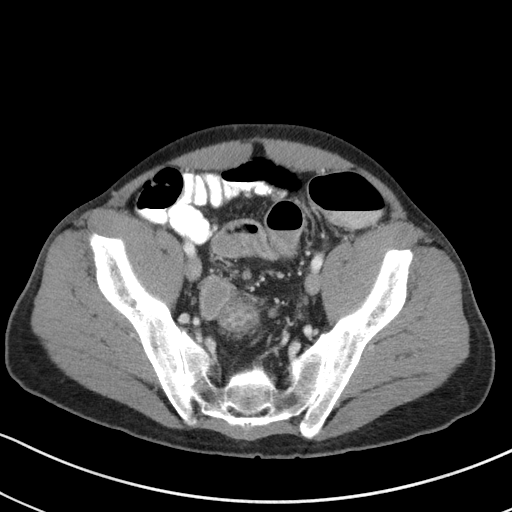
[im 30/83  soft-tissue]
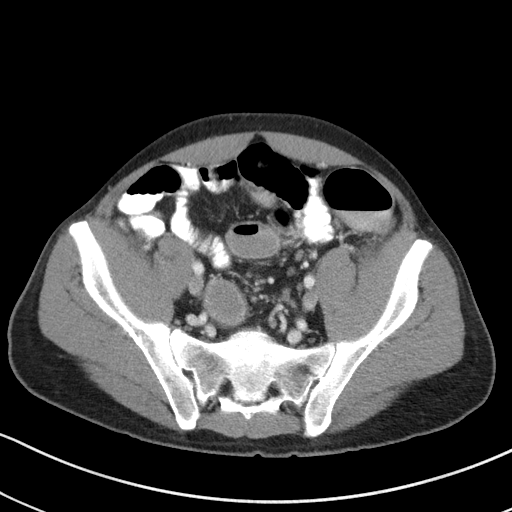
[im 38/83  soft-tissue]
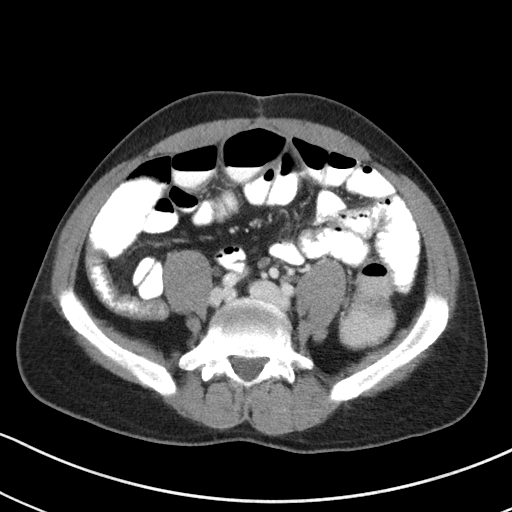
[im 45/83  soft-tissue]
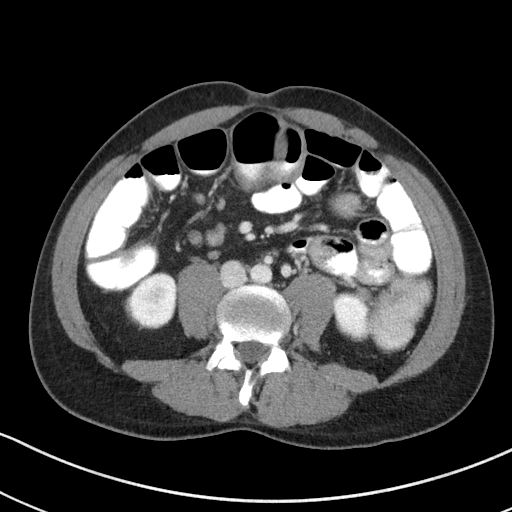
[im 53/83  soft-tissue]
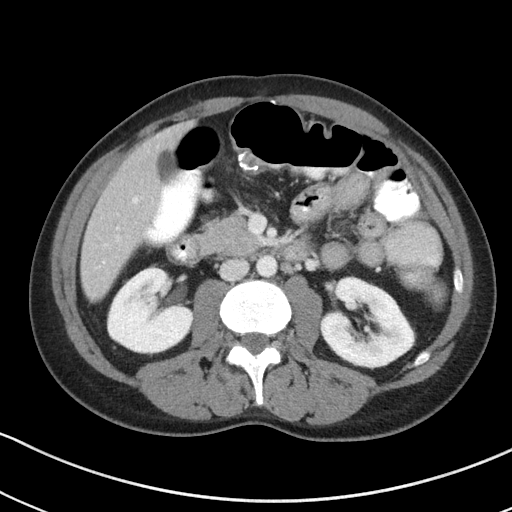
[im 56/83  soft-tissue]
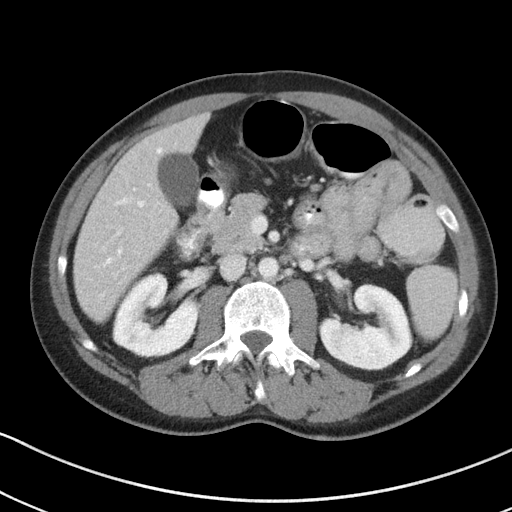
[im 56/83  bone]
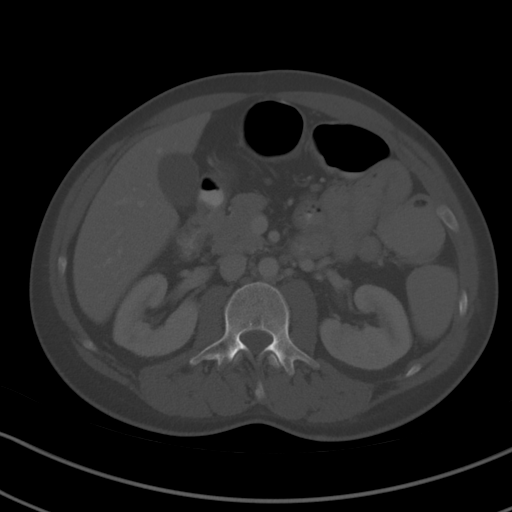
[im 64/83  soft-tissue]
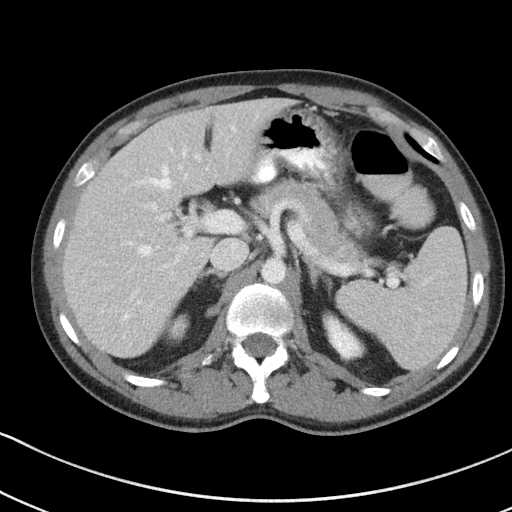
[im 71/83  soft-tissue]
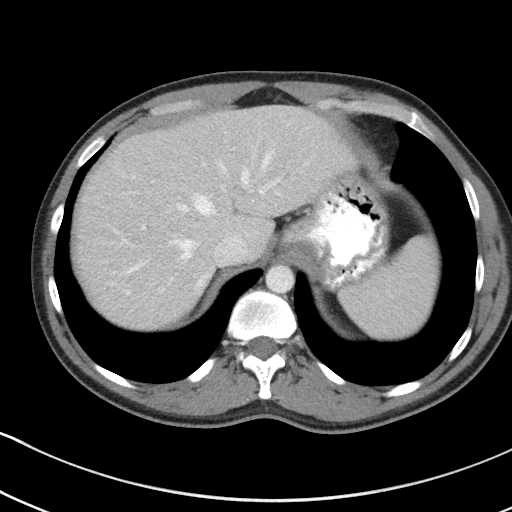
[im 79/83  soft-tissue]
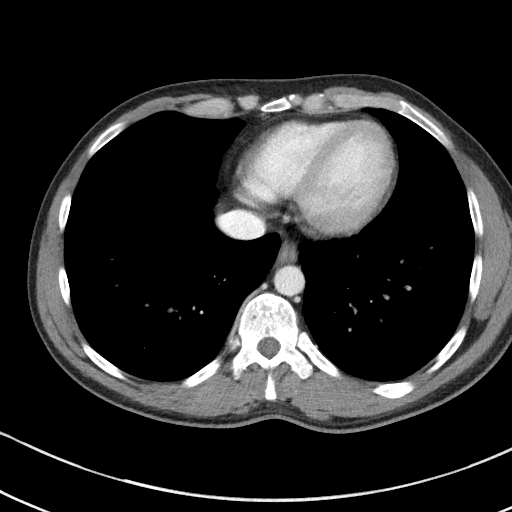

[Series 7: coronal st · coronal · 0.70mm/px · 3 of 78 slices shown]
[im 26/78  soft-tissue]
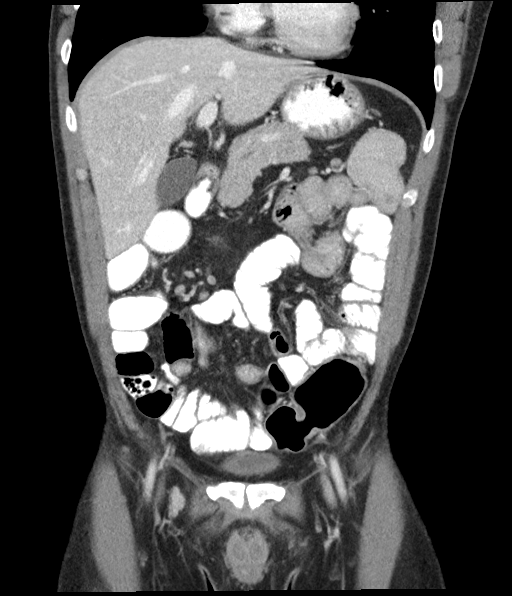
[im 35/78  soft-tissue]
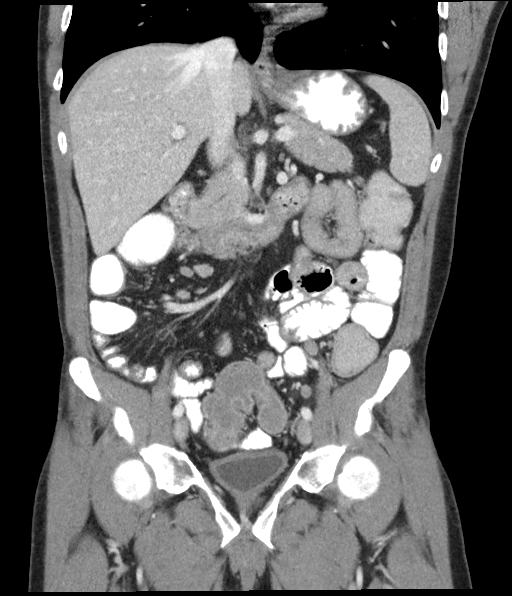
[im 43/78  soft-tissue]
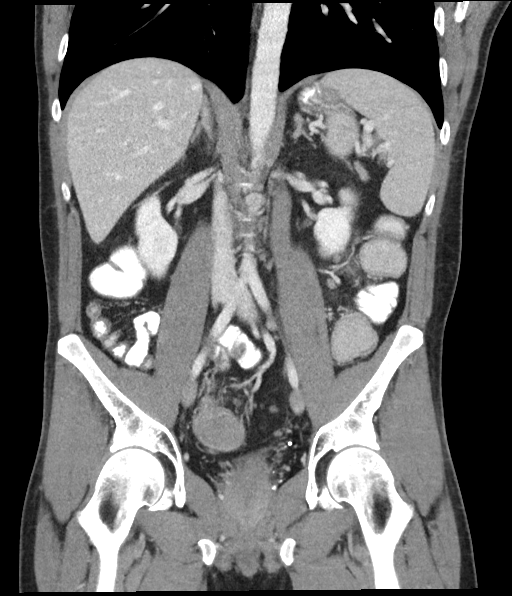

[15 of 46 positions shown; findings below may reference images not displayed]

FINDINGS: Lower chest: Clear lung bases. No significant pleural or pericardial
effusion.

Hepatobiliary: The liver is normal in density without suspicious
focal abnormality. Small calcified gallstones are again noted. There
is no gallbladder wall thickening or biliary dilatation.

Pancreas: Unremarkable. No pancreatic ductal dilatation or
surrounding inflammatory changes.

Spleen: Normal in size without focal abnormality.

Adrenals/Urinary Tract: Both adrenal glands appear normal. Stable
small cyst in the upper interpolar region of the right kidney. The
kidneys otherwise appear normal without urinary tract calculus or
hydronephrosis. The bladder appears normal for its degree of
distention.

Stomach/Bowel: There are stable postsurgical changes status post
right hemicolectomy and ileocolonic anastomosis at the mid
transverse colon level. The stomach and proximal small bowel appear
normal. No significant small bowel distension. There is a loop of
incompletely distended small bowel in the right mid abdomen at the
level of the iliac crest which shows mild wall thickening, likely
due to incomplete distention. No surrounding inflammation. Mild wall
thickening of the distal small bowel just proximal to the
anastomosis appears improved. Wall thickening throughout the sigmoid
colon and rectum is similar to the prior study. The right perirectal
fistula has enlarged, measuring up to 2.2 cm transverse on image
[DATE], and demonstrates peripheral enhancement. The enteric contrast
has not yet passed through the distal colon. No evidence of bowel
obstruction or leak.

Vascular/Lymphatic: Several prominent mesenteric and retroperitoneal
lymph nodes are again noted, similar to the previous study. Largest
in the right mesentery measures 12 mm short axis on image 41/2.
There are mildly prominent perirectal lymph nodes as well. No
significant vascular findings. The portal, superior mesenteric and
splenic veins are patent.

Reproductive: The prostate gland and seminal vesicles appear normal.

Other: Stable postsurgical changes in the anterior abdominal wall.
No ascites or focal extraluminal fluid collections.

Musculoskeletal: No acute or significant osseous findings.
IMPRESSION: 1. Interval enlargement of right perirectal fistula. No evidence of
bowel obstruction or leak.
2. Stable postsurgical changes status post right hemicolectomy and
ileocolonic anastomosis. There is persistent wall thickening
throughout the sigmoid colon and rectum consistent with chronic
inflammation. Distal small bowel wall thickening appears mildly
improved.
3. Stable prominent mesenteric and retroperitoneal lymph nodes,
likely reactive.
4. Cholelithiasis without evidence of cholecystitis or biliary
dilatation.

## 2021-10-15 ENCOUNTER — Ambulatory Visit: Payer: BC Managed Care – PPO | Admitting: Dermatology

## 2022-03-17 ENCOUNTER — Ambulatory Visit: Payer: BC Managed Care – PPO | Admitting: Dermatology

## 2022-07-01 ENCOUNTER — Ambulatory Visit: Payer: BC Managed Care – PPO | Admitting: Dermatology

## 2022-07-01 VITALS — BP 120/69 | HR 82

## 2022-07-01 DIAGNOSIS — Z1283 Encounter for screening for malignant neoplasm of skin: Secondary | ICD-10-CM

## 2022-07-01 DIAGNOSIS — D229 Melanocytic nevi, unspecified: Secondary | ICD-10-CM

## 2022-07-01 DIAGNOSIS — L821 Other seborrheic keratosis: Secondary | ICD-10-CM | POA: Diagnosis not present

## 2022-07-01 DIAGNOSIS — L820 Inflamed seborrheic keratosis: Secondary | ICD-10-CM

## 2022-07-01 DIAGNOSIS — W908XXA Exposure to other nonionizing radiation, initial encounter: Secondary | ICD-10-CM

## 2022-07-01 DIAGNOSIS — L814 Other melanin hyperpigmentation: Secondary | ICD-10-CM

## 2022-07-01 DIAGNOSIS — L700 Acne vulgaris: Secondary | ICD-10-CM

## 2022-07-01 DIAGNOSIS — X32XXXA Exposure to sunlight, initial encounter: Secondary | ICD-10-CM

## 2022-07-01 DIAGNOSIS — Z79899 Other long term (current) drug therapy: Secondary | ICD-10-CM

## 2022-07-01 DIAGNOSIS — Z85828 Personal history of other malignant neoplasm of skin: Secondary | ICD-10-CM

## 2022-07-01 DIAGNOSIS — L578 Other skin changes due to chronic exposure to nonionizing radiation: Secondary | ICD-10-CM

## 2022-07-01 DIAGNOSIS — D1801 Hemangioma of skin and subcutaneous tissue: Secondary | ICD-10-CM

## 2022-07-01 DIAGNOSIS — D485 Neoplasm of uncertain behavior of skin: Secondary | ICD-10-CM

## 2022-07-01 DIAGNOSIS — Z8589 Personal history of malignant neoplasm of other organs and systems: Secondary | ICD-10-CM

## 2022-07-01 MED ORDER — KETOCONAZOLE 2 % EX SHAM: MEDICATED_SHAMPOO | CUTANEOUS | 3 refills | Status: DC

## 2022-07-01 MED ORDER — DOXYCYCLINE MONOHYDRATE 100 MG PO CAPS: 100.0000 mg | ORAL_CAPSULE | Freq: Every day | ORAL | 3 refills | Status: AC

## 2022-07-01 NOTE — Patient Instructions (Addendum)
Wound Care Instructions  Cleanse wound gently with soap and water once a day then pat dry with clean gauze. Apply a thin coat of Petrolatum (petroleum jelly, "Vaseline") over the wound (unless you have an allergy to this). We recommend that you use a new, sterile tube of Vaseline. Do not pick or remove scabs. Do not remove the yellow or white "healing tissue" from the base of the wound.  Cover the wound with fresh, clean, nonstick gauze and secure with paper tape. You may use Band-Aids in place of gauze and tape if the wound is small enough, but would recommend trimming much of the tape off as there is often too much. Sometimes Band-Aids can irritate the skin.  You should call the office for your biopsy report after 1 week if you have not already been contacted.  If you experience any problems, such as abnormal amounts of bleeding, swelling, significant bruising, significant pain, or evidence of infection, please call the office immediately.  FOR ADULT SURGERY PATIENTS: If you need something for pain relief you may take 1 extra strength Tylenol (acetaminophen) AND 2 Ibuprofen (200mg each) together every 4 hours as needed for pain. (do not take these if you are allergic to them or if you have a reason you should not take them.) Typically, you may only need pain medication for 1 to 3 days.     Due to recent changes in healthcare laws, you may see results of your pathology and/or laboratory studies on MyChart before the doctors have had a chance to review them. We understand that in some cases there may be results that are confusing or concerning to you. Please understand that not all results are received at the same time and often the doctors may need to interpret multiple results in order to provide you with the best plan of care or course of treatment. Therefore, we ask that you please give us 2 business days to thoroughly review all your results before contacting the office for clarification. Should  we see a critical lab result, you will be contacted sooner.   If You Need Anything After Your Visit  If you have any questions or concerns for your doctor, please call our main line at [phone] and press option 4 to reach your doctor's medical assistant. If no one answers, please leave a voicemail as directed and we will return your call as soon as possible. Messages left after 4 pm will be answered the following business day.   You may also send us a message via MyChart. We typically respond to MyChart messages within 1-2 business days.  For prescription refills, please ask your pharmacy to contact our office. Our fax number is [phone].  If you have an urgent issue when the clinic is closed that cannot wait until the next business day, you can page your doctor at the number below.    Please note that while we do our best to be available for urgent issues outside of office hours, we are not available 24/7.   If you have an urgent issue and are unable to reach us, you may choose to seek medical care at your doctor's office, retail clinic, urgent care center, or emergency room.  If you have a medical emergency, please immediately call 911 or go to the emergency department.  Pager Numbers  - Dr. Kowalski: [phone]  - Dr. Moye: [phone]  - Dr. Stewart: [phone]  In the event of inclement weather, please call our main line at   [phone] for an update on the status of any delays or closures.  Dermatology Medication Tips: Please keep the boxes that topical medications come in in order to help keep track of the instructions about where and how to use these. Pharmacies typically print the medication instructions only on the boxes and not directly on the medication tubes.   If your medication is too expensive, please contact our office at [phone] option 4 or send us a message through MyChart.   We are unable to tell what your co-pay for medications will be in  advance as this is different depending on your insurance coverage. However, we may be able to find a substitute medication at lower cost or fill out paperwork to get insurance to cover a needed medication.   If a prior authorization is required to get your medication covered by your insurance company, please allow us 1-2 business days to complete this process.  Drug prices often vary depending on where the prescription is filled and some pharmacies may offer cheaper prices.  The website www.goodrx.com contains coupons for medications through different pharmacies. The prices here do not account for what the cost may be with help from insurance (it may be cheaper with your insurance), but the website can give you the price if you did not use any insurance.  - You can print the associated coupon and take it with your prescription to the pharmacy.  - You may also stop by our office during regular business hours and pick up a GoodRx coupon card.  - If you need your prescription sent electronically to a different pharmacy, notify our office through [facility] MyChart or by phone at [phone] option 4.     Si Usted Necesita Algo Despus de Su Visita  Tambin puede enviarnos un mensaje a travs de MyChart. Por lo general respondemos a los mensajes de MyChart en el transcurso de 1 a 2 das hbiles.  Para renovar recetas, por favor pida a su farmacia que se ponga en contacto con nuestra oficina. Nuestro nmero de fax es el [phone].  Si tiene un asunto urgente cuando la clnica est cerrada y que no puede esperar hasta el siguiente da hbil, puede llamar/localizar a su doctor(a) al nmero que aparece a continuacin.   Por favor, tenga en cuenta que aunque hacemos todo lo posible para estar disponibles para asuntos urgentes fuera del horario de oficina, no estamos disponibles las 24 horas del da, los 7 das de la semana.   Si tiene un problema urgente y no puede comunicarse con nosotros, puede  optar por buscar atencin mdica  en el consultorio de su doctor(a), en una clnica privada, en un centro de atencin urgente o en una sala de emergencias.  Si tiene una emergencia mdica, por favor llame inmediatamente al 911 o vaya a la sala de emergencias.  Nmeros de bper  - Dr. Kowalski: [phone]  - Dra. Moye: [phone]  - Dra. Stewart: [phone]  En caso de inclemencias del tiempo, por favor llame a nuestra lnea principal al [phone] para una actualizacin sobre el estado de cualquier retraso o cierre.  Consejos para la medicacin en dermatologa: Por favor, guarde las cajas en las que vienen los medicamentos de uso tpico para ayudarle a seguir las instrucciones sobre dnde y cmo usarlos. Las farmacias generalmente imprimen las instrucciones del medicamento slo en las cajas y no directamente en los tubos del medicamento.   Si su medicamento es muy caro, por favor, pngase en contacto con   nuestra oficina llamando al [phone] y presione la opcin 4 o envenos un mensaje a travs de MyChart.   No podemos decirle cul ser su copago por los medicamentos por adelantado ya que esto es diferente dependiendo de la cobertura de su seguro. Sin embargo, es posible que podamos encontrar un medicamento sustituto a menor costo o llenar un formulario para que el seguro cubra el medicamento que se considera necesario.   Si se requiere una autorizacin previa para que su compaa de seguros cubra su medicamento, por favor permtanos de 1 a 2 das hbiles para completar este proceso.  Los precios de los medicamentos varan con frecuencia dependiendo del lugar de dnde se surte la receta y alguna farmacias pueden ofrecer precios ms baratos.  El sitio web www.goodrx.com tiene cupones para medicamentos de diferentes farmacias. Los precios aqu no tienen en cuenta lo que podra costar con la ayuda del seguro (puede ser ms barato con su seguro), pero el sitio web puede darle el  precio si no utiliz ningn seguro.  - Puede imprimir el cupn correspondiente y llevarlo con su receta a la farmacia.  - Tambin puede pasar por nuestra oficina durante el horario de atencin regular y recoger una tarjeta de cupones de GoodRx.  - Si necesita que su receta se enve electrnicamente a una farmacia diferente, informe a nuestra oficina a travs de MyChart de [place] o por telfono llamando al [phone] y presione la opcin 4.  

## 2022-07-01 NOTE — Progress Notes (Signed)
Follow-Up Visit   Subjective  Leroy Hernandez is a 37 y.o. male who presents for the following: Skin Cancer Screening and Full Body Skin Exam  The patient presents for Total-Body Skin Exam (TBSE) for skin cancer screening and mole check. The patient has spots, moles and lesions to be evaluated, some may be new or changing and the patient has concerns that these could be cancer.    The following portions of the chart were reviewed this encounter and updated as appropriate: medications, allergies, medical history  Review of Systems:  No other skin or systemic complaints except as noted in HPI or Assessment and Plan.  Objective  Well appearing patient in no apparent distress; mood and affect are within normal limits.  A full examination was performed including scalp, head, eyes, ears, nose, lips, neck, chest, axillae, abdomen, back, buttocks, bilateral upper extremities, bilateral lower extremities, hands, feet, fingers, toes, fingernails, and toenails. All findings within normal limits unless otherwise noted below.   Relevant physical exam findings are noted in the Assessment and Plan.  L side infra axillary Brown papule 0.7 cm    Assessment & Plan   LENTIGINES, SEBORRHEIC KERATOSES, HEMANGIOMAS - Benign normal skin lesions - Benign-appearing - Call for any changes  MELANOCYTIC NEVI - Tan-brown and/or pink-flesh-colored symmetric macules and papules - Benign appearing on exam today - Observation - Call clinic for new or changing moles - Recommend daily use of broad spectrum spf 30+ sunscreen to sun-exposed areas.   ACTINIC DAMAGE - Chronic condition, secondary to cumulative UV/sun exposure - diffuse scaly erythematous macules with underlying dyspigmentation - Recommend daily broad spectrum sunscreen SPF 30+ to sun-exposed areas, reapply every 2 hours as needed.  - Staying in the shade or wearing long sleeves, sun glasses (UVA+UVB protection) and wide brim hats (4-inch brim  around the entire circumference of the hat) are also recommended for sun protection.  - Call for new or changing lesions.  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  - Call if any new or changing lesions are noted between office visits  ACNE VULGARIS Exam: Papules on the chest, back, and face  Chronic and persistent condition with duration or expected duration over one year. Condition is bothersome/symptomatic for patient. Currently flared.   Treatment Plan: Start Doxycycline 100 mg po QD with dinner meal. Doxycycline should be taken with food to prevent nausea. Do not lay down for 30 minutes after taking. Be cautious with sun exposure and use good sun protection while on this medication. Pregnant women should not take this medication.   Start Ketoconazole 2% shampoo use 3d/wk. Let sit 5-10 minutes before washing off.   Neoplasm of uncertain behavior of skin L side infra axillary  Epidermal / dermal shaving  Lesion diameter (cm):  0.7 Informed consent: discussed and consent obtained   Timeout: patient name, date of birth, surgical site, and procedure verified   Procedure prep:  Patient was prepped and draped in usual sterile fashion Prep type:  Isopropyl alcohol Anesthesia: the lesion was anesthetized in a standard fashion   Anesthetic:  1% lidocaine w/ epinephrine 1-100,000 buffered w/ 8.4% NaHCO3 Instrument used: flexible razor blade   Hemostasis achieved with: pressure, aluminum chloride and electrodesiccation   Outcome: patient tolerated procedure well   Post-procedure  details: sterile dressing applied and wound care instructions given   Dressing type: bandage and petrolatum    Specimen 1 - Surgical pathology Differential Diagnosis: D48.5 irritated nevus vs  SK r/o dysplasia Check Margins: No  SKIN CANCER SCREENING PERFORMED TODAY.  Return in about 4 months (around 11/01/2022) for acne follow up .  Maylene Roes, CMA, am acting as scribe for Armida Sans, MD .  Documentation: I have reviewed the above documentation for accuracy and completeness, and I agree with the above.  Armida Sans, MD

## 2022-07-08 ENCOUNTER — Telehealth: Payer: Self-pay

## 2022-07-08 NOTE — Telephone Encounter (Signed)
-----   Message from Deirdre Evener, MD sent at 07/08/2022  1:08 PM EDT ----- Diagnosis Skin , left side infra axillary SEBORRHEIC KERATOSIS, IRRITATED  Benign irritated keratosis No further treatment needed

## 2022-07-08 NOTE — Telephone Encounter (Signed)
Left message on voicemail to return my call.  

## 2022-07-12 ENCOUNTER — Telehealth: Payer: Self-pay

## 2022-07-12 NOTE — Telephone Encounter (Signed)
Left message on voicemail to return my call.  

## 2022-07-12 NOTE — Telephone Encounter (Signed)
-----   Message from David C Kowalski, MD sent at 07/08/2022  1:08 PM EDT ----- Diagnosis Skin , left side infra axillary SEBORRHEIC KERATOSIS, IRRITATED  Benign irritated keratosis No further treatment needed 

## 2022-07-13 ENCOUNTER — Telehealth: Payer: Self-pay

## 2022-07-13 NOTE — Telephone Encounter (Signed)
Left voice mail to return my call. Letter sent.  ?

## 2022-07-13 NOTE — Telephone Encounter (Signed)
-----   Message from David C Kowalski, MD sent at 07/08/2022  1:08 PM EDT ----- Diagnosis Skin , left side infra axillary SEBORRHEIC KERATOSIS, IRRITATED  Benign irritated keratosis No further treatment needed 

## 2022-07-14 ENCOUNTER — Telehealth: Payer: Self-pay

## 2022-07-14 ENCOUNTER — Encounter: Payer: Self-pay | Admitting: Dermatology

## 2022-07-14 NOTE — Telephone Encounter (Signed)
Patient informed of pathology results 

## 2022-11-03 ENCOUNTER — Ambulatory Visit: Payer: BC Managed Care – PPO | Admitting: Dermatology

## 2022-11-03 ENCOUNTER — Encounter: Payer: Self-pay | Admitting: Dermatology

## 2022-11-03 DIAGNOSIS — L700 Acne vulgaris: Secondary | ICD-10-CM | POA: Diagnosis not present

## 2022-11-03 DIAGNOSIS — L649 Androgenic alopecia, unspecified: Secondary | ICD-10-CM | POA: Diagnosis not present

## 2022-11-03 DIAGNOSIS — Z7189 Other specified counseling: Secondary | ICD-10-CM

## 2022-11-03 DIAGNOSIS — L739 Follicular disorder, unspecified: Secondary | ICD-10-CM

## 2022-11-03 DIAGNOSIS — Z79899 Other long term (current) drug therapy: Secondary | ICD-10-CM | POA: Diagnosis not present

## 2022-11-03 MED ORDER — KETOCONAZOLE 2 % EX SHAM: MEDICATED_SHAMPOO | CUTANEOUS | 5 refills | Status: DC

## 2022-11-03 MED ORDER — MINOXIDIL 2.5 MG PO TABS: 2.5000 mg | ORAL_TABLET | Freq: Every day | ORAL | 1 refills | Status: DC

## 2022-11-03 NOTE — Progress Notes (Signed)
   Follow-Up Visit   Subjective  Leroy Hernandez is a 37 y.o. male who presents for the following: hair loss, patient has tried over the counter medications, but hasn't noticed an improvement. He would like to discuss treatment options before he losses more hair. Patient states that acne has improved, and he is no longer using Doxycycline or Ketoconazole, but he does still flare on his chest, back, and legs.   The patient has spots, moles and lesions to be evaluated, some may be new or changing and the patient may have concern these could be cancer.   The following portions of the chart were reviewed this encounter and updated as appropriate: medications, allergies, medical history  Review of Systems:  No other skin or systemic complaints except as noted in HPI or Assessment and Plan.  Objective  Well appearing patient in no apparent distress; mood and affect are within normal limits.   A focused examination was performed of the following areas:   Relevant exam findings are noted in the Assessment and Plan.    Assessment & Plan               ANDROGENETIC ALOPECIA (MALE PATTERN HAIR LOSS) -  no heart or circulatory issues per patient Exam: Frontal scalp thinning with intact frontal hairline and miniaturization   Chronic and persistent condition with duration or expected duration over one year. Condition is symptomatic/ bothersome to patient. Not currently at goal.  Androgenetic Alopecia (or Male pattern hair loss) refers to the common patterned hair loss affecting many men.  Male pattern alopecia is mediated by dihydrotestosterone which induces miniaturization of androgen-sensitive hair follicles.  It is chronic and persistent, but treatable; not curable. Topical treatment includes: - 5% topical Minoxidil Oral treatment includes: - Finasteride 1 mg qd - Minoxidil 1.25 - 5 mg qd - Dutasteride 0.5 mg qd Adjunct therapy includes: - Low Level Laser Light Therapy (LLLT) -  Platelet-rich Plasma injections (PRP) - Hair Transplantation or scalp reduction  Treatment Plan: Start Minoxidil 2.5 mg po QD and  Biotin 2.5 mg po QD.   Long term medication management.  Patient is using long term (months to years) prescription medication  to control their dermatologic condition.  These medications require periodic monitoring to evaluate for efficacy and side effects and may require periodic laboratory monitoring.    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 Condition is bothersome/symptomatic for patient. Currently flared.  Treatment Plan: Restart Ketoconazole 2% shampoo use 3d/wk. Let sit 5-10 minutes before washing off.   Return in about 8 months (around 07/03/2023) for TBSE and androgenic alopecia follow up .  Maylene Roes, CMA, am acting as scribe for Armida Sans, MD .  Documentation: I have reviewed the above documentation for accuracy and completeness, and I agree with the above.  Armida Sans, MD

## 2022-11-03 NOTE — Patient Instructions (Addendum)
Start over the counter Biotin at least 2.5 mg daily (follow instruction on bottle).  Start Minoxidil 2.5 mg by mouth once daily. Doses of minoxidil for hair loss are considered 'low dose'. This is because the doses used for hair loss are much lower than the doses which are used for conditions such as high blood pressure (hypertension). The doses used for hypertension are 10-40mg  per day.  Side effects are uncommon at the low doses (up to 2.5 mg/day) used to treat hair loss. Potential side effects, more commonly seen at higher doses, include: Increase in hair growth (hypertrichosis) elsewhere on face and body Temporary hair shedding upon starting medication which may last up to 4 weeks Ankle swelling, fluid retention, rapid weight gain more than 5 pounds Low blood pressure and feeling lightheaded or dizzy when standing up quickly Fast or irregular heartbeat Headaches Restart Ketoconazole 2% shampoo to the face, chest, and shoulders once daily 3d/wk.   Due to recent changes in healthcare laws, you may see results of your pathology and/or laboratory studies on MyChart before the doctors have had a chance to review them. We understand that in some cases there may be results that are confusing or concerning to you. Please understand that not all results are received at the same time and often the doctors may need to interpret multiple results in order to provide you with the best plan of care or course of treatment. Therefore, we ask that you please give [place] 2 business days to thoroughly review all your results before contacting the office for clarification. Should we see a critical lab result, you will be contacted sooner.   If You Need Anything After Your Visit  If you have any questions or concerns for your doctor, please call our main line at [phone] and press option 4 to reach your doctor's medical assistant. If no one answers, please leave a voicemail as directed and we will return your call as  soon as possible. Messages left after 4 pm will be answered the following business day.   You may also send [place] a message via MyChart. We typically respond to MyChart messages within 1-2 business days.  For prescription refills, please ask your pharmacy to contact our office. Our fax number is [phone].  If you have an urgent issue when the clinic is closed that cannot wait until the next business day, you can page your doctor at the number below.    Please note that while we do our best to be available for urgent issues outside of office hours, we are not available 24/7.   If you have an urgent issue and are unable to reach [place], you may choose to seek medical care at your doctor's office, retail clinic, urgent care center, or emergency room.  If you have a medical emergency, please immediately call 911 or go to the emergency department.  Pager Numbers  - Dr. Gwen Pounds: [phone]  - Dr. Roseanne Reno: [phone]  - Dr. Katrinka Blazing: [phone]   In the event of inclement weather, please call our main line at [phone] for an update on the status of any delays or closures.  Dermatology Medication Tips: Please keep the boxes that topical medications come in in order to help keep track of the instructions about where and how to use these. Pharmacies typically print the medication instructions only on the boxes and not directly on the medication tubes.   If your medication is too expensive, please contact our office at [phone] option 4 or  send [place] a message through MyChart.   We are unable to tell what your co-pay for medications will be in advance as this is different depending on your insurance coverage. However, we may be able to find a substitute medication at lower cost or fill out paperwork to get insurance to cover a needed medication.   If a prior authorization is required to get your medication covered by your insurance company, please allow [place] 1-2 business days to complete this  process.  Drug prices often vary depending on where the prescription is filled and some pharmacies may offer cheaper prices.  The website www.goodrx.com contains coupons for medications through different pharmacies. The prices here do not account for what the cost may be with help from insurance (it may be cheaper with your insurance), but the website can give you the price if you did not use any insurance.  - You can print the associated coupon and take it with your prescription to the pharmacy.  - You may also stop by our office during regular business hours and pick up a GoodRx coupon card.  - If you need your prescription sent electronically to a different pharmacy, notify our office through [facility] or by phone at [phone] option 4.     Si Usted Necesita Algo Despus de Su Visita  Tambin puede enviarnos un mensaje a travs de Clinical cytogeneticist. Por lo general respondemos a los mensajes de MyChart en el transcurso de 1 a 2 das hbiles.  Para renovar recetas, por favor pida a su farmacia que se ponga en contacto con nuestra oficina. Annie Sable de fax es [place] [phone].  Si tiene un asunto urgente cuando la clnica est cerrada y que no puede esperar hasta el siguiente da hbil, puede llamar/localizar a su doctor(a) al nmero que aparece a continuacin.   Por favor, tenga en cuenta que aunque hacemos todo lo posible para estar disponibles para asuntos urgentes fuera del horario de [place], no estamos disponibles las 24 horas del da, los 7 [address]  [address] de la [place].   Si tiene un problema urgente y no puede comunicarse con nosotros, puede optar por buscar atencin mdica  en el consultorio de su doctor(a), en una clnica privada, en un centro de atencin urgente o en una sala de emergencias.  Si tiene Engineer, drilling, por favor llame inmediatamente al 911 o vaya a la sala de emergencias.  Nmeros de bper  - Dr. Gwen Pounds: [phone]  - Dra. Roseanne Reno: [phone]  - Dr.  Katrinka Blazing: [phone]   En caso de inclemencias del tiempo, por favor llame a Lacy Duverney principal al [phone] para una actualizacin sobre el [place] de cualquier retraso o cierre.  Consejos para la medicacin en dermatologa: Por favor, guarde las cajas en las que vienen los medicamentos de uso tpico para ayudarle a seguir las instrucciones sobre dnde y cmo usarlos. Las farmacias generalmente imprimen las instrucciones del medicamento slo en las cajas y no directamente en los tubos del [place].   Si su medicamento es muy caro, por favor, pngase en contacto con Rolm Gala llamando al [phone] y presione la opcin 4 o envenos un mensaje a travs de Clinical cytogeneticist.   No podemos decirle cul ser su copago por los medicamentos por adelantado ya que esto es diferente dependiendo de la cobertura de su seguro. Sin embargo, es posible que podamos encontrar un medicamento sustituto a Audiological scientist un formulario para que el seguro cubra el medicamento que se considera necesario.  Si se requiere una autorizacin previa para que su compaa de seguros [place] su medicamento, por favor permtanos de 1 a 2 das hbiles para completar [address]  Los precios de los medicamentos varan con frecuencia dependiendo del Environmental consultant de dnde se surte la receta y alguna farmacias pueden ofrecer precios ms baratos.  El sitio web www.goodrx.com tiene cupones para medicamentos de Health and safety inspector. Los precios aqu no tienen en cuenta lo que podra costar con la ayuda del seguro (puede ser ms barato con su seguro), pero el sitio web puede darle el precio si no utiliz Tourist information centre manager.  - Puede imprimir el cupn correspondiente y llevarlo con su receta a la farmacia.  - Tambin puede pasar por nuestra oficina durante el horario de atencin regular y Education officer, museum una tarjeta de cupones de GoodRx.  - Si necesita que su receta se enve electrnicamente a una farmacia diferente, informe a nuestra oficina a  travs de MyChart de [place] o por telfono llamando al [phone] y presione la opcin 4.

## 2023-02-07 ENCOUNTER — Encounter: Payer: Self-pay | Admitting: Oncology

## 2023-04-27 ENCOUNTER — Other Ambulatory Visit: Payer: Self-pay | Admitting: Dermatology

## 2023-06-08 NOTE — Telephone Encounter (Signed)
 Please give me a call I have Question about Hpv and its contact to others

## 2023-07-05 ENCOUNTER — Ambulatory Visit: Admitting: Dermatology

## 2023-07-05 ENCOUNTER — Encounter: Payer: Self-pay | Admitting: Oncology

## 2023-07-06 ENCOUNTER — Ambulatory Visit: Payer: BC Managed Care – PPO | Admitting: Dermatology

## 2023-07-26 ENCOUNTER — Ambulatory Visit: Admitting: Dermatology

## 2023-07-26 ENCOUNTER — Encounter: Payer: Self-pay | Admitting: Dermatology

## 2023-07-26 DIAGNOSIS — D229 Melanocytic nevi, unspecified: Secondary | ICD-10-CM

## 2023-07-26 DIAGNOSIS — Z1283 Encounter for screening for malignant neoplasm of skin: Secondary | ICD-10-CM | POA: Diagnosis not present

## 2023-07-26 DIAGNOSIS — Z7189 Other specified counseling: Secondary | ICD-10-CM

## 2023-07-26 DIAGNOSIS — L739 Follicular disorder, unspecified: Secondary | ICD-10-CM

## 2023-07-26 DIAGNOSIS — L814 Other melanin hyperpigmentation: Secondary | ICD-10-CM | POA: Diagnosis not present

## 2023-07-26 DIAGNOSIS — L570 Actinic keratosis: Secondary | ICD-10-CM

## 2023-07-26 DIAGNOSIS — L578 Other skin changes due to chronic exposure to nonionizing radiation: Secondary | ICD-10-CM

## 2023-07-26 DIAGNOSIS — L649 Androgenic alopecia, unspecified: Secondary | ICD-10-CM

## 2023-07-26 DIAGNOSIS — L700 Acne vulgaris: Secondary | ICD-10-CM

## 2023-07-26 DIAGNOSIS — W908XXA Exposure to other nonionizing radiation, initial encounter: Secondary | ICD-10-CM

## 2023-07-26 DIAGNOSIS — Z85828 Personal history of other malignant neoplasm of skin: Secondary | ICD-10-CM

## 2023-07-26 DIAGNOSIS — Z79899 Other long term (current) drug therapy: Secondary | ICD-10-CM

## 2023-07-26 MED ORDER — KETOCONAZOLE 2 % EX SHAM
MEDICATED_SHAMPOO | CUTANEOUS | 5 refills | Status: AC
Start: 2023-07-26 — End: ?

## 2023-07-26 MED ORDER — MINOXIDIL 2.5 MG PO TABS: 2.5000 mg | ORAL_TABLET | Freq: Every day | ORAL | 1 refills | Status: AC

## 2023-07-26 NOTE — Patient Instructions (Addendum)
 Recommend over the counter Hibiclens wash daily in the shower. Let sit 5 minutes before washing off.   Due to recent changes in healthcare laws, you may see results of your pathology and/or laboratory studies on MyChart before the doctors have had a chance to review them. We understand that in some cases there may be results that are confusing or concerning to you. Please understand that not all results are received at the same time and often the doctors may need to interpret multiple results in order to provide you with the best plan of care or course of treatment. Therefore, we ask that you please give us  2 business days to thoroughly review all your results before contacting the office for clarification. Should we see a critical lab result, you will be contacted sooner.   If You Need Anything After Your Visit  If you have any questions or concerns for your doctor, please call our main line at [phone] and press option 4 to reach your doctor's medical assistant. If no one answers, please leave a voicemail as directed and we will return your call as soon as possible. Messages left after 4 pm will be answered the following business day.   You may also send us  a message via MyChart. We typically respond to MyChart messages within 1-2 business days.  For prescription refills, please ask your pharmacy to contact our office. Our fax number is [phone].  If you have an urgent issue when the clinic is closed that cannot wait until the next business day, you can page your doctor at the number below.    Please note that while we do our best to be available for urgent issues outside of office hours, we are not available 24/7.   If you have an urgent issue and are unable to reach us , you may choose to seek medical care at your doctor's office, retail clinic, urgent care center, or emergency room.  If you have a medical emergency, please immediately call 911 or go to the emergency  department.  Pager Numbers  - Dr. Bary Likes: [phone]  - Dr. Annette Barters: [phone]  - Dr. Felipe Horton: [phone]   In the event of inclement weather, please call our main line at [phone] for an update on the status of any delays or closures.  Dermatology Medication Tips: Please keep the boxes that topical medications come in in order to help keep track of the instructions about where and how to use these. Pharmacies typically print the medication instructions only on the boxes and not directly on the medication tubes.   If your medication is too expensive, please contact our office at [phone] option 4 or send us  a message through MyChart.   We are unable to tell what your co-pay for medications will be in advance as this is different depending on your insurance coverage. However, we may be able to find a substitute medication at lower cost or fill out paperwork to get insurance to cover a needed medication.   If a prior authorization is required to get your medication covered by your insurance company, please allow us  1-2 business days to complete this process.  Drug prices often vary depending on where the prescription is filled and some pharmacies may offer cheaper prices.  The website www.goodrx.com contains coupons for medications through different pharmacies. The prices here do not account for what the cost may be with help from insurance (it may be cheaper with your insurance), but the website can give  you the price if you did not use any insurance.  - You can print the associated coupon and take it with your prescription to the pharmacy.  - You may also stop by our office during regular business hours and pick up a GoodRx coupon card.  - If you need your prescription sent electronically to a different pharmacy, notify our office through [facility] or by phone at [phone] option 4.     Si Usted Necesita Algo Despus de Su Visita  Tambin puede enviarnos  un mensaje a travs de Clinical cytogeneticist. Por lo general respondemos a los mensajes de MyChart en el transcurso de 1 a 2 das hbiles.  Para renovar recetas, por favor pida a su farmacia que se ponga en contacto con nuestra oficina. Franz Jacks de fax es [place] [phone].  Si tiene un asunto urgente cuando la clnica est cerrada y que no puede esperar hasta el siguiente da hbil, puede llamar/localizar a su doctor(a) al nmero que aparece a continuacin.   Por favor, tenga en cuenta que aunque hacemos todo lo posible para estar disponibles para asuntos urgentes fuera del horario de [place], no estamos disponibles las 24 horas del da, los 7 [address]  [address] de la [place].   Si tiene un problema urgente y no puede comunicarse con nosotros, puede optar por buscar atencin mdica  en el consultorio de su doctor(a), en una clnica privada, en un centro de atencin urgente o en una sala de emergencias.  Si tiene Engineer, drilling, por favor llame inmediatamente al 911 o vaya a la sala de emergencias.  Nmeros de bper  - Dr. Bary Likes: [phone]  - Dra. Annette Barters: [phone]  - Dr. Felipe Horton: [phone]   En caso de inclemencias del tiempo, por favor llame a Lajuan Pila principal al [phone] para una actualizacin sobre el [place] de cualquier retraso o cierre.  Consejos para la medicacin en dermatologa: Por favor, guarde las cajas en las que vienen los medicamentos de uso tpico para ayudarle a seguir las instrucciones sobre dnde y cmo usarlos. Las farmacias generalmente imprimen las instrucciones del medicamento slo en las cajas y no directamente en los tubos del [place].   Si su medicamento es muy caro, por favor, pngase en contacto con Bettyjane Brunet llamando al [phone] y presione la opcin 4 o envenos un mensaje a travs de Clinical cytogeneticist.   No podemos decirle cul ser su copago por los medicamentos por adelantado ya que esto es diferente dependiendo de la cobertura de su seguro. Sin  embargo, es posible que podamos encontrar un medicamento sustituto a Audiological scientist un formulario para que el seguro cubra el medicamento que se considera necesario.   Si se requiere una autorizacin previa para que su compaa de seguros [place] su medicamento, por favor permtanos de 1 a 2 das hbiles para completar este proceso.  Los precios de los medicamentos varan con frecuencia dependiendo del Environmental consultant de dnde se surte la receta y alguna farmacias pueden ofrecer precios ms baratos.  El sitio web www.goodrx.com tiene cupones para medicamentos de Health and safety inspector. Los precios aqu no tienen en cuenta lo que podra costar con la ayuda del seguro (puede ser ms barato con su seguro), pero el sitio web puede darle el precio si no utiliz Tourist information centre manager.  - Puede imprimir el cupn correspondiente y llevarlo con su receta a la farmacia.  - Tambin puede pasar por nuestra oficina durante el horario de atencin regular y Education officer, museum una tarjeta de cupones de GoodRx.  -  Si necesita que su receta se enve electrnicamente a Psychiatrist, informe a nuestra oficina a travs de MyChart de [place] o por telfono llamando al [phone] y presione la opcin 4.

## 2023-07-26 NOTE — Progress Notes (Signed)
 Follow-Up Visit   Subjective  Leroy Hernandez is a 38 y.o. male who presents for the following: Skin Cancer Screening and Full Body Skin Exam  The patient presents for Total-Body Skin Exam (TBSE) for skin cancer screening and mole check. The patient has spots, moles and lesions to be evaluated, some may be new or changing.  Pt concerned about lesion on R hand. Discuss androgenetic alopecia, pt currently taking Minoxidil  2.5 mg po QD with no s/e.  The following portions of the chart were reviewed this encounter and updated as appropriate: medications, allergies, medical history  Review of Systems:  No other skin or systemic complaints except as noted in HPI or Assessment and Plan.  Objective  Well appearing patient in no apparent distress; mood and affect are within normal limits.  A full examination was performed including scalp, head, eyes, ears, nose, lips, neck, chest, axillae, abdomen, back, buttocks, bilateral upper extremities, bilateral lower extremities, hands, feet, fingers, toes, fingernails, and toenails. All findings within normal limits unless otherwise noted below.   Relevant physical exam findings are noted in the Assessment and Plan.  dorsal hands Erythematous thin papules/macules with gritty scale.   Assessment & Plan   SKIN CANCER SCREENING PERFORMED TODAY.  ACTINIC DAMAGE - Chronic condition, secondary to cumulative UV/sun exposure - diffuse scaly erythematous macules with underlying dyspigmentation - Recommend daily broad spectrum sunscreen SPF 30+ to sun-exposed areas, reapply every 2 hours as needed.  - Staying in the shade or wearing long sleeves, sun glasses (UVA+UVB protection) and wide brim hats (4-inch brim around the entire circumference of the hat) are also recommended for sun protection.  - Call for new or changing lesions.  LENTIGINES, Benign normal skin lesions - Benign-appearing - Call for any changes  MELANOCYTIC NEVI - Tan-brown and/or  pink-flesh-colored symmetric macules and papules - Benign appearing on exam today - Observation - Call clinic for new or changing moles - Recommend daily use of broad spectrum spf 30+ sunscreen to sun-exposed areas.   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 Condition is symptomatic/ bothersome to patient. Not currently at goal.   Androgenetic Alopecia (or Male pattern hair loss) refers to the common patterned hair loss affecting many men.  Male pattern alopecia is mediated by dihydrotestosterone which induces miniaturization of androgen-sensitive hair follicles.  It is chronic and persistent, but treatable; not curable. Oral treatment includes: - Finasteride 1 mg every day, risk depression suicidality sexual dysfunction erectile dysfunction anorgasmia - Dutasteride 0.5 mg qd Adjunct therapy includes: - Hair Transplantation or scalp reduction   Treatment Plan: Continue Minoxidil  2.5 mg po QD. May discontinue Biotin as studies have suggested that it may not help with hair growth, and can cause inaccurate test results for heart attacks and thyroid . Discussed finasteride and potential side effects.   Doses of oral minoxidil  for hair loss are considered 'low dose'. This is because the doses used for hair loss are much lower than the doses which are used for conditions such as high blood pressure (hypertension). The doses used for hypertension are 10-40mg  per day.  Side effects are uncommon at the low doses (up to 2.5 mg/day) used to treat hair loss. Potential side effects, more commonly seen at higher doses, include: Increase in hair growth (hypertrichosis) elsewhere on face and  body Temporary hair shedding upon starting medication which may last up to 4  weeks Ankle swelling, fluid retention, rapid weight gain more than 5 pounds Low blood pressure and feeling lightheaded or dizzy when standing up quickly Fast or irregular heartbeat Headaches   Long term medication management.  Patient is using long term (months to years) prescription medication  to control their dermatologic condition.  These medications require periodic monitoring to evaluate for efficacy and side effects and may require periodic laboratory monitoring.    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 Condition is bothersome/symptomatic for patient. Currently flared.   Treatment Plan: Restart Ketoconazole  2% shampoo use 3d/wk. Let sit 5-10 minutes before washing off.   Start Hibiclens wash over the counter. Let sit a few minutes before washing off.   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  - Call if any new or changing lesions are noted between office visits AK (ACTINIC KERATOSIS) dorsal hands Actinic keratoses are precancerous spots that appear secondary to cumulative UV radiation exposure/sun exposure over time. They are chronic with expected duration over 1 year. A portion of actinic keratoses will progress to squamous cell carcinoma of the skin. It is not possible to reliably predict which spots will progress to skin cancer and so treatment is recommended to prevent development of skin cancer.  Recommend daily broad spectrum sunscreen SPF 30+ to sun-exposed areas, reapply every 2 hours as needed.  Recommend staying in the shade or wearing long sleeves, sun glasses  (UVA+UVB protection) and wide brim hats (4-inch brim around the entire circumference of the hat). Call for new or changing lesions.  Patient going on vacation in a few weeks and defers tx today.  MULTIPLE BENIGN NEVI   LENTIGINES   ACTINIC ELASTOSIS   ANDROGENETIC ALOPECIA   MEDICATION MANAGEMENT   COUNSELING AND COORDINATION OF CARE   ACNE VULGARIS   Related Medications Adapalene -Benzoyl Peroxide  (EPIDUO  FORTE) 0.3-2.5 % GEL Apply 1 application topically at bedtime. Qhs to back, shoulders, chest for acne  Return in about 1 year (around 07/25/2024) for TBSE - hx SCC.  Arlinda Lais, CMA, am acting as scribe for Harris Liming, MD .   Documentation: I have reviewed the above documentation for accuracy and completeness, and I agree with the above.  Harris Liming, MD

## 2023-08-30 ENCOUNTER — Ambulatory Visit: Admitting: Dermatology

## 2023-12-20 ENCOUNTER — Other Ambulatory Visit: Payer: Self-pay

## 2023-12-20 ENCOUNTER — Emergency Department (HOSPITAL_COMMUNITY)

## 2023-12-20 ENCOUNTER — Encounter (HOSPITAL_COMMUNITY): Payer: Self-pay | Admitting: *Deleted

## 2023-12-20 ENCOUNTER — Emergency Department (HOSPITAL_COMMUNITY)
Admission: EM | Admit: 2023-12-20 | Discharge: 2023-12-20 | Disposition: A | Discharge status: Home / Self Care | Attending: Emergency Medicine | Admitting: Emergency Medicine

## 2023-12-20 DIAGNOSIS — Z7721 Contact with and (suspected) exposure to potentially hazardous body fluids: Secondary | ICD-10-CM | POA: Diagnosis not present

## 2023-12-20 DIAGNOSIS — S60511A Abrasion of right hand, initial encounter: Secondary | ICD-10-CM | POA: Insufficient documentation

## 2023-12-20 DIAGNOSIS — S8391XA Sprain of unspecified site of right knee, initial encounter: Secondary | ICD-10-CM | POA: Diagnosis not present

## 2023-12-20 DIAGNOSIS — Y990 Civilian activity done for income or pay: Secondary | ICD-10-CM | POA: Diagnosis not present

## 2023-12-20 DIAGNOSIS — M25561 Pain in right knee: Secondary | ICD-10-CM | POA: Diagnosis present

## 2023-12-20 LAB — RAPID HIV SCREEN (HIV 1/2 AB+AG)
HIV 1/2 Antibodies: NONREACTIVE
HIV-1 P24 Antigen - HIV24: NONREACTIVE

## 2023-12-20 LAB — COMPREHENSIVE METABOLIC PANEL WITH GFR
ALT: 14 U/L (ref 0–44)
AST: 19 U/L (ref 15–41)
Albumin: 4.4 g/dL (ref 3.5–5.0)
Alkaline Phosphatase: 109 U/L (ref 38–126)
Anion gap: 11 (ref 5–15)
BUN: 8 mg/dL (ref 6–20)
CO2: 25 mmol/L (ref 22–32)
Calcium: 8.8 mg/dL — ABNORMAL LOW (ref 8.9–10.3)
Chloride: 105 mmol/L (ref 98–111)
Creatinine, Ser: 0.9 mg/dL (ref 0.61–1.24)
GFR, Estimated: >60 mL/min (ref >60)
Glucose, Bld: 94 mg/dL (ref 70–99)
Potassium: 4.2 mmol/L (ref 3.5–5.1)
Sodium: 142 mmol/L (ref 135–145)
Total Bilirubin: 0.8 mg/dL (ref 0.0–1.2)
Total Protein: 7.1 g/dL (ref 6.5–8.1)

## 2023-12-20 MED ORDER — BICTEGRAVIR-EMTRICITAB-TENOFOV 50-200-25 MG PO TABS
1.0000 | ORAL_TABLET | Freq: Every day | ORAL | Status: DC
  Administered 2023-12-20: 1 via ORAL
  Filled 2023-12-20 (×2): qty 1

## 2023-12-20 MED ORDER — BICTEGRAVIR-EMTRICITAB-TENOFOV 30-120-15 MG PO TABS: 1.0000 | ORAL_TABLET | Freq: Every day | ORAL | 0 refills | Status: AC
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
## 2023-12-21 LAB — HEPATITIS PANEL, ACUTE
HCV Ab: NONREACTIVE
Hep A IgM: NONREACTIVE
Hep B C IgM: NONREACTIVE
Hepatitis B Surface Ag: NONREACTIVE

## 2023-12-27 NOTE — Telephone Encounter (Signed)
 Encounter for refill request: Last clinic visit: 11/14/2023 Colonoscopy:   Lab Results  Component Value Date   WBC 6.5 10/10/2023   RBC 5.60 10/10/2023   HGB 15.8 10/10/2023   HCT 45.6 10/10/2023   PLT 219 10/10/2023   ALT 12 10/10/2023   AST 16 10/10/2023   ALKPHOS 105 10/10/2023   CRP <5.0 10/10/2023   CREATININE 0.89 10/10/2023    Skyrizi refills authorized x 6 month supply.

## 2024-01-03 ENCOUNTER — Emergency Department (HOSPITAL_COMMUNITY)
Admission: EM | Admit: 2024-01-03 | Discharge: 2024-01-03 | Disposition: A | Discharge status: Home / Self Care | Payer: Worker's Compensation | Attending: Emergency Medicine | Admitting: Emergency Medicine

## 2024-01-03 ENCOUNTER — Other Ambulatory Visit: Payer: Self-pay

## 2024-01-03 ENCOUNTER — Encounter (HOSPITAL_COMMUNITY): Payer: Self-pay

## 2024-01-03 ENCOUNTER — Encounter: Payer: Self-pay | Admitting: Oncology

## 2024-01-03 DIAGNOSIS — Z09 Encounter for follow-up examination after completed treatment for conditions other than malignant neoplasm: Secondary | ICD-10-CM | POA: Insufficient documentation

## 2024-01-03 DIAGNOSIS — M25561 Pain in right knee: Secondary | ICD-10-CM | POA: Insufficient documentation

## 2024-01-03 DIAGNOSIS — Z79899 Other long term (current) drug therapy: Secondary | ICD-10-CM | POA: Insufficient documentation

## 2024-01-03 DIAGNOSIS — Z91010 Allergy to peanuts: Secondary | ICD-10-CM | POA: Insufficient documentation

## 2024-01-03 DIAGNOSIS — K5090 Crohn's disease, unspecified, without complications: Secondary | ICD-10-CM | POA: Diagnosis not present

## 2024-01-03 NOTE — Discharge Instructions (Signed)
 I would recommend that you follow-up with your primary care doctor for recheck of symptoms and clearance to return to work.

## 2024-01-03 NOTE — ED Provider Notes (Signed)
 [place] EMERGENCY DEPARTMENT AT [facility] Provider Note   CSN: [id number] Arrival date & time: 01/03/24  1326     Patient presents with: Follow-up   Leroy Hernandez is a 38 y.o. male patient with past medical history of Crohn's disease presents to emergency room with complaint of follow-up.  Patient is wondering if he can be cleared for work through [employer]. knee pain has essentially resolved and he is ambulating well.  He is also wondering if he should continue taking Biktarvy /if he needs any follow-up labs. No additional complaints, otherwise doing well. He has not missed doses of medications.   HPI     Prior to Admission medications   Medication Sig Start Date End Date Taking? Authorizing Provider  Adapalene -Benzoyl Peroxide  (EPIDUO  FORTE) 0.3-2.5 % GEL Apply 1 application topically at bedtime. Qhs to back, shoulders, chest for acne Patient not taking: Reported on 07/26/2023 10/09/20   Hester Alm BROCKS, MD  ARIPiprazole  (ABILIFY ) 5 MG tablet Take 5 mg by mouth daily. Patient not taking: Reported on 07/26/2023 12/15/18   [provider]  Bictegravir-Emtricitab-Tenofov 30-120-15 MG TABS Take 1 tablet by mouth daily at 12 noon for 28 days. 12/20/23 01/17/24  Suellen Cantor A, PA-C  citalopram (CELEXA) 20 MG tablet Take 20 mg by mouth every morning. Patient not taking: Reported on 07/26/2023 12/23/20   [provider]  divalproex (DEPAKOTE ER) 500 MG 24 hr tablet Take 1,000 mg by mouth at bedtime. Patient not taking: Reported on 07/26/2023 08/03/19   [provider]  doxycycline  (MONODOX ) 100 MG capsule Take 1 capsule (100 mg total) by mouth at bedtime. Take one tab po QHS with dinner meal. Patient not taking: Reported on 07/26/2023 07/01/22   Hester Alm BROCKS, MD  escitalopram  (LEXAPRO ) 10 MG tablet Take 1 tablet (10 mg total) by mouth daily. Patient not taking: Reported on 07/26/2023 02/20/18   Eappen, Saramma, MD  hydrOXYzine  (VISTARIL ) 25 MG  capsule Take 1 capsule (25 mg total) by mouth daily as needed (severe anxiety attacks). Patient not taking: Reported on 07/26/2023 07/15/17   Eappen, Saramma, MD  ketoconazole  (NIZORAL ) 2 % shampoo Shampoo onto body let sit 10 minutes then wash off. Use 3d/wk. 07/26/23   Claudene Lehmann, MD  LAGEVRIO 200 MG CAPS capsule Take 4 capsules by mouth 2 (two) times daily. Patient not taking: Reported on 07/26/2023 02/15/21   [provider]  loperamide  (IMODIUM ) 2 MG capsule Take by mouth 4 (four) times daily as needed. Patient not taking: Reported on 07/26/2023 03/20/19   [provider]  meloxicam (MOBIC) 7.5 MG tablet Take 7.5 mg by mouth daily as needed. Patient not taking: Reported on 07/26/2023 01/29/21   [provider]  minoxidil  (LONITEN ) 2.5 MG tablet Take 1 tablet (2.5 mg total) by mouth daily. 07/26/23   Claudene Lehmann, MD  ondansetron  (ZOFRAN  ODT) 4 MG disintegrating tablet Take 1 tablet (4 mg total) by mouth every 8 (eight) hours as needed. Patient not taking: Reported on 07/26/2023 12/25/20   Edelmiro Leash, MD  predniSONE  (DELTASONE ) 50 MG tablet Take 1 tablet (50 mg total) by mouth daily with breakfast. Patient not taking: Reported on 07/26/2023 03/22/21   Kinner, Robert, MD  promethazine -dextromethorphan (PROMETHAZINE -DM) 6.25-15 MG/5ML syrup Take 5 mLs by mouth every 6 (six) hours as needed. Patient not taking: Reported on 07/26/2023 02/15/21   [provider]  Risankizumab-rzaa (SKYRIZI PEN [place]) Inject 1 Pen into the skin every 3 (three) months.    [provider]  STELARA 90 MG/ML SOSY injection Inject 90 mg into the skin every 8 (eight) weeks.  Patient not taking: Reported on 07/26/2023 09/19/19   [provider]    Allergies: Peanut oil and Vancomycin     Review of Systems  Constitutional:  Positive for activity change.    Updated Vital Signs BP 113/84 (BP Location: Right Arm)   Pulse 76   Temp 98.5 F (36.9 C) (Oral)   Resp  18   Ht 5' 5 (1.651 m)   Wt 63.5 kg   SpO2 100%   BMI 23.30 kg/m   Physical Exam Vitals and nursing note reviewed.  Constitutional:      General: He is not in acute distress.    Appearance: He is not toxic-appearing.  HENT:     Head: Normocephalic and atraumatic.  Eyes:     General: No scleral icterus.    Conjunctiva/sclera: Conjunctivae normal.  Cardiovascular:     Rate and Rhythm: Normal rate and regular rhythm.     Pulses: Normal pulses.     Heart sounds: Normal heart sounds.  Pulmonary:     Effort: Pulmonary effort is normal. No respiratory distress.     Breath sounds: Normal breath sounds.  Abdominal:     General: Abdomen is flat. Bowel sounds are normal.     Palpations: Abdomen is soft.     Tenderness: There is no abdominal tenderness.  Musculoskeletal:     Right lower leg: No edema.     Left lower leg: No edema.  Skin:    General: Skin is warm and dry.     Findings: No lesion.  Neurological:     General: No focal deficit present.     Mental Status: He is alert and oriented to person, place, and time. Mental status is at baseline.     (all labs ordered are listed, but only abnormal results are displayed) Labs Reviewed - No data to display  EKG: None  Radiology: No results found.   Procedures   Medications Ordered in the ED - No data to display                                  Medical Decision Making  This patient presents to the ED for concern of follow-up, this involves an extensive number of treatment options, and is a complaint that carries with it a high risk of complications and morbidity.  The differential diagnosis includes medication side effect, need for refill, right knee pain, DVT, fracture   Problem List / ED Course / Critical interventions / Medication management  Reports for follow-up visit.  He was recently seen in emergency room 12/20/2023 and started on postexposure prophylaxis.  He is taking Biktarvy  without complication and has  not missed any doses.  He still has approximately 10 more days left of this medicine.  He will need to follow-up in approximately 4 weeks with primary care for repeat testing.  He has no further questions on this.  Also is wondering if he can be cleared to return to work.  This needs to be done on outpatient basis with primary care.  He is hemodynamically stable and well-appearing.  He has no new complaints feel stable for discharge with outpatient follow-up         Final diagnoses:  Encounter for follow-up    ED Discharge Orders     None  Leroy Warren SAILOR, PA-C 01/03/24 1459    Leroy Jayson LABOR, DO 01/05/24 [phone]
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
